# Patient Record
Sex: Female | Born: 1950 | Race: White | Hispanic: No | Marital: Single | Smoking: Former smoker
Health system: Southern US, Community
[De-identification: ages and names within clinical notes are randomized; demographics above are authoritative.]

## PROBLEM LIST (undated history)

## (undated) DIAGNOSIS — K219 Gastro-esophageal reflux disease without esophagitis: Secondary | ICD-10-CM

## (undated) DIAGNOSIS — G8929 Other chronic pain: Secondary | ICD-10-CM

## (undated) DIAGNOSIS — G43909 Migraine, unspecified, not intractable, without status migrainosus: Secondary | ICD-10-CM

## (undated) DIAGNOSIS — M1990 Unspecified osteoarthritis, unspecified site: Secondary | ICD-10-CM

## (undated) DIAGNOSIS — E7800 Pure hypercholesterolemia, unspecified: Secondary | ICD-10-CM

## (undated) DIAGNOSIS — M549 Dorsalgia, unspecified: Secondary | ICD-10-CM

## (undated) DIAGNOSIS — I219 Acute myocardial infarction, unspecified: Secondary | ICD-10-CM

## (undated) DIAGNOSIS — I639 Cerebral infarction, unspecified: Secondary | ICD-10-CM

## (undated) DIAGNOSIS — I509 Heart failure, unspecified: Secondary | ICD-10-CM

## (undated) DIAGNOSIS — F419 Anxiety disorder, unspecified: Secondary | ICD-10-CM

## (undated) DIAGNOSIS — J449 Chronic obstructive pulmonary disease, unspecified: Secondary | ICD-10-CM

## (undated) DIAGNOSIS — I10 Essential (primary) hypertension: Secondary | ICD-10-CM

## (undated) DIAGNOSIS — E119 Type 2 diabetes mellitus without complications: Secondary | ICD-10-CM

## (undated) DIAGNOSIS — N189 Chronic kidney disease, unspecified: Secondary | ICD-10-CM

## (undated) DIAGNOSIS — I2510 Atherosclerotic heart disease of native coronary artery without angina pectoris: Secondary | ICD-10-CM

## (undated) DIAGNOSIS — F329 Major depressive disorder, single episode, unspecified: Secondary | ICD-10-CM

## (undated) DIAGNOSIS — F32A Depression, unspecified: Secondary | ICD-10-CM

## (undated) HISTORY — PX: FOOT SURGERY: SHX648

## (undated) HISTORY — DX: Essential (primary) hypertension: I10

## (undated) HISTORY — PX: ABDOMINAL HYSTERECTOMY: SHX81

## (undated) HISTORY — DX: Major depressive disorder, single episode, unspecified: F32.9

## (undated) HISTORY — PX: CHOLECYSTECTOMY: SHX55

## (undated) HISTORY — PX: BREAST SURGERY: SHX581

## (undated) HISTORY — DX: Other chronic pain: G89.29

## (undated) HISTORY — DX: Chronic obstructive pulmonary disease, unspecified: J44.9

## (undated) HISTORY — PX: OTHER SURGICAL HISTORY: SHX169

## (undated) HISTORY — DX: Depression, unspecified: F32.A

## (undated) HISTORY — DX: Anxiety disorder, unspecified: F41.9

## (undated) HISTORY — DX: Unspecified osteoarthritis, unspecified site: M19.90

## (undated) HISTORY — DX: Dorsalgia, unspecified: M54.9

---

## 1997-08-18 ENCOUNTER — Inpatient Hospital Stay (HOSPITAL_COMMUNITY): Admission: AD | Admit: 1997-08-18 | Discharge: 1997-08-19 | Payer: Self-pay | Admitting: Cardiology

## 2000-08-04 ENCOUNTER — Emergency Department (HOSPITAL_COMMUNITY): Admission: EM | Admit: 2000-08-04 | Discharge: 2000-08-04 | Payer: Self-pay | Admitting: *Deleted

## 2000-08-04 ENCOUNTER — Encounter: Payer: Self-pay | Admitting: *Deleted

## 2000-09-20 ENCOUNTER — Encounter: Payer: Self-pay | Admitting: General Surgery

## 2000-09-20 ENCOUNTER — Ambulatory Visit (HOSPITAL_COMMUNITY): Admission: RE | Admit: 2000-09-20 | Discharge: 2000-09-20 | Payer: Self-pay | Admitting: General Surgery

## 2001-08-02 ENCOUNTER — Ambulatory Visit (HOSPITAL_COMMUNITY): Admission: RE | Admit: 2001-08-02 | Discharge: 2001-08-02 | Payer: Self-pay | Admitting: Family Medicine

## 2001-08-02 ENCOUNTER — Encounter: Payer: Self-pay | Admitting: Family Medicine

## 2001-08-16 ENCOUNTER — Ambulatory Visit (HOSPITAL_COMMUNITY): Admission: RE | Admit: 2001-08-16 | Discharge: 2001-08-16 | Payer: Self-pay | Admitting: Family Medicine

## 2001-08-31 ENCOUNTER — Emergency Department (HOSPITAL_COMMUNITY): Admission: EM | Admit: 2001-08-31 | Discharge: 2001-08-31 | Payer: Self-pay | Admitting: Emergency Medicine

## 2001-08-31 ENCOUNTER — Encounter: Payer: Self-pay | Admitting: Emergency Medicine

## 2014-07-01 ENCOUNTER — Encounter (HOSPITAL_COMMUNITY): Payer: Self-pay | Admitting: Psychiatry

## 2014-07-01 ENCOUNTER — Ambulatory Visit (INDEPENDENT_AMBULATORY_CARE_PROVIDER_SITE_OTHER): Payer: MEDICAID | Admitting: Psychiatry

## 2014-07-01 VITALS — BP 126/94 | HR 68 | Ht 61.5 in | Wt 177.6 lb

## 2014-07-01 DIAGNOSIS — F4310 Post-traumatic stress disorder, unspecified: Secondary | ICD-10-CM

## 2014-07-01 DIAGNOSIS — F321 Major depressive disorder, single episode, moderate: Secondary | ICD-10-CM

## 2014-07-01 DIAGNOSIS — F332 Major depressive disorder, recurrent severe without psychotic features: Secondary | ICD-10-CM

## 2014-07-01 DIAGNOSIS — F329 Major depressive disorder, single episode, unspecified: Secondary | ICD-10-CM | POA: Insufficient documentation

## 2014-07-01 MED ORDER — DULOXETINE HCL 60 MG PO CPEP: 60.0000 mg | ORAL_CAPSULE | Freq: Every day | ORAL | Status: DC

## 2014-07-01 MED ORDER — ALPRAZOLAM 1 MG PO TABS: 1.0000 mg | ORAL_TABLET | Freq: Two times a day (BID) | ORAL | Status: DC

## 2014-07-01 MED ORDER — TRAZODONE HCL 100 MG PO TABS: 100.0000 mg | ORAL_TABLET | Freq: Every day | ORAL | Status: DC

## 2014-07-01 MED ORDER — ARIPIPRAZOLE 10 MG PO TABS: 10.0000 mg | ORAL_TABLET | Freq: Every day | ORAL | Status: DC
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
## 2014-07-28 ENCOUNTER — Ambulatory Visit (HOSPITAL_COMMUNITY): Payer: Self-pay | Admitting: Psychiatry

## 2014-07-29 ENCOUNTER — Ambulatory Visit (INDEPENDENT_AMBULATORY_CARE_PROVIDER_SITE_OTHER): Payer: Medicare Other | Admitting: Psychiatry

## 2014-07-29 ENCOUNTER — Encounter (HOSPITAL_COMMUNITY): Payer: Self-pay | Admitting: Psychiatry

## 2014-07-29 VITALS — BP 114/77 | HR 75 | Ht 61.5 in | Wt 181.0 lb

## 2014-07-29 DIAGNOSIS — F4310 Post-traumatic stress disorder, unspecified: Secondary | ICD-10-CM

## 2014-07-29 DIAGNOSIS — F332 Major depressive disorder, recurrent severe without psychotic features: Secondary | ICD-10-CM

## 2014-07-29 DIAGNOSIS — F321 Major depressive disorder, single episode, moderate: Secondary | ICD-10-CM

## 2014-07-29 MED ORDER — PRAZOSIN HCL 5 MG PO CAPS: 5.0000 mg | ORAL_CAPSULE | Freq: Every day | ORAL | Status: DC
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
## 2014-08-12 ENCOUNTER — Ambulatory Visit (HOSPITAL_COMMUNITY): Payer: Self-pay | Admitting: Psychiatry

## 2014-08-13 ENCOUNTER — Encounter (HOSPITAL_COMMUNITY): Payer: Self-pay | Admitting: *Deleted

## 2014-08-26 ENCOUNTER — Encounter (HOSPITAL_COMMUNITY): Payer: Self-pay | Admitting: Psychiatry

## 2014-08-26 ENCOUNTER — Ambulatory Visit (INDEPENDENT_AMBULATORY_CARE_PROVIDER_SITE_OTHER): Payer: MEDICAID | Admitting: Psychiatry

## 2014-08-26 VITALS — BP 106/74 | HR 49 | Ht 61.5 in | Wt 177.8 lb

## 2014-08-26 DIAGNOSIS — F321 Major depressive disorder, single episode, moderate: Secondary | ICD-10-CM

## 2014-08-26 DIAGNOSIS — F332 Major depressive disorder, recurrent severe without psychotic features: Secondary | ICD-10-CM

## 2014-08-26 DIAGNOSIS — F4310 Post-traumatic stress disorder, unspecified: Secondary | ICD-10-CM

## 2014-08-26 MED ORDER — FLUOXETINE HCL 20 MG PO CAPS: 20.0000 mg | ORAL_CAPSULE | Freq: Every day | ORAL | Status: DC

## 2014-08-26 MED ORDER — ARIPIPRAZOLE 10 MG PO TABS: 10.0000 mg | ORAL_TABLET | Freq: Every day | ORAL | Status: DC

## 2014-08-26 MED ORDER — ALPRAZOLAM 1 MG PO TABS: 1.0000 mg | ORAL_TABLET | Freq: Two times a day (BID) | ORAL | Status: DC

## 2014-08-26 MED ORDER — PRAZOSIN HCL 2 MG PO CAPS: 2.0000 mg | ORAL_CAPSULE | Freq: Every day | ORAL | Status: DC

## 2014-08-26 MED ORDER — TRAZODONE HCL 100 MG PO TABS: 100.0000 mg | ORAL_TABLET | Freq: Every day | ORAL | Status: DC
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
## 2014-09-03 ENCOUNTER — Ambulatory Visit (HOSPITAL_COMMUNITY): Payer: Self-pay | Admitting: Psychiatry

## 2014-09-14 ENCOUNTER — Ambulatory Visit (HOSPITAL_COMMUNITY): Payer: Self-pay | Admitting: Psychiatry

## 2014-09-23 ENCOUNTER — Ambulatory Visit (HOSPITAL_COMMUNITY): Payer: Self-pay | Admitting: Psychiatry

## 2014-10-01 ENCOUNTER — Ambulatory Visit (INDEPENDENT_AMBULATORY_CARE_PROVIDER_SITE_OTHER): Payer: Medicare Other | Admitting: Psychiatry

## 2014-10-01 ENCOUNTER — Encounter (HOSPITAL_COMMUNITY): Payer: Self-pay | Admitting: Psychiatry

## 2014-10-01 VITALS — BP 92/64 | Ht 61.0 in | Wt 177.0 lb

## 2014-10-01 DIAGNOSIS — F4310 Post-traumatic stress disorder, unspecified: Secondary | ICD-10-CM | POA: Diagnosis not present

## 2014-10-01 DIAGNOSIS — F321 Major depressive disorder, single episode, moderate: Secondary | ICD-10-CM

## 2014-10-01 DIAGNOSIS — F332 Major depressive disorder, recurrent severe without psychotic features: Secondary | ICD-10-CM

## 2014-10-01 MED ORDER — FLUOXETINE HCL 20 MG PO CAPS: 20.0000 mg | ORAL_CAPSULE | Freq: Every day | ORAL | Status: DC

## 2014-10-01 MED ORDER — ALPRAZOLAM 1 MG PO TABS: 1.0000 mg | ORAL_TABLET | Freq: Two times a day (BID) | ORAL | Status: DC

## 2014-10-01 MED ORDER — PRAZOSIN HCL 2 MG PO CAPS: 2.0000 mg | ORAL_CAPSULE | Freq: Every day | ORAL | Status: DC

## 2014-10-01 MED ORDER — ARIPIPRAZOLE 10 MG PO TABS: 10.0000 mg | ORAL_TABLET | Freq: Every day | ORAL | Status: DC

## 2014-10-01 MED ORDER — TRAZODONE HCL 100 MG PO TABS: 100.0000 mg | ORAL_TABLET | Freq: Every day | ORAL | Status: DC
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
## 2014-12-01 ENCOUNTER — Ambulatory Visit (INDEPENDENT_AMBULATORY_CARE_PROVIDER_SITE_OTHER): Payer: Medicare Other | Admitting: Psychiatry

## 2014-12-01 ENCOUNTER — Encounter (HOSPITAL_COMMUNITY): Payer: Self-pay | Admitting: Psychiatry

## 2014-12-01 VITALS — BP 113/86 | HR 68 | Ht 61.0 in | Wt 174.4 lb

## 2014-12-01 DIAGNOSIS — F4310 Post-traumatic stress disorder, unspecified: Secondary | ICD-10-CM | POA: Diagnosis not present

## 2014-12-01 DIAGNOSIS — F332 Major depressive disorder, recurrent severe without psychotic features: Secondary | ICD-10-CM

## 2014-12-01 DIAGNOSIS — F321 Major depressive disorder, single episode, moderate: Secondary | ICD-10-CM

## 2014-12-01 MED ORDER — TRAZODONE HCL 100 MG PO TABS: 100.0000 mg | ORAL_TABLET | Freq: Every day | ORAL | Status: DC

## 2014-12-01 MED ORDER — ALPRAZOLAM 1 MG PO TABS: 1.0000 mg | ORAL_TABLET | Freq: Two times a day (BID) | ORAL | Status: DC

## 2014-12-01 MED ORDER — PRAZOSIN HCL 2 MG PO CAPS: 2.0000 mg | ORAL_CAPSULE | Freq: Every day | ORAL | Status: DC

## 2014-12-01 MED ORDER — FLUOXETINE HCL 20 MG PO CAPS: 20.0000 mg | ORAL_CAPSULE | Freq: Every day | ORAL | Status: DC

## 2014-12-01 MED ORDER — ARIPIPRAZOLE 10 MG PO TABS: 10.0000 mg | ORAL_TABLET | Freq: Every day | ORAL | Status: DC
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
## 2015-02-16 ENCOUNTER — Encounter (HOSPITAL_COMMUNITY): Payer: Self-pay | Admitting: Psychiatry

## 2015-02-16 ENCOUNTER — Ambulatory Visit (INDEPENDENT_AMBULATORY_CARE_PROVIDER_SITE_OTHER): Payer: Medicare Other | Admitting: Psychiatry

## 2015-02-16 VITALS — BP 157/87 | HR 65 | Ht 61.0 in | Wt 171.4 lb

## 2015-02-16 DIAGNOSIS — F321 Major depressive disorder, single episode, moderate: Secondary | ICD-10-CM

## 2015-02-16 DIAGNOSIS — F4310 Post-traumatic stress disorder, unspecified: Secondary | ICD-10-CM

## 2015-02-16 MED ORDER — PRAZOSIN HCL 2 MG PO CAPS: 2.0000 mg | ORAL_CAPSULE | Freq: Every day | ORAL | Status: DC

## 2015-02-16 MED ORDER — ARIPIPRAZOLE 10 MG PO TABS: 10.0000 mg | ORAL_TABLET | Freq: Every day | ORAL | Status: DC

## 2015-02-16 MED ORDER — TRAZODONE HCL 100 MG PO TABS: 100.0000 mg | ORAL_TABLET | Freq: Every day | ORAL | Status: DC

## 2015-02-16 MED ORDER — FLUOXETINE HCL 20 MG PO CAPS: 20.0000 mg | ORAL_CAPSULE | Freq: Every day | ORAL | Status: DC

## 2015-02-16 MED ORDER — ALPRAZOLAM 1 MG PO TABS: 1.0000 mg | ORAL_TABLET | Freq: Two times a day (BID) | ORAL | Status: DC
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
## 2015-02-24 ENCOUNTER — Ambulatory Visit (HOSPITAL_COMMUNITY): Payer: Self-pay | Admitting: Psychiatry

## 2015-02-24 ENCOUNTER — Other Ambulatory Visit: Payer: Self-pay | Admitting: Urology

## 2015-02-24 ENCOUNTER — Ambulatory Visit (INDEPENDENT_AMBULATORY_CARE_PROVIDER_SITE_OTHER): Payer: Medicare Other | Admitting: Urology

## 2015-02-24 DIAGNOSIS — R32 Unspecified urinary incontinence: Secondary | ICD-10-CM

## 2015-02-24 DIAGNOSIS — N3281 Overactive bladder: Secondary | ICD-10-CM

## 2015-03-01 ENCOUNTER — Emergency Department (HOSPITAL_COMMUNITY): Payer: Medicare Other

## 2015-03-01 ENCOUNTER — Encounter (HOSPITAL_COMMUNITY): Payer: Self-pay | Admitting: Emergency Medicine

## 2015-03-01 ENCOUNTER — Emergency Department (HOSPITAL_COMMUNITY)
Admission: EM | Admit: 2015-03-01 | Discharge: 2015-03-01 | Disposition: A | Discharge status: Home / Self Care | Payer: Medicare Other | Attending: Emergency Medicine | Admitting: Emergency Medicine

## 2015-03-01 DIAGNOSIS — M1990 Unspecified osteoarthritis, unspecified site: Secondary | ICD-10-CM | POA: Insufficient documentation

## 2015-03-01 DIAGNOSIS — M545 Low back pain: Secondary | ICD-10-CM | POA: Insufficient documentation

## 2015-03-01 DIAGNOSIS — R350 Frequency of micturition: Secondary | ICD-10-CM | POA: Insufficient documentation

## 2015-03-01 DIAGNOSIS — J449 Chronic obstructive pulmonary disease, unspecified: Secondary | ICD-10-CM | POA: Diagnosis not present

## 2015-03-01 DIAGNOSIS — F419 Anxiety disorder, unspecified: Secondary | ICD-10-CM | POA: Diagnosis not present

## 2015-03-01 DIAGNOSIS — F17210 Nicotine dependence, cigarettes, uncomplicated: Secondary | ICD-10-CM | POA: Diagnosis not present

## 2015-03-01 DIAGNOSIS — R1032 Left lower quadrant pain: Secondary | ICD-10-CM | POA: Insufficient documentation

## 2015-03-01 DIAGNOSIS — R1030 Lower abdominal pain, unspecified: Secondary | ICD-10-CM

## 2015-03-01 DIAGNOSIS — Z79899 Other long term (current) drug therapy: Secondary | ICD-10-CM | POA: Insufficient documentation

## 2015-03-01 DIAGNOSIS — I10 Essential (primary) hypertension: Secondary | ICD-10-CM | POA: Diagnosis not present

## 2015-03-01 DIAGNOSIS — G8929 Other chronic pain: Secondary | ICD-10-CM | POA: Diagnosis not present

## 2015-03-01 DIAGNOSIS — Z880 Allergy status to penicillin: Secondary | ICD-10-CM | POA: Diagnosis not present

## 2015-03-01 DIAGNOSIS — R109 Unspecified abdominal pain: Secondary | ICD-10-CM

## 2015-03-01 DIAGNOSIS — F329 Major depressive disorder, single episode, unspecified: Secondary | ICD-10-CM | POA: Insufficient documentation

## 2015-03-01 DIAGNOSIS — R39198 Other difficulties with micturition: Secondary | ICD-10-CM | POA: Insufficient documentation

## 2015-03-01 LAB — URINALYSIS, ROUTINE W REFLEX MICROSCOPIC
BILIRUBIN URINE: NEGATIVE
GLUCOSE, UA: NEGATIVE mg/dL
Ketones, ur: NEGATIVE mg/dL
Leukocytes, UA: NEGATIVE
Nitrite: NEGATIVE
PROTEIN: NEGATIVE mg/dL
Specific Gravity, Urine: 1.02 (ref 1.005–1.030)
pH: 6 (ref 5.0–8.0)

## 2015-03-01 LAB — URINE MICROSCOPIC-ADD ON

## 2015-03-01 LAB — I-STAT CHEM 8, ED
BUN: 13 mg/dL (ref 6–20)
CALCIUM ION: 1.08 mmol/L — AB (ref 1.13–1.30)
Chloride: 100 mmol/L — ABNORMAL LOW (ref 101–111)
Creatinine, Ser: 0.9 mg/dL (ref 0.44–1.00)
GLUCOSE: 127 mg/dL — AB (ref 65–99)
HCT: 46 % (ref 36.0–46.0)
HEMOGLOBIN: 15.6 g/dL — AB (ref 12.0–15.0)
Potassium: 4 mmol/L (ref 3.5–5.1)
Sodium: 140 mmol/L (ref 135–145)
TCO2: 29 mmol/L (ref 0–100)

## 2015-03-01 LAB — I-STAT CG4 LACTIC ACID, ED: LACTIC ACID, VENOUS: 1.54 mmol/L (ref 0.5–2.0)

## 2015-03-01 MED ORDER — OXYCODONE-ACETAMINOPHEN 5-325 MG PO TABS
1.0000 | ORAL_TABLET | Freq: Once | ORAL | Status: AC
  Administered 2015-03-01: 1 via ORAL
  Filled 2015-03-01: qty 1

## 2015-03-01 MED ORDER — ONDANSETRON 8 MG PO TBDP
8.0000 mg | ORAL_TABLET | Freq: Once | ORAL | Status: AC
  Administered 2015-03-01: 8 mg via ORAL
  Filled 2015-03-01: qty 1

## 2015-03-01 NOTE — ED Provider Notes (Signed)
CSN: [id number]     Arrival date & time 03/01/15  1616 History   First MD Initiated Contact with Patient 03/01/15 1628     Chief Complaint  Patient presents with  . Urinary Frequency    Patient is a 64 y.o. female presenting with frequency. The history is provided by the patient and a relative.  Urinary Frequency This is a new problem. The current episode started more than 2 days ago. The problem occurs daily. The problem has been gradually worsening. Associated symptoms include abdominal pain. Pertinent negatives include no chest pain. Nothing aggravates the symptoms. Nothing relieves the symptoms.  Pt reports recent difficulty urinating over past several weeks  She initially had issues with large quantities of urine, and her PCP referred her to urology She saw urology on 11/23 She had in/out cath per patient which she describes as "difficult" but was able to produce urine.  She was referred for cystoscopy on 11/30 Starting on thanksgiving (11/24) she started having low abd pain and back pain She describes difficulty urinating and only producing small amounts of urine No fever/vomiting but reports nausea   Past Medical History  Diagnosis Date  . COPD (chronic obstructive pulmonary disease) (HCC)   . Arthritis   . Hypertension   . Depression   . Anxiety   . Chronic back pain    Past Surgical History  Procedure Laterality Date  . Abdominal hysterectomy    . Cholecystectomy    . Breast lump removed    . Breast surgery    . Foot surgery     Family History  Problem Relation Age of Onset  . Bipolar disorder Daughter    Social History  Substance Use Topics  . Smoking status: Current Every Day Smoker -- 1.00 packs/day    Types: Cigarettes  . Smokeless tobacco: None  . Alcohol Use: No   OB History    No data available     Review of Systems  Constitutional: Positive for fatigue. Negative for fever.  Cardiovascular: Negative for chest pain.  Gastrointestinal: Positive  for abdominal pain. Negative for vomiting.  Genitourinary: Positive for frequency, flank pain, decreased urine volume and difficulty urinating.  All other systems reviewed and are negative.     Allergies  Penicillins  Home Medications   Prior to Admission medications   Medication Sig Start Date End Date Taking? Authorizing Provider  albuterol (PROVENTIL HFA;VENTOLIN HFA) 108 (90 BASE) MCG/ACT inhaler Inhale 2 puffs into the lungs 2 (two) times daily.    Historical Provider, MD  ALPRAZolam Prudy Feeler) 1 MG tablet Take 1 tablet (1 mg total) by mouth 2 (two) times daily. 02/16/15   Myrlene Broker, MD  ARIPiprazole (ABILIFY) 10 MG tablet Take 1 tablet (10 mg total) by mouth daily. 02/16/15   Myrlene Broker, MD  FLUoxetine (PROZAC) 20 MG capsule Take 1 capsule (20 mg total) by mouth daily. 02/16/15 02/16/16  Myrlene Broker, MD  FLUTICASONE FUROATE-VILANTEROL IN Inhale into the lungs.    Historical Provider, MD  metoprolol succinate (TOPROL-XL) 50 MG 24 hr tablet Take 50 mg by mouth daily. Take with or immediately following a meal.    Historical Provider, MD  omeprazole (PRILOSEC) 40 MG capsule Take 40 mg by mouth daily.    Historical Provider, MD  oxyCODONE-acetaminophen (PERCOCET) 10-325 MG per tablet Take 1 tablet by mouth every 8 (eight) hours as needed for pain.    Historical Provider, MD  potassium chloride SA (K-DUR,KLOR-CON) 20 MEQ tablet Take 20  mEq by mouth 2 (two) times daily.    Historical Provider, MD  pravastatin (PRAVACHOL) 80 MG tablet Take 80 mg by mouth daily.    Historical Provider, MD  prazosin (MINIPRESS) 2 MG capsule Take 1 capsule (2 mg total) by mouth at bedtime. 02/16/15   Myrlene Broker, MD  Tiotropium Bromide Monohydrate 2.5 MCG/ACT AERS Inhale 2 puffs into the lungs daily.    Historical Provider, MD  traZODone (DESYREL) 100 MG tablet Take 1 tablet (100 mg total) by mouth at bedtime. 02/16/15   Myrlene Broker, MD   BP 196/83 mmHg  Pulse 70  Temp(Src) 98 F (36.7 C)  (Oral)  Resp 16  Ht  (1.549 m)  Wt 77.111 kg  BMI 32.14 kg/m2  SpO2 100% Physical Exam CONSTITUTIONAL: Well developed/well nourished HEAD: Normocephalic/atraumatic EYES: EOMI ENMT: Mucous membranes moist NECK: supple no meningeal signs SPINE/BACK:entire spine nontender CV: S1/S2 noted, no murmurs/rubs/gallops noted LUNGS: Lungs are clear to auscultation bilaterally, no apparent distress ABDOMEN: soft, moderate suprapubic tenderness, LLQ tenderness, no rebound or guarding ZO:[id number] cva tenderness NEURO: Pt is awake/alert/appropriate, moves all extremitiesx4.  No facial droop.   EXTREMITIES: pulses normal/equal, full ROM SKIN: warm, color normal PSYCH: no abnormalities of mood noted, alert and oriented to situation  ED Course  Procedures  5:35 PM Family/patient concerned for possible sepsis and infection Labs/urinalysis/bladder scan ordered 7:00 PM Pt with around of urine in bladder She reports difficulty passing urine Foley placed by nursing with passage of urine, but still having diffuse low abd pain and flank pain Will search for other causes of pain 8:09 PM Pt appears improved Urine not c/w UTI CT imaging negative She would like to go home She feels symptoms improved after foley placement After discussion with patient/family, she will keep foley in place She scheduled to see urology in 48 hours I feel this is reasonable to keep catheter in place  Labs Review Labs Reviewed  URINALYSIS, ROUTINE W REFLEX MICROSCOPIC (NOT AT [facility]) - Abnormal; Notable for the following:    Hgb urine dipstick SMALL (*)    All other components within normal limits  URINE MICROSCOPIC-ADD ON - Abnormal; Notable for the following:    Squamous Epithelial / LPF 0-5 (*)    Bacteria, UA RARE (*)    All other components within normal limits  I-STAT CHEM 8, ED - Abnormal; Notable for the following:    Chloride 100 (*)    Glucose, Bld 127 (*)    Calcium, Ion 1.08 (*)     Hemoglobin 15.6 (*)    All other components within normal limits  URINE CULTURE  I-STAT CG4 LACTIC ACID, ED    Imaging Review Ct Renal Stone Study  03/01/2015  CLINICAL DATA:  Urinary frequency and decreased urine output. EXAM: CT ABDOMEN AND PELVIS WITHOUT CONTRAST TECHNIQUE: Multidetector CT imaging of the abdomen and pelvis was performed following the standard protocol without IV contrast. COMPARISON:  09/10/2010. FINDINGS: Lower chest:  No acute findings. Hepatobiliary: Cholecystectomy clips. Normal non contrasted appearance of the liver. Pancreas: No mass or inflammatory process identified on this un-enhanced exam. Spleen: Within normal limits in size. Adrenals/Urinary Tract: Foley catheter in urinary bladder with no urine in the bladder. Normal non contrasted appearance of the kidneys and ureters. No urinary tract calculi or hydronephrosis. Stomach/Bowel: Mild sigmoid and right colon diverticulosis without evidence of diverticulitis. No gastric or small bowel abnormalities. Normal appearing appendix. Vascular/Lymphatic: Atheromatous arterial calcifications. No enlarged lymph nodes. Reproductive: Surgically absent uterus.  No adnexal masses. Other: None. Musculoskeletal: Lower thoracic spine degenerative changes and minimal lumbar spine degenerative changes. IMPRESSION: No acute abnormality. Electronically Signed   By: Beckie Salts M.D.   On: 03/01/2015 19:42   I have personally reviewed and evaluated these  lab results as part of my medical decision-making.   Medications  oxyCODONE-acetaminophen (PERCOCET/ROXICET) 5-325 MG per tablet 1 tablet (1 tablet Oral Given 03/01/15 1837)  ondansetron (ZOFRAN-ODT) disintegrating tablet 8 mg (8 mg Oral Given 03/01/15 1837)    MDM   Final diagnoses:  Urinary frequency  Lower abdominal pain    Nursing notes including past medical history and social history reviewed and considered in documentation Labs/vital reviewed myself and considered during  evaluation     Zadie Rhine, MD 03/01/15 2010

## 2015-03-01 NOTE — ED Notes (Signed)
Changed patient's foley bag to leg bag. Demonstrated instructions on use of leg bag to patient. Patient verbalized understanding.

## 2015-03-01 NOTE — Discharge Instructions (Signed)

## 2015-03-01 NOTE — ED Notes (Signed)
Pt states that she has been having urinary frequency with only a few drops coming out.  Started on 11/24.  Had in and out cath done at doctors's office but was not started on abx.

## 2015-03-01 NOTE — ED Notes (Signed)
Bladder scan showed 161 ml urine in bladder.

## 2015-03-03 ENCOUNTER — Ambulatory Visit (HOSPITAL_COMMUNITY)
Admission: RE | Admit: 2015-03-03 | Discharge: 2015-03-03 | Disposition: A | Discharge status: Home / Self Care | Payer: Medicare Other | Source: Ambulatory Visit | Attending: Urology | Admitting: Urology

## 2015-03-03 DIAGNOSIS — N39498 Other specified urinary incontinence: Secondary | ICD-10-CM | POA: Insufficient documentation

## 2015-03-03 DIAGNOSIS — R1030 Lower abdominal pain, unspecified: Secondary | ICD-10-CM | POA: Diagnosis not present

## 2015-03-03 DIAGNOSIS — M549 Dorsalgia, unspecified: Secondary | ICD-10-CM | POA: Diagnosis not present

## 2015-03-03 DIAGNOSIS — G8929 Other chronic pain: Secondary | ICD-10-CM | POA: Insufficient documentation

## 2015-03-03 DIAGNOSIS — R32 Unspecified urinary incontinence: Secondary | ICD-10-CM

## 2015-03-03 LAB — URINE CULTURE

## 2015-03-17 ENCOUNTER — Ambulatory Visit (INDEPENDENT_AMBULATORY_CARE_PROVIDER_SITE_OTHER): Payer: Medicare Other | Admitting: Urology

## 2015-03-17 DIAGNOSIS — N3281 Overactive bladder: Secondary | ICD-10-CM | POA: Diagnosis not present

## 2015-04-21 ENCOUNTER — Ambulatory Visit: Payer: Self-pay | Admitting: Urology

## 2015-05-19 ENCOUNTER — Ambulatory Visit (INDEPENDENT_AMBULATORY_CARE_PROVIDER_SITE_OTHER): Payer: 59 | Admitting: Psychiatry

## 2015-05-19 ENCOUNTER — Encounter (HOSPITAL_COMMUNITY): Payer: Self-pay | Admitting: Psychiatry

## 2015-05-19 VITALS — BP 173/93 | HR 63 | Ht 61.0 in | Wt 169.2 lb

## 2015-05-19 DIAGNOSIS — F4310 Post-traumatic stress disorder, unspecified: Secondary | ICD-10-CM

## 2015-05-19 DIAGNOSIS — F321 Major depressive disorder, single episode, moderate: Secondary | ICD-10-CM

## 2015-05-19 MED ORDER — ARIPIPRAZOLE 10 MG PO TABS: 10.0000 mg | ORAL_TABLET | Freq: Every day | ORAL | Status: DC

## 2015-05-19 MED ORDER — ALPRAZOLAM 1 MG PO TABS: 1.0000 mg | ORAL_TABLET | Freq: Two times a day (BID) | ORAL | Status: DC

## 2015-05-19 MED ORDER — PRAZOSIN HCL 2 MG PO CAPS: 2.0000 mg | ORAL_CAPSULE | Freq: Every day | ORAL | Status: DC

## 2015-05-19 MED ORDER — FLUOXETINE HCL 20 MG PO CAPS: 20.0000 mg | ORAL_CAPSULE | Freq: Every day | ORAL | Status: DC

## 2015-05-19 MED ORDER — TRAZODONE HCL 50 MG PO TABS: 50.0000 mg | ORAL_TABLET | Freq: Every day | ORAL | Status: DC
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
## 2015-05-26 ENCOUNTER — Ambulatory Visit: Payer: Self-pay | Admitting: Urology

## 2015-06-09 ENCOUNTER — Ambulatory Visit (INDEPENDENT_AMBULATORY_CARE_PROVIDER_SITE_OTHER): Payer: Medicare Other | Admitting: Urology

## 2015-06-09 DIAGNOSIS — R32 Unspecified urinary incontinence: Secondary | ICD-10-CM

## 2015-06-09 DIAGNOSIS — N3281 Overactive bladder: Secondary | ICD-10-CM

## 2015-07-14 ENCOUNTER — Ambulatory Visit (INDEPENDENT_AMBULATORY_CARE_PROVIDER_SITE_OTHER): Payer: Medicare Other | Admitting: Urology

## 2015-07-14 DIAGNOSIS — R32 Unspecified urinary incontinence: Secondary | ICD-10-CM

## 2015-07-14 DIAGNOSIS — N3281 Overactive bladder: Secondary | ICD-10-CM | POA: Diagnosis not present

## 2015-08-11 ENCOUNTER — Telehealth (HOSPITAL_COMMUNITY): Payer: Self-pay | Admitting: *Deleted

## 2015-08-11 NOTE — Telephone Encounter (Signed)
Pt pharmacy [facility] faxed refill request for pt Xanax. Pt medication last printed on 05-19-15 with 2 refills. Pt f/u appt is scheduled for 08-16-15. Pharmacy number is [phone]

## 2015-08-11 NOTE — Telephone Encounter (Signed)
You may call in one month supply 

## 2015-08-12 ENCOUNTER — Telehealth (HOSPITAL_COMMUNITY): Payer: Self-pay | Admitting: *Deleted

## 2015-08-12 MED ORDER — ALPRAZOLAM 1 MG PO TABS: 1.0000 mg | ORAL_TABLET | Freq: Two times a day (BID) | ORAL | Status: DC

## 2015-08-12 NOTE — Telephone Encounter (Signed)
Called pt pharmacy and spoke with Wynona Canes the pharmacist. Informed her provider would like to approve one month of pt Xanax and she showed understanding.

## 2015-08-12 NOTE — Telephone Encounter (Addendum)
Called pt pharmacy due to fax that was received for refill request for pt Xanax. Per provider to call in one months worth into pt pharmacy. Spoke with Wynona Canes at pt pharmacy and she showed understanding

## 2015-08-16 ENCOUNTER — Encounter (HOSPITAL_COMMUNITY): Payer: Self-pay | Admitting: Psychiatry

## 2015-08-16 ENCOUNTER — Ambulatory Visit (INDEPENDENT_AMBULATORY_CARE_PROVIDER_SITE_OTHER): Payer: 59 | Admitting: Psychiatry

## 2015-08-16 VITALS — BP 160/96 | Ht 61.0 in | Wt 171.0 lb

## 2015-08-16 DIAGNOSIS — F4310 Post-traumatic stress disorder, unspecified: Secondary | ICD-10-CM

## 2015-08-16 DIAGNOSIS — F321 Major depressive disorder, single episode, moderate: Secondary | ICD-10-CM

## 2015-08-16 MED ORDER — FLUOXETINE HCL 20 MG PO CAPS: 20.0000 mg | ORAL_CAPSULE | Freq: Every day | ORAL | Status: DC

## 2015-08-16 MED ORDER — ARIPIPRAZOLE 10 MG PO TABS: 10.0000 mg | ORAL_TABLET | Freq: Every day | ORAL | Status: DC

## 2015-08-16 MED ORDER — ALPRAZOLAM 1 MG PO TABS: 1.0000 mg | ORAL_TABLET | Freq: Two times a day (BID) | ORAL | Status: DC

## 2015-08-16 MED ORDER — PRAZOSIN HCL 5 MG PO CAPS: 5.0000 mg | ORAL_CAPSULE | Freq: Every day | ORAL | Status: DC

## 2015-08-16 MED ORDER — TRAZODONE HCL 50 MG PO TABS: 50.0000 mg | ORAL_TABLET | Freq: Every day | ORAL | Status: DC
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
## 2015-09-01 ENCOUNTER — Other Ambulatory Visit (HOSPITAL_COMMUNITY): Payer: Self-pay | Admitting: Psychiatry

## 2015-09-01 ENCOUNTER — Telehealth (HOSPITAL_COMMUNITY): Payer: Self-pay | Admitting: *Deleted

## 2015-09-01 MED ORDER — PRAZOSIN HCL 2 MG PO CAPS: 2.0000 mg | ORAL_CAPSULE | Freq: Every day | ORAL | Status: DC

## 2015-09-01 NOTE — Telephone Encounter (Signed)
2 mg dose sent in

## 2015-09-01 NOTE — Telephone Encounter (Signed)
patient said her Prazosin HCL is a little too strong.  she can't wake up in the middle of the night to go to bathroom, she wets the bed.

## 2015-10-07 ENCOUNTER — Telehealth (HOSPITAL_COMMUNITY): Payer: Self-pay | Admitting: *Deleted

## 2015-10-07 NOTE — Telephone Encounter (Signed)
Office received faxed refill request from [facility] for pt alprazolam 1 mg BID. Per pt chart, medication was last printed on 08-16-15 with 2 refills. [facility] pharmacy fax number is [phone] and phone number is [phone].

## 2015-10-08 NOTE — Telephone Encounter (Signed)
Please call them and see if pt provided the script to them. If not, you may call it in

## 2015-10-08 NOTE — Telephone Encounter (Signed)
Called pt to see if she sent her Xanax script to her pharmacy and she stated yes she did. Asked pt if she can remember if when she sent it to her pharmacy and she stated she can not remember.

## 2015-10-15 ENCOUNTER — Ambulatory Visit (HOSPITAL_COMMUNITY): Payer: Self-pay | Admitting: Psychiatry

## 2015-10-18 ENCOUNTER — Encounter (HOSPITAL_COMMUNITY): Payer: Self-pay | Admitting: Psychiatry

## 2015-10-18 ENCOUNTER — Ambulatory Visit (INDEPENDENT_AMBULATORY_CARE_PROVIDER_SITE_OTHER): Payer: 59 | Admitting: Psychiatry

## 2015-10-18 VITALS — BP 168/96 | HR 67 | Ht 61.0 in | Wt 165.8 lb

## 2015-10-18 DIAGNOSIS — F321 Major depressive disorder, single episode, moderate: Secondary | ICD-10-CM

## 2015-10-18 MED ORDER — ALPRAZOLAM 1 MG PO TABS: 1.0000 mg | ORAL_TABLET | Freq: Two times a day (BID) | ORAL | Status: DC

## 2015-10-18 MED ORDER — ARIPIPRAZOLE 10 MG PO TABS: 10.0000 mg | ORAL_TABLET | Freq: Every day | ORAL | Status: DC

## 2015-10-18 MED ORDER — PRAZOSIN HCL 2 MG PO CAPS: 2.0000 mg | ORAL_CAPSULE | Freq: Every day | ORAL | Status: DC

## 2015-10-18 MED ORDER — TRAZODONE HCL 50 MG PO TABS: 50.0000 mg | ORAL_TABLET | Freq: Every day | ORAL | Status: DC

## 2015-10-18 MED ORDER — FLUOXETINE HCL 20 MG PO CAPS: 20.0000 mg | ORAL_CAPSULE | Freq: Every day | ORAL | Status: DC
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
## 2016-01-18 ENCOUNTER — Encounter (HOSPITAL_COMMUNITY): Payer: Self-pay | Admitting: Psychiatry

## 2016-01-18 ENCOUNTER — Ambulatory Visit (INDEPENDENT_AMBULATORY_CARE_PROVIDER_SITE_OTHER): Payer: 59 | Admitting: Psychiatry

## 2016-01-18 VITALS — BP 142/68 | HR 46 | Ht 61.0 in | Wt 165.8 lb

## 2016-01-18 DIAGNOSIS — Z818 Family history of other mental and behavioral disorders: Secondary | ICD-10-CM

## 2016-01-18 DIAGNOSIS — F321 Major depressive disorder, single episode, moderate: Secondary | ICD-10-CM

## 2016-01-18 DIAGNOSIS — F4310 Post-traumatic stress disorder, unspecified: Secondary | ICD-10-CM

## 2016-01-18 MED ORDER — PRAZOSIN HCL 2 MG PO CAPS: 2.0000 mg | ORAL_CAPSULE | Freq: Every day | ORAL | 2 refills | Status: DC

## 2016-01-18 MED ORDER — TRAZODONE HCL 50 MG PO TABS: 50.0000 mg | ORAL_TABLET | Freq: Every day | ORAL | 2 refills | Status: DC

## 2016-01-18 MED ORDER — ALPRAZOLAM 1 MG PO TABS: 1.0000 mg | ORAL_TABLET | Freq: Two times a day (BID) | ORAL | 2 refills | Status: DC

## 2016-01-18 MED ORDER — ARIPIPRAZOLE 10 MG PO TABS: 10.0000 mg | ORAL_TABLET | Freq: Every day | ORAL | 2 refills | Status: DC

## 2016-01-18 MED ORDER — FLUOXETINE HCL 20 MG PO CAPS: 20.0000 mg | ORAL_CAPSULE | Freq: Every day | ORAL | 2 refills | Status: DC
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
## 2016-01-19 ENCOUNTER — Telehealth (HOSPITAL_COMMUNITY): Payer: Self-pay | Admitting: *Deleted

## 2016-01-19 NOTE — Telephone Encounter (Signed)
Called Dr Sherryll Burger office at [facility] per Dr. Tenny Craw to inform them of pt HR. Spoke with Marcelino Duster which stated she will relay the message to Dr.Shah and his nurse. Informed Marcelino Duster that Dr. Tenny Craw wanted Dr. Sherryll Burger to be aware that pt HR was 45 when she came to office yesterday for f/u. Marcelino Duster verbalized understanding.

## 2016-04-06 ENCOUNTER — Ambulatory Visit (HOSPITAL_COMMUNITY)
Admission: RE | Admit: 2016-04-06 | Discharge: 2016-04-06 | Disposition: A | Discharge status: Home / Self Care | Payer: Medicare Other | Source: Ambulatory Visit | Attending: Neurology | Admitting: Neurology

## 2016-04-06 ENCOUNTER — Other Ambulatory Visit (HOSPITAL_COMMUNITY): Payer: Self-pay | Admitting: Neurology

## 2016-04-06 DIAGNOSIS — M542 Cervicalgia: Secondary | ICD-10-CM | POA: Diagnosis present

## 2016-04-06 DIAGNOSIS — M545 Low back pain: Secondary | ICD-10-CM

## 2016-04-19 ENCOUNTER — Ambulatory Visit (HOSPITAL_COMMUNITY): Payer: Self-pay | Admitting: Psychiatry

## 2016-05-01 ENCOUNTER — Encounter (HOSPITAL_COMMUNITY): Payer: Self-pay | Admitting: Psychiatry

## 2016-05-01 ENCOUNTER — Telehealth (HOSPITAL_COMMUNITY): Payer: Self-pay | Admitting: *Deleted

## 2016-05-01 ENCOUNTER — Ambulatory Visit (INDEPENDENT_AMBULATORY_CARE_PROVIDER_SITE_OTHER): Payer: 59 | Admitting: Psychiatry

## 2016-05-01 VITALS — BP 142/93 | HR 50 | Ht 61.0 in | Wt 169.6 lb

## 2016-05-01 DIAGNOSIS — Z79899 Other long term (current) drug therapy: Secondary | ICD-10-CM | POA: Diagnosis not present

## 2016-05-01 DIAGNOSIS — F4310 Post-traumatic stress disorder, unspecified: Secondary | ICD-10-CM | POA: Diagnosis not present

## 2016-05-01 DIAGNOSIS — Z880 Allergy status to penicillin: Secondary | ICD-10-CM

## 2016-05-01 DIAGNOSIS — F321 Major depressive disorder, single episode, moderate: Secondary | ICD-10-CM

## 2016-05-01 DIAGNOSIS — Z818 Family history of other mental and behavioral disorders: Secondary | ICD-10-CM

## 2016-05-01 MED ORDER — ALPRAZOLAM 1 MG PO TABS: 1.0000 mg | ORAL_TABLET | Freq: Two times a day (BID) | ORAL | 2 refills | Status: DC

## 2016-05-01 MED ORDER — FLUOXETINE HCL 20 MG PO CAPS: 20.0000 mg | ORAL_CAPSULE | Freq: Every day | ORAL | 2 refills | Status: DC

## 2016-05-01 MED ORDER — ARIPIPRAZOLE 10 MG PO TABS: 10.0000 mg | ORAL_TABLET | Freq: Every day | ORAL | 2 refills | Status: DC

## 2016-05-01 MED ORDER — TRAZODONE HCL 50 MG PO TABS: 50.0000 mg | ORAL_TABLET | Freq: Every day | ORAL | 2 refills | Status: DC

## 2016-05-01 MED ORDER — PRAZOSIN HCL 2 MG PO CAPS: 2.0000 mg | ORAL_CAPSULE | Freq: Every day | ORAL | 2 refills | Status: DC
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
## 2016-05-01 NOTE — Telephone Encounter (Signed)
Per verbal from Dr. Tenny Craw to call Dr. Sherryll Burger office in ref. To pt visit today. Per Dr. Tenny Craw pt is unable to raise her head and appeared very sleepy. Per Dr. Tenny Craw, she thinks pt Metoprolol XL is too much for her due to her sleeping so much and pt stated that she was not taking her Plavix and informed provider that she do not know why she was not taking. Called Dr. Sherryll Burger office and spoke with Dr. Sherryll Burger. Dr. Sherryll Burger verified that pt is taking Xanax and Trazodone and wanted to see if office did a drug test on pt. Informed Dr.Shah with information and transferred call to Dr. Tenny Craw to speak with him. Dr. Tenny Craw came back to staff informing them to call pt and tell her to STOP her Trazodone and Metoprolol due to what she and Dr. Sherryll Burger discussed. Per Dr. Tenny Craw, to also informed pt to call Dr. Sherryll Burger office as soon as possible to sch an appt with them. Called pt to inform her with information. Called picked up and she stated her name is Carollee Herter pt daughter. Office provided [employer] with number and name for pt to call back. Carollee Herter verbalized understanding.

## 2016-06-26 ENCOUNTER — Ambulatory Visit (HOSPITAL_COMMUNITY): Payer: Self-pay | Admitting: Psychiatry

## 2016-06-27 ENCOUNTER — Telehealth (HOSPITAL_COMMUNITY): Payer: Self-pay | Admitting: *Deleted

## 2016-06-27 ENCOUNTER — Encounter (HOSPITAL_COMMUNITY): Payer: Self-pay | Admitting: Psychiatry

## 2016-06-27 ENCOUNTER — Ambulatory Visit (INDEPENDENT_AMBULATORY_CARE_PROVIDER_SITE_OTHER): Payer: 59 | Admitting: Psychiatry

## 2016-06-27 VITALS — BP 118/85 | HR 56 | Ht 61.0 in | Wt 168.8 lb

## 2016-06-27 DIAGNOSIS — F4310 Post-traumatic stress disorder, unspecified: Secondary | ICD-10-CM | POA: Diagnosis not present

## 2016-06-27 DIAGNOSIS — Z79891 Long term (current) use of opiate analgesic: Secondary | ICD-10-CM | POA: Diagnosis not present

## 2016-06-27 DIAGNOSIS — Z79899 Other long term (current) drug therapy: Secondary | ICD-10-CM | POA: Diagnosis not present

## 2016-06-27 DIAGNOSIS — Z880 Allergy status to penicillin: Secondary | ICD-10-CM | POA: Diagnosis not present

## 2016-06-27 DIAGNOSIS — F321 Major depressive disorder, single episode, moderate: Secondary | ICD-10-CM | POA: Diagnosis not present

## 2016-06-27 MED ORDER — FLUOXETINE HCL 20 MG PO CAPS: 20.0000 mg | ORAL_CAPSULE | Freq: Every day | ORAL | 2 refills | Status: DC

## 2016-06-27 MED ORDER — ARIPIPRAZOLE 10 MG PO TABS: 10.0000 mg | ORAL_TABLET | Freq: Every day | ORAL | 2 refills | Status: DC

## 2016-06-27 MED ORDER — PRAZOSIN HCL 2 MG PO CAPS: 2.0000 mg | ORAL_CAPSULE | Freq: Every day | ORAL | 2 refills | Status: DC

## 2016-06-27 MED ORDER — ALPRAZOLAM 1 MG PO TABS: 1.0000 mg | ORAL_TABLET | Freq: Two times a day (BID) | ORAL | 2 refills | Status: DC

## 2016-06-27 NOTE — Telephone Encounter (Signed)
Per previous note on file, RMA called pt PCP to get correct dosage for pt Toprol XL. Per pt PCP office, pt is taking half of 50 mg which comes up to 25 mg QD. Informed Dr. Tenny Craw and she stated for RMA to go ahead and change it in pt chart. Dosage for pt medication was changed in there chart.

## 2016-06-27 NOTE — Telephone Encounter (Signed)
Ok, please change dosage in her record

## 2016-06-27 NOTE — Telephone Encounter (Signed)
Per verbal from Dr. Tenny Craw to call doctor Sherryll Burger office. Per provider, pt is still unclear about the mg and her heart rate is still a bit low. Per Dr. Tenny Craw she would like to know the mg of pt Metoprolol. Called Dr. Sherryll Burger office and spoke with Inetta Fermo. Per Inetta Fermo, pt is taking 25 mg. Per Inetta Fermo, when pt came in for f/u, her HR was 60 bpm. Per Inetta Fermo if Dr. Tenny Craw thinks pt is having any problems, they will sch pt to f/u with Dr. Sherryll Burger.

## 2016-06-27 NOTE — Telephone Encounter (Signed)
Medication was changed in pt chart
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
## 2016-09-06 ENCOUNTER — Encounter: Payer: Self-pay | Admitting: *Deleted

## 2016-09-07 ENCOUNTER — Encounter: Payer: Self-pay | Admitting: Cardiology

## 2016-09-07 ENCOUNTER — Ambulatory Visit (INDEPENDENT_AMBULATORY_CARE_PROVIDER_SITE_OTHER): Payer: Medicare Other | Admitting: Cardiology

## 2016-09-07 VITALS — BP 106/64 | HR 57 | Ht 62.0 in | Wt 177.0 lb

## 2016-09-07 DIAGNOSIS — I5033 Acute on chronic diastolic (congestive) heart failure: Secondary | ICD-10-CM

## 2016-09-07 DIAGNOSIS — R0602 Shortness of breath: Secondary | ICD-10-CM

## 2016-09-07 MED ORDER — FUROSEMIDE 40 MG PO TABS: 60.0000 mg | ORAL_TABLET | Freq: Every day | ORAL | 1 refills | Status: DC

## 2016-09-07 NOTE — Progress Notes (Signed)
Clinical Summary Mariah White is a 66 y.o.female seen as new patient, referred by Dr Sherryll Burger for SOB.   1. SOB - started 1 year ago. Can occur at rest or with activity. Can have some coughing or wheezing. Can have some chest tightness at times. Midchest, 4/10 in severity. Tightness can occur at rest or with activity. Can be positional. Lasts a few minutes - has had some recent LE edema. Started about 4 months. Started on lasix a few months ago by - 08/2016 echo: LVEF >75%, abnormal diastolic function per report. Normal LA, normal RV. Regarding diastolic function E/A 0.9, lateral e' 9cm, E/e' 8, decel time not reported, no normal LA.  - not improved with lasix.   2. COPD - followed by pcp  Past Medical History:  Diagnosis Date  . Anxiety   . Arthritis   . Chronic back pain   . COPD (chronic obstructive pulmonary disease) (HCC)   . Depression   . Hypertension      Allergies  Allergen Reactions  . Penicillins Itching     Current Outpatient Prescriptions  Medication Sig Dispense Refill  . albuterol (PROVENTIL HFA;VENTOLIN HFA) 108 (90 BASE) MCG/ACT inhaler Inhale 2 puffs into the lungs 2 (two) times daily.    Marland Kitchen ALPRAZolam (XANAX) 1 MG tablet Take 1 tablet (1 mg total) by mouth 2 (two) times daily. 60 tablet 2  . ARIPiprazole (ABILIFY) 10 MG tablet Take 1 tablet (10 mg total) by mouth daily. 30 tablet 2  . FLUoxetine (PROZAC) 20 MG capsule Take 1 capsule (20 mg total) by mouth daily. 30 capsule 2  . FLUTICASONE FUROATE-VILANTEROL IN Inhale into the lungs.    . gabapentin (NEURONTIN) 300 MG capsule Take 300 mg by mouth 2 (two) times daily.    . metoprolol succinate (TOPROL-XL) 25 MG 24 hr tablet Take 25 mg by mouth daily.    . Omega-3 Fatty Acids (FISH OIL) 1000 MG CAPS Take by mouth 2 (two) times daily.    Marland Kitchen omeprazole (PRILOSEC) 40 MG capsule Take 40 mg by mouth daily.    Marland Kitchen oxyCODONE-acetaminophen (PERCOCET/ROXICET) 5-325 MG tablet Take 1-2 tablets by mouth every 4 (four) hours  as needed for severe pain.    . potassium chloride SA (K-DUR,KLOR-CON) 20 MEQ tablet Take 20 mEq by mouth 2 (two) times daily. Reported on 10/18/2015    . pravastatin (PRAVACHOL) 80 MG tablet Take 80 mg by mouth daily.    . prazosin (MINIPRESS) 2 MG capsule Take 1 capsule (2 mg total) by mouth at bedtime. 30 capsule 2  . Tiotropium Bromide Monohydrate 2.5 MCG/ACT AERS Inhale 2 puffs into the lungs daily.     No current facility-administered medications for this visit.      Past Surgical History:  Procedure Laterality Date  . ABDOMINAL HYSTERECTOMY    . breast lump removed    . BREAST SURGERY    . CHOLECYSTECTOMY    . FOOT SURGERY       Allergies  Allergen Reactions  . Penicillins Itching      Family History  Problem Relation Age of Onset  . Bipolar disorder Daughter      Social History Mariah White reports that she has been smoking Cigarettes.  She has been smoking about 1.00 pack per day. She has never used smokeless tobacco. Mariah White reports that she does not drink alcohol.   Review of Systems CONSTITUTIONAL: No weight loss, fever, chills, weakness or fatigue.  HEENT: Eyes: No visual loss,  blurred vision, double vision or yellow sclerae.No hearing loss, sneezing, congestion, runny nose or sore throat.  SKIN: No rash or itching.  CARDIOVASCULAR: per hpi RESPIRATORY: per hpi GASTROINTESTINAL: No anorexia, nausea, vomiting or diarrhea. No abdominal pain or blood.  GENITOURINARY: No burning on urination, no polyuria NEUROLOGICAL: No headache, dizziness, syncope, paralysis, ataxia, numbness or tingling in the extremities. No change in bowel or bladder control.  MUSCULOSKELETAL: No muscle, back pain, joint pain or stiffness.  LYMPHATICS: No enlarged nodes. No history of splenectomy.  PSYCHIATRIC: No history of depression or anxiety.  ENDOCRINOLOGIC: No reports of sweating, cold or heat intolerance. No polyuria or polydipsia.  Marland Kitchen   Physical Examination Vitals:    09/07/16 1340  BP: 106/64  Pulse: (!) 57   Vitals:   09/07/16 1340  Weight: 177 lb (80.3 kg)  Height: 5\' 2"  (1.575 m)    Gen: resting comfortably, no acute distress HEENT: no scleral icterus, pupils equal round and reactive, no palptable cervical adenopathy,  CV" RRR, no m/r/g. JVD to angle of jaw Resp: bilateral crackles GI: abdomen is soft, non-tender, non-distended, normal bowel sounds, no hepatosplenomegaly MSK: extremities are warm, no edema.  Skin: warm, no rash Neuro:  no focal deficits Psych: appropriate affect      Assessment and Plan  1. Acute on chronic diastolic heart failure - evidence of fluid overload by exam - will increase lasix to 60mg  daily, check BMET/Mg in 2 weeks       Antoine Poche, M.D.

## 2016-09-07 NOTE — Patient Instructions (Addendum)
Your physician recommends that you schedule a follow-up appointment in: 3 WEEKS WITH DR [facility]  Your physician has recommended you make the following change in your medication:   INCREASE LASIX 60 MG (1-1/2 TABLETS) DAILY   Your physician recommends that you return for lab work NOW AND IN 2 WEEKS BMP/MG  Thank you for choosing [facility]!!

## 2016-09-20 ENCOUNTER — Ambulatory Visit (INDEPENDENT_AMBULATORY_CARE_PROVIDER_SITE_OTHER): Payer: 59 | Admitting: Psychiatry

## 2016-09-20 ENCOUNTER — Encounter (HOSPITAL_COMMUNITY): Payer: Self-pay | Admitting: Psychiatry

## 2016-09-20 VITALS — BP 110/60 | HR 49 | Ht 62.0 in | Wt 177.0 lb

## 2016-09-20 DIAGNOSIS — F321 Major depressive disorder, single episode, moderate: Secondary | ICD-10-CM | POA: Diagnosis not present

## 2016-09-20 DIAGNOSIS — F4310 Post-traumatic stress disorder, unspecified: Secondary | ICD-10-CM | POA: Diagnosis not present

## 2016-09-20 MED ORDER — ALPRAZOLAM 1 MG PO TABS: 1.0000 mg | ORAL_TABLET | Freq: Two times a day (BID) | ORAL | 2 refills | Status: DC

## 2016-09-20 MED ORDER — ARIPIPRAZOLE 5 MG PO TABS: 5.0000 mg | ORAL_TABLET | Freq: Every day | ORAL | 2 refills | Status: DC

## 2016-09-20 MED ORDER — FLUOXETINE HCL 20 MG PO CAPS: 20.0000 mg | ORAL_CAPSULE | Freq: Every day | ORAL | 2 refills | Status: DC

## 2016-09-20 MED ORDER — PRAZOSIN HCL 2 MG PO CAPS: 2.0000 mg | ORAL_CAPSULE | Freq: Every day | ORAL | 2 refills | Status: DC
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
## 2016-09-22 ENCOUNTER — Telehealth: Payer: Self-pay

## 2016-09-22 MED ORDER — MAGNESIUM OXIDE -MG SUPPLEMENT 400 (240 MG) MG PO TABS: 400.0000 mg | ORAL_TABLET | Freq: Every day | ORAL | 3 refills | Status: DC

## 2016-09-22 NOTE — Telephone Encounter (Signed)
-----   Message from Antoine Poche, MD sent at 09/12/2016  1:21 PM EDT ----- Labs look good other than magnesium being mildly low, start magnesium oxide 400mg  bid x 3 days, then 400mg  daily  Dominga Ferry MD

## 2016-09-22 NOTE — Telephone Encounter (Signed)
Patients phone has been disconnected. Attempted to call daughter in law and son as well. No answer. Letter has been mailed to patient

## 2016-09-25 ENCOUNTER — Ambulatory Visit (HOSPITAL_COMMUNITY): Payer: Self-pay | Admitting: Psychiatry

## 2016-10-06 ENCOUNTER — Ambulatory Visit (INDEPENDENT_AMBULATORY_CARE_PROVIDER_SITE_OTHER): Payer: Medicare Other | Admitting: Cardiology

## 2016-10-06 VITALS — BP 122/68 | HR 60 | Ht 62.0 in | Wt 180.0 lb

## 2016-10-06 DIAGNOSIS — I5033 Acute on chronic diastolic (congestive) heart failure: Secondary | ICD-10-CM

## 2016-10-06 DIAGNOSIS — R0602 Shortness of breath: Secondary | ICD-10-CM | POA: Diagnosis not present

## 2016-10-06 NOTE — Patient Instructions (Signed)
Your physician recommends that you schedule a follow-up appointment in: 1 MONTH WITH DR [facility]  Your physician recommends that you continue on your current medications as directed. Please refer to the Current Medication list given to you today.  Your physician recommends that you return for lab work BMP/MG  UPDATE [place] IN 1 WEEK ON YOUR WEIGHTS  Thank you for choosing [facility]!!

## 2016-10-06 NOTE — Progress Notes (Signed)
Clinical Summary Mariah White is a 66 y.o.female seen today for follow up of the following medical problems.   1. SOB - started 1 year ago. Can occur at rest or with activity. Can have some coughing or wheezing. Can have some chest tightness at times. Midchest, 4/10 in severity. Tightness can occur at rest or with activity. Can be positional. Lasts a few minutes - has had some recent LE edema. Started about 4 months. Started on lasix a few months ago by - 08/2016 echo: LVEF >75%, abnormal diastolic function per report. Normal LA, normal RV. Regarding diastolic function E/A 0.9, lateral e' 9cm, E/e' 8, decel time not reported, no normal LA.  - not improved with lasix.   - last visti increased lasix to 60mg  daily. Repeat labs showed low Mg, started on replacment.  - increaesd SOB over the last several days  2. COPD - followed by pcp -takes advair daily, albuterol prn Past Medical History:  Diagnosis Date  . Anxiety   . Arthritis   . Chronic back pain   . COPD (chronic obstructive pulmonary disease) (HCC)   . Depression   . Hypertension      Allergies  Allergen Reactions  . Penicillins Itching     Current Outpatient Prescriptions  Medication Sig Dispense Refill  . albuterol (PROVENTIL HFA;VENTOLIN HFA) 108 (90 BASE) MCG/ACT inhaler Inhale 2 puffs into the lungs 2 (two) times daily.    Marland Kitchen ALPRAZolam (XANAX) 1 MG tablet Take 1 tablet (1 mg total) by mouth 2 (two) times daily. 60 tablet 2  . amLODipine (NORVASC) 5 MG tablet Take 5 mg by mouth daily.    . ARIPiprazole (ABILIFY) 5 MG tablet Take 1 tablet (5 mg total) by mouth at bedtime. 30 tablet 2  . aspirin EC 81 MG tablet Take 81 mg by mouth daily.    . Calcium Carb-Cholecalciferol (CALCIUM 600-D PO) Take 1 tablet by mouth daily.    . clopidogrel (PLAVIX) 75 MG tablet Take 75 mg by mouth daily.    Marland Kitchen FLUoxetine (PROZAC) 20 MG capsule Take 1 capsule (20 mg total) by mouth daily. 30 capsule 2  . furosemide (LASIX) 40 MG tablet  Take 1.5 tablets (60 mg total) by mouth daily. 135 tablet 1  . gabapentin (NEURONTIN) 300 MG capsule Take 300 mg by mouth 2 (two) times daily.    Marland Kitchen lisinopril (PRINIVIL,ZESTRIL) 40 MG tablet Take 40 mg by mouth daily.    . Magnesium Oxide 400 (240 Mg) MG TABS Take 1 tablet (400 mg total) by mouth daily. Please take 1 tablet (400 mg) twice daily for 3 days then take 1 tablet 400 mg once daily 30 tablet 3  . metoprolol succinate (TOPROL-XL) 50 MG 24 hr tablet Take 25 mg by mouth daily. Take with or immediately following a meal.    . Omega-3 Fatty Acids (FISH OIL) 1000 MG CAPS Take by mouth 2 (two) times daily.    Marland Kitchen omeprazole (PRILOSEC) 40 MG capsule Take 40 mg by mouth daily.    Marland Kitchen oxyCODONE-acetaminophen (PERCOCET/ROXICET) 5-325 MG tablet Take 1-2 tablets by mouth every 4 (four) hours as needed for severe pain.    . pravastatin (PRAVACHOL) 80 MG tablet Take 80 mg by mouth daily.    . prazosin (MINIPRESS) 2 MG capsule Take 1 capsule (2 mg total) by mouth at bedtime. 30 capsule 2   No current facility-administered medications for this visit.      Past Surgical History:  Procedure Laterality Date  .  ABDOMINAL HYSTERECTOMY    . breast lump removed    . BREAST SURGERY    . CHOLECYSTECTOMY    . FOOT SURGERY       Allergies  Allergen Reactions  . Penicillins Itching      Family History  Problem Relation Age of Onset  . Bipolar disorder Daughter      Social History Mariah White reports that she has been smoking Cigarettes.  She has been smoking about 1.00 pack per day. She has never used smokeless tobacco. Mariah White reports that she does not drink alcohol.   Review of Systems CONSTITUTIONAL: No weight loss, fever, chills, weakness or fatigue.  HEENT: Eyes: No visual loss, blurred vision, double vision or yellow sclerae.No hearing loss, sneezing, congestion, runny nose or sore throat.  SKIN: No rash or itching.  CARDIOVASCULAR: per hpi RESPIRATORY: per hpi  GASTROINTESTINAL: No  anorexia, nausea, vomiting or diarrhea. No abdominal pain or blood.  GENITOURINARY: No burning on urination, no polyuria NEUROLOGICAL: No headache, dizziness, syncope, paralysis, ataxia, numbness or tingling in the extremities. No change in bowel or bladder control.  MUSCULOSKELETAL: No muscle, back pain, joint pain or stiffness.  LYMPHATICS: No enlarged nodes. No history of splenectomy.  PSYCHIATRIC: No history of depression or anxiety.  ENDOCRINOLOGIC: No reports of sweating, cold or heat intolerance. No polyuria or polydipsia.  Marland Kitchen   Physical Examination Vitals:   10/06/16 1328  BP: 122/68  Pulse: 60   Vitals:   10/06/16 1328  Weight: 180 lb (81.6 kg)  Height: 5\' 2"  (1.575 m)    Gen: resting comfortably, no acute distress HEENT: no scleral icterus, pupils equal round and reactive, no palptable cervical adenopathy,  CV: RRR, no m/r/g, no jvd Resp: Clear to auscultation bilaterally GI: abdomen is soft, non-tender, non-distended, normal bowel sounds, no hepatosplenomegaly MSK: extremities are warm, 2+ bilateral LE edema Skin: warm, no rash Neuro:  no focal deficits Psych: appropriate affect     Assessment and Plan  1. Acute on chronic diastolic heart failure - remains volume overloaded.  - appears she did not increase her lasix to 60mg  daily last visit, she will make change and update [place] in 1 week on her weights. Check BMET/Mg in 1 week     Antoine Poche, M.D.

## 2016-10-17 ENCOUNTER — Telehealth: Payer: Self-pay | Admitting: *Deleted

## 2016-10-17 NOTE — Telephone Encounter (Signed)
Pt aware - says she has weighed daily for the last 5 days and has remained at 180lbs

## 2016-10-17 NOTE — Telephone Encounter (Signed)
-----   Message from Antoine Poche, MD sent at 10/17/2016 12:59 PM EDT ----- Labs look good, how are her weights doing  Dominga Ferry MD

## 2016-10-19 NOTE — Telephone Encounter (Signed)
Pt voiced understanding - will update [place] on Monday - routed to pcp

## 2016-10-19 NOTE — Telephone Encounter (Signed)
Increase lasix to 60mg  in AM and 40mg  in PM, update [place] on Monday on her weights   Dominga Ferry MD

## 2016-11-13 ENCOUNTER — Encounter (HOSPITAL_COMMUNITY): Payer: Self-pay | Admitting: Psychiatry

## 2016-11-13 ENCOUNTER — Ambulatory Visit (INDEPENDENT_AMBULATORY_CARE_PROVIDER_SITE_OTHER): Payer: Medicare Other | Admitting: Psychiatry

## 2016-11-13 VITALS — BP 120/80 | Ht 62.0 in | Wt 177.0 lb

## 2016-11-13 DIAGNOSIS — F321 Major depressive disorder, single episode, moderate: Secondary | ICD-10-CM | POA: Diagnosis not present

## 2016-11-13 DIAGNOSIS — F4310 Post-traumatic stress disorder, unspecified: Secondary | ICD-10-CM | POA: Diagnosis not present

## 2016-11-13 DIAGNOSIS — G479 Sleep disorder, unspecified: Secondary | ICD-10-CM | POA: Diagnosis not present

## 2016-11-13 DIAGNOSIS — Z818 Family history of other mental and behavioral disorders: Secondary | ICD-10-CM

## 2016-11-13 MED ORDER — FLUOXETINE HCL 20 MG PO CAPS: 20.0000 mg | ORAL_CAPSULE | Freq: Every day | ORAL | 2 refills | Status: DC

## 2016-11-13 MED ORDER — ARIPIPRAZOLE 5 MG PO TABS: 5.0000 mg | ORAL_TABLET | Freq: Every day | ORAL | 2 refills | Status: DC

## 2016-11-13 MED ORDER — PRAZOSIN HCL 2 MG PO CAPS: 2.0000 mg | ORAL_CAPSULE | Freq: Every day | ORAL | 2 refills | Status: DC

## 2016-11-13 MED ORDER — ALPRAZOLAM 1 MG PO TABS: 1.0000 mg | ORAL_TABLET | Freq: Two times a day (BID) | ORAL | 2 refills | Status: DC
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
## 2016-11-23 ENCOUNTER — Ambulatory Visit (INDEPENDENT_AMBULATORY_CARE_PROVIDER_SITE_OTHER): Payer: Medicare Other | Admitting: Cardiology

## 2016-11-23 ENCOUNTER — Encounter: Payer: Self-pay | Admitting: Cardiology

## 2016-11-23 VITALS — BP 112/70 | HR 50 | Ht 62.0 in | Wt 178.0 lb

## 2016-11-23 DIAGNOSIS — R0602 Shortness of breath: Secondary | ICD-10-CM | POA: Diagnosis not present

## 2016-11-23 DIAGNOSIS — I5033 Acute on chronic diastolic (congestive) heart failure: Secondary | ICD-10-CM

## 2016-11-23 NOTE — Patient Instructions (Signed)
Your physician wants you to follow-up in: 6 MONTHS WITH DR [facility] You will receive a reminder letter in the mail two months in advance. If you don't receive a letter, please call our office to schedule the follow-up appointment.   Your physician has recommended you make the following change in your medication:   STOP ASPIRIN   Your physician recommends that you return for lab work BMP/MG  Thank you for choosing [facility]!!

## 2016-11-23 NOTE — Progress Notes (Signed)
Clinical Summary Mariah White is a 66 y.o.female seen today for follow up of the following medical problems.   1. SOB/Acute on chronic sysotlic HF - started 1 year ago. Can occur at rest or with activity. Can have some coughing or wheezing. Can have some chest tightness at times. Midchest, 4/10 in severity. Tightness can occur at rest or with activity. Can be positional. Lasts a few minutes - has had some recent LE edema. Started about 4 months. Started on lasix a few months ago by - 08/2016 echo: LVEF >75%, abnormal diastolic function per report. Normal LA, normal RV. Regarding diastolic function E/A 0.9, lateral e' 9cm, E/e' 8, decel time not reported, no normal LA.  - not improved with lasix.   - we recently increased lasix to 60mg  in AM and 40mg  in PM.  - home weights around 176-177 lbs. Edema is improving, still some SOB   2. COPD - followed by pcp -takes advair daily, albuterol prn  3. History of CVA - she has been on ASA and plavix Past Medical History:  Diagnosis Date  . Anxiety   . Arthritis   . Chronic back pain   . COPD (chronic obstructive pulmonary disease) (HCC)   . Depression   . Hypertension      Allergies  Allergen Reactions  . Penicillins Itching     Current Outpatient Prescriptions  Medication Sig Dispense Refill  . albuterol (PROVENTIL HFA;VENTOLIN HFA) 108 (90 BASE) MCG/ACT inhaler Inhale 2 puffs into the lungs 2 (two) times daily.    Marland Kitchen ALPRAZolam (XANAX) 1 MG tablet Take 1 tablet (1 mg total) by mouth 2 (two) times daily. 60 tablet 2  . amLODipine (NORVASC) 5 MG tablet Take 5 mg by mouth daily.    . ARIPiprazole (ABILIFY) 5 MG tablet Take 1 tablet (5 mg total) by mouth at bedtime. 30 tablet 2  . aspirin EC 81 MG tablet Take 81 mg by mouth daily.    . Calcium Carb-Cholecalciferol (CALCIUM 600-D PO) Take 1 tablet by mouth daily.    . clopidogrel (PLAVIX) 75 MG tablet Take 75 mg by mouth daily.    Marland Kitchen FLUoxetine (PROZAC) 20 MG capsule Take 1  capsule (20 mg total) by mouth daily. 30 capsule 2  . fluticasone-salmeterol (ADVAIR HFA) 115-21 MCG/ACT inhaler Inhale 2 puffs into the lungs 2 (two) times daily.    . furosemide (LASIX) 40 MG tablet Take 1.5 tablets (60 mg total) by mouth daily. 135 tablet 1  . gabapentin (NEURONTIN) 300 MG capsule Take 300 mg by mouth 2 (two) times daily.    Marland Kitchen lisinopril (PRINIVIL,ZESTRIL) 40 MG tablet Take 40 mg by mouth daily.    . Magnesium Oxide 400 (240 Mg) MG TABS Take 1 tablet (400 mg total) by mouth daily. Please take 1 tablet (400 mg) twice daily for 3 days then take 1 tablet 400 mg once daily 30 tablet 3  . metoprolol succinate (TOPROL-XL) 50 MG 24 hr tablet Take 25 mg by mouth daily. Take with or immediately following a meal.    . Omega-3 Fatty Acids (FISH OIL) 1000 MG CAPS Take by mouth 2 (two) times daily.    Marland Kitchen omeprazole (PRILOSEC) 40 MG capsule Take 40 mg by mouth daily.    Marland Kitchen oxyCODONE-acetaminophen (PERCOCET/ROXICET) 5-325 MG tablet Take 1-2 tablets by mouth every 4 (four) hours as needed for severe pain.    . pravastatin (PRAVACHOL) 80 MG tablet Take 80 mg by mouth daily.    Marland Kitchen  prazosin (MINIPRESS) 2 MG capsule Take 1 capsule (2 mg total) by mouth at bedtime. 30 capsule 2   No current facility-administered medications for this visit.      Past Surgical History:  Procedure Laterality Date  . ABDOMINAL HYSTERECTOMY    . breast lump removed    . BREAST SURGERY    . CHOLECYSTECTOMY    . FOOT SURGERY       Allergies  Allergen Reactions  . Penicillins Itching      Family History  Problem Relation Age of Onset  . Bipolar disorder Daughter      Social History Mariah White reports that she has been smoking Cigarettes.  She has been smoking about 1.00 pack per day. She has never used smokeless tobacco. Mariah White reports that she does not drink alcohol.   Review of Systems CONSTITUTIONAL: No weight loss, fever, chills, weakness or fatigue.  HEENT: Eyes: No visual loss, blurred  vision, double vision or yellow sclerae.No hearing loss, sneezing, congestion, runny nose or sore throat.  SKIN: No rash or itching.  CARDIOVASCULAR: per hpi RESPIRATORY:per hpi  GASTROINTESTINAL: No anorexia, nausea, vomiting or diarrhea. No abdominal pain or blood.  GENITOURINARY: No burning on urination, no polyuria NEUROLOGICAL: No headache, dizziness, syncope, paralysis, ataxia, numbness or tingling in the extremities. No change in bowel or bladder control.  MUSCULOSKELETAL: No muscle, back pain, joint pain or stiffness.  LYMPHATICS: No enlarged nodes. No history of splenectomy.  PSYCHIATRIC: No history of depression or anxiety.  ENDOCRINOLOGIC: No reports of sweating, cold or heat intolerance. No polyuria or polydipsia.  Marland Kitchen   Physical Examination Vitals:   11/23/16 1016  BP: 112/70  Pulse: (!) 50  SpO2: 94%   Vitals:   11/23/16 1016  Weight: 178 lb (80.7 kg)  Height: 5\' 2"  (1.575 m)    Gen: resting comfortably, no acute distress HEENT: no scleral icterus, pupils equal round and reactive, no palptable cervical adenopathy,  CV: RRR, no m/r/g, no jvd Resp: Clear to auscultation bilaterally GI: abdomen is soft, non-tender, non-distended, normal bowel sounds, no hepatosplenomegaly MSK: extremities are warm, no edema.  Skin: warm, no rash Neuro:  no focal deficits Psych: appropriate affect    Assessment and Plan   1. Acute on chronic diastolic heart failure - edema has improved, we will continue diuretics. Check BMET/Mg   F/u 6 months     Antoine Poche, M.D., F.A.C.C.

## 2017-02-06 ENCOUNTER — Encounter (HOSPITAL_COMMUNITY): Payer: Self-pay | Admitting: Psychiatry

## 2017-02-06 ENCOUNTER — Ambulatory Visit (INDEPENDENT_AMBULATORY_CARE_PROVIDER_SITE_OTHER): Payer: Medicare Other | Admitting: Psychiatry

## 2017-02-06 VITALS — BP 130/80 | HR 88 | Wt 181.0 lb

## 2017-02-06 DIAGNOSIS — Z736 Limitation of activities due to disability: Secondary | ICD-10-CM

## 2017-02-06 DIAGNOSIS — F321 Major depressive disorder, single episode, moderate: Secondary | ICD-10-CM | POA: Diagnosis not present

## 2017-02-06 DIAGNOSIS — F419 Anxiety disorder, unspecified: Secondary | ICD-10-CM | POA: Diagnosis not present

## 2017-02-06 DIAGNOSIS — Z91410 Personal history of adult physical and sexual abuse: Secondary | ICD-10-CM | POA: Diagnosis not present

## 2017-02-06 DIAGNOSIS — M549 Dorsalgia, unspecified: Secondary | ICD-10-CM | POA: Diagnosis not present

## 2017-02-06 DIAGNOSIS — R450 Nervousness: Secondary | ICD-10-CM

## 2017-02-06 DIAGNOSIS — F17210 Nicotine dependence, cigarettes, uncomplicated: Secondary | ICD-10-CM | POA: Diagnosis not present

## 2017-02-06 DIAGNOSIS — Z91411 Personal history of adult psychological abuse: Secondary | ICD-10-CM

## 2017-02-06 DIAGNOSIS — R51 Headache: Secondary | ICD-10-CM | POA: Diagnosis not present

## 2017-02-06 DIAGNOSIS — Z818 Family history of other mental and behavioral disorders: Secondary | ICD-10-CM

## 2017-02-06 MED ORDER — ARIPIPRAZOLE 5 MG PO TABS: 5.0000 mg | ORAL_TABLET | Freq: Every day | ORAL | 2 refills | Status: DC

## 2017-02-06 MED ORDER — ALPRAZOLAM 1 MG PO TABS
1.0000 mg | ORAL_TABLET | Freq: Two times a day (BID) | ORAL | 2 refills | Status: DC
Start: 2017-02-06 — End: 2017-05-17

## 2017-02-06 MED ORDER — PRAZOSIN HCL 2 MG PO CAPS: 2.0000 mg | ORAL_CAPSULE | Freq: Every day | ORAL | 2 refills | Status: DC

## 2017-02-06 MED ORDER — FLUOXETINE HCL 20 MG PO CAPS: 20.0000 mg | ORAL_CAPSULE | Freq: Every day | ORAL | 2 refills | Status: DC
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
## 2017-02-27 ENCOUNTER — Other Ambulatory Visit: Payer: Self-pay | Admitting: *Deleted

## 2017-02-27 MED ORDER — MAGNESIUM OXIDE -MG SUPPLEMENT 400 (240 MG) MG PO TABS: 400.0000 mg | ORAL_TABLET | Freq: Every day | ORAL | 6 refills | Status: DC

## 2017-03-19 ENCOUNTER — Other Ambulatory Visit: Payer: Self-pay | Admitting: Cardiology

## 2017-04-19 ENCOUNTER — Other Ambulatory Visit (HOSPITAL_COMMUNITY): Payer: Self-pay | Admitting: Psychiatry

## 2017-05-03 ENCOUNTER — Emergency Department (HOSPITAL_COMMUNITY): Payer: Medicare Other

## 2017-05-03 ENCOUNTER — Inpatient Hospital Stay (HOSPITAL_COMMUNITY)
Admission: EM | Admit: 2017-05-03 | Discharge: 2017-05-17 | DRG: 871 | Disposition: A | Discharge status: Skilled Nursing Facility | Payer: Medicare Other | Attending: Family Medicine | Admitting: Family Medicine

## 2017-05-03 ENCOUNTER — Other Ambulatory Visit: Payer: Self-pay

## 2017-05-03 ENCOUNTER — Encounter (HOSPITAL_COMMUNITY): Payer: Self-pay | Admitting: Emergency Medicine

## 2017-05-03 DIAGNOSIS — N179 Acute kidney failure, unspecified: Secondary | ICD-10-CM | POA: Diagnosis present

## 2017-05-03 DIAGNOSIS — Z79899 Other long term (current) drug therapy: Secondary | ICD-10-CM

## 2017-05-03 DIAGNOSIS — A419 Sepsis, unspecified organism: Secondary | ICD-10-CM | POA: Diagnosis present

## 2017-05-03 DIAGNOSIS — J9622 Acute and chronic respiratory failure with hypercapnia: Secondary | ICD-10-CM | POA: Diagnosis present

## 2017-05-03 DIAGNOSIS — I509 Heart failure, unspecified: Secondary | ICD-10-CM

## 2017-05-03 DIAGNOSIS — J9601 Acute respiratory failure with hypoxia: Secondary | ICD-10-CM | POA: Diagnosis not present

## 2017-05-03 DIAGNOSIS — J441 Chronic obstructive pulmonary disease with (acute) exacerbation: Secondary | ICD-10-CM | POA: Diagnosis present

## 2017-05-03 DIAGNOSIS — E440 Moderate protein-calorie malnutrition: Secondary | ICD-10-CM | POA: Diagnosis present

## 2017-05-03 DIAGNOSIS — R29810 Facial weakness: Secondary | ICD-10-CM | POA: Diagnosis present

## 2017-05-03 DIAGNOSIS — J189 Pneumonia, unspecified organism: Secondary | ICD-10-CM | POA: Diagnosis not present

## 2017-05-03 DIAGNOSIS — Z880 Allergy status to penicillin: Secondary | ICD-10-CM

## 2017-05-03 DIAGNOSIS — K219 Gastro-esophageal reflux disease without esophagitis: Secondary | ICD-10-CM | POA: Diagnosis present

## 2017-05-03 DIAGNOSIS — I10 Essential (primary) hypertension: Secondary | ICD-10-CM

## 2017-05-03 DIAGNOSIS — I959 Hypotension, unspecified: Secondary | ICD-10-CM | POA: Diagnosis present

## 2017-05-03 DIAGNOSIS — Z7902 Long term (current) use of antithrombotics/antiplatelets: Secondary | ICD-10-CM | POA: Diagnosis not present

## 2017-05-03 DIAGNOSIS — R6520 Severe sepsis without septic shock: Secondary | ICD-10-CM | POA: Diagnosis present

## 2017-05-03 DIAGNOSIS — E860 Dehydration: Secondary | ICD-10-CM

## 2017-05-03 DIAGNOSIS — I5032 Chronic diastolic (congestive) heart failure: Secondary | ICD-10-CM | POA: Diagnosis present

## 2017-05-03 DIAGNOSIS — R1310 Dysphagia, unspecified: Secondary | ICD-10-CM | POA: Diagnosis present

## 2017-05-03 DIAGNOSIS — J449 Chronic obstructive pulmonary disease, unspecified: Secondary | ICD-10-CM

## 2017-05-03 DIAGNOSIS — I2510 Atherosclerotic heart disease of native coronary artery without angina pectoris: Secondary | ICD-10-CM | POA: Diagnosis present

## 2017-05-03 DIAGNOSIS — G92 Toxic encephalopathy: Secondary | ICD-10-CM | POA: Diagnosis present

## 2017-05-03 DIAGNOSIS — I110 Hypertensive heart disease with heart failure: Secondary | ICD-10-CM | POA: Diagnosis present

## 2017-05-03 DIAGNOSIS — R112 Nausea with vomiting, unspecified: Secondary | ICD-10-CM | POA: Diagnosis not present

## 2017-05-03 DIAGNOSIS — G8929 Other chronic pain: Secondary | ICD-10-CM | POA: Diagnosis present

## 2017-05-03 DIAGNOSIS — M549 Dorsalgia, unspecified: Secondary | ICD-10-CM | POA: Diagnosis present

## 2017-05-03 DIAGNOSIS — R410 Disorientation, unspecified: Secondary | ICD-10-CM | POA: Diagnosis not present

## 2017-05-03 DIAGNOSIS — R0902 Hypoxemia: Secondary | ICD-10-CM | POA: Diagnosis not present

## 2017-05-03 DIAGNOSIS — Z8673 Personal history of transient ischemic attack (TIA), and cerebral infarction without residual deficits: Secondary | ICD-10-CM | POA: Diagnosis not present

## 2017-05-03 DIAGNOSIS — R4182 Altered mental status, unspecified: Secondary | ICD-10-CM | POA: Diagnosis present

## 2017-05-03 DIAGNOSIS — J690 Pneumonitis due to inhalation of food and vomit: Secondary | ICD-10-CM | POA: Diagnosis present

## 2017-05-03 DIAGNOSIS — G9341 Metabolic encephalopathy: Secondary | ICD-10-CM | POA: Diagnosis not present

## 2017-05-03 DIAGNOSIS — R0602 Shortness of breath: Secondary | ICD-10-CM

## 2017-05-03 DIAGNOSIS — Z7951 Long term (current) use of inhaled steroids: Secondary | ICD-10-CM | POA: Diagnosis not present

## 2017-05-03 DIAGNOSIS — J9621 Acute and chronic respiratory failure with hypoxia: Secondary | ICD-10-CM | POA: Diagnosis present

## 2017-05-03 DIAGNOSIS — I639 Cerebral infarction, unspecified: Secondary | ICD-10-CM

## 2017-05-03 HISTORY — DX: Heart failure, unspecified: I50.9

## 2017-05-03 HISTORY — DX: Migraine, unspecified, not intractable, without status migrainosus: G43.909

## 2017-05-03 HISTORY — DX: Atherosclerotic heart disease of native coronary artery without angina pectoris: I25.10

## 2017-05-03 HISTORY — DX: Cerebral infarction, unspecified: I63.9

## 2017-05-03 HISTORY — DX: Other chronic pain: G89.29

## 2017-05-03 HISTORY — DX: Gastro-esophageal reflux disease without esophagitis: K21.9

## 2017-05-03 LAB — COMPREHENSIVE METABOLIC PANEL
ALBUMIN: 3.2 g/dL — AB (ref 3.5–5.0)
ALT: 28 U/L (ref 14–54)
AST: 47 U/L — AB (ref 15–41)
Alkaline Phosphatase: 115 U/L (ref 38–126)
Anion gap: 10 (ref 5–15)
BUN: 39 mg/dL — AB (ref 6–20)
CHLORIDE: 107 mmol/L (ref 101–111)
CO2: 22 mmol/L (ref 22–32)
CREATININE: 1.96 mg/dL — AB (ref 0.44–1.00)
Calcium: 8.8 mg/dL — ABNORMAL LOW (ref 8.9–10.3)
GFR calc Af Amer: 29 mL/min — ABNORMAL LOW (ref >60)
GFR, EST NON AFRICAN AMERICAN: 25 mL/min — AB (ref >60)
Glucose, Bld: 93 mg/dL (ref 65–99)
Potassium: 5.8 mmol/L — ABNORMAL HIGH (ref 3.5–5.1)
SODIUM: 139 mmol/L (ref 135–145)
Total Bilirubin: 0.7 mg/dL (ref 0.3–1.2)
Total Protein: 6.9 g/dL (ref 6.5–8.1)

## 2017-05-03 LAB — CBC WITH DIFFERENTIAL/PLATELET
Basophils Absolute: 0 10*3/uL (ref 0.0–0.1)
Basophils Relative: 0 %
EOS PCT: 0 %
Eosinophils Absolute: 0 10*3/uL (ref 0.0–0.7)
HEMATOCRIT: 42 % (ref 36.0–46.0)
Hemoglobin: 12.8 g/dL (ref 12.0–15.0)
LYMPHS ABS: 1.3 10*3/uL (ref 0.7–4.0)
LYMPHS PCT: 10 %
MCH: 29 pg (ref 26.0–34.0)
MCHC: 30.5 g/dL (ref 30.0–36.0)
MCV: 95 fL (ref 78.0–100.0)
MONOS PCT: 5 %
Monocytes Absolute: 0.7 10*3/uL (ref 0.1–1.0)
Neutro Abs: 11.3 10*3/uL (ref 1.7–7.7)
Neutrophils Relative %: 85 %
Platelets: 201 10*3/uL (ref 150–400)
RBC: 4.42 MIL/uL (ref 3.87–5.11)
RDW: 14.7 % (ref 11.5–15.5)
WBC: 13.4 10*3/uL — AB (ref 4.0–10.5)

## 2017-05-03 LAB — TROPONIN I: Troponin I: <0.03 ng/mL (ref <0.03)

## 2017-05-03 LAB — URINALYSIS, ROUTINE W REFLEX MICROSCOPIC
BILIRUBIN URINE: NEGATIVE
GLUCOSE, UA: NEGATIVE mg/dL
Hgb urine dipstick: NEGATIVE
KETONES UR: NEGATIVE mg/dL
Leukocytes, UA: NEGATIVE
Nitrite: NEGATIVE
PH: 5 (ref 5.0–8.0)
Protein, ur: NEGATIVE mg/dL
Specific Gravity, Urine: 1.014 (ref 1.005–1.030)

## 2017-05-03 LAB — BRAIN NATRIURETIC PEPTIDE: B Natriuretic Peptide: 202 pg/mL — ABNORMAL HIGH (ref 0.0–100.0)

## 2017-05-03 LAB — I-STAT CG4 LACTIC ACID, ED: LACTIC ACID, VENOUS: 1.29 mmol/L (ref 0.5–1.9)

## 2017-05-03 MED ORDER — IPRATROPIUM-ALBUTEROL 0.5-2.5 (3) MG/3ML IN SOLN
3.0000 mL | Freq: Four times a day (QID) | RESPIRATORY_TRACT | Status: DC
  Administered 2017-05-03 – 2017-05-05 (×7): 3 mL via RESPIRATORY_TRACT
  Filled 2017-05-03 (×5): qty 3

## 2017-05-03 MED ORDER — DEXTROSE 5 % IV SOLN
INTRAVENOUS | Status: AC
  Filled 2017-05-03: qty 1

## 2017-05-03 MED ORDER — PRAVASTATIN SODIUM 40 MG PO TABS
80.0000 mg | ORAL_TABLET | Freq: Every day | ORAL | Status: DC
  Administered 2017-05-04 – 2017-05-09 (×3): 80 mg via ORAL
  Filled 2017-05-03: qty 2
  Filled 2017-05-03: qty 1
  Filled 2017-05-03 (×2): qty 2

## 2017-05-03 MED ORDER — ARIPIPRAZOLE 5 MG PO TABS
5.0000 mg | ORAL_TABLET | Freq: Every day | ORAL | Status: DC
  Administered 2017-05-03: 5 mg via ORAL
  Filled 2017-05-03 (×2): qty 1

## 2017-05-03 MED ORDER — VANCOMYCIN HCL IN DEXTROSE 1-5 GM/200ML-% IV SOLN: 1000.0000 mg | Freq: Once | INTRAVENOUS | Status: DC

## 2017-05-03 MED ORDER — LEVOFLOXACIN IN D5W 750 MG/150ML IV SOLN
750.0000 mg | Freq: Once | INTRAVENOUS | Status: AC
  Administered 2017-05-03: 750 mg via INTRAVENOUS
  Filled 2017-05-03: qty 150

## 2017-05-03 MED ORDER — ONDANSETRON HCL 4 MG PO TABS
4.0000 mg | ORAL_TABLET | Freq: Four times a day (QID) | ORAL | Status: DC | PRN
  Administered 2017-05-03 – 2017-05-07 (×3): 4 mg via ORAL
  Filled 2017-05-03 (×3): qty 1

## 2017-05-03 MED ORDER — SODIUM CHLORIDE 0.9 % IV BOLUS (SEPSIS)
2221.0000 mL | Freq: Once | INTRAVENOUS | Status: AC
  Administered 2017-05-03: 2221 mL via INTRAVENOUS

## 2017-05-03 MED ORDER — SODIUM CHLORIDE 0.9 % IV BOLUS (SEPSIS)
500.0000 mL | Freq: Once | INTRAVENOUS | Status: AC
  Administered 2017-05-03: 500 mL via INTRAVENOUS

## 2017-05-03 MED ORDER — DEXTROSE 5 % IV SOLN
1.0000 g | Freq: Three times a day (TID) | INTRAVENOUS | Status: DC
  Administered 2017-05-04 – 2017-05-06 (×7): 1 g via INTRAVENOUS
  Filled 2017-05-03 (×11): qty 1

## 2017-05-03 MED ORDER — ACETAMINOPHEN 650 MG RE SUPP
650.0000 mg | Freq: Once | RECTAL | Status: AC
  Administered 2017-05-03: 650 mg via RECTAL
  Filled 2017-05-03: qty 1

## 2017-05-03 MED ORDER — SODIUM CHLORIDE 0.9 % IV BOLUS (SEPSIS): 500.0000 mL | Freq: Once | INTRAVENOUS | Status: DC

## 2017-05-03 MED ORDER — ENOXAPARIN SODIUM 30 MG/0.3ML ~~LOC~~ SOLN
30.0000 mg | SUBCUTANEOUS | Status: DC
  Administered 2017-05-03 – 2017-05-04 (×2): 30 mg via SUBCUTANEOUS
  Filled 2017-05-03 (×2): qty 0.3

## 2017-05-03 MED ORDER — ONDANSETRON HCL 4 MG/2ML IJ SOLN
4.0000 mg | Freq: Four times a day (QID) | INTRAMUSCULAR | Status: DC | PRN
  Administered 2017-05-06 – 2017-05-08 (×3): 4 mg via INTRAVENOUS
  Filled 2017-05-03 (×3): qty 2

## 2017-05-03 MED ORDER — LEVOFLOXACIN IN D5W 750 MG/150ML IV SOLN
750.0000 mg | INTRAVENOUS | Status: DC
  Administered 2017-05-05: 750 mg via INTRAVENOUS
  Filled 2017-05-03: qty 150

## 2017-05-03 MED ORDER — VANCOMYCIN HCL IN DEXTROSE 1-5 GM/200ML-% IV SOLN
1000.0000 mg | INTRAVENOUS | Status: DC
  Administered 2017-05-04: 1000 mg via INTRAVENOUS
  Filled 2017-05-03: qty 200

## 2017-05-03 MED ORDER — METHYLPREDNISOLONE SODIUM SUCC 125 MG IJ SOLR
60.0000 mg | Freq: Three times a day (TID) | INTRAMUSCULAR | Status: DC
  Administered 2017-05-04 – 2017-05-10 (×19): 60 mg via INTRAVENOUS
  Filled 2017-05-03 (×17): qty 2

## 2017-05-03 MED ORDER — DEXTROSE 5 % IV SOLN
2.0000 g | Freq: Once | INTRAVENOUS | Status: AC
  Administered 2017-05-03: 2 g via INTRAVENOUS
  Filled 2017-05-03: qty 2

## 2017-05-03 MED ORDER — METHYLPREDNISOLONE SODIUM SUCC 125 MG IJ SOLR
125.0000 mg | Freq: Once | INTRAMUSCULAR | Status: AC
  Administered 2017-05-03: 125 mg via INTRAVENOUS
  Filled 2017-05-03: qty 2

## 2017-05-03 MED ORDER — SODIUM CHLORIDE 0.9 % IV SOLN
1000.0000 mL | INTRAVENOUS | Status: DC
  Administered 2017-05-03: 1000 mL via INTRAVENOUS

## 2017-05-03 MED ORDER — OXYCODONE-ACETAMINOPHEN 5-325 MG PO TABS
1.0000 | ORAL_TABLET | ORAL | Status: DC | PRN
  Administered 2017-05-03: 2 via ORAL
  Filled 2017-05-03: qty 2

## 2017-05-03 MED ORDER — VANCOMYCIN HCL 10 G IV SOLR
2000.0000 mg | Freq: Once | INTRAVENOUS | Status: AC
  Administered 2017-05-03: 2000 mg via INTRAVENOUS
  Filled 2017-05-03: qty 2000

## 2017-05-03 MED ORDER — FLUOXETINE HCL 20 MG PO CAPS
20.0000 mg | ORAL_CAPSULE | Freq: Every day | ORAL | Status: DC
  Administered 2017-05-03 – 2017-05-04 (×2): 20 mg via ORAL
  Filled 2017-05-03 (×2): qty 1

## 2017-05-03 MED ORDER — PANTOPRAZOLE SODIUM 40 MG PO TBEC
40.0000 mg | DELAYED_RELEASE_TABLET | Freq: Every day | ORAL | Status: DC
  Administered 2017-05-03 – 2017-05-09 (×6): 40 mg via ORAL
  Filled 2017-05-03 (×6): qty 1

## 2017-05-03 MED ORDER — MAGNESIUM OXIDE 400 (241.3 MG) MG PO TABS
400.0000 mg | ORAL_TABLET | Freq: Every day | ORAL | Status: DC
  Administered 2017-05-03 – 2017-05-17 (×12): 400 mg via ORAL
  Filled 2017-05-03 (×12): qty 1

## 2017-05-03 MED ORDER — MOMETASONE FURO-FORMOTEROL FUM 200-5 MCG/ACT IN AERO
2.0000 | INHALATION_SPRAY | Freq: Two times a day (BID) | RESPIRATORY_TRACT | Status: DC
Start: 2017-05-03 — End: 2017-05-09
  Administered 2017-05-04 – 2017-05-08 (×7): 2 via RESPIRATORY_TRACT
  Filled 2017-05-03 (×2): qty 8.8

## 2017-05-03 MED ORDER — ALBUTEROL (5 MG/ML) CONTINUOUS INHALATION SOLN
10.0000 mg/h | INHALATION_SOLUTION | Freq: Once | RESPIRATORY_TRACT | Status: AC
  Administered 2017-05-03: 10 mg/h via RESPIRATORY_TRACT
  Filled 2017-05-03: qty 20

## 2017-05-03 MED ORDER — IPRATROPIUM BROMIDE 0.02 % IN SOLN
1.0000 mg | Freq: Once | RESPIRATORY_TRACT | Status: AC
  Administered 2017-05-03: 1 mg via RESPIRATORY_TRACT
  Filled 2017-05-03: qty 5

## 2017-05-03 MED ORDER — CLOPIDOGREL BISULFATE 75 MG PO TABS
75.0000 mg | ORAL_TABLET | Freq: Every day | ORAL | Status: DC
  Administered 2017-05-03 – 2017-05-17 (×12): 75 mg via ORAL
  Filled 2017-05-03 (×12): qty 1

## 2017-05-03 MED ORDER — CALCIUM CARBONATE-VITAMIN D 500-200 MG-UNIT PO TABS
1.0000 | ORAL_TABLET | Freq: Every day | ORAL | Status: DC
  Administered 2017-05-04 – 2017-05-17 (×11): 1 via ORAL
  Filled 2017-05-03 (×16): qty 1

## 2017-05-03 MED ORDER — SODIUM CHLORIDE 0.9 % IV SOLN
INTRAVENOUS | Status: AC
  Administered 2017-05-03: 23:00:00 via INTRAVENOUS

## 2017-05-03 NOTE — ED Notes (Signed)
Pt comes in EMS, responding to pain. Right sided weakness, generalized edema

## 2017-05-03 NOTE — ED Provider Notes (Signed)
[facility] EMERGENCY DEPARTMENT Provider Note   CSN: [id number] Arrival date & time: 05/03/17  1541     History   Chief Complaint Chief Complaint  Patient presents with  . Altered Mental Status    HPI CAMILE ESTERS is a 67 y.o. female.  The history is provided by the EMS personnel and a relative. The history is limited by the condition of the patient (AMS).  Altered Mental Status    Pt was seen at 1545. Per EMS:  Son called EMS after finding pt "unresponsive" PTA. LKW last night approximately 2200. EMS states pt "felt warm" and had "low O2 Sats" en route to the ED. Pt laying eyes open, will turn head and look at staff, right facial droop, left arm weakness.   Past Medical History:  Diagnosis Date  . Anxiety   . Arthritis   . CHF (congestive heart failure) (HCC)   . Chronic back pain   . Chronic pain   . COPD (chronic obstructive pulmonary disease) (HCC)   . Coronary artery disease   . Depression   . GERD (gastroesophageal reflux disease)   . Hypertension   . Migraine   . Stroke [facility])     Patient Active Problem List   Diagnosis Date Noted  . Major depression 07/01/2014    Past Surgical History:  Procedure Laterality Date  . ABDOMINAL HYSTERECTOMY    . breast lump removed    . BREAST SURGERY    . CHOLECYSTECTOMY    . FOOT SURGERY      OB History    No data available       Home Medications    Prior to Admission medications   Medication Sig Start Date End Date Taking? Authorizing Provider  albuterol (PROVENTIL HFA;VENTOLIN HFA) 108 (90 BASE) MCG/ACT inhaler Inhale 2 puffs into the lungs 2 (two) times daily.    [provider]  ALPRAZolam Prudy Feeler) 1 MG tablet Take 1 tablet (1 mg total) 2 (two) times daily by mouth. 02/06/17   Myrlene Broker, MD  amLODipine (NORVASC) 5 MG tablet Take 5 mg by mouth daily.    [provider]  ARIPiprazole (ABILIFY) 5 MG tablet Take 1 tablet (5 mg total) at bedtime by mouth. 02/06/17 02/06/18  Myrlene Broker,  MD  Calcium Carb-Cholecalciferol (CALCIUM 600-D PO) Take 1 tablet by mouth daily.    [provider]  clopidogrel (PLAVIX) 75 MG tablet Take 75 mg by mouth daily.    [provider]  FLUoxetine (PROZAC) 20 MG capsule Take 1 capsule (20 mg total) daily by mouth. 02/06/17 02/06/18  Myrlene Broker, MD  fluticasone-salmeterol (ADVAIR HFA) [phone] MCG/ACT inhaler Inhale 2 puffs into the lungs 2 (two) times daily.    [provider]  furosemide (LASIX) 40 MG tablet TAKE 1 AND 1/2 TABLETS BY MOUTH DAILY 03/19/17   Antoine Poche, MD  gabapentin (NEURONTIN) 300 MG capsule Take 300 mg by mouth 2 (two) times daily.    [provider]  gabapentin (NEURONTIN) 600 MG tablet Take 600 mg 3 (three) times daily by mouth. 01/29/17   [provider]  lisinopril (PRINIVIL,ZESTRIL) 40 MG tablet Take 40 mg by mouth daily.    [provider]  Magnesium Oxide 400 (240 Mg) MG TABS Take 1 tablet (400 mg total) by mouth daily. 02/27/17   Antoine Poche, MD  metoprolol succinate (TOPROL-XL) 50 MG 24 hr tablet Take 25 mg by mouth daily. Take with or immediately following a  meal.    [provider]  Omega-3 Fatty Acids (FISH OIL) 1000 MG CAPS Take by mouth 2 (two) times daily. 12/26/15   [provider]  omeprazole (PRILOSEC) 40 MG capsule Take 40 mg by mouth daily.    [provider]  oxyCODONE-acetaminophen (PERCOCET/ROXICET) 5-325 MG tablet Take 1-2 tablets by mouth every 4 (four) hours as needed for severe pain.    [provider]  pravastatin (PRAVACHOL) 80 MG tablet Take 80 mg by mouth daily.    [provider]  prazosin (MINIPRESS) 2 MG capsule Take 1 capsule (2 mg total) at bedtime by mouth. 02/06/17   Myrlene Broker, MD    Family History Family History  Problem Relation Age of Onset  . Bipolar disorder Daughter     Social History Social History   Tobacco Use  . Smoking status: Current Every Day Smoker     Packs/day: 1.00    Types: Cigarettes  . Smokeless tobacco: Never Used  Substance Use Topics  . Alcohol use: No  . Drug use: No     Allergies   Penicillins   Review of Systems Review of Systems  Unable to perform ROS: Mental status change     Physical Exam Updated Vital Signs BP (!) 99/53   Pulse 86   Temp (!) 101.5 F (38.6 C) (Rectal)   Resp 14   Wt 90.7 kg (200 lb)   SpO2 93%   BMI 36.58 kg/m     Patient Vitals for the past 24 hrs:  BP Temp Temp src Pulse Resp SpO2 Weight  05/03/17 2000 106/63 99.1 F (37.3 C) - 84 12 (!) 89 % -  05/03/17 1930 102/61 99.1 F (37.3 C) - 84 15 91 % -  05/03/17 1927 - - - - - 91 % -  05/03/17 1900 (!) 91/56 99.3 F (37.4 C) - 86 12 91 % -  05/03/17 1828 (!) 83/51 100 F (37.8 C) - 91 13 90 % -  05/03/17 1800 (!) 81/57 - - 95 13 94 % -  05/03/17 1730 (!) 81/55 - - 91 15 94 % -  05/03/17 1700 (!) 99/53 - - 86 14 93 % -  05/03/17 1637 - - - - - 93 % -  05/03/17 1632 (!) 95/53 - - 88 16 92 % -  05/03/17 1630 (!) 86/55 - - - (!) 21 - -  05/03/17 1620 91/61 - - 88 15 93 % -  05/03/17 1600 (!) 90/59 (!) 101.5 F (38.6 C) Rectal 91 15 92 % -  05/03/17 1545 - - - - - - 90.7 kg (200 lb)     Physical Exam 1550: Physical examination:  Nursing notes reviewed; Vital signs and O2 SAT reviewed; +febrile.;;  Constitutional: Well developed, Well nourished, In no acute distress; Head:  Normocephalic, atraumatic; Eyes: EOMI, PERRL, No scleral icterus; ENMT: Mouth and pharynx normal, Mucous membranes dry and cracked; Neck: Supple, Full range of motion, No lymphadenopathy; Cardiovascular: Tachycardic rate and rhythm, No gallop; Respiratory: Breath sounds diminished & equal bilaterally, insp/exp wheezes. , Normal respiratory effort/excursion; Chest: Nontender, Movement normal; Abdomen: Soft, Nontender, Nondistended, Normal bowel sounds; Genitourinary: No CVA tenderness; Extremities: Pulses normal, No deformity, +mld erythema to bilat lower anterior  tibial areas. +tr pedal edema bilat. No calf edema or asymmetry.; Neuro: Awake, alert. Laying eyes open. Turns head to ED staff speaking to her. +right facial droop. No speech. Pt moves RUE and bilat LE's on stretcher spontaneously and to command,  strength 3/5. +LUE 2/5 weakness.; Skin: Color normal, Warm, Dry.   ED Treatments / Results  Labs (all labs ordered are listed, but only abnormal results are displayed)   EKG  EKG Interpretation  Date/Time:  Thursday May 03 2017 15:50:47 EST Ventricular Rate:  91 PR Interval:    QRS Duration: 87 QT Interval:  333 QTC Calculation: 410 R Axis:   50 Text Interpretation:  Sinus rhythm Borderline low voltage, extremity leads Abnormal T, consider ischemia, diffuse leads Baseline wander When compared with ECG of 09/07/2016 T wave abnormality is more pronounced Confirmed by Samuel Jester [phone]) on 05/03/2017 4:02:33 PM       Radiology   Procedures Procedures (including critical care time)  Medications Ordered in ED Medications  0.9 %  sodium chloride infusion (not administered)  levofloxacin (LEVAQUIN) IVPB 750 mg (not administered)  aztreonam (AZACTAM) 2 g in dextrose 5 % 50 mL IVPB (not administered)  vancomycin (VANCOCIN) IVPB 1000 mg/200 mL premix (not administered)  acetaminophen (TYLENOL) suppository 650 mg (not administered)     Initial Impression / Assessment and Plan / ED Course  I have reviewed the triage vital signs and the nursing notes.  Pertinent labs & imaging results that were available during my care of the patient were reviewed by me and considered in my medical decision making (see chart for details).  MDM Reviewed: previous chart, nursing note and vitals Reviewed previous: labs and ECG Interpretation: labs, ECG, x-ray and CT scan Total time providing critical care: 30-74 minutes. This excludes time spent performing separately reportable procedures and services. Consults: admitting MD   CRITICAL  CARE Performed by: Laray Anger Total critical care time: 70 minutes Critical care time was exclusive of separately billable procedures and treating other patients. Critical care was necessary to treat or prevent imminent or life-threatening deterioration. Critical care was time spent personally by me on the following activities: development of treatment plan with patient and/or surrogate as well as nursing, discussions with consultants, evaluation of patient's response to treatment, examination of patient, obtaining history from patient or surrogate, ordering and performing treatments and interventions, ordering and review of laboratory studies, ordering and review of radiographic studies, pulse oximetry and re-evaluation of patient's condition.  Results for orders placed or performed during the hospital encounter of 05/03/17  Blood Culture (routine x 2)  Result Value Ref Range   Specimen Description RIGHT ANTECUBITAL    Special Requests      BOTTLES DRAWN AEROBIC AND ANAEROBIC Blood Culture adequate volume   Culture PENDING    Report Status PENDING   Blood Culture (routine x 2)  Result Value Ref Range   Specimen Description BLOOD LEFT HAND    Special Requests      BOTTLES DRAWN AEROBIC AND ANAEROBIC Blood Culture adequate volume   Culture PENDING    Report Status PENDING   Comprehensive metabolic panel  Result Value Ref Range   Sodium 139 135 - 145 mmol/L   Potassium 5.8 (H) 3.5 - 5.1 mmol/L   Chloride 107 101 - 111 mmol/L   CO2 22 22 - 32 mmol/L   Glucose, Bld 93 65 - 99 mg/dL   BUN 39 (H) 6 - 20 mg/dL   Creatinine, Ser 9.02 (H) 0.44 - 1.00 mg/dL   Calcium 8.8 (L) 8.9 - 10.3 mg/dL   Total Protein 6.9 6.5 - 8.1 g/dL   Albumin 3.2 (L) 3.5 - 5.0 g/dL   AST 47 (H) 15 - 41 U/L   ALT 28 14 -  54 U/L   Alkaline Phosphatase 115 38 - 126 U/L   Total Bilirubin 0.7 0.3 - 1.2 mg/dL   GFR calc non Af Amer 25 (L) >60 mL/min   GFR calc Af Amer 29 (L) >60 mL/min   Anion gap 10 5 - 15   CBC WITH DIFFERENTIAL  Result Value Ref Range   WBC 13.4 (H) 4.0 - 10.5 K/uL   RBC 4.42 3.87 - 5.11 MIL/uL   Hemoglobin 12.8 12.0 - 15.0 g/dL   HCT 16.1 09.6 - 04.5 %   MCV 95.0 78.0 - 100.0 fL   MCH 29.0 26.0 - 34.0 pg   MCHC 30.5 30.0 - 36.0 g/dL   RDW 40.9 81.1 - 91.4 %   Platelets 201 150 - 400 K/uL   Neutrophils Relative % 85 %   Neutro Abs 11.3 1.7 - 7.7 K/uL   Lymphocytes Relative 10 %   Lymphs Abs 1.3 0.7 - 4.0 K/uL   Monocytes Relative 5 %   Monocytes Absolute 0.7 0.1 - 1.0 K/uL   Eosinophils Relative 0 %   Eosinophils Absolute 0.0 0.0 - 0.7 K/uL   Basophils Relative 0 %   Basophils Absolute 0.0 0.0 - 0.1 K/uL   WBC Morphology WHITE COUNT CONFIRMED ON SMEAR   Urinalysis, Routine w reflex microscopic  Result Value Ref Range   Color, Urine YELLOW YELLOW   APPearance CLEAR CLEAR   Specific Gravity, Urine 1.014 1.005 - 1.030   pH 5.0 5.0 - 8.0   Glucose, UA NEGATIVE NEGATIVE mg/dL   Hgb urine dipstick NEGATIVE NEGATIVE   Bilirubin Urine NEGATIVE NEGATIVE   Ketones, ur NEGATIVE NEGATIVE mg/dL   Protein, ur NEGATIVE NEGATIVE mg/dL   Nitrite NEGATIVE NEGATIVE   Leukocytes, UA NEGATIVE NEGATIVE  Troponin I  Result Value Ref Range   Troponin I <0.03 <0.03 ng/mL  Brain natriuretic peptide  Result Value Ref Range   B Natriuretic Peptide 202.0 (H) 0.0 - 100.0 pg/mL  I-Stat CG4 Lactic Acid, ED  (not at  [facility])  Result Value Ref Range   Lactic Acid, Venous 1.29 0.5 - 1.9 mmol/L   Dg Chest Port 1 View Result Date: 05/03/2017 CLINICAL DATA:  Patient found unresponsive. EXAM: PORTABLE CHEST 1 VIEW COMPARISON:  Aug 07, 2016 FINDINGS: There is focal infiltrate in the right lower lung, likely pneumonia or aspiration. The cardiomediastinal silhouette is normal given technique. No pneumothorax. No other acute abnormalities. IMPRESSION: New infiltrate in the right lower lung, likely pneumonia or aspiration. Recommend follow-up to resolution. Electronically Signed   By: Gerome Sam  III M.D   On: 05/03/2017 16:12   Ct Head Wo Contrast Result Date: 05/03/2017 CLINICAL DATA:  Found unresponsive EXAM: CT HEAD WITHOUT CONTRAST TECHNIQUE: Contiguous axial images were obtained from the base of the skull through the vertex without intravenous contrast. COMPARISON:  02/01/2017 FINDINGS: Brain: No acute intracranial abnormality. Specifically, no hemorrhage, hydrocephalus, mass lesion, acute infarction, or significant intracranial injury. Vascular: No hyperdense vessel or unexpected calcification. Skull: No acute calvarial abnormality. Sinuses/Orbits: Visualized paranasal sinuses and mastoids clear. Orbital soft tissues unremarkable. Other: None IMPRESSION: No acute intracranial abnormality. Electronically Signed   By: Charlett Nose M.D.   On: 05/03/2017 18:52      1550:  Febrile on arrival, wheezing. Code sepsis called. APAP ordered for fever. SBP high 90's, HR 80's. Hour long neb ordered for wheezing.  1620:  Pt now speaking with ED staff:  Knows her lips are dry. Unsure if she has eaten/drank since yesterday.  No facial droop now. Remains with LUE weakness > global weakness.    1700:  SBP labile: ranging high 90's then dipping into 80's. IVF 30mg /kg bolus ordered. BC and UC already obtained and IV abx infusing.   1730:  Son at bedside now; pt talking to family and ED staff easily: States she hasn't felt well since last night and been coughing.   1900:  SBP improving slowly with IVF boluses and IV solumedrol. Pt remains awake/alert, talking with ED staff easily.   2030:  T/C to [facility]. Sherryll Burger, case discussed, including:  HPI, pertinent PM/SHx, VS/PE, dx testing, ED course and treatment:  Agreeable to admit.      Final Clinical Impressions(s) / ED Diagnoses   Final diagnoses:  None    ED Discharge Orders    None        Samuel Jester, DO 05/05/17 1124

## 2017-05-03 NOTE — ED Notes (Signed)
Pt gone to CT 

## 2017-05-03 NOTE — ED Notes (Signed)
RT at the bedside to start hour long breathing treatment

## 2017-05-03 NOTE — ED Notes (Signed)
EDP at the bedside, pt will respond, eyes open.

## 2017-05-03 NOTE — ED Triage Notes (Signed)
Per EMS pt LKW 2200 05/02/17, was found by son today and pt unresponsive. Hx of stroke with no remaining deficits. Pt turns head to verbal stimuli, is not communicating at this time. Weak grip on L hand, no grip to R.

## 2017-05-03 NOTE — H&P (Signed)
History and Physical    TIFFINE PETROFF POE:[id number] DOB: September 01, 1950 DOA: 05/03/2017  PCP: Kirstie Peri, MD   Patient coming from: Home  Chief Complaint: AMS "Found down at home"  HPI: Mariah White is a 67 y.o. female with medical history, per documentation, significant for diastolic CHF, COPD, CAD, hypertension, and prior CVA who was found down at home at approximately 1545 by her son who lives with her.  He called EMS and stated at that time that he last saw her at her usual baseline the previous night at 2200.  EMS apparently noted that the patient would turn her head to look at the staff and was noted to have some right facial droop and left arm weakness.  She was also noted to have some low O2 saturations, but otherwise felt warm.   ED Course: Code sepsis was initiated and patient has undergone a 30 cc/kg fluid bolus with reasonable improvement to her hypotension.  Vital signs are currently stable.  Chest x-ray demonstrates right lower lobe infiltrate and CT head demonstrates no acute findings.  She has been started on vancomycin, Levaquin, and aztreonam for treatment due to penicillin allergy.  She was noted to have some chest tightness and wheezing for which she was also given a breathing treatment as well as IV Solu-Medrol and appears to be in no significant respiratory distress at this time.  She does remain on 3-4 L nasal cannula.  She is currently more awake and responsive, but does not seem to answer questions very well.  She states that her low back is currently bothering her and denies any other complaints.  Review of Systems: Cannot be adequately obtained due to patient's condition.  Past Medical History:  Diagnosis Date  . Anxiety   . Arthritis   . CHF (congestive heart failure) (HCC)   . Chronic back pain   . Chronic pain   . COPD (chronic obstructive pulmonary disease) (HCC)   . Coronary artery disease   . Depression   . GERD (gastroesophageal reflux disease)   .  Hypertension   . Migraine   . Stroke [facility])     Past Surgical History:  Procedure Laterality Date  . ABDOMINAL HYSTERECTOMY    . breast lump removed    . BREAST SURGERY    . CHOLECYSTECTOMY    . FOOT SURGERY       reports that she has been smoking cigarettes.  She has been smoking about 1.00 pack per day. she has never used smokeless tobacco. She reports that she does not drink alcohol or use drugs.  Allergies  Allergen Reactions  . Penicillins Itching    Family History  Problem Relation Age of Onset  . Bipolar disorder Daughter     Prior to Admission medications   Medication Sig Start Date End Date Taking? Authorizing Provider  albuterol (PROVENTIL HFA;VENTOLIN HFA) 108 (90 BASE) MCG/ACT inhaler Inhale 2 puffs into the lungs 2 (two) times daily.   Yes [provider]  ALPRAZolam Prudy Feeler) 1 MG tablet Take 1 tablet (1 mg total) 2 (two) times daily by mouth. 02/06/17  Yes Myrlene Broker, MD  amLODipine (NORVASC) 10 MG tablet Take 10 mg by mouth daily.    Yes [provider]  ARIPiprazole (ABILIFY) 5 MG tablet Take 1 tablet (5 mg total) at bedtime by mouth. 02/06/17 02/06/18 Yes Myrlene Broker, MD  clopidogrel (PLAVIX) 75 MG tablet Take 75 mg by mouth daily.   Yes [provider]  FLUoxetine (PROZAC) 20 MG capsule Take 1 capsule (20 mg total) daily by mouth. 02/06/17 02/06/18 Yes Myrlene Broker, MD  Fluticasone-Salmeterol (ADVAIR) 250-50 MCG/DOSE AEPB Inhale 1 puff into the lungs 2 (two) times daily.   Yes [provider]  furosemide (LASIX) 40 MG tablet TAKE 1 AND 1/2 TABLETS BY MOUTH DAILY 03/19/17  Yes Branch, Dorothe Pea, MD  gabapentin (NEURONTIN) 600 MG tablet Take 600 mg 3 (three) times daily by mouth. 01/29/17  Yes [provider]  lisinopril (PRINIVIL,ZESTRIL) 40 MG tablet Take 40 mg by mouth daily.   Yes [provider]  metoprolol succinate (TOPROL-XL) 50 MG 24 hr tablet Take 25 mg by mouth daily. Take with or immediately  following a meal.   Yes [provider]  oxyCODONE-acetaminophen (PERCOCET/ROXICET) 5-325 MG tablet Take 1-2 tablets by mouth every 4 (four) hours as needed for severe pain.   Yes [provider]  pantoprazole (PROTONIX) 40 MG tablet Take 40 mg by mouth daily.   Yes [provider]  pravastatin (PRAVACHOL) 80 MG tablet Take 80 mg by mouth daily.   Yes [provider]  prazosin (MINIPRESS) 2 MG capsule Take 1 capsule (2 mg total) at bedtime by mouth. 02/06/17  Yes Myrlene Broker, MD  Calcium Carb-Cholecalciferol (CALCIUM 600-D PO) Take 1 tablet by mouth daily.    [provider]  Magnesium Oxide 400 (240 Mg) MG TABS Take 1 tablet (400 mg total) by mouth daily. 02/27/17   Antoine Poche, MD  Omega-3 Fatty Acids (FISH OIL) 1000 MG CAPS Take by mouth 2 (two) times daily. 12/26/15   [provider]    Physical Exam: Vitals:   05/03/17 1900 05/03/17 1927 05/03/17 1930 05/03/17 2000  BP: (!) 91/56  102/61 106/63  Pulse: 86  84 84  Resp: 12  15 12   Temp: 99.3 F (37.4 C)  99.1 F (37.3 C) 99.1 F (37.3 C)  TempSrc:      SpO2: 91% 91% 91% (!) 89%  Weight:        Constitutional: NAD, calm, comfortable; alert, but not oriented Vitals:   05/03/17 1900 05/03/17 1927 05/03/17 1930 05/03/17 2000  BP: (!) 91/56  102/61 106/63  Pulse: 86  84 84  Resp: 12  15 12   Temp: 99.3 F (37.4 C)  99.1 F (37.3 C) 99.1 F (37.3 C)  TempSrc:      SpO2: 91% 91% 91% (!) 89%  Weight:       Eyes: lids and conjunctivae normal ENMT: Mucous membranes are moist.  Neck: normal, supple Respiratory: clear to auscultation bilaterally. Normal respiratory effort. No accessory muscle use. On 4L [place]. Cardiovascular: Regular rate and rhythm, no murmurs. No extremity edema. Abdomen: no tenderness, no distention. Bowel sounds positive.  Musculoskeletal:  No joint deformity upper and lower extremities.   Skin: no rashes, lesions, ulcers.   Labs on Admission: I  have personally reviewed following labs and imaging studies  CBC: Recent Labs  Lab 05/03/17 1609  WBC 13.4*  NEUTROABS 11.3  HGB 12.8  HCT 42.0  MCV 95.0  PLT 201   Basic Metabolic Panel: Recent Labs  Lab 05/03/17 1609  NA 139  K 5.8*  CL 107  CO2 22  GLUCOSE 93  BUN 39*  CREATININE 1.96*  CALCIUM 8.8*   GFR: Estimated Creatinine Clearance: 29.2 mL/min (A) (by C-G formula based on SCr of 1.96 mg/dL (H)). Liver Function Tests: Recent Labs  Lab 05/03/17 1609  AST 47*  ALT  28  ALKPHOS 115  BILITOT 0.7  PROT 6.9  ALBUMIN 3.2*   No results for input(s): LIPASE, AMYLASE in the last 168 hours. No results for input(s): AMMONIA in the last 168 hours. Coagulation Profile: No results for input(s): INR, PROTIME in the last 168 hours. Cardiac Enzymes: Recent Labs  Lab 05/03/17 1609  TROPONINI <0.03   BNP (last 3 results) No results for input(s): PROBNP in the last 8760 hours. HbA1C: No results for input(s): HGBA1C in the last 72 hours. CBG: No results for input(s): GLUCAP in the last 168 hours. Lipid Profile: No results for input(s): CHOL, HDL, LDLCALC, TRIG, CHOLHDL, LDLDIRECT in the last 72 hours. Thyroid Function Tests: No results for input(s): TSH, T4TOTAL, FREET4, T3FREE, THYROIDAB in the last 72 hours. Anemia Panel: No results for input(s): VITAMINB12, FOLATE, FERRITIN, TIBC, IRON, RETICCTPCT in the last 72 hours. Urine analysis:    Component Value Date/Time   COLORURINE YELLOW 05/03/2017 1554   APPEARANCEUR CLEAR 05/03/2017 1554   LABSPEC 1.014 05/03/2017 1554   PHURINE 5.0 05/03/2017 1554   GLUCOSEU NEGATIVE 05/03/2017 1554   HGBUR NEGATIVE 05/03/2017 1554   BILIRUBINUR NEGATIVE 05/03/2017 1554   KETONESUR NEGATIVE 05/03/2017 1554   PROTEINUR NEGATIVE 05/03/2017 1554   NITRITE NEGATIVE 05/03/2017 1554   LEUKOCYTESUR NEGATIVE 05/03/2017 1554    Radiological Exams on Admission: Ct Head Wo Contrast  Result Date: 05/03/2017 CLINICAL DATA:  Found  unresponsive EXAM: CT HEAD WITHOUT CONTRAST TECHNIQUE: Contiguous axial images were obtained from the base of the skull through the vertex without intravenous contrast. COMPARISON:  02/01/2017 FINDINGS: Brain: No acute intracranial abnormality. Specifically, no hemorrhage, hydrocephalus, mass lesion, acute infarction, or significant intracranial injury. Vascular: No hyperdense vessel or unexpected calcification. Skull: No acute calvarial abnormality. Sinuses/Orbits: Visualized paranasal sinuses and mastoids clear. Orbital soft tissues unremarkable. Other: None IMPRESSION: No acute intracranial abnormality. Electronically Signed   By: Charlett Nose M.D.   On: 05/03/2017 18:52   Dg Chest Port 1 View  Result Date: 05/03/2017 CLINICAL DATA:  Patient found unresponsive. EXAM: PORTABLE CHEST 1 VIEW COMPARISON:  Aug 07, 2016 FINDINGS: There is focal infiltrate in the right lower lung, likely pneumonia or aspiration. The cardiomediastinal silhouette is normal given technique. No pneumothorax. No other acute abnormalities. IMPRESSION: New infiltrate in the right lower lung, likely pneumonia or aspiration. Recommend follow-up to resolution. Electronically Signed   By: Gerome Sam III M.D   On: 05/03/2017 16:12    EKG: Independently reviewed. Sinus rhythm.  Assessment/Plan Principal Problem:   Sepsis (HCC) Active Problems:   CHF (congestive heart failure) (HCC)   COPD (chronic obstructive pulmonary disease) (HCC)   CAD (coronary artery disease)   Essential hypertension   Stroke (HCC)   Aspiration pneumonia (HCC)   AKI (acute kidney injury) (HCC)    1. Encephalopathy metabolic versus CVA.  Continue treatment for sepsis with IV antibiotics as ordered along with ongoing IV fluid.  This appears to be improving with treatment, therefore suggesting more of a metabolic component.  CT head appears normal at this time, but given initial neurological deficits that appear focal, I will still order brain MRI in  a.m. for further evaluation.  Continue Plavix and statin from home medications for now and avoid sedating agents such as Xanax and gabapentin. 2. Sepsis secondary to presumed aspiration pneumonia.  Continue coverage with antibiotics as ordered.  Follow-up cultures. 3. COPD with mild to moderate exacerbation related to above.  Continue IV steroids, and breathing treatments along with inhaled steroid. 4.  Acute hypoxemic respiratory failure related to above.  Wean oxygen as tolerated and continue steroids as well as breathing treatments. 5. AK I.  Continue IV fluid and monitor I's and O's as well as daily weights.  Repeat labs in a.m.  Avoid nephrotoxic agents. 6. Hypertension.  Hold all home antihypertensives due to hypotension at this time. 7. History of diastolic CHF. Last echo 08/2016 with LVEF>75% and abnormal diastolic function noted at that time. Could consider repeat echo if hypotension persistent and unimproved.   DVT prophylaxis: Lovenox Code Status: Full Family Communication: None at bedside Disposition Plan:TBD once stable Consults called:None Admission status: Inpatient; ICU   Jamelyn Bovard Hoover Brunette DO [facility] Pager [phone]  If 7PM-7AM, please contact night-coverage [email or web address] Password [facility]  05/03/2017, 8:45 PM

## 2017-05-03 NOTE — ED Notes (Signed)
Family visiting, pt will answer their questions

## 2017-05-03 NOTE — Progress Notes (Addendum)
Pharmacy Note:  Initial antibiotic(s) regimen of Vancomycin, levaquin, and aztreonam ordered by EDP to treat sepsis.  CrCl cannot be calculated (Patient's most recent lab result is older than the maximum 21 days allowed.).   Allergies  Allergen Reactions  . Penicillins Itching    Vitals:   05/03/17 1600  BP: (!) 90/59  Pulse: 91  Resp: 15  SpO2: 92%    Anti-infectives (From admission, onward)   Start     Dose/Rate Route Frequency Ordered Stop   05/03/17 1630  vancomycin (VANCOCIN) 2,000 mg in sodium chloride 0.9 % 500 mL IVPB     2,000 mg 250 mL/hr over 120 Minutes Intravenous  Once 05/03/17 1603     05/03/17 1600  levofloxacin (LEVAQUIN) IVPB 750 mg     750 mg 100 mL/hr over 90 Minutes Intravenous  Once 05/03/17 1554     05/03/17 1600  aztreonam (AZACTAM) 2 g in dextrose 5 % 50 mL IVPB     2 g 100 mL/hr over 30 Minutes Intravenous  Once 05/03/17 1554     05/03/17 1600  vancomycin (VANCOCIN) IVPB 1000 mg/200 mL premix  Status:  Discontinued     1,000 mg 200 mL/hr over 60 Minutes Intravenous  Once 05/03/17 1554 05/03/17 1603      Plan: Initial dose(s) of Vancomycin 2gm , Aztreonam 2gm, and Levaquin 750mg  IV X 1 ordered. F/U admission orders for further dosing if therapy continued.  Elder Cyphers, BS Loura Back, [place] Clinical Pharmacist Pager [phone] 05/03/2017 4:25 PM   Addendum: Continued on admission. Vancomycin 1gm IV every 24 hours. Goal trough 15-20 Levaquin 750mg  IV every 48 hours. Aztreonam 1gm IV every 8 hours. Monitor labs, micro and vitals.   Mady Gemma, [facility] 05/03/2017 9:20 PM

## 2017-05-03 NOTE — ED Notes (Signed)
Son visiting at the beside, states, she was coughing last night and stated she did not feel well. Pt is a smoker and coughs/

## 2017-05-04 ENCOUNTER — Inpatient Hospital Stay (HOSPITAL_COMMUNITY): Payer: Medicare Other

## 2017-05-04 LAB — CBC
HCT: 36.9 % (ref 36.0–46.0)
Hemoglobin: 11.3 g/dL — ABNORMAL LOW (ref 12.0–15.0)
MCH: 29.5 pg (ref 26.0–34.0)
MCHC: 30.6 g/dL (ref 30.0–36.0)
MCV: 96.3 fL (ref 78.0–100.0)
PLATELETS: 185 10*3/uL (ref 150–400)
RBC: 3.83 MIL/uL — ABNORMAL LOW (ref 3.87–5.11)
RDW: 15.1 % (ref 11.5–15.5)
WBC: 20.5 10*3/uL — ABNORMAL HIGH (ref 4.0–10.5)

## 2017-05-04 LAB — LACTIC ACID, PLASMA
Lactic Acid, Venous: 2.6 mmol/L (ref 0.5–1.9)
Lactic Acid, Venous: 2.1 mmol/L (ref 0.5–1.9)

## 2017-05-04 LAB — BASIC METABOLIC PANEL
Anion gap: 10 (ref 5–15)
BUN: 36 mg/dL — ABNORMAL HIGH (ref 6–20)
CHLORIDE: 110 mmol/L (ref 101–111)
CO2: 18 mmol/L — AB (ref 22–32)
CREATININE: 1.6 mg/dL — AB (ref 0.44–1.00)
Calcium: 8 mg/dL — ABNORMAL LOW (ref 8.9–10.3)
GFR calc non Af Amer: 32 mL/min — ABNORMAL LOW (ref >60)
GFR, EST AFRICAN AMERICAN: 37 mL/min — AB (ref >60)
Glucose, Bld: 146 mg/dL — ABNORMAL HIGH (ref 65–99)
Potassium: 5.1 mmol/L (ref 3.5–5.1)
Sodium: 138 mmol/L (ref 135–145)

## 2017-05-04 LAB — MRSA PCR SCREENING: MRSA by PCR: NEGATIVE

## 2017-05-04 LAB — INFLUENZA PANEL BY PCR (TYPE A & B)
INFLBPCR: NEGATIVE
Influenza A By PCR: NEGATIVE

## 2017-05-04 LAB — STREP PNEUMONIAE URINARY ANTIGEN: Strep Pneumo Urinary Antigen: NEGATIVE

## 2017-05-04 MED ORDER — HYDROCODONE-ACETAMINOPHEN 5-325 MG PO TABS: 1.0000 | ORAL_TABLET | Freq: Four times a day (QID) | ORAL | Status: DC | PRN

## 2017-05-04 MED ORDER — SODIUM CHLORIDE 0.9 % IV BOLUS (SEPSIS)
2000.0000 mL | Freq: Once | INTRAVENOUS | Status: AC
  Administered 2017-05-04: 2000 mL via INTRAVENOUS

## 2017-05-04 MED ORDER — HYDROCODONE-ACETAMINOPHEN 5-325 MG PO TABS
1.0000 | ORAL_TABLET | Freq: Four times a day (QID) | ORAL | Status: DC | PRN
  Administered 2017-05-04: 1 via ORAL
  Filled 2017-05-04: qty 1

## 2017-05-04 MED ORDER — HYDROCODONE-ACETAMINOPHEN 5-325 MG PO TABS
2.0000 | ORAL_TABLET | Freq: Four times a day (QID) | ORAL | Status: DC | PRN
  Administered 2017-05-05 – 2017-05-09 (×3): 2 via ORAL
  Filled 2017-05-04 (×3): qty 2

## 2017-05-04 NOTE — Evaluation (Signed)
Clinical/Bedside Swallow Evaluation Patient Details  Name: TELISSA PALMISANO MRN: [id number] Date of Birth: 11/14/1950  Today's Date: 05/04/2017 Time: SLP Start Time (ACUTE ONLY): 1423 SLP Stop Time (ACUTE ONLY): 1451 SLP Time Calculation (min) (ACUTE ONLY): 28 min  Past Medical History:  Past Medical History:  Diagnosis Date  . Anxiety   . Arthritis   . CHF (congestive heart failure) (HCC)   . Chronic back pain   . Chronic pain   . COPD (chronic obstructive pulmonary disease) (HCC)   . Coronary artery disease   . Depression   . GERD (gastroesophageal reflux disease)   . Hypertension   . Migraine   . Stroke [facility])    Past Surgical History:  Past Surgical History:  Procedure Laterality Date  . ABDOMINAL HYSTERECTOMY    . breast lump removed    . BREAST SURGERY    . CHOLECYSTECTOMY    . FOOT SURGERY     HPI:  TINA GRUNER is a 67 y.o. female with medical history, per documentation, significant for diastolic CHF, COPD, CAD, hypertension, and prior CVA who was found down at home at approximately 1545 by her son who lives with her.  He called EMS and stated at that time that he last saw her at her usual baseline the previous night at 2200.  EMS apparently noted that the patient would turn her head to look at the staff and was noted to have some right facial droop and left arm weakness.  She was also noted to have some low O2 saturations, but otherwise felt warm. Vital signs are currently stable.  Chest x-ray demonstrates right lower lobe infiltrate    Assessment / Plan / Recommendation Clinical Impression  Pt was provided a clinical swallowing evaluation while seated upright in the bed. Pt reported "I don't remember anything" SLP provided education of recent events and oriented her to time, place and person. Pt was pleasant but note generalized lethargy and inconsistent bilateral facial tremors during oral motor examination. Pt reports frequent difficulty swallowing and "choking" while  eating. Pt consumed thin liquids with consistent wet vocal quailty after trials and inconsistent dealyed coughing. She has dentures but they are not here for this acute stay; extended mastication was noted with regular trials and puree textures were consumed with no overt s/sx of aspriation. Recommend Downgrade to NTL and D2/fine chop diet secondary to s/sx noted during evaluation and altered mentation and lethargy. ST will continue to follow while in acute setting for trials for diet upgrade and education of compensatory strategies. If pt's mentation does not clear consider ordering Speech Language Evaluation.  SLP Visit Diagnosis: Dysphagia, unspecified (R13.10)    Aspiration Risk  Mild aspiration risk;Moderate aspiration risk    Diet Recommendation Dysphagia 2 (Fine chop);Nectar-thick liquid   Liquid Administration via: Cup;No straw Medication Administration: Crushed with puree Supervision: Patient able to self feed;Staff to assist with self feeding;Full supervision/cueing for compensatory strategies Compensations: Minimize environmental distractions;Slow rate;Small sips/bites;Follow solids with liquid Postural Changes: Seated upright at 90 degrees;Remain upright for at least 30 minutes after po intake    Other  Recommendations Oral Care Recommendations: Oral care BID Other Recommendations: Order thickener from pharmacy   Follow up Recommendations        Frequency and Duration min 2x/week  2 weeks       Prognosis Prognosis for Safe Diet Advancement: Good      Swallow Study   General Date of Onset: 05/03/17 HPI: MELADY CHOW is a 67  y.o. female with medical history, per documentation, significant for diastolic CHF, COPD, CAD, hypertension, and prior CVA who was found down at home at approximately 1545 by her son who lives with her.  He called EMS and stated at that time that he last saw her at her usual baseline the previous night at 2200.  EMS apparently noted that the patient  would turn her head to look at the staff and was noted to have some right facial droop and left arm weakness.  She was also noted to have some low O2 saturations, but otherwise felt warm. Vital signs are currently stable.  Chest x-ray demonstrates right lower lobe infiltrate  Type of Study: Bedside Swallow Evaluation Previous Swallow Assessment: none Diet Prior to this Study: NPO Temperature Spikes Noted: No Respiratory Status: Nasal cannula(2 L/mn) History of Recent Intubation: No Behavior/Cognition: Alert;Cooperative;Confused;Lethargic/Drowsy;Requires cueing Oral Cavity Assessment: Within Functional Limits;Dry Oral Care Completed by SLP: Recent completion by staff Oral Cavity - Dentition: Dentures, not available;Edentulous Vision: Functional for self-feeding Self-Feeding Abilities: Able to feed self;Needs assist Patient Positioning: Upright in bed Baseline Vocal Quality: Normal;Breathy Volitional Cough: Strong Volitional Swallow: Able to elicit    Oral/Motor/Sensory Function Overall Oral Motor/Sensory Function: Mild impairment   Ice Chips Ice chips: Within functional limits   Thin Liquid Thin Liquid: Impaired Presentation: Cup;Straw Oral Phase Impairments: Reduced labial seal;Poor awareness of bolus Pharyngeal  Phase Impairments: Suspected delayed Swallow;Wet Vocal Quality;Cough - Delayed    Nectar Thick     Honey Thick     Puree Puree: Within functional limits   Solid        Amelia H. Romie Levee, CCC-SLP Speech Language Pathologist    Solid: Impaired Presentation: Self Fed Oral Phase Impairments: Impaired mastication Oral Phase Functional Implications: Prolonged oral transit        Georgetta Haber 05/04/2017,2:59 PM

## 2017-05-04 NOTE — Progress Notes (Signed)
Lactic acid of 2.6 texted to MD

## 2017-05-04 NOTE — Progress Notes (Addendum)
PROGRESS NOTE                                                                                                                                                                                                             Patient Demographics:    Mariah White, is a 67 y.o. female, DOB - 12-Aug-1950, VOH:[id number]  Admit date - 05/03/2017   Admitting Physician Pratik Hoover Brunette, DO  Outpatient Primary MD for the patient is Kirstie Peri, MD  LOS - 1  Outpatient Specialists: None  Chief Complaint  Patient presents with  . Altered Mental Status       Brief Narrative 67 year old female with history of diastolic CHF, COPD, CAD, prior CVA and hypertension found at home on the floor by her son.  Patient was in her usual state the previous night.  EMS found her to have some facial droop. In the ED patient was septic with  In the ED patient was septic with fever of 101.5 F, hypotensive, hypoxic, tachycardic and elevated leukocytes with elevated lactic acid meeting criteria for sepsis and code sepsis was initiated.  Patient given IV fluid bolus and empiric antibiotics for possible pneumonia and admitted to stepdown unit.   Subjective:   Since seen and examined this morning.  Appears oriented but does not remember how and why she came to the hospital.  Assessment  & Plan :    Principal Problem:   Sepsis (HCC) Suspect due to pneumonia.?  Aspiration.  Empiric antibiotic coverage with vancomycin and, aztreonam and Levaquin.  Narrow once culture results obtained.  Swallow evaluation.   flu PCR negative.  Lactate improving.  Discontinue IV fluids and Foley catheter.  Active Problems: Chronic diastolic CHF No signs of volume overload.  Holding home antihypertensives given low blood pressure.  Last echo from 08/2016 with EF of 75%.     COPD (chronic obstructive pulmonary disease) (HCC) Acute on chronic respiratory failure with hypoxia  (HCC) Mild exacerbation.  Placed on IV steroids and scheduled nebs.  Currently on 2 L via nasal cannula (home regimen)  Hypotension Secondary to stroke.  Home blood pressure medications held.  Acute kidney injury Secondary to sepsis.  Renal function improving this morning.  Will discontinue fluids.   Acute metabolic encephalopathy Possibly due to sepsis versus CVA.  Mental status clearing.  Head CT unremarkable.  She does have some right-sided facial droop.  Follow MRI brain.  Leukocytosis Suspect due to both sepsis and steroid use.  Monitor for now.  Code Status : Full code  Family Communication  : None at bedside  Disposition Plan  : Pending hospital course  Barriers For Discharge : Active symptoms  Consults  : None  Procedures  : Head CT MRI brain  DVT Prophylaxis  :  Lovenox -  Lab Results  Component Value Date   PLT 185 05/04/2017    Antibiotics  :    Anti-infectives (From admission, onward)   Start     Dose/Rate Route Frequency Ordered Stop   05/05/17 1600  levofloxacin (LEVAQUIN) IVPB 750 mg     750 mg 100 mL/hr over 90 Minutes Intravenous Every 48 hours 05/03/17 2123     05/04/17 1700  vancomycin (VANCOCIN) IVPB 1000 mg/200 mL premix     1,000 mg 200 mL/hr over 60 Minutes Intravenous Every 24 hours 05/03/17 2123     05/04/17 0200  aztreonam (AZACTAM) 1 g in dextrose 5 % 50 mL IVPB     1 g 100 mL/hr over 30 Minutes Intravenous Every 8 hours 05/03/17 2123     05/03/17 1630  vancomycin (VANCOCIN) 2,000 mg in sodium chloride 0.9 % 500 mL IVPB     2,000 mg 250 mL/hr over 120 Minutes Intravenous  Once 05/03/17 1603 05/03/17 1825   05/03/17 1600  levofloxacin (LEVAQUIN) IVPB 750 mg     750 mg 100 mL/hr over 90 Minutes Intravenous  Once 05/03/17 1554 05/03/17 1846   05/03/17 1600  aztreonam (AZACTAM) 2 g in dextrose 5 % 50 mL IVPB     2 g 100 mL/hr over 30 Minutes Intravenous  Once 05/03/17 1554 05/03/17 1639   05/03/17 1600  vancomycin (VANCOCIN) IVPB 1000  mg/200 mL premix  Status:  Discontinued     1,000 mg 200 mL/hr over 60 Minutes Intravenous  Once 05/03/17 1554 05/03/17 1603        Objective:   Vitals:   05/04/17 0840 05/04/17 0900 05/04/17 1000 05/04/17 1104  BP:  120/68 110/75   Pulse:  81 86 85  Resp:  12 11 13   Temp:  97.9 F (36.6 C) 98.1 F (36.7 C) 98.2 F (36.8 C)  TempSrc:      SpO2: 96% 94% 93% 92%  Weight:      Height:        Wt Readings from Last 3 Encounters:  05/04/17 86.1 kg (189 lb 13.1 oz)  11/23/16 80.7 kg (178 lb)  10/06/16 81.6 kg (180 lb)     Intake/Output Summary (Last 24 hours) at 05/04/2017 1205 Last data filed at 05/04/2017 0500 Gross per 24 hour  Intake 3627.67 ml  Output 250 ml  Net 3377.67 ml     Physical Exam  Gen: Elderly female appears fatigued, not in distress HEENT: no pallor, moist mucosa, supple neck Chest: Scattered rhonchi bilaterally CVS: N S1&S2, no murmurs, rubs or gallop GI: soft, NT, ND, BS+, Foley + Musculoskeletal: warm, no edema CNS: AAOX-3, right facial droop, no focal deficit.    Data Review:    CBC Recent Labs  Lab 05/03/17 1609 05/04/17 0448  WBC 13.4* 20.5*  HGB 12.8 11.3*  HCT 42.0 36.9  PLT 201 185  MCV 95.0 96.3  MCH 29.0 29.5  MCHC 30.5 30.6  RDW 14.7 15.1  LYMPHSABS 1.3  --   MONOABS 0.7  --   EOSABS 0.0  --  BASOSABS 0.0  --     Chemistries  Recent Labs  Lab 05/03/17 1609 05/04/17 0448  NA 139 138  K 5.8* 5.1  CL 107 110  CO2 22 18*  GLUCOSE 93 146*  BUN 39* 36*  CREATININE 1.96* 1.60*  CALCIUM 8.8* 8.0*  AST 47*  --   ALT 28  --   ALKPHOS 115  --   BILITOT 0.7  --    ------------------------------------------------------------------------------------------------------------------ No results for input(s): CHOL, HDL, LDLCALC, TRIG, CHOLHDL, LDLDIRECT in the last 72 hours.  No results found for: HGBA1C ------------------------------------------------------------------------------------------------------------------ No  results for input(s): TSH, T4TOTAL, T3FREE, THYROIDAB in the last 72 hours.  Invalid input(s): FREET3 ------------------------------------------------------------------------------------------------------------------ No results for input(s): VITAMINB12, FOLATE, FERRITIN, TIBC, IRON, RETICCTPCT in the last 72 hours.  Coagulation profile No results for input(s): INR, PROTIME in the last 168 hours.  No results for input(s): DDIMER in the last 72 hours.  Cardiac Enzymes Recent Labs  Lab 05/03/17 1609  TROPONINI <0.03   ------------------------------------------------------------------------------------------------------------------    Component Value Date/Time   BNP 202.0 (H) 05/03/2017 1610    Inpatient Medications  Scheduled Meds: . ARIPiprazole  5 mg Oral QHS  . calcium-vitamin D  1 tablet Oral Daily  . clopidogrel  75 mg Oral Daily  . enoxaparin (LOVENOX) injection  30 mg Subcutaneous Q24H  . FLUoxetine  20 mg Oral Daily  . ipratropium-albuterol  3 mL Nebulization Q6H  . magnesium oxide  400 mg Oral Daily  . methylPREDNISolone (SOLU-MEDROL) injection  60 mg Intravenous Q8H  . mometasone-formoterol  2 puff Inhalation BID  . pantoprazole  40 mg Oral Daily  . pravastatin  80 mg Oral q1800   Continuous Infusions: . aztreonam Stopped (05/04/17 1122)  . [START ON 05/05/2017] levofloxacin (LEVAQUIN) IV    . vancomycin     PRN Meds:.ondansetron **OR** ondansetron (ZOFRAN) IV, oxyCODONE-acetaminophen  Micro Results Recent Results (from the past 240 hour(s))  Blood Culture (routine x 2)     Status: None (Preliminary result)   Collection Time: 05/03/17  4:09 PM  Result Value Ref Range Status   Specimen Description RIGHT ANTECUBITAL  Final   Special Requests   Final    BOTTLES DRAWN AEROBIC AND ANAEROBIC Blood Culture adequate volume   Culture   Final    NO GROWTH < 24 HOURS Performed at [facility], [address] [place], [place] [address]    Report Status PENDING   Incomplete  Blood Culture (routine x 2)     Status: None (Preliminary result)   Collection Time: 05/03/17  4:10 PM  Result Value Ref Range Status   Specimen Description BLOOD LEFT HAND  Final   Special Requests   Final    BOTTLES DRAWN AEROBIC AND ANAEROBIC Blood Culture adequate volume   Culture   Final    NO GROWTH < 24 HOURS Performed at [facility], [address] [place], [place] [address]    Report Status PENDING  Incomplete  MRSA PCR Screening     Status: None   Collection Time: 05/03/17 10:15 PM  Result Value Ref Range Status   MRSA by PCR NEGATIVE NEGATIVE Final    Comment:        The GeneXpert MRSA Assay (FDA approved for NASAL specimens only), is one component of a comprehensive MRSA colonization surveillance program. It is not intended to diagnose MRSA infection nor to guide or monitor treatment for MRSA infections.     Radiology Reports Ct Head Wo Contrast  Result Date:  05/03/2017 CLINICAL DATA:  Found unresponsive EXAM: CT HEAD WITHOUT CONTRAST TECHNIQUE: Contiguous axial images were obtained from the base of the skull through the vertex without intravenous contrast. COMPARISON:  02/01/2017 FINDINGS: Brain: No acute intracranial abnormality. Specifically, no hemorrhage, hydrocephalus, mass lesion, acute infarction, or significant intracranial injury. Vascular: No hyperdense vessel or unexpected calcification. Skull: No acute calvarial abnormality. Sinuses/Orbits: Visualized paranasal sinuses and mastoids clear. Orbital soft tissues unremarkable. Other: None IMPRESSION: No acute intracranial abnormality. Electronically Signed   By: Charlett Nose M.D.   On: 05/03/2017 18:52   Dg Chest Port 1 View  Result Date: 05/03/2017 CLINICAL DATA:  Patient found unresponsive. EXAM: PORTABLE CHEST 1 VIEW COMPARISON:  Aug 07, 2016 FINDINGS: There is focal infiltrate in the right lower lung, likely pneumonia or aspiration. The cardiomediastinal silhouette is normal given technique. No  pneumothorax. No other acute abnormalities. IMPRESSION: New infiltrate in the right lower lung, likely pneumonia or aspiration. Recommend follow-up to resolution. Electronically Signed   By: Gerome Sam III M.D   On: 05/03/2017 16:12    Time Spent in minutes  35   Auden Tatar M.D on 05/04/2017 at 12:05 PM  Between 7am to 7pm - Pager - [phone]  After 7pm go to [email or web address] - password [facility]  [facility] -  Office  [phone]

## 2017-05-05 ENCOUNTER — Inpatient Hospital Stay (HOSPITAL_COMMUNITY): Payer: Medicare Other

## 2017-05-05 DIAGNOSIS — J9601 Acute respiratory failure with hypoxia: Secondary | ICD-10-CM

## 2017-05-05 DIAGNOSIS — G9341 Metabolic encephalopathy: Secondary | ICD-10-CM

## 2017-05-05 LAB — BLOOD GAS, ARTERIAL
ACID-BASE DEFICIT: 1.4 mmol/L (ref 0.0–2.0)
BICARBONATE: 22.9 mmol/L (ref 20.0–28.0)
DRAWN BY: 234301
O2 CONTENT: 15 L/min
O2 SAT: 82 %
PCO2 ART: 39.8 mmHg (ref 32.0–48.0)
Patient temperature: 37
pH, Arterial: 7.379 (ref 7.350–7.450)
pO2, Arterial: 47.2 mmHg — ABNORMAL LOW (ref 83.0–108.0)

## 2017-05-05 LAB — BASIC METABOLIC PANEL
ANION GAP: 12 (ref 5–15)
BUN: 32 mg/dL — AB (ref 6–20)
CALCIUM: 9.2 mg/dL (ref 8.9–10.3)
CO2: 18 mmol/L — ABNORMAL LOW (ref 22–32)
Chloride: 110 mmol/L (ref 101–111)
Creatinine, Ser: 1.12 mg/dL — ABNORMAL HIGH (ref 0.44–1.00)
GFR calc Af Amer: 58 mL/min — ABNORMAL LOW (ref >60)
GFR calc non Af Amer: 50 mL/min — ABNORMAL LOW (ref >60)
Glucose, Bld: 136 mg/dL — ABNORMAL HIGH (ref 65–99)
POTASSIUM: 4.5 mmol/L (ref 3.5–5.1)
SODIUM: 140 mmol/L (ref 135–145)

## 2017-05-05 LAB — LEGIONELLA PNEUMOPHILA SEROGP 1 UR AG: L. pneumophila Serogp 1 Ur Ag: NEGATIVE

## 2017-05-05 LAB — URINE CULTURE: CULTURE: NO GROWTH

## 2017-05-05 LAB — CBC
HCT: 36.7 % (ref 36.0–46.0)
Hemoglobin: 11.6 g/dL — ABNORMAL LOW (ref 12.0–15.0)
MCH: 29.4 pg (ref 26.0–34.0)
MCHC: 31.6 g/dL (ref 30.0–36.0)
MCV: 93.1 fL (ref 78.0–100.0)
PLATELETS: 202 10*3/uL (ref 150–400)
RBC: 3.94 MIL/uL (ref 3.87–5.11)
RDW: 15.1 % (ref 11.5–15.5)
WBC: 19.1 10*3/uL — AB (ref 4.0–10.5)

## 2017-05-05 LAB — HIV ANTIBODY (ROUTINE TESTING W REFLEX): HIV SCREEN 4TH GENERATION: NONREACTIVE

## 2017-05-05 MED ORDER — ENOXAPARIN SODIUM 40 MG/0.4ML ~~LOC~~ SOLN
40.0000 mg | SUBCUTANEOUS | Status: DC
  Administered 2017-05-05 – 2017-05-16 (×12): 40 mg via SUBCUTANEOUS
  Filled 2017-05-05 (×11): qty 0.4

## 2017-05-05 MED ORDER — IPRATROPIUM-ALBUTEROL 0.5-2.5 (3) MG/3ML IN SOLN: 3.0000 mL | Freq: Four times a day (QID) | RESPIRATORY_TRACT | Status: DC

## 2017-05-05 MED ORDER — FUROSEMIDE 10 MG/ML IJ SOLN
40.0000 mg | Freq: Once | INTRAMUSCULAR | Status: AC
  Administered 2017-05-05: 40 mg via INTRAVENOUS
  Filled 2017-05-05: qty 4

## 2017-05-05 MED ORDER — IPRATROPIUM-ALBUTEROL 0.5-2.5 (3) MG/3ML IN SOLN
3.0000 mL | RESPIRATORY_TRACT | Status: DC | PRN
  Administered 2017-05-05: 3 mL via RESPIRATORY_TRACT

## 2017-05-05 MED ORDER — IPRATROPIUM-ALBUTEROL 0.5-2.5 (3) MG/3ML IN SOLN
3.0000 mL | RESPIRATORY_TRACT | Status: DC
  Administered 2017-05-05 – 2017-05-08 (×20): 3 mL via RESPIRATORY_TRACT
  Filled 2017-05-05 (×20): qty 3

## 2017-05-05 NOTE — Progress Notes (Signed)
PROGRESS NOTE                                                                                                                                                                                                             Patient Demographics:    Mariah White, is a 67 y.o. female, DOB - 1950-04-10, QTM:[id number]  Admit date - 05/03/2017   Admitting Physician Pratik Hoover Brunette, DO  Outpatient Primary MD for the patient is Kirstie Peri, MD  LOS - 2  Outpatient Specialists: None  Chief Complaint  Patient presents with  . Altered Mental Status       Brief Narrative 67 year old female with history of diastolic CHF, COPD, CAD, prior CVA and hypertension found at home on the floor by her son.  Patient was in her usual state the previous night.  EMS found her to have some facial droop. In the ED patient was septic with  In the ED patient was septic with fever of 101.5 F, hypotensive, hypoxic, tachycardic and elevated leukocytes with elevated lactic acid meeting criteria for sepsis and code sepsis was initiated.  Patient given IV fluid bolus and empiric antibiotics for possible pneumonia and admitted to stepdown unit.   Subjective:   Still confused.  Requiring almost 8 L via nasal cannula.  No other overnight events.  Assessment  & Plan :    Principal Problem:   Sepsis (HCC) Suspicious for aspiration pneumonia.  Blood cultures on admission negative.  Discontinue vancomycin.  Seen by SLP and recommends mild to moderate aspiration risk and placed on dysphagia level 2 diet with nectar thick liquid. Sepsis resolved.  Flu PCR negative.    Active Problems: Chronic diastolic CHF No signs of volume overload.  Holding home antihypertensives given low blood pressure.  Last echo from 08/2016 with EF of 75%.     COPD (chronic obstructive pulmonary disease) (HCC) Acute on chronic respiratory failure with hypoxia (HCC) Likely due to  pneumonia and mild COPD exacerbation.  To new O2 via nasal cannula, IV steroid and scheduled nebs.    Hypotension Secondary to stroke.  Home blood pressure medications held.  Blood pressure currently stable.  Acute kidney injury Secondary to sepsis.  Renal function embolized in a.m. labs.   Acute toxic metabolic encephalopathy Possibly due to sepsis .  Still confused.  CT and MRI negative  for acute findings of stroke.   Leukocytosis Management of sepsis and steroid use.  Monitor for now   Code Status : Full code  Family Communication  : None at bedside  Disposition Plan  : Pending hospital course  Barriers For Discharge : Active symptoms  Consults  : None  Procedures  : Head CT MRI brain  DVT Prophylaxis  :  Lovenox -  Lab Results  Component Value Date   PLT 202 05/05/2017    Antibiotics  :    Anti-infectives (From admission, onward)   Start     Dose/Rate Route Frequency Ordered Stop   05/05/17 1600  levofloxacin (LEVAQUIN) IVPB 750 mg     750 mg 100 mL/hr over 90 Minutes Intravenous Every 48 hours 05/03/17 2123     05/04/17 1700  vancomycin (VANCOCIN) IVPB 1000 mg/200 mL premix     1,000 mg 200 mL/hr over 60 Minutes Intravenous Every 24 hours 05/03/17 2123     05/04/17 0200  aztreonam (AZACTAM) 1 g in dextrose 5 % 50 mL IVPB     1 g 100 mL/hr over 30 Minutes Intravenous Every 8 hours 05/03/17 2123     05/03/17 1630  vancomycin (VANCOCIN) 2,000 mg in sodium chloride 0.9 % 500 mL IVPB     2,000 mg 250 mL/hr over 120 Minutes Intravenous  Once 05/03/17 1603 05/03/17 1825   05/03/17 1600  levofloxacin (LEVAQUIN) IVPB 750 mg     750 mg 100 mL/hr over 90 Minutes Intravenous  Once 05/03/17 1554 05/03/17 1846   05/03/17 1600  aztreonam (AZACTAM) 2 g in dextrose 5 % 50 mL IVPB     2 g 100 mL/hr over 30 Minutes Intravenous  Once 05/03/17 1554 05/03/17 1639   05/03/17 1600  vancomycin (VANCOCIN) IVPB 1000 mg/200 mL premix  Status:  Discontinued     1,000 mg 200  mL/hr over 60 Minutes Intravenous  Once 05/03/17 1554 05/03/17 1603        Objective:   Vitals:   05/05/17 1000 05/05/17 1100 05/05/17 1116 05/05/17 1223  BP: (!) 136/92 (!) 144/80    Pulse: 76 97 94   Resp: 17 (!) 21 17   Temp:   98.4 F (36.9 C)   TempSrc:   Oral   SpO2: 90%  90% 90%  Weight:      Height:        Wt Readings from Last 3 Encounters:  05/05/17 86.9 kg (191 lb 9.3 oz)  11/23/16 80.7 kg (178 lb)  10/06/16 81.6 kg (180 lb)     Intake/Output Summary (Last 24 hours) at 05/05/2017 1332 Last data filed at 05/05/2017 1247 Gross per 24 hour  Intake 360 ml  Output 1301 ml  Net -941 ml     Physical Exam : Elderly female not in distress HEENT: Moist mucosa, supple neck Chest: Diffuse wheezing bilaterally CVS: Normal S1 and S2, no murmurs GI: Soft, nondistended, nontender Muscular skeletal: Warm, no edema CNS: AAO x1, confused, nonfocal    Data Review:    CBC Recent Labs  Lab 05/03/17 1609 05/04/17 0448 05/05/17 0544  WBC 13.4* 20.5* 19.1*  HGB 12.8 11.3* 11.6*  HCT 42.0 36.9 36.7  PLT 201 185 202  MCV 95.0 96.3 93.1  MCH 29.0 29.5 29.4  MCHC 30.5 30.6 31.6  RDW 14.7 15.1 15.1  LYMPHSABS 1.3  --   --   MONOABS 0.7  --   --   EOSABS 0.0  --   --  BASOSABS 0.0  --   --     Chemistries  Recent Labs  Lab 05/03/17 1609 05/04/17 0448 05/05/17 0544  NA 139 138 140  K 5.8* 5.1 4.5  CL 107 110 110  CO2 22 18* 18*  GLUCOSE 93 146* 136*  BUN 39* 36* 32*  CREATININE 1.96* 1.60* 1.12*  CALCIUM 8.8* 8.0* 9.2  AST 47*  --   --   ALT 28  --   --   ALKPHOS 115  --   --   BILITOT 0.7  --   --    ------------------------------------------------------------------------------------------------------------------ No results for input(s): CHOL, HDL, LDLCALC, TRIG, CHOLHDL, LDLDIRECT in the last 72 hours.  No results found for: HGBA1C ------------------------------------------------------------------------------------------------------------------ No  results for input(s): TSH, T4TOTAL, T3FREE, THYROIDAB in the last 72 hours.  Invalid input(s): FREET3 ------------------------------------------------------------------------------------------------------------------ No results for input(s): VITAMINB12, FOLATE, FERRITIN, TIBC, IRON, RETICCTPCT in the last 72 hours.  Coagulation profile No results for input(s): INR, PROTIME in the last 168 hours.  No results for input(s): DDIMER in the last 72 hours.  Cardiac Enzymes Recent Labs  Lab 05/03/17 1609  TROPONINI <0.03   ------------------------------------------------------------------------------------------------------------------    Component Value Date/Time   BNP 202.0 (H) 05/03/2017 1610    Inpatient Medications  Scheduled Meds: . calcium-vitamin D  1 tablet Oral Daily  . clopidogrel  75 mg Oral Daily  . enoxaparin (LOVENOX) injection  40 mg Subcutaneous Q24H  . ipratropium-albuterol  3 mL Nebulization Q4H  . magnesium oxide  400 mg Oral Daily  . methylPREDNISolone (SOLU-MEDROL) injection  60 mg Intravenous Q8H  . mometasone-formoterol  2 puff Inhalation BID  . pantoprazole  40 mg Oral Daily  . pravastatin  80 mg Oral q1800   Continuous Infusions: . aztreonam 1 g (05/05/17 1111)  . levofloxacin (LEVAQUIN) IV    . vancomycin Stopped (05/04/17 1704)   PRN Meds:.HYDROcodone-acetaminophen, HYDROcodone-acetaminophen, ipratropium-albuterol, ondansetron **OR** ondansetron (ZOFRAN) IV  Micro Results Recent Results (from the past 240 hour(s))  Urine culture     Status: None   Collection Time: 05/03/17  3:54 PM  Result Value Ref Range Status   Specimen Description   Final    URINE, CLEAN CATCH Performed at [facility], [address], [place], [place] [address]    Special Requests   Final    NONE Performed at [facility], [address] [place], [place] [address]    Culture   Final    NO GROWTH Performed at [facility], [address], [place], [place]  [address]    Report Status 05/05/2017 FINAL  Final  Blood Culture (routine x 2)     Status: None (Preliminary result)   Collection Time: 05/03/17  4:09 PM  Result Value Ref Range Status   Specimen Description RIGHT ANTECUBITAL  Final   Special Requests   Final    BOTTLES DRAWN AEROBIC AND ANAEROBIC Blood Culture adequate volume   Culture   Final    NO GROWTH 2 DAYS Performed at [facility], [address] [place], [place] [address]    Report Status PENDING  Incomplete  Blood Culture (routine x 2)     Status: None (Preliminary result)   Collection Time: 05/03/17  4:10 PM  Result Value Ref Range Status   Specimen Description BLOOD LEFT HAND  Final   Special Requests   Final    BOTTLES DRAWN AEROBIC AND ANAEROBIC Blood Culture adequate volume   Culture   Final    NO GROWTH 2  DAYS Performed at [facility], [address] [place], [place] [address]    Report Status PENDING  Incomplete  MRSA PCR Screening     Status: None   Collection Time: 05/03/17 10:15 PM  Result Value Ref Range Status   MRSA by PCR NEGATIVE NEGATIVE Final    Comment:        The GeneXpert MRSA Assay (FDA approved for NASAL specimens only), is one component of a comprehensive MRSA colonization surveillance program. It is not intended to diagnose MRSA infection nor to guide or monitor treatment for MRSA infections.     Radiology Reports Ct Head Wo Contrast  Result Date: 05/03/2017 CLINICAL DATA:  Found unresponsive EXAM: CT HEAD WITHOUT CONTRAST TECHNIQUE: Contiguous axial images were obtained from the base of the skull through the vertex without intravenous contrast. COMPARISON:  02/01/2017 FINDINGS: Brain: No acute intracranial abnormality. Specifically, no hemorrhage, hydrocephalus, mass lesion, acute infarction, or significant intracranial injury. Vascular: No hyperdense vessel or unexpected calcification. Skull: No acute calvarial abnormality. Sinuses/Orbits: Visualized paranasal sinuses and mastoids clear.  Orbital soft tissues unremarkable. Other: None IMPRESSION: No acute intracranial abnormality. Electronically Signed   By: Charlett Nose M.D.   On: 05/03/2017 18:52   Mr Brain Wo Contrast  Result Date: 05/04/2017 CLINICAL DATA:  Altered level of consciousness, unexplained. Patient was found unconscious. EXAM: MRI HEAD WITHOUT CONTRAST TECHNIQUE: Multiplanar, multiecho pulse sequences of the brain and surrounding structures were obtained without intravenous contrast. COMPARISON:  Head CT from yesterday FINDINGS: Brain: No acute infarction, hemorrhage, hydrocephalus, extra-axial collection or mass lesion. 4 or 5 small areas of cortically based FLAIR signal abnormality with subtle volume loss along the left cerebral hemisphere, compatible with remote small MCA/watershed distribution infarcts. Patient has history of stroke. Mild chronic small vessel ischemic type changes in the cerebral white matter. No atrophy or chronic hemorrhagic injury. Vascular: Major flow voids are preserved. Skull and upper cervical spine: No evidence of marrow lesion. Sinuses/Orbits: Bilateral cataract resection. IMPRESSION: 1. No acute finding. 2. Small scattered remote cortical infarcts in the left MCA/watershed distribution. Electronically Signed   By: Marnee Spring M.D.   On: 05/04/2017 13:16   Dg Chest Port 1 View  Result Date: 05/05/2017 CLINICAL DATA:  Hypoxemia.  Sepsis. EXAM: PORTABLE CHEST 1 VIEW COMPARISON:  05/03/2017 FINDINGS: The cardiac silhouette is upper limits of normal in size. Lung volumes are improved compared to the prior study. There is worsening of previously described right lung airspace opacity, now diffusely involving the right lung. New diffuse left lung airspace opacity is also present. No sizable pleural effusion or pneumothorax is identified. IMPRESSION: Marked worsening of airspace disease, now with diffuse bilateral involvement. Electronically Signed   By: Sebastian Ache M.D.   On: 05/05/2017 08:49   Dg  Chest Port 1 View  Result Date: 05/03/2017 CLINICAL DATA:  Patient found unresponsive. EXAM: PORTABLE CHEST 1 VIEW COMPARISON:  Aug 07, 2016 FINDINGS: There is focal infiltrate in the right lower lung, likely pneumonia or aspiration. The cardiomediastinal silhouette is normal given technique. No pneumothorax. No other acute abnormalities. IMPRESSION: New infiltrate in the right lower lung, likely pneumonia or aspiration. Recommend follow-up to resolution. Electronically Signed   By: Gerome Sam III M.D   On: 05/03/2017 16:12    Time Spent in minutes  35   Dewitt Judice M.D on 05/05/2017 at 1:32 PM  Between 7am to 7pm - Pager - [phone]  After 7pm go to [email or web address] - password [email or web address]  [facility] -  Office  [phone]     

## 2017-05-05 NOTE — Progress Notes (Signed)
Pt sats 88-90% on 15L, resp 22-28  MD notified of patient condition, orders received, pt started on bipap by resp.  O2 sats up to 94%.

## 2017-05-06 LAB — BLOOD GAS, ARTERIAL
ACID-BASE DEFICIT: 0.2 mmol/L (ref 0.0–2.0)
Bicarbonate: 23.9 mmol/L (ref 20.0–28.0)
DELIVERY SYSTEMS: POSITIVE
DRAWN BY: 23534
Expiratory PAP: 8
FIO2: 0.7
Inspiratory PAP: 16
O2 Saturation: 98.3 %
Patient temperature: 37
pCO2 arterial: 46.9 mmHg (ref 32.0–48.0)
pH, Arterial: 7.343 — ABNORMAL LOW (ref 7.350–7.450)
pO2, Arterial: 140 mmHg — ABNORMAL HIGH (ref 83.0–108.0)

## 2017-05-06 LAB — CBC
HEMATOCRIT: 37.5 % (ref 36.0–46.0)
HEMOGLOBIN: 11.9 g/dL — AB (ref 12.0–15.0)
MCH: 29.3 pg (ref 26.0–34.0)
MCHC: 31.7 g/dL (ref 30.0–36.0)
MCV: 92.4 fL (ref 78.0–100.0)
Platelets: 215 10*3/uL (ref 150–400)
RBC: 4.06 MIL/uL (ref 3.87–5.11)
RDW: 15.1 % (ref 11.5–15.5)
WBC: 16.9 10*3/uL — ABNORMAL HIGH (ref 4.0–10.5)

## 2017-05-06 MED ORDER — DEXTROSE 5 % IV SOLN
1.0000 g | Freq: Three times a day (TID) | INTRAVENOUS | Status: DC
  Administered 2017-05-06 – 2017-05-09 (×9): 1 g via INTRAVENOUS
  Filled 2017-05-06 (×11): qty 1

## 2017-05-06 MED ORDER — ORAL CARE MOUTH RINSE
15.0000 mL | Freq: Two times a day (BID) | OROMUCOSAL | Status: DC
  Administered 2017-05-06 – 2017-05-17 (×18): 15 mL via OROMUCOSAL

## 2017-05-06 MED ORDER — METOPROLOL TARTRATE 5 MG/5ML IV SOLN: 5.0000 mg | INTRAVENOUS | Status: DC | PRN

## 2017-05-06 MED ORDER — LEVOFLOXACIN IN D5W 500 MG/100ML IV SOLN
500.0000 mg | INTRAVENOUS | Status: DC
  Administered 2017-05-06 – 2017-05-12 (×7): 500 mg via INTRAVENOUS
  Filled 2017-05-06 (×7): qty 100

## 2017-05-06 MED ORDER — FUROSEMIDE 10 MG/ML IJ SOLN
80.0000 mg | Freq: Once | INTRAMUSCULAR | Status: AC
  Administered 2017-05-06: 80 mg via INTRAVENOUS
  Filled 2017-05-06: qty 8

## 2017-05-06 MED ORDER — CHLORHEXIDINE GLUCONATE 0.12 % MT SOLN
15.0000 mL | Freq: Two times a day (BID) | OROMUCOSAL | Status: DC
  Administered 2017-05-06 – 2017-05-17 (×20): 15 mL via OROMUCOSAL
  Filled 2017-05-06 (×16): qty 15

## 2017-05-06 NOTE — Progress Notes (Signed)
Pharmacy Antibiotic Note  Mariah White is a 67 y.o. female admitted on 05/03/2017 with sepsis.  Pharmacy has been consulted for levaquin and aztreonam dosing.  Plan: Aztreonam 1gm IV q8 hours Change levaquin to 500 mg IV q24 hours for improved renal function F/u renal function, cultures and clinical course  Height: 5\' 2"  (157.5 cm) Weight: 180 lb 16 oz (82.1 kg) IBW/kg (Calculated) : 50.1  Temp (24hrs), Avg:99.2 F (37.3 C), Min:98.4 F (36.9 C), Max:99.8 F (37.7 C)  Recent Labs  Lab 05/03/17 1602 05/03/17 1609 05/04/17 0448 05/04/17 1015 05/05/17 0544 05/06/17 0419  WBC  --  13.4* 20.5*  --  19.1* 16.9*  CREATININE  --  1.96* 1.60*  --  1.12*  --   LATICACIDVEN 1.29  --  2.6* 2.1*  --   --     Estimated Creatinine Clearance: 48.4 mL/min (A) (by C-G formula based on SCr of 1.12 mg/dL (H)).    Allergies  Allergen Reactions  . Penicillins Itching    Antimicrobials this admission: aztreonam 1/31 >>  LVQ 1/31 >>  Vanc  1/31>>2/1  Microbiology results: 1/31 BCx: ngtd 1/31 UCx: ng final  1/31 MRSA PCR: (-)  Thank you for allowing pharmacy to be a part of this patient's care.  Talbert Cage Poteet 05/06/2017 10:57 AM

## 2017-05-06 NOTE — Consult Note (Addendum)
Consult requested by: [facility] hospitalist, Dr.Dhungel Consult requested for: Respiratory failure  HPI: This is a 67 year old who was found "down at home.  She lives with her son and he called EMS.  He did notice that she would turn her head to look at him but she had right facial droop and left arm weakness.  She had low oxygen level.  She was felt to be septic.  Chest x-ray showed right lower lobe infiltrate.  At baseline she has COPD has coronary disease and congestive heart failure.  She has maintained a low oxygen level and is now on BiPAP.  History is from the medical record because there is no family available and she cannot give me any history  Past Medical History:  Diagnosis Date  . Anxiety   . Arthritis   . CHF (congestive heart failure) (HCC)   . Chronic back pain   . Chronic pain   . COPD (chronic obstructive pulmonary disease) (HCC)   . Coronary artery disease   . Depression   . GERD (gastroesophageal reflux disease)   . Hypertension   . Migraine   . Stroke [facility])      Family History  Problem Relation Age of Onset  . Bipolar disorder Daughter      Social History   Socioeconomic History  . Marital status: Single    Spouse name: None  . Number of children: None  . Years of education: None  . Highest education level: None  Social Needs  . Financial resource strain: None  . Food insecurity - worry: None  . Food insecurity - inability: None  . Transportation needs - medical: None  . Transportation needs - non-medical: None  Occupational History  . None  Tobacco Use  . Smoking status: Current Every Day Smoker    Packs/day: 1.00    Types: Cigarettes  . Smokeless tobacco: Never Used  Substance and Sexual Activity  . Alcohol use: No  . Drug use: No  . Sexual activity: Not Currently  Other Topics Concern  . None  Social History Narrative  . None     ROS: Unobtainable    Objective: Vital signs in last 24 hours: Temp:  [98.4 F (36.9 C)-99.8 F (37.7  C)] 99.4 F (37.4 C) (02/03 0756) Pulse Rate:  [76-116] 112 (02/03 0837) Resp:  [16-22] 20 (02/03 0837) BP: (119-156)/(62-100) 156/91 (02/03 0700) SpO2:  [88 %-99 %] 97 % (02/03 0837) FiO2 (%):  [70 %] 70 % (02/03 0832) Weight:  [82.1 kg (180 lb 16 oz)] 82.1 kg (180 lb 16 oz) (02/03 0500) Weight change: -4.8 kg (-9.3 oz) Last BM Date: 05/05/17  Intake/Output from previous day: 02/02 0701 - 02/03 0700 In: 60 [P.O.:60] Out: 3351 [Urine:3351]  PHYSICAL EXAM Constitutional: She is awake and will look at me on BiPAP.  Eyes: Pupils react.  Ears nose mouth and throat: She appears to hear me.  Throat sitting through the BiPAP mask looks clear.  Cardiovascular: Her heart is regular with normal heart sounds respiratory: She has significant rales and rhonchi on the right.  Gastrointestinal: Her abdomen is soft with no masses.  Skin: Skin turgor still poor.  Neurological: Cannot assess psychiatric: Cannot assess   Lab Results: Basic Metabolic Panel: Recent Labs    05/04/17 0448 05/05/17 0544  NA 138 140  K 5.1 4.5  CL 110 110  CO2 18* 18*  GLUCOSE 146* 136*  BUN 36* 32*  CREATININE 1.60* 1.12*  CALCIUM 8.0* 9.2   Liver  Function Tests: Recent Labs    05/03/17 1609  AST 47*  ALT 28  ALKPHOS 115  BILITOT 0.7  PROT 6.9  ALBUMIN 3.2*   No results for input(s): LIPASE, AMYLASE in the last 72 hours. No results for input(s): AMMONIA in the last 72 hours. CBC: Recent Labs    05/03/17 1609  05/05/17 0544 05/06/17 0419  WBC 13.4*   < > 19.1* 16.9*  NEUTROABS 11.3  --   --   --   HGB 12.8   < > 11.6* 11.9*  HCT 42.0   < > 36.7 37.5  MCV 95.0   < > 93.1 92.4  PLT 201   < > 202 215   < > = values in this interval not displayed.   Cardiac Enzymes: Recent Labs    05/03/17 1609  TROPONINI <0.03   BNP: No results for input(s): PROBNP in the last 72 hours. D-Dimer: No results for input(s): DDIMER in the last 72 hours. CBG: No results for input(s): GLUCAP in the last 72  hours. Hemoglobin A1C: No results for input(s): HGBA1C in the last 72 hours. Fasting Lipid Panel: No results for input(s): CHOL, HDL, LDLCALC, TRIG, CHOLHDL, LDLDIRECT in the last 72 hours. Thyroid Function Tests: No results for input(s): TSH, T4TOTAL, FREET4, T3FREE, THYROIDAB in the last 72 hours. Anemia Panel: No results for input(s): VITAMINB12, FOLATE, FERRITIN, TIBC, IRON, RETICCTPCT in the last 72 hours. Coagulation: No results for input(s): LABPROT, INR in the last 72 hours. Urine Drug Screen: Drugs of Abuse  No results found for: LABOPIA, COCAINSCRNUR, LABBENZ, AMPHETMU, THCU, LABBARB  Alcohol Level: No results for input(s): ETH in the last 72 hours. Urinalysis: Recent Labs    05/03/17 1554  COLORURINE YELLOW  LABSPEC 1.014  PHURINE 5.0  GLUCOSEU NEGATIVE  HGBUR NEGATIVE  BILIRUBINUR NEGATIVE  KETONESUR NEGATIVE  PROTEINUR NEGATIVE  NITRITE NEGATIVE  LEUKOCYTESUR NEGATIVE   Misc. Labs:   ABGS: Recent Labs    05/05/17 1800  PHART 7.379  PO2ART 47.2*  HCO3 22.9     MICROBIOLOGY: Recent Results (from the past 240 hour(s))  Urine culture     Status: None   Collection Time: 05/03/17  3:54 PM  Result Value Ref Range Status   Specimen Description   Final    URINE, CLEAN CATCH Performed at [facility], [address], [place], [place] [address]    Special Requests   Final    NONE Performed at [facility], [address] [place], [place] [address]    Culture   Final    NO GROWTH Performed at [facility], [address] [place], [place] [address]    Report Status 05/05/2017 FINAL  Final  Blood Culture (routine x 2)     Status: None (Preliminary result)   Collection Time: 05/03/17  4:09 PM  Result Value Ref Range Status   Specimen Description RIGHT ANTECUBITAL  Final   Special Requests   Final    BOTTLES DRAWN AEROBIC AND ANAEROBIC Blood Culture adequate volume   Culture   Final    NO GROWTH 3 DAYS Performed at [facility], [address], [place], [place] [address]    Report Status PENDING  Incomplete  Blood Culture (routine x 2)     Status: None (Preliminary result)   Collection Time: 05/03/17  4:10 PM  Result Value Ref Range Status   Specimen Description BLOOD LEFT HAND  Final   Special Requests   Final    BOTTLES  DRAWN AEROBIC AND ANAEROBIC Blood Culture adequate volume   Culture   Final    NO GROWTH 3 DAYS Performed at [facility], [address] [place], [place] [address]    Report Status PENDING  Incomplete  MRSA PCR Screening     Status: None   Collection Time: 05/03/17 10:15 PM  Result Value Ref Range Status   MRSA by PCR NEGATIVE NEGATIVE Final    Comment:        The GeneXpert MRSA Assay (FDA approved for NASAL specimens only), is one component of a comprehensive MRSA colonization surveillance program. It is not intended to diagnose MRSA infection nor to guide or monitor treatment for MRSA infections.     Studies/Results: Mr Brain Wo Contrast  Result Date: 05/04/2017 CLINICAL DATA:  Altered level of consciousness, unexplained. Patient was found unconscious. EXAM: MRI HEAD WITHOUT CONTRAST TECHNIQUE: Multiplanar, multiecho pulse sequences of the brain and surrounding structures were obtained without intravenous contrast. COMPARISON:  Head CT from yesterday FINDINGS: Brain: No acute infarction, hemorrhage, hydrocephalus, extra-axial collection or mass lesion. 4 or 5 small areas of cortically based FLAIR signal abnormality with subtle volume loss along the left cerebral hemisphere, compatible with remote small MCA/watershed distribution infarcts. Patient has history of stroke. Mild chronic small vessel ischemic type changes in the cerebral white matter. No atrophy or chronic hemorrhagic injury. Vascular: Major flow voids are preserved. Skull and upper cervical spine: No evidence of marrow lesion. Sinuses/Orbits: Bilateral cataract resection. IMPRESSION: 1. No acute finding. 2. Small scattered remote cortical  infarcts in the left MCA/watershed distribution. Electronically Signed   By: Marnee Spring M.D.   On: 05/04/2017 13:16   Dg Chest Port 1 View  Result Date: 05/05/2017 CLINICAL DATA:  Hypoxemia.  Sepsis. EXAM: PORTABLE CHEST 1 VIEW COMPARISON:  05/03/2017 FINDINGS: The cardiac silhouette is upper limits of normal in size. Lung volumes are improved compared to the prior study. There is worsening of previously described right lung airspace opacity, now diffusely involving the right lung. New diffuse left lung airspace opacity is also present. No sizable pleural effusion or pneumothorax is identified. IMPRESSION: Marked worsening of airspace disease, now with diffuse bilateral involvement. Electronically Signed   By: Sebastian Ache M.D.   On: 05/05/2017 08:49    Medications:  Prior to Admission:  Medications Prior to Admission  Medication Sig Dispense Refill Last Dose  . albuterol (PROVENTIL HFA;VENTOLIN HFA) 108 (90 BASE) MCG/ACT inhaler Inhale 2 puffs into the lungs 2 (two) times daily.   unknown  . ALPRAZolam (XANAX) 1 MG tablet Take 1 tablet (1 mg total) 2 (two) times daily by mouth. 60 tablet 2 05/02/2017 at Unknown time  . amLODipine (NORVASC) 10 MG tablet Take 10 mg by mouth daily.    05/02/2017 at Unknown time  . ARIPiprazole (ABILIFY) 5 MG tablet Take 1 tablet (5 mg total) at bedtime by mouth. 30 tablet 2 05/02/2017 at Unknown time  . clopidogrel (PLAVIX) 75 MG tablet Take 75 mg by mouth daily.   05/02/2017 at Unknown time  . FLUoxetine (PROZAC) 20 MG capsule Take 1 capsule (20 mg total) daily by mouth. 30 capsule 2 05/02/2017 at Unknown time  . Fluticasone-Salmeterol (ADVAIR) 250-50 MCG/DOSE AEPB Inhale 1 puff into the lungs 2 (two) times daily.   05/02/2017 at Unknown time  . furosemide (LASIX) 40 MG tablet TAKE 1 AND 1/2 TABLETS BY MOUTH DAILY 135 tablet 2 05/02/2017 at Unknown time  . gabapentin (NEURONTIN) 600 MG tablet Take 600 mg 3 (three)  times daily by mouth.  3 05/02/2017 at Unknown time  .  lisinopril (PRINIVIL,ZESTRIL) 40 MG tablet Take 40 mg by mouth daily.   05/02/2017 at Unknown time  . metoprolol succinate (TOPROL-XL) 50 MG 24 hr tablet Take 25 mg by mouth daily. Take with or immediately following a meal.   05/02/2017 at Unknown time  . oxyCODONE-acetaminophen (PERCOCET/ROXICET) 5-325 MG tablet Take 1-2 tablets by mouth every 4 (four) hours as needed for severe pain.   05/02/2017 at Unknown time  . pantoprazole (PROTONIX) 40 MG tablet Take 40 mg by mouth daily.   05/02/2017 at Unknown time  . pravastatin (PRAVACHOL) 80 MG tablet Take 80 mg by mouth daily.   05/02/2017 at Unknown time  . prazosin (MINIPRESS) 2 MG capsule Take 1 capsule (2 mg total) at bedtime by mouth. 30 capsule 2 05/02/2017 at Unknown time  . Calcium Carb-Cholecalciferol (CALCIUM 600-D PO) Take 1 tablet by mouth daily.   unknown  . Magnesium Oxide 400 (240 Mg) MG TABS Take 1 tablet (400 mg total) by mouth daily. 30 tablet 6 unknown  . Omega-3 Fatty Acids (FISH OIL) 1000 MG CAPS Take by mouth 2 (two) times daily.   unknown   Scheduled: . calcium-vitamin D  1 tablet Oral Daily  . clopidogrel  75 mg Oral Daily  . enoxaparin (LOVENOX) injection  40 mg Subcutaneous Q24H  . ipratropium-albuterol  3 mL Nebulization Q4H  . magnesium oxide  400 mg Oral Daily  . methylPREDNISolone (SOLU-MEDROL) injection  60 mg Intravenous Q8H  . mometasone-formoterol  2 puff Inhalation BID  . pantoprazole  40 mg Oral Daily  . pravastatin  80 mg Oral q1800   Continuous: . levofloxacin (LEVAQUIN) IV Stopped (05/05/17 1844)   JPV:[id number], HYDROcodone-acetaminophen, ipratropium-albuterol, ondansetron **OR** ondansetron (ZOFRAN) IV  Assesment: She has sepsis from aspiration pneumonia.  She is on appropriate treatment.  She has continued hypoxia despite having been on very high flow nasal cannula and she is now on BiPAP and things look a little bit better.  At baseline she has COPD.  She is on treatment for that including  steroids inhaled bronchodilators etc.  She has acute kidney injury.  She has heart failure which seems stable  She has coronary disease and is not clear about chest pain considering her situation Principal Problem:   Sepsis (HCC) Active Problems:   CHF (congestive heart failure) (HCC)   COPD (chronic obstructive pulmonary disease) (HCC)   CAD (coronary artery disease)   Essential hypertension   Stroke (HCC)   Aspiration pneumonia (HCC)   AKI (acute kidney injury) (HCC)    Plan: Continue treatments.  Check blood gas and make sure she is better.  Continue antibiotics and steroids.  She is high risk of needing to be intubated and placed on mechanical ventilation and is critically ill    LOS: 3 days   Kami Kube L 05/06/2017, 8:45 AM

## 2017-05-06 NOTE — Progress Notes (Signed)
Patient remains on High Flow Nasal cannula at this time due to patient still feeling nausea. BiPAP is on standby but HFNC provides support for patient at this time. Patient has saturation in the upper 90s and will work to titrate patient from FIO2 currently at 25lpm with 100%.

## 2017-05-06 NOTE — Progress Notes (Signed)
PROGRESS NOTE                                                                                                                                                                                                             Patient Demographics:    Mariah White, is a 67 y.o. female, DOB - 10/07/1950, OZH:[id number]  Admit date - 05/03/2017   Admitting Physician Pratik Hoover Brunette, DO  Outpatient Primary MD for the patient is Kirstie Peri, MD  LOS - 3  Outpatient Specialists: None  Chief Complaint  Patient presents with  . Altered Mental Status       Brief Narrative 67 year old female with history of diastolic CHF, COPD, CAD, prior CVA and hypertension found at home on the floor by her son.  Patient was in her usual state the previous night.  EMS found her to have some facial droop. In the ED patient was septic with  In the ED patient was septic with fever of 101.5 F, hypotensive, hypoxic, tachycardic and elevated leukocytes with elevated lactic acid meeting criteria for sepsis and code sepsis was initiated.  Patient given IV fluid bolus and empiric antibiotics for possible pneumonia and admitted to stepdown unit.   Subjective:   Was on BiPAP all day, improved hypoxemia on ABG this morning.  Could not be weaned off BiPAP and during the afternoon was found to be vomiting on BiPAP and transitioned to high flow nasal cannula.   Assessment  & Plan :    Principal Problem:   Sepsis (HCC) Acute respiratory failure with hypoxemia (HCC) [place] due to aspiration pneumonia.  Blood cultures on admission negative.  On empiric aztreonam and Levaquin.  Subsequent chest x-ray from yesterday shows worsening infiltrate. Seen by SLP and recommends mild to moderate aspiration risk and placed on dysphagia level 2 diet with nectar thick liquid.  Currently n.p.o. due to poor respiratory status. Placed on BiPAP since yesterday and transitioned to high  flow nasal cannula this afternoon due to vomiting.  Placed back on BiPAP overnight. High risk of being intubated.   Flu PCR negative.    Active Problems: Chronic diastolic CHF No signs of volume overload.   Last echo from 08/2016 with EF of 75%.  IV Lasix as needed for fluid overload.      COPD (chronic obstructive pulmonary disease) (HCC) Acute on  chronic respiratory failure with hypoxia (HCC) Likely due to pneumonia and mild COPD exacerbation.  Continue high flow nasal cannula alternating with BiPAP.  Continue IV Solu-Medrol and scheduled nebs. Pulmonary consult appreciated.    Hypotension Secondary to stroke.  Home blood pressure medications held.  Placed on as needed IV hydralazine.  Acute kidney injury Secondary to sepsis.  Renal function improved.   Acute toxic metabolic encephalopathy Possibly due to sepsis .  Has been very confused.  Avoid narcotics or benzos.  CT and MRI brain negative for acute infarct.   Leukocytosis Secondary to sepsis and steroid use.  Improving.   Code Status : Full code  Family Communication  : Discussed with in detail with son at bedside.  Disposition Plan  : Pending hospital course  Barriers For Discharge : Active symptoms  Consults  : Pulmonary  Procedures  : Head CT MRI brain   DVT Prophylaxis  :  Lovenox -  Lab Results  Component Value Date   PLT 215 05/06/2017    Antibiotics  :    Anti-infectives (From admission, onward)   Start     Dose/Rate Route Frequency Ordered Stop   05/06/17 1700  levofloxacin (LEVAQUIN) IVPB 500 mg     500 mg 100 mL/hr over 60 Minutes Intravenous Every 24 hours 05/06/17 1056     05/06/17 1000  aztreonam (AZACTAM) 1 g in dextrose 5 % 50 mL IVPB     1 g 100 mL/hr over 30 Minutes Intravenous Every 8 hours 05/06/17 0946     05/05/17 1600  levofloxacin (LEVAQUIN) IVPB 750 mg  Status:  Discontinued     750 mg 100 mL/hr over 90 Minutes Intravenous Every 48 hours 05/03/17 2123 05/06/17 1056    05/04/17 1700  vancomycin (VANCOCIN) IVPB 1000 mg/200 mL premix  Status:  Discontinued     1,000 mg 200 mL/hr over 60 Minutes Intravenous Every 24 hours 05/03/17 2123 05/05/17 1336   05/04/17 0200  aztreonam (AZACTAM) 1 g in dextrose 5 % 50 mL IVPB  Status:  Discontinued     1 g 100 mL/hr over 30 Minutes Intravenous Every 8 hours 05/03/17 2123 05/06/17 0755   05/03/17 1630  vancomycin (VANCOCIN) 2,000 mg in sodium chloride 0.9 % 500 mL IVPB     2,000 mg 250 mL/hr over 120 Minutes Intravenous  Once 05/03/17 1603 05/03/17 1825   05/03/17 1600  levofloxacin (LEVAQUIN) IVPB 750 mg     750 mg 100 mL/hr over 90 Minutes Intravenous  Once 05/03/17 1554 05/03/17 1846   05/03/17 1600  aztreonam (AZACTAM) 2 g in dextrose 5 % 50 mL IVPB     2 g 100 mL/hr over 30 Minutes Intravenous  Once 05/03/17 1554 05/03/17 1639   05/03/17 1600  vancomycin (VANCOCIN) IVPB 1000 mg/200 mL premix  Status:  Discontinued     1,000 mg 200 mL/hr over 60 Minutes Intravenous  Once 05/03/17 1554 05/03/17 1603        Objective:   Vitals:   05/06/17 1400 05/06/17 1514 05/06/17 1600 05/06/17 1602  BP: (!) 149/90  (!) 133/99   Pulse: (!) 112 (!) 111 (!) 114   Resp: (!) 26 (!) 23 (!) 22   Temp:      TempSrc:      SpO2: (!) 88% 91% 93% 93%  Weight:      Height:        Wt Readings from Last 3 Encounters:  05/06/17 82.1 kg (180 lb 16 oz)  11/23/16 80.7  kg (178 lb)  10/06/16 81.6 kg (180 lb)     Intake/Output Summary (Last 24 hours) at 05/06/2017 1630 Last data filed at 05/06/2017 1621 Gross per 24 hour  Intake 150 ml  Output 3950 ml  Net -3800 ml     Physical Exam General: Elderly female on BiPAP, HEENT: Moist mucosa, supple neck Chest: Diminished breath sounds bilaterally, no further wheezing CVS: S1 and S2 tachycardic, no murmurs rubs or gallop GI: Soft, nondistended, nontender, Foley placed with clear urine Musculoskeletal: Warm, no edema CNS: Awake but confused     Data Review:    CBC Recent  Labs  Lab 05/03/17 1609 05/04/17 0448 05/05/17 0544 05/06/17 0419  WBC 13.4* 20.5* 19.1* 16.9*  HGB 12.8 11.3* 11.6* 11.9*  HCT 42.0 36.9 36.7 37.5  PLT 201 185 202 215  MCV 95.0 96.3 93.1 92.4  MCH 29.0 29.5 29.4 29.3  MCHC 30.5 30.6 31.6 31.7  RDW 14.7 15.1 15.1 15.1  LYMPHSABS 1.3  --   --   --   MONOABS 0.7  --   --   --   EOSABS 0.0  --   --   --   BASOSABS 0.0  --   --   --     Chemistries  Recent Labs  Lab 05/03/17 1609 05/04/17 0448 05/05/17 0544  NA 139 138 140  K 5.8* 5.1 4.5  CL 107 110 110  CO2 22 18* 18*  GLUCOSE 93 146* 136*  BUN 39* 36* 32*  CREATININE 1.96* 1.60* 1.12*  CALCIUM 8.8* 8.0* 9.2  AST 47*  --   --   ALT 28  --   --   ALKPHOS 115  --   --   BILITOT 0.7  --   --    ------------------------------------------------------------------------------------------------------------------ No results for input(s): CHOL, HDL, LDLCALC, TRIG, CHOLHDL, LDLDIRECT in the last 72 hours.  No results found for: HGBA1C ------------------------------------------------------------------------------------------------------------------ No results for input(s): TSH, T4TOTAL, T3FREE, THYROIDAB in the last 72 hours.  Invalid input(s): FREET3 ------------------------------------------------------------------------------------------------------------------ No results for input(s): VITAMINB12, FOLATE, FERRITIN, TIBC, IRON, RETICCTPCT in the last 72 hours.  Coagulation profile No results for input(s): INR, PROTIME in the last 168 hours.  No results for input(s): DDIMER in the last 72 hours.  Cardiac Enzymes Recent Labs  Lab 05/03/17 1609  TROPONINI <0.03   ------------------------------------------------------------------------------------------------------------------    Component Value Date/Time   BNP 202.0 (H) 05/03/2017 1610    Inpatient Medications  Scheduled Meds: . calcium-vitamin D  1 tablet Oral Daily  . chlorhexidine  15 mL Mouth Rinse BID  .  clopidogrel  75 mg Oral Daily  . enoxaparin (LOVENOX) injection  40 mg Subcutaneous Q24H  . ipratropium-albuterol  3 mL Nebulization Q4H  . magnesium oxide  400 mg Oral Daily  . mouth rinse  15 mL Mouth Rinse q12n4p  . methylPREDNISolone (SOLU-MEDROL) injection  60 mg Intravenous Q8H  . mometasone-formoterol  2 puff Inhalation BID  . pantoprazole  40 mg Oral Daily  . pravastatin  80 mg Oral q1800   Continuous Infusions: . aztreonam Stopped (05/06/17 1153)  . levofloxacin (LEVAQUIN) IV Stopped (05/06/17 1721)   PRN Meds:.HYDROcodone-acetaminophen, HYDROcodone-acetaminophen, ipratropium-albuterol, ondansetron **OR** ondansetron (ZOFRAN) IV  Micro Results Recent Results (from the past 240 hour(s))  Urine culture     Status: None   Collection Time: 05/03/17  3:54 PM  Result Value Ref Range Status   Specimen Description   Final    URINE, CLEAN CATCH Performed at [facility], [address], [place], [place] [address]    Special Requests   Final    NONE Performed at [facility], [address], [place], [place] [address]    Culture   Final    NO GROWTH Performed at [facility], [address], [place], [place] [address]    Report Status 05/05/2017 FINAL  Final  Blood Culture (routine x 2)     Status: None (Preliminary result)   Collection Time: 05/03/17  4:09 PM  Result Value Ref Range Status   Specimen Description RIGHT ANTECUBITAL  Final   Special Requests   Final    BOTTLES DRAWN AEROBIC AND ANAEROBIC Blood Culture adequate volume   Culture   Final    NO GROWTH 3 DAYS Performed at [facility], [address] [place], [place] [address]    Report Status PENDING  Incomplete  Blood Culture (routine x 2)     Status: None (Preliminary result)   Collection Time: 05/03/17  4:10 PM  Result Value Ref Range Status   Specimen Description BLOOD LEFT HAND  Final   Special Requests   Final    BOTTLES DRAWN AEROBIC AND ANAEROBIC Blood Culture adequate volume   Culture   Final      NO GROWTH 3 DAYS Performed at [facility], [address] [place], [place] [address]    Report Status PENDING  Incomplete  MRSA PCR Screening     Status: None   Collection Time: 05/03/17 10:15 PM  Result Value Ref Range Status   MRSA by PCR NEGATIVE NEGATIVE Final    Comment:        The GeneXpert MRSA Assay (FDA approved for NASAL specimens only), is one component of a comprehensive MRSA colonization surveillance program. It is not intended to diagnose MRSA infection nor to guide or monitor treatment for MRSA infections.     Radiology Reports Ct Head Wo Contrast  Result Date: 05/03/2017 CLINICAL DATA:  Found unresponsive EXAM: CT HEAD WITHOUT CONTRAST TECHNIQUE: Contiguous axial images were obtained from the base of the skull through the vertex without intravenous contrast. COMPARISON:  02/01/2017 FINDINGS: Brain: No acute intracranial abnormality. Specifically, no hemorrhage, hydrocephalus, mass lesion, acute infarction, or significant intracranial injury. Vascular: No hyperdense vessel or unexpected calcification. Skull: No acute calvarial abnormality. Sinuses/Orbits: Visualized paranasal sinuses and mastoids clear. Orbital soft tissues unremarkable. Other: None IMPRESSION: No acute intracranial abnormality. Electronically Signed   By: Charlett Nose M.D.   On: 05/03/2017 18:52   Mr Brain Wo Contrast  Result Date: 05/04/2017 CLINICAL DATA:  Altered level of consciousness, unexplained. Patient was found unconscious. EXAM: MRI HEAD WITHOUT CONTRAST TECHNIQUE: Multiplanar, multiecho pulse sequences of the brain and surrounding structures were obtained without intravenous contrast. COMPARISON:  Head CT from yesterday FINDINGS: Brain: No acute infarction, hemorrhage, hydrocephalus, extra-axial collection or mass lesion. 4 or 5 small areas of cortically based FLAIR signal abnormality with subtle volume loss along the left cerebral hemisphere, compatible with remote small MCA/watershed  distribution infarcts. Patient has history of stroke. Mild chronic small vessel ischemic type changes in the cerebral white matter. No atrophy or chronic hemorrhagic injury. Vascular: Major flow voids are preserved. Skull and upper cervical spine: No evidence of marrow lesion. Sinuses/Orbits: Bilateral cataract resection. IMPRESSION: 1. No acute finding. 2. Small scattered remote cortical infarcts in the left MCA/watershed distribution. Electronically Signed   By: Marnee Spring M.D.   On: 05/04/2017 13:16   Dg Chest Port 1 View  Result Date: 05/05/2017 CLINICAL DATA:  Hypoxemia.  Sepsis. EXAM: PORTABLE CHEST 1 VIEW COMPARISON:  05/03/2017 FINDINGS: The cardiac silhouette is upper limits of normal in size. Lung volumes are improved compared to the prior study. There is worsening of previously described right lung airspace opacity, now diffusely involving the right lung. New diffuse left lung airspace opacity is also present. No sizable pleural effusion or pneumothorax is identified. IMPRESSION: Marked worsening of airspace disease, now with diffuse bilateral involvement. Electronically Signed   By: Sebastian Ache M.D.   On: 05/05/2017 08:49   Dg Chest Port 1 View  Result Date: 05/03/2017 CLINICAL DATA:  Patient found unresponsive. EXAM: PORTABLE CHEST 1 VIEW COMPARISON:  Aug 07, 2016 FINDINGS: There is focal infiltrate in the right lower lung, likely pneumonia or aspiration. The cardiomediastinal silhouette is normal given technique. No pneumothorax. No other acute abnormalities. IMPRESSION: New infiltrate in the right lower lung, likely pneumonia or aspiration. Recommend follow-up to resolution. Electronically Signed   By: Gerome Sam III M.D   On: 05/03/2017 16:12    Time Spent in minutes  35   Mariah White M.D on 05/06/2017 at 4:30 PM  Between 7am to 7pm - Pager - [phone]  After 7pm go to [email or web address] - password [facility]  [facility] -  Office  [phone]

## 2017-05-06 NOTE — Progress Notes (Signed)
SLP Cancellation Note  Patient Details Name: Mariah White MRN: [id number] DOB: 1950-04-16   Cancelled treatment:       Reason Eval/Treat Not Completed: Patient not medically ready; D/W respiratory and RN; reviewed current chest x-ray results. Pt was requiring Bi-PAP, however coughing and throwing up so changed to HFNC to decrease risk of aspiration on Bi-PAP. Pt currently inappropriate for po and will need re-evaluation tomorrow for po readiness.   Thank you,  Havery Moros, CCC-SLP [phone]    Arnold Depinto 05/06/2017, 3:04 PM

## 2017-05-07 LAB — CBC
HCT: 40.4 % (ref 36.0–46.0)
HEMOGLOBIN: 12.7 g/dL (ref 12.0–15.0)
MCH: 29.1 pg (ref 26.0–34.0)
MCHC: 31.4 g/dL (ref 30.0–36.0)
MCV: 92.4 fL (ref 78.0–100.0)
PLATELETS: 213 10*3/uL (ref 150–400)
RBC: 4.37 MIL/uL (ref 3.87–5.11)
RDW: 15.1 % (ref 11.5–15.5)
WBC: 16.9 10*3/uL — ABNORMAL HIGH (ref 4.0–10.5)

## 2017-05-07 LAB — BASIC METABOLIC PANEL
Anion gap: 15 (ref 5–15)
BUN: 37 mg/dL — ABNORMAL HIGH (ref 6–20)
CALCIUM: 10 mg/dL (ref 8.9–10.3)
CO2: 29 mmol/L (ref 22–32)
CREATININE: 1.11 mg/dL — AB (ref 0.44–1.00)
Chloride: 101 mmol/L (ref 101–111)
GFR, EST AFRICAN AMERICAN: 58 mL/min — AB (ref >60)
GFR, EST NON AFRICAN AMERICAN: 50 mL/min — AB (ref >60)
GLUCOSE: 147 mg/dL — AB (ref 65–99)
Potassium: 3.9 mmol/L (ref 3.5–5.1)
Sodium: 145 mmol/L (ref 135–145)

## 2017-05-07 LAB — BLOOD GAS, ARTERIAL
Acid-Base Excess: 6.1 mmol/L — ABNORMAL HIGH (ref 0.0–2.0)
Acid-base deficit: 6.1 mmol/L — ABNORMAL HIGH (ref 0.0–2.0)
BICARBONATE: 29.6 mmol/L — AB (ref 20.0–28.0)
Drawn by: 270161
FIO2: 100
O2 Content: 25 L/min
O2 SAT: 99.1 %
PCO2 ART: 45.1 mmHg (ref 32.0–48.0)
PO2 ART: 200 mmHg — AB (ref 83.0–108.0)
Patient temperature: 37
pH, Arterial: 7.441 (ref 7.350–7.450)

## 2017-05-07 MED ORDER — HYDRALAZINE HCL 20 MG/ML IJ SOLN: 10.0000 mg | Freq: Four times a day (QID) | INTRAMUSCULAR | Status: DC | PRN

## 2017-05-07 NOTE — Progress Notes (Signed)
I will not be available tomorrow due to a death in the family 

## 2017-05-07 NOTE — Progress Notes (Addendum)
PROGRESS NOTE                                                                                                                                                                                                             Patient Demographics:    Mariah White, is a 67 y.o. female, DOB - [birth year], ZOX:[id number]  Admit date - 05/03/2017   Admitting Physician Pratik Hoover Brunette, DO  Outpatient Primary MD for the patient is Kirstie Peri, MD  LOS - 4  Outpatient Specialists: None  Chief Complaint  Patient presents with  . Altered Mental Status       Brief Narrative 67 year old female with history of diastolic CHF, COPD, CAD, prior CVA and hypertension found at home on the floor by her son.  Patient was in her usual state the previous night.  EMS found her to have some facial droop. In the ED patient was septic with  In the ED patient was septic with fever of 101.5 F, hypotensive, hypoxic, tachycardic and elevated leukocytes with elevated lactic acid meeting criteria for sepsis and code sepsis was initiated.  Patient given IV fluid bolus and empiric antibiotics for possible pneumonia and admitted to stepdown unit.   Subjective:   Has been on high flow nasal cannula since yesterday afternoon after she vomited while on BiPAP.  She is more oriented today.  Assessment  & Plan :    Principal Problem:   Sepsis (HCC) Acute respiratory failure with hypoxemia (HCC) Likely secondary to aspiration pneumonia.  Blood cultures and flu PCR negative.  Continue empiric aztreonam and Levaquin.  Worsened subsequent x-ray from 2/2.  Seen by SLP and placed on dysphagia level 2 diet but patient was made n.p.o. due to her worsened respiratory function and vomiting.  Will ask SLP to reevaluate. -On high flow nasal cannula, transition to BiPAP if no further nausea or vomiting. High risk of being intubated.  Check ABG. - scheduled nebs and IV  Solu-Medrol.  Pulmonary consult and follow-up appreciated.    Active Problems: Chronic diastolic CHF No signs of volume overload.   Last echo from 08/2016 with EF of 75%.  Intermittent IV Lasix.     COPD (chronic obstructive pulmonary disease) (HCC) Acute on chronic respiratory failure with hypoxia (HCC) Likely due to pneumonia and mild COPD exacerbation.  Continue high flow nasal cannula  alternating with BiPAP.  Continue IV Solu-Medrol and scheduled nebs.     Hypotension Secondary to stroke.  Home blood pressure medications on admission.  Now has elevated blood pressure.  Resume home medications once able to take her pills.  As needed IV hydralazine for now.  Acute kidney injury Secondary to sepsis.  Renal function improved to normal..   Acute toxic metabolic encephalopathy Possibly due to sepsis .    Avoid narcotics or benzos.  CT and MRI brain negative for acute infarct.  Mentation much better this morning.   Leukocytosis Secondary to sepsis and steroid use.   Code Status : Full code  Family Communication  : Discussed with son on 2/3  Disposition Plan  : Pending hospital course.  Transition to stepdown monitoring.  Barriers For Discharge : Active symptoms  Consults  : Pulmonary  Procedures  : Head CT MRI brain   DVT Prophylaxis  :  Lovenox -  Lab Results  Component Value Date   PLT 213 05/07/2017    Antibiotics  :    Anti-infectives (From admission, onward)   Start     Dose/Rate Route Frequency Ordered Stop   05/06/17 1700  levofloxacin (LEVAQUIN) IVPB 500 mg     500 mg 100 mL/hr over 60 Minutes Intravenous Every 24 hours 05/06/17 1056     05/06/17 1000  aztreonam (AZACTAM) 1 g in dextrose 5 % 50 mL IVPB     1 g 100 mL/hr over 30 Minutes Intravenous Every 8 hours 05/06/17 0946     05/05/17 1600  levofloxacin (LEVAQUIN) IVPB 750 mg  Status:  Discontinued     750 mg 100 mL/hr over 90 Minutes Intravenous Every 48 hours 05/03/17 2123 05/06/17 1056    05/04/17 1700  vancomycin (VANCOCIN) IVPB 1000 mg/200 mL premix  Status:  Discontinued     1,000 mg 200 mL/hr over 60 Minutes Intravenous Every 24 hours 05/03/17 2123 05/05/17 1336   05/04/17 0200  aztreonam (AZACTAM) 1 g in dextrose 5 % 50 mL IVPB  Status:  Discontinued     1 g 100 mL/hr over 30 Minutes Intravenous Every 8 hours 05/03/17 2123 05/06/17 0755   05/03/17 1630  vancomycin (VANCOCIN) 2,000 mg in sodium chloride 0.9 % 500 mL IVPB     2,000 mg 250 mL/hr over 120 Minutes Intravenous  Once 05/03/17 1603 05/03/17 1825   05/03/17 1600  levofloxacin (LEVAQUIN) IVPB 750 mg     750 mg 100 mL/hr over 90 Minutes Intravenous  Once 05/03/17 1554 05/03/17 1846   05/03/17 1600  aztreonam (AZACTAM) 2 g in dextrose 5 % 50 mL IVPB     2 g 100 mL/hr over 30 Minutes Intravenous  Once 05/03/17 1554 05/03/17 1639   05/03/17 1600  vancomycin (VANCOCIN) IVPB 1000 mg/200 mL premix  Status:  Discontinued     1,000 mg 200 mL/hr over 60 Minutes Intravenous  Once 05/03/17 1554 05/03/17 1603        Objective:   Vitals:   05/07/17 0400 05/07/17 0410 05/07/17 0500 05/07/17 0834  BP: (!) 162/95  (!) 155/101   Pulse: (!) 109  (!) 111   Resp: (!) 23  (!) 23   Temp: 98.5 F (36.9 C)   97.9 F (36.6 C)  TempSrc: Oral   Oral  SpO2: 95% 96% 94% 96%  Weight:   79.7 kg (175 lb 11.3 oz)   Height:        Wt Readings from Last 3 Encounters:  05/07/17 79.7  kg (175 lb 11.3 oz)  11/23/16 80.7 kg (178 lb)  10/06/16 81.6 kg (180 lb)     Intake/Output Summary (Last 24 hours) at 05/07/2017 1048 Last data filed at 05/07/2017 0500 Gross per 24 hour  Intake 200 ml  Output 4175 ml  Net -3975 ml     Physical Exam General: Elderly female on high flow nasal cannula, not in distress, HEENT: Moist mucosa, supple neck Chest: Improved breath sounds bilaterally, no crackles or wheezing CVS: Normal S1 and S2, no murmurs rubs or gallop GI: Soft, nondistended, nontender, Foley + Musculoskeletal: Warm, no  edema CNS: Alert and oriented x2      Data Review:    CBC Recent Labs  Lab 05/03/17 1609 05/04/17 0448 05/05/17 0544 05/06/17 0419 05/07/17 0429  WBC 13.4* 20.5* 19.1* 16.9* 16.9*  HGB 12.8 11.3* 11.6* 11.9* 12.7  HCT 42.0 36.9 36.7 37.5 40.4  PLT 201 185 202 215 213  MCV 95.0 96.3 93.1 92.4 92.4  MCH 29.0 29.5 29.4 29.3 29.1  MCHC 30.5 30.6 31.6 31.7 31.4  RDW 14.7 15.1 15.1 15.1 15.1  LYMPHSABS 1.3  --   --   --   --   MONOABS 0.7  --   --   --   --   EOSABS 0.0  --   --   --   --   BASOSABS 0.0  --   --   --   --     Chemistries  Recent Labs  Lab 05/03/17 1609 05/04/17 0448 05/05/17 0544 05/07/17 0429  NA 139 138 140 145  K 5.8* 5.1 4.5 3.9  CL 107 110 110 101  CO2 22 18* 18* 29  GLUCOSE 93 146* 136* 147*  BUN 39* 36* 32* 37*  CREATININE 1.96* 1.60* 1.12* 1.11*  CALCIUM 8.8* 8.0* 9.2 10.0  AST 47*  --   --   --   ALT 28  --   --   --   ALKPHOS 115  --   --   --   BILITOT 0.7  --   --   --    ------------------------------------------------------------------------------------------------------------------ No results for input(s): CHOL, HDL, LDLCALC, TRIG, CHOLHDL, LDLDIRECT in the last 72 hours.  No results found for: HGBA1C ------------------------------------------------------------------------------------------------------------------ No results for input(s): TSH, T4TOTAL, T3FREE, THYROIDAB in the last 72 hours.  Invalid input(s): FREET3 ------------------------------------------------------------------------------------------------------------------ No results for input(s): VITAMINB12, FOLATE, FERRITIN, TIBC, IRON, RETICCTPCT in the last 72 hours.  Coagulation profile No results for input(s): INR, PROTIME in the last 168 hours.  No results for input(s): DDIMER in the last 72 hours.  Cardiac Enzymes Recent Labs  Lab 05/03/17 1609  TROPONINI <0.03    ------------------------------------------------------------------------------------------------------------------    Component Value Date/Time   BNP 202.0 (H) 05/03/2017 1610    Inpatient Medications  Scheduled Meds: . calcium-vitamin D  1 tablet Oral Daily  . chlorhexidine  15 mL Mouth Rinse BID  . clopidogrel  75 mg Oral Daily  . enoxaparin (LOVENOX) injection  40 mg Subcutaneous Q24H  . ipratropium-albuterol  3 mL Nebulization Q4H  . magnesium oxide  400 mg Oral Daily  . mouth rinse  15 mL Mouth Rinse q12n4p  . methylPREDNISolone (SOLU-MEDROL) injection  60 mg Intravenous Q8H  . mometasone-formoterol  2 puff Inhalation BID  . pantoprazole  40 mg Oral Daily  . pravastatin  80 mg Oral q1800   Continuous Infusions: . aztreonam Stopped (05/07/17 0429)  . levofloxacin (LEVAQUIN) IV Stopped (05/06/17 1721)   PRN Meds:.HYDROcodone-acetaminophen, HYDROcodone-acetaminophen,  ipratropium-albuterol, metoprolol tartrate, ondansetron **OR** ondansetron (ZOFRAN) IV  Micro Results Recent Results (from the past 240 hour(s))  Urine culture     Status: None   Collection Time: 05/03/17  3:54 PM  Result Value Ref Range Status   Specimen Description   Final    URINE, CLEAN CATCH Performed at [facility], [address], [place], [place] [address]    Special Requests   Final    NONE Performed at [facility], [address] [place], [place] [address]    Culture   Final    NO GROWTH Performed at [facility], [address], [place], [place] [address]    Report Status 05/05/2017 FINAL  Final  Blood Culture (routine x 2)     Status: None (Preliminary result)   Collection Time: 05/03/17  4:09 PM  Result Value Ref Range Status   Specimen Description RIGHT ANTECUBITAL  Final   Special Requests   Final    BOTTLES DRAWN AEROBIC AND ANAEROBIC Blood Culture adequate volume   Culture   Final    NO GROWTH 4 DAYS Performed at [facility], [address], [place], [place] [address]     Report Status PENDING  Incomplete  Blood Culture (routine x 2)     Status: None (Preliminary result)   Collection Time: 05/03/17  4:10 PM  Result Value Ref Range Status   Specimen Description BLOOD LEFT HAND  Final   Special Requests   Final    BOTTLES DRAWN AEROBIC AND ANAEROBIC Blood Culture adequate volume   Culture   Final    NO GROWTH 4 DAYS Performed at [facility], [address] [place], [place] [address]    Report Status PENDING  Incomplete  MRSA PCR Screening     Status: None   Collection Time: 05/03/17 10:15 PM  Result Value Ref Range Status   MRSA by PCR NEGATIVE NEGATIVE Final    Comment:        The GeneXpert MRSA Assay (FDA approved for NASAL specimens only), is one component of a comprehensive MRSA colonization surveillance program. It is not intended to diagnose MRSA infection nor to guide or monitor treatment for MRSA infections.     Radiology Reports Ct Head Wo Contrast  Result Date: 05/03/2017 CLINICAL DATA:  Found unresponsive EXAM: CT HEAD WITHOUT CONTRAST TECHNIQUE: Contiguous axial images were obtained from the base of the skull through the vertex without intravenous contrast. COMPARISON:  02/01/2017 FINDINGS: Brain: No acute intracranial abnormality. Specifically, no hemorrhage, hydrocephalus, mass lesion, acute infarction, or significant intracranial injury. Vascular: No hyperdense vessel or unexpected calcification. Skull: No acute calvarial abnormality. Sinuses/Orbits: Visualized paranasal sinuses and mastoids clear. Orbital soft tissues unremarkable. Other: None IMPRESSION: No acute intracranial abnormality. Electronically Signed   By: Charlett Nose M.D.   On: 05/03/2017 18:52   Mr Brain Wo Contrast  Result Date: 05/04/2017 CLINICAL DATA:  Altered level of consciousness, unexplained. Patient was found unconscious. EXAM: MRI HEAD WITHOUT CONTRAST TECHNIQUE: Multiplanar, multiecho pulse sequences of the brain and surrounding structures were obtained  without intravenous contrast. COMPARISON:  Head CT from yesterday FINDINGS: Brain: No acute infarction, hemorrhage, hydrocephalus, extra-axial collection or mass lesion. 4 or 5 small areas of cortically based FLAIR signal abnormality with subtle volume loss along the left cerebral hemisphere, compatible with remote small MCA/watershed distribution infarcts. Patient has history of stroke. Mild chronic small vessel ischemic type changes in the cerebral white matter. No atrophy or chronic hemorrhagic injury. Vascular: Major flow voids  are preserved. Skull and upper cervical spine: No evidence of marrow lesion. Sinuses/Orbits: Bilateral cataract resection. IMPRESSION: 1. No acute finding. 2. Small scattered remote cortical infarcts in the left MCA/watershed distribution. Electronically Signed   By: Marnee Spring M.D.   On: 05/04/2017 13:16   Dg Chest Port 1 View  Result Date: 05/05/2017 CLINICAL DATA:  Hypoxemia.  Sepsis. EXAM: PORTABLE CHEST 1 VIEW COMPARISON:  05/03/2017 FINDINGS: The cardiac silhouette is upper limits of normal in size. Lung volumes are improved compared to the prior study. There is worsening of previously described right lung airspace opacity, now diffusely involving the right lung. New diffuse left lung airspace opacity is also present. No sizable pleural effusion or pneumothorax is identified. IMPRESSION: Marked worsening of airspace disease, now with diffuse bilateral involvement. Electronically Signed   By: Sebastian Ache M.D.   On: 05/05/2017 08:49   Dg Chest Port 1 View  Result Date: 05/03/2017 CLINICAL DATA:  Patient found unresponsive. EXAM: PORTABLE CHEST 1 VIEW COMPARISON:  Aug 07, 2016 FINDINGS: There is focal infiltrate in the right lower lung, likely pneumonia or aspiration. The cardiomediastinal silhouette is normal given technique. No pneumothorax. No other acute abnormalities. IMPRESSION: New infiltrate in the right lower lung, likely pneumonia or aspiration. Recommend  follow-up to resolution. Electronically Signed   By: Gerome Sam III M.D   On: 05/03/2017 16:12    Time Spent in minutes  35   Gladine Plude M.D on 05/07/2017 at 10:48 AM  Between 7am to 7pm - Pager - [phone]  After 7pm go to [email or web address] - password [email or web address]  [facility] -  Office  [phone]

## 2017-05-07 NOTE — Progress Notes (Signed)
Subjective: She says she feels a little better.  She is thirsty but he is of course high risk for aspiration.  She has been held n.p.o. because of her respiratory status and because she is been vomiting yesterday.  She is on high flow nasal cannula rather than BiPAP also because of the vomiting.  Objective: Vital signs in last 24 hours: Temp:  [98.5 F (36.9 C)-99.4 F (37.4 C)] 98.5 F (36.9 C) (02/04 0400) Pulse Rate:  [107-121] 111 (02/04 0500) Resp:  [16-26] 23 (02/04 0500) BP: (133-165)/(90-101) 155/101 (02/04 0500) SpO2:  [88 %-99 %] 94 % (02/04 0500) FiO2 (%):  [70 %-100 %] 100 % (02/04 0410) Weight:  [79.7 kg (175 lb 11.3 oz)] 79.7 kg (175 lb 11.3 oz) (02/04 0500) Weight change: -2.4 kg (-4.7 oz) Last BM Date: 05/05/17  Intake/Output from previous day: 02/03 0701 - 02/04 0700 In: 200 [IV Piggyback:200] Out: 4175 [Urine:4175]  PHYSICAL EXAM General appearance: alert and Confused Resp: rhonchi bilaterally Cardio: regular rate and rhythm, S1, S2 normal, no murmur, click, rub or gallop GI: soft, non-tender; bowel sounds normal; no masses,  no organomegaly Extremities: extremities normal, atraumatic, no cyanosis or edema She is slow to respond to questions.  No focal neurological abnormalities  Lab Results:  Results for orders placed or performed during the hospital encounter of 05/03/17 (from the past 48 hour(s))  Blood gas, arterial     Status: Abnormal   Collection Time: 05/05/17  6:00 PM  Result Value Ref Range   O2 Content 15.0 L/min   Delivery systems HI FLOW NASAL CANNULA    pH, Arterial 7.379 7.350 - 7.450   pCO2 arterial 39.8 32.0 - 48.0 mmHg   pO2, Arterial 47.2 (L) 83.0 - 108.0 mmHg   Bicarbonate 22.9 20.0 - 28.0 mmol/L   Acid-base deficit 1.4 0.0 - 2.0 mmol/L   O2 Saturation 82.0 %   Patient temperature 37.0    Collection site RIGHT RADIAL    Drawn by [id number]    Sample type ARTERIAL DRAW    Allens test (pass/fail) PASS PASS    Comment: Performed at  [facility], [address] [place], [place] [address]  CBC     Status: Abnormal   Collection Time: 05/06/17  4:19 AM  Result Value Ref Range   WBC 16.9 (H) 4.0 - 10.5 K/uL   RBC 4.06 3.87 - 5.11 MIL/uL   Hemoglobin 11.9 (L) 12.0 - 15.0 g/dL   HCT 37.5 36.0 - 46.0 %   MCV 92.4 78.0 - 100.0 fL   MCH 29.3 26.0 - 34.0 pg   MCHC 31.7 30.0 - 36.0 g/dL   RDW 15.1 11.5 - 15.5 %   Platelets 215 150 - 400 K/uL    Comment: Performed at [facility], [address] [place], [place] [address]  Blood gas, arterial     Status: Abnormal   Collection Time: 05/06/17  8:30 AM  Result Value Ref Range   FIO2 0.70    Delivery systems BILEVEL POSITIVE AIRWAY PRESSURE    Inspiratory PAP 16.0    Expiratory PAP 8.0    pH, Arterial 7.343 (L) 7.350 - 7.450   pCO2 arterial 46.9 32.0 - 48.0 mmHg   pO2, Arterial 140 (H) 83.0 - 108.0 mmHg   Bicarbonate 23.9 20.0 - 28.0 mmol/L   Acid-base deficit 0.2 0.0 - 2.0 mmol/L   O2 Saturation 98.3 %   Patient temperature 37.0    Collection site RIGHT RADIAL    Drawn by [phone]  Sample type ARTERIAL    Allens test (pass/fail) PASS PASS    Comment: Performed at [facility], [address] [place], [place] [address]  Basic metabolic panel     Status: Abnormal   Collection Time: 05/07/17  4:29 AM  Result Value Ref Range   Sodium 145 135 - 145 mmol/L   Potassium 3.9 3.5 - 5.1 mmol/L   Chloride 101 101 - 111 mmol/L   CO2 29 22 - 32 mmol/L   Glucose, Bld 147 (H) 65 - 99 mg/dL   BUN 37 (H) 6 - 20 mg/dL   Creatinine, Ser 1.11 (H) 0.44 - 1.00 mg/dL   Calcium 10.0 8.9 - 10.3 mg/dL   GFR calc non Af Amer 50 (L) >60 mL/min   GFR calc Af Amer 58 (L) >60 mL/min    Comment: (NOTE) The eGFR has been calculated using the CKD EPI equation. This calculation has not been validated in all clinical situations. eGFR's persistently <60 mL/min signify possible Chronic Kidney Disease.    Anion gap 15 5 - 15    Comment: Performed at [facility], [address], [place],  [place] [address]  CBC     Status: Abnormal   Collection Time: 05/07/17  4:29 AM  Result Value Ref Range   WBC 16.9 (H) 4.0 - 10.5 K/uL   RBC 4.37 3.87 - 5.11 MIL/uL   Hemoglobin 12.7 12.0 - 15.0 g/dL   HCT 40.4 36.0 - 46.0 %   MCV 92.4 78.0 - 100.0 fL   MCH 29.1 26.0 - 34.0 pg   MCHC 31.4 30.0 - 36.0 g/dL   RDW 15.1 11.5 - 15.5 %   Platelets 213 150 - 400 K/uL    Comment: Performed at [facility], [address] [place], [place] [address]    ABGS Recent Labs    05/06/17 0830  PHART 7.343*  PO2ART 140*  HCO3 23.9   CULTURES Recent Results (from the past 240 hour(s))  Urine culture     Status: None   Collection Time: 05/03/17  3:54 PM  Result Value Ref Range Status   Specimen Description   Final    URINE, CLEAN CATCH Performed at [facility], [address], [place], [place] [address]    Special Requests   Final    NONE Performed at [facility], [address], [place], [place] [address]    Culture   Final    NO GROWTH Performed at [facility], [address], [place], [place] [address]    Report Status 05/05/2017 FINAL  Final  Blood Culture (routine x 2)     Status: None (Preliminary result)   Collection Time: 05/03/17  4:09 PM  Result Value Ref Range Status   Specimen Description RIGHT ANTECUBITAL  Final   Special Requests   Final    BOTTLES DRAWN AEROBIC AND ANAEROBIC Blood Culture adequate volume   Culture   Final    NO GROWTH 4 DAYS Performed at [facility], [address] [place], [place] [address]    Report Status PENDING  Incomplete  Blood Culture (routine x 2)     Status: None (Preliminary result)   Collection Time: 05/03/17  4:10 PM  Result Value Ref Range Status   Specimen Description BLOOD LEFT HAND  Final   Special Requests   Final    BOTTLES DRAWN AEROBIC AND ANAEROBIC Blood Culture adequate volume   Culture   Final    NO GROWTH 4 DAYS Performed at [facility], [address] [place], [place] [address]    Report Status PENDING  Incomplete   MRSA PCR Screening     Status: None   Collection Time: 05/03/17 10:15 PM  Result Value Ref Range Status   MRSA by PCR NEGATIVE NEGATIVE Final    Comment:        The GeneXpert MRSA Assay (FDA approved for NASAL specimens only), is one component of a comprehensive MRSA colonization surveillance program. It is not intended to diagnose MRSA infection nor to guide or monitor treatment for MRSA infections.    Studies/Results: No results found.  Medications:  Prior to Admission:  Medications Prior to Admission  Medication Sig Dispense Refill Last Dose  . albuterol (PROVENTIL HFA;VENTOLIN HFA) 108 (90 BASE) MCG/ACT inhaler Inhale 2 puffs into the lungs 2 (two) times daily.   unknown  . ALPRAZolam (XANAX) 1 MG tablet Take 1 tablet (1 mg total) 2 (two) times daily by mouth. 60 tablet 2 05/02/2017 at Unknown time  . amLODipine (NORVASC) 10 MG tablet Take 10 mg by mouth daily.    05/02/2017 at Unknown time  . ARIPiprazole (ABILIFY) 5 MG tablet Take 1 tablet (5 mg total) at bedtime by mouth. 30 tablet 2 05/02/2017 at Unknown time  . clopidogrel (PLAVIX) 75 MG tablet Take 75 mg by mouth daily.   05/02/2017 at Unknown time  . FLUoxetine (PROZAC) 20 MG capsule Take 1 capsule (20 mg total) daily by mouth. 30 capsule 2 05/02/2017 at Unknown time  . Fluticasone-Salmeterol (ADVAIR) 250-50 MCG/DOSE AEPB Inhale 1 puff into the lungs 2 (two) times daily.   05/02/2017 at Unknown time  . furosemide (LASIX) 40 MG tablet TAKE 1 AND 1/2 TABLETS BY MOUTH DAILY 135 tablet 2 05/02/2017 at Unknown time  . gabapentin (NEURONTIN) 600 MG tablet Take 600 mg 3 (three) times daily by mouth.  3 05/02/2017 at Unknown time  . lisinopril (PRINIVIL,ZESTRIL) 40 MG tablet Take 40 mg by mouth daily.   05/02/2017 at Unknown time  . metoprolol succinate (TOPROL-XL) 50 MG 24 hr tablet Take 25 mg by mouth daily. Take with or immediately following a meal.   05/02/2017 at Unknown time  . oxyCODONE-acetaminophen (PERCOCET/ROXICET) 5-325 MG  tablet Take 1-2 tablets by mouth every 4 (four) hours as needed for severe pain.   05/02/2017 at Unknown time  . pantoprazole (PROTONIX) 40 MG tablet Take 40 mg by mouth daily.   05/02/2017 at Unknown time  . pravastatin (PRAVACHOL) 80 MG tablet Take 80 mg by mouth daily.   05/02/2017 at Unknown time  . prazosin (MINIPRESS) 2 MG capsule Take 1 capsule (2 mg total) at bedtime by mouth. 30 capsule 2 05/02/2017 at Unknown time  . Calcium Carb-Cholecalciferol (CALCIUM 600-D PO) Take 1 tablet by mouth daily.   unknown  . Magnesium Oxide 400 (240 Mg) MG TABS Take 1 tablet (400 mg total) by mouth daily. 30 tablet 6 unknown  . Omega-3 Fatty Acids (FISH OIL) 1000 MG CAPS Take by mouth 2 (two) times daily.   unknown   Scheduled: . calcium-vitamin D  1 tablet Oral Daily  . chlorhexidine  15 mL Mouth Rinse BID  . clopidogrel  75 mg Oral Daily  . enoxaparin (LOVENOX) injection  40 mg Subcutaneous Q24H  . ipratropium-albuterol  3 mL Nebulization Q4H  . magnesium oxide  400 mg Oral Daily  . mouth rinse  15 mL Mouth Rinse q12n4p  . methylPREDNISolone (SOLU-MEDROL) injection  60 mg Intravenous Q8H  . mometasone-formoterol  2  puff Inhalation BID  . pantoprazole  40 mg Oral Daily  . pravastatin  80 mg Oral q1800   Continuous: . aztreonam Stopped (05/07/17 0429)  . levofloxacin (LEVAQUIN) IV Stopped (05/06/17 1721)   HAF:BXUXYBFXOVA-NVBTYOMAYOKHT, HYDROcodone-acetaminophen, ipratropium-albuterol, metoprolol tartrate, ondansetron **OR** ondansetron (ZOFRAN) IV  Assesment: She was admitted with sepsis.  She has been treated with IV fluids and IV antibiotics.  She does not appear to be septic now.  Her sepsis is presumably from aspiration pneumonia and her pneumonia is bilateral.  She is on appropriate antibiotics.  At baseline she has COPD which is pretty severe and she is having acute exacerbation related to her pneumonia  She has acute hypoxic and mildly hypercapnic respiratory failure and has been  requiring BiPAP.  However she is having nausea and has vomited.  She is now on high flow nasal cannula  She has altered mental status from metabolic encephalopathy.  There was concern that she had a stroke but we have not been able to demonstrate that on imaging studies.  Particularly with the vomiting and risk of further aspiration she remains very high risk for needing intubation and mechanical ventilation Principal Problem:   Sepsis ([place]) Active Problems:   CHF (congestive heart failure) (HCC)   COPD (chronic obstructive pulmonary disease) (HCC)   CAD (coronary artery disease)   Essential hypertension   Stroke ([place])   Aspiration pneumonia (HCC)   AKI (acute kidney injury) ([place])    Plan: Continue to watch in ICU.  BiPAP as needed.  For now continue high flow nasal cannula steroids and antibiotics    LOS: 4 days   Bennye Nix L 05/07/2017, 7:51 AM

## 2017-05-07 NOTE — Progress Notes (Signed)
  Speech Language Pathology Treatment: Dysphagia  Patient Details Name: Mariah White MRN: [id number] DOB: 1950-04-06 Today's Date: 05/07/2017 Time: 2336-1224 SLP Time Calculation (min) (ACUTE ONLY): 13 min  Assessment / Plan / Recommendation Clinical Impression  Pt with overt s/s of aspiration with nectar-thickened liquids/thin liquid, puree and solids including delayed cough (could represent silent aspiration given her hx of COPD with recent research supporting this) and multiple swallows with suspected delay in the initiation of the swallow as well. Pt has a history of COPD, prior CVA and c/o dysphagia including globus sensation intermittently during meals; pt is also progressively weak and has been requiring Bipap (when tolerated) and/or HFNC since admission.  All of these prognostic factors place her at a high risk for aspiration, so a MBS is indicated to r/o silent aspiration and determine safest diet for pt at current time.  ST will complete MBS and f/u while in acute setting for dysphagia tx/management.  HPI HPI: 67 y.o. female with medical history, per documentation, significant for diastolic CHF, COPD, CAD, hypertension, and prior CVA who was found down at home at approximately 1545 by her son who lives with her.  He called EMS and stated at that time that he last saw her at her usual baseline the previous night at 2200.  EMS apparently noted that the patient would turn her head to look at the staff and was noted to have some right facial droop and left arm weakness.  She was also noted to have some low O2 saturations, but otherwise felt warm      SLP Plan  Continue with current plan of care;MBS       Recommendations  Diet recommendations: NPO Medication Administration: Via alternative means                Oral Care Recommendations: Oral care QID Follow up Recommendations: Other (comment)(TBD) SLP Visit Diagnosis: Dysphagia, pharyngeal phase (R13.13) Plan: Continue with  current plan of care;MBS                       Tressie Stalker, M.S., CCC-SLP 05/07/2017, 4:03 PM

## 2017-05-08 LAB — CULTURE, BLOOD (ROUTINE X 2)
CULTURE: NO GROWTH
CULTURE: NO GROWTH
SPECIAL REQUESTS: ADEQUATE
Special Requests: ADEQUATE

## 2017-05-08 MED ORDER — IPRATROPIUM-ALBUTEROL 0.5-2.5 (3) MG/3ML IN SOLN
3.0000 mL | Freq: Four times a day (QID) | RESPIRATORY_TRACT | Status: DC
  Administered 2017-05-09 – 2017-05-10 (×6): 3 mL via RESPIRATORY_TRACT
  Filled 2017-05-08 (×8): qty 3

## 2017-05-08 NOTE — Progress Notes (Signed)
Patient continues to do well on HFNC. Patient was having some naseua again this evening and was not placed on BIPAP. She has remained stable on the HFNC and been resting comfortably. Respiratory will continue to monitor the patient. Her setting on the HFNC are 80% with 25lpm with saturations in the upper 90s.

## 2017-05-08 NOTE — Progress Notes (Signed)
PROGRESS NOTE                                                                                                                                                                                                             Patient Demographics:    Mariah White, is a 67 y.o. female, DOB - 01/11/1951, ZOX:[id number]  Admit date - 05/03/2017   Admitting Physician Pratik Hoover Brunette, DO  Outpatient Primary MD for the patient is Kirstie Peri, MD  LOS - 5  Outpatient Specialists: None  Chief Complaint  Patient presents with  . Altered Mental Status       Brief Narrative 67 year old female with history of diastolic CHF, COPD, CAD, prior CVA and hypertension found at home on the floor by her son.  Patient was in her usual state the previous night.  EMS found her to have some facial droop. In the ED patient was septic with  In the ED patient was septic with fever of 101.5 F, hypotensive, hypoxic, tachycardic and elevated leukocytes with elevated lactic acid meeting criteria for sepsis and code sepsis was initiated.  Patient given IV fluid bolus and empiric antibiotics for possible pneumonia and admitted to stepdown unit.   Subjective:   Requiring high flow nasal cannula (decreased to 80%).  Patient refusing BiPAP due to nausea.  Assessment  & Plan :    Principal Problem:   Sepsis (HCC) Acute respiratory failure with hypoxemia (HCC) Likely secondary to aspiration pneumonia.  Blood cultures and flu PCR negative.  Continue empiric aztreonam and Levaquin.  Worsened subsequent x-ray from 2/2.  Seen by SLP and placed on dysphagia level 2 diet but patient was made n.p.o. due to her worsened respiratory function and vomiting.  SLP plan on doing modified barium swallow but with her respiratory function (requiring high flow nasal cannula) this has been on hold. -Transition to BiPAP if nausea better.  ABG shows improvement.  - scheduled nebs and  IV Solu-Medrol.  Pulmonary consult and follow-up appreciated.    Active Problems: Chronic diastolic CHF No signs of volume overload.   Last echo from 08/2016 with EF of 75%.  Intermittent IV Lasix.     COPD (chronic obstructive pulmonary disease) (HCC) Acute on chronic respiratory failure with hypoxia (HCC) Likely due to pneumonia and mild COPD exacerbation.  Continue high flow nasal cannula alternating  with BiPAP.  Continue IV Solu-Medrol and scheduled nebs.     Hypotension Secondary to stroke.  Home blood pressure medications on admission.  Now has elevated blood pressure.  Resume home medications once able to take her pills.  As needed IV hydralazine for now.  Acute kidney injury Secondary to sepsis.  Renal function improved to normal..   Acute toxic metabolic encephalopathy Possibly due to sepsis .    Avoid narcotics or benzos.  CT and MRI brain negative for acute infarct.  Mentation much improved.   Leukocytosis Secondary to sepsis and steroid use.   Code Status : Full code  Family Communication  : Discussed with son on 2/3  Disposition Plan  : Pending hospital course.  Continue stepdown monitoring.  Barriers For Discharge : Active symptoms  Consults  : Pulmonary  Procedures  : Head CT MRI brain   DVT Prophylaxis  :  Lovenox -  Lab Results  Component Value Date   PLT 213 05/07/2017    Antibiotics  :    Anti-infectives (From admission, onward)   Start     Dose/Rate Route Frequency Ordered Stop   05/06/17 1700  levofloxacin (LEVAQUIN) IVPB 500 mg     500 mg 100 mL/hr over 60 Minutes Intravenous Every 24 hours 05/06/17 1056     05/06/17 1000  aztreonam (AZACTAM) 1 g in dextrose 5 % 50 mL IVPB     1 g 100 mL/hr over 30 Minutes Intravenous Every 8 hours 05/06/17 0946     05/05/17 1600  levofloxacin (LEVAQUIN) IVPB 750 mg  Status:  Discontinued     750 mg 100 mL/hr over 90 Minutes Intravenous Every 48 hours 05/03/17 2123 05/06/17 1056   05/04/17 1700   vancomycin (VANCOCIN) IVPB 1000 mg/200 mL premix  Status:  Discontinued     1,000 mg 200 mL/hr over 60 Minutes Intravenous Every 24 hours 05/03/17 2123 05/05/17 1336   05/04/17 0200  aztreonam (AZACTAM) 1 g in dextrose 5 % 50 mL IVPB  Status:  Discontinued     1 g 100 mL/hr over 30 Minutes Intravenous Every 8 hours 05/03/17 2123 05/06/17 0755   05/03/17 1630  vancomycin (VANCOCIN) 2,000 mg in sodium chloride 0.9 % 500 mL IVPB     2,000 mg 250 mL/hr over 120 Minutes Intravenous  Once 05/03/17 1603 05/03/17 1825   05/03/17 1600  levofloxacin (LEVAQUIN) IVPB 750 mg     750 mg 100 mL/hr over 90 Minutes Intravenous  Once 05/03/17 1554 05/03/17 1846   05/03/17 1600  aztreonam (AZACTAM) 2 g in dextrose 5 % 50 mL IVPB     2 g 100 mL/hr over 30 Minutes Intravenous  Once 05/03/17 1554 05/03/17 1639   05/03/17 1600  vancomycin (VANCOCIN) IVPB 1000 mg/200 mL premix  Status:  Discontinued     1,000 mg 200 mL/hr over 60 Minutes Intravenous  Once 05/03/17 1554 05/03/17 1603        Objective:   Vitals:   05/08/17 1100 05/08/17 1200 05/08/17 1227 05/08/17 1300  BP: (!) 146/94 (!) 147/88  (!) 147/95  Pulse: (!) 113 (!) 107  (!) 114  Resp: (!) 21 (!) 25  (!) 22  Temp:  98.4 F (36.9 C)    TempSrc:      SpO2: 94% 92% 95% 94%  Weight:      Height:        Wt Readings from Last 3 Encounters:  05/08/17 78.9 kg (173 lb 15.1 oz)  11/23/16 80.7 kg (  178 lb)  10/06/16 81.6 kg (180 lb)     Intake/Output Summary (Last 24 hours) at 05/08/2017 1401 Last data filed at 05/08/2017 0936 Gross per 24 hour  Intake 350 ml  Output 900 ml  Net -550 ml     Physical Exam General: Elderly female not in distress, on high flow nasal cannula HEENT: Moist mucosa, supple neck Chest: Clear breath sounds bilaterally, no added sounds CVS: Normal S1 and S2, no murmurs GI: Soft, nondistended, nontender, Foley + Musculoskeletal: Warm, no edema CNS: Alert and oriented x2-3      Data Review:    CBC Recent  Labs  Lab 05/03/17 1609 05/04/17 0448 05/05/17 0544 05/06/17 0419 05/07/17 0429  WBC 13.4* 20.5* 19.1* 16.9* 16.9*  HGB 12.8 11.3* 11.6* 11.9* 12.7  HCT 42.0 36.9 36.7 37.5 40.4  PLT 201 185 202 215 213  MCV 95.0 96.3 93.1 92.4 92.4  MCH 29.0 29.5 29.4 29.3 29.1  MCHC 30.5 30.6 31.6 31.7 31.4  RDW 14.7 15.1 15.1 15.1 15.1  LYMPHSABS 1.3  --   --   --   --   MONOABS 0.7  --   --   --   --   EOSABS 0.0  --   --   --   --   BASOSABS 0.0  --   --   --   --     Chemistries  Recent Labs  Lab 05/03/17 1609 05/04/17 0448 05/05/17 0544 05/07/17 0429  NA 139 138 140 145  K 5.8* 5.1 4.5 3.9  CL 107 110 110 101  CO2 22 18* 18* 29  GLUCOSE 93 146* 136* 147*  BUN 39* 36* 32* 37*  CREATININE 1.96* 1.60* 1.12* 1.11*  CALCIUM 8.8* 8.0* 9.2 10.0  AST 47*  --   --   --   ALT 28  --   --   --   ALKPHOS 115  --   --   --   BILITOT 0.7  --   --   --    ------------------------------------------------------------------------------------------------------------------ No results for input(s): CHOL, HDL, LDLCALC, TRIG, CHOLHDL, LDLDIRECT in the last 72 hours.  No results found for: HGBA1C ------------------------------------------------------------------------------------------------------------------ No results for input(s): TSH, T4TOTAL, T3FREE, THYROIDAB in the last 72 hours.  Invalid input(s): FREET3 ------------------------------------------------------------------------------------------------------------------ No results for input(s): VITAMINB12, FOLATE, FERRITIN, TIBC, IRON, RETICCTPCT in the last 72 hours.  Coagulation profile No results for input(s): INR, PROTIME in the last 168 hours.  No results for input(s): DDIMER in the last 72 hours.  Cardiac Enzymes Recent Labs  Lab 05/03/17 1609  TROPONINI <0.03   ------------------------------------------------------------------------------------------------------------------    Component Value Date/Time   BNP 202.0 (H)  05/03/2017 1610    Inpatient Medications  Scheduled Meds: . calcium-vitamin D  1 tablet Oral Daily  . chlorhexidine  15 mL Mouth Rinse BID  . clopidogrel  75 mg Oral Daily  . enoxaparin (LOVENOX) injection  40 mg Subcutaneous Q24H  . ipratropium-albuterol  3 mL Nebulization Q4H  . magnesium oxide  400 mg Oral Daily  . mouth rinse  15 mL Mouth Rinse q12n4p  . methylPREDNISolone (SOLU-MEDROL) injection  60 mg Intravenous Q8H  . mometasone-formoterol  2 puff Inhalation BID  . pantoprazole  40 mg Oral Daily  . pravastatin  80 mg Oral q1800   Continuous Infusions: . aztreonam Stopped (05/08/17 0936)  . levofloxacin (LEVAQUIN) IV Stopped (05/07/17 1854)   PRN Meds:.hydrALAZINE, HYDROcodone-acetaminophen, HYDROcodone-acetaminophen, ipratropium-albuterol, metoprolol tartrate, ondansetron **OR** ondansetron (ZOFRAN) IV  Micro Results Recent  Results (from the past 240 hour(s))  Urine culture     Status: None   Collection Time: 05/03/17  3:54 PM  Result Value Ref Range Status   Specimen Description   Final    URINE, CLEAN CATCH Performed at [facility], [address] [place], [place] [address]    Special Requests   Final    NONE Performed at [facility], [address] [place], [place] [address]    Culture   Final    NO GROWTH Performed at [facility], [address] [place], [place] [address]    Report Status 05/05/2017 FINAL  Final  Blood Culture (routine x 2)     Status: None   Collection Time: 05/03/17  4:09 PM  Result Value Ref Range Status   Specimen Description RIGHT ANTECUBITAL  Final   Special Requests   Final    BOTTLES DRAWN AEROBIC AND ANAEROBIC Blood Culture adequate volume   Culture   Final    NO GROWTH 5 DAYS Performed at [facility], [address], [place], [place] [address]    Report Status 05/08/2017 FINAL  Final  Blood Culture (routine x 2)     Status: None   Collection Time: 05/03/17  4:10 PM  Result Value Ref Range Status   Specimen  Description BLOOD LEFT HAND  Final   Special Requests   Final    BOTTLES DRAWN AEROBIC AND ANAEROBIC Blood Culture adequate volume   Culture   Final    NO GROWTH 5 DAYS Performed at [facility], [address], [place], [place] [address]    Report Status 05/08/2017 FINAL  Final  MRSA PCR Screening     Status: None   Collection Time: 05/03/17 10:15 PM  Result Value Ref Range Status   MRSA by PCR NEGATIVE NEGATIVE Final    Comment:        The GeneXpert MRSA Assay (FDA approved for NASAL specimens only), is one component of a comprehensive MRSA colonization surveillance program. It is not intended to diagnose MRSA infection nor to guide or monitor treatment for MRSA infections.     Radiology Reports Ct Head Wo Contrast  Result Date: 05/03/2017 CLINICAL DATA:  Found unresponsive EXAM: CT HEAD WITHOUT CONTRAST TECHNIQUE: Contiguous axial images were obtained from the base of the skull through the vertex without intravenous contrast. COMPARISON:  02/01/2017 FINDINGS: Brain: No acute intracranial abnormality. Specifically, no hemorrhage, hydrocephalus, mass lesion, acute infarction, or significant intracranial injury. Vascular: No hyperdense vessel or unexpected calcification. Skull: No acute calvarial abnormality. Sinuses/Orbits: Visualized paranasal sinuses and mastoids clear. Orbital soft tissues unremarkable. Other: None IMPRESSION: No acute intracranial abnormality. Electronically Signed   By: Charlett Nose M.D.   On: 05/03/2017 18:52   Mr Brain Wo Contrast  Result Date: 05/04/2017 CLINICAL DATA:  Altered level of consciousness, unexplained. Patient was found unconscious. EXAM: MRI HEAD WITHOUT CONTRAST TECHNIQUE: Multiplanar, multiecho pulse sequences of the brain and surrounding structures were obtained without intravenous contrast. COMPARISON:  Head CT from yesterday FINDINGS: Brain: No acute infarction, hemorrhage, hydrocephalus, extra-axial collection or mass lesion. 4 or 5 small  areas of cortically based FLAIR signal abnormality with subtle volume loss along the left cerebral hemisphere, compatible with remote small MCA/watershed distribution infarcts. Patient has history of stroke. Mild chronic small vessel ischemic type changes in the cerebral white matter. No atrophy or chronic hemorrhagic injury. Vascular: Major flow voids are preserved. Skull and upper cervical spine: No evidence of marrow lesion. Sinuses/Orbits: Bilateral  cataract resection. IMPRESSION: 1. No acute finding. 2. Small scattered remote cortical infarcts in the left MCA/watershed distribution. Electronically Signed   By: Marnee Spring M.D.   On: 05/04/2017 13:16   Dg Chest Port 1 View  Result Date: 05/05/2017 CLINICAL DATA:  Hypoxemia.  Sepsis. EXAM: PORTABLE CHEST 1 VIEW COMPARISON:  05/03/2017 FINDINGS: The cardiac silhouette is upper limits of normal in size. Lung volumes are improved compared to the prior study. There is worsening of previously described right lung airspace opacity, now diffusely involving the right lung. New diffuse left lung airspace opacity is also present. No sizable pleural effusion or pneumothorax is identified. IMPRESSION: Marked worsening of airspace disease, now with diffuse bilateral involvement. Electronically Signed   By: Sebastian Ache M.D.   On: 05/05/2017 08:49   Dg Chest Port 1 View  Result Date: 05/03/2017 CLINICAL DATA:  Patient found unresponsive. EXAM: PORTABLE CHEST 1 VIEW COMPARISON:  Aug 07, 2016 FINDINGS: There is focal infiltrate in the right lower lung, likely pneumonia or aspiration. The cardiomediastinal silhouette is normal given technique. No pneumothorax. No other acute abnormalities. IMPRESSION: New infiltrate in the right lower lung, likely pneumonia or aspiration. Recommend follow-up to resolution. Electronically Signed   By: Gerome Sam III M.D   On: 05/03/2017 16:12    Time Spent in minutes  35   Mette Southgate M.D on 05/08/2017 at 2:01 PM  Between  7am to 7pm - Pager - [phone]  After 7pm go to [email or web address] - password [facility]  [facility] -  Office  [phone]

## 2017-05-08 NOTE — Progress Notes (Signed)
SLP Cancellation Note  Patient Details Name: Mariah White MRN: [id number] DOB: [birth year]   Cancelled treatment:       Reason Eval/Treat Not Completed: Patient not medically ready; SLP had planned to take Pt to radiology to complete MBSS, however Pt continues to require HFNC with extra support that cannot be taken to radiology per RT. Pt is showing signs and symptoms of aspiration and significant respiratory compromise. Recommend NPO with temporary alternative means of nutrition (small bore feeding tube if appropriate) until respiratory status has improved and stabilized.   Thank you,  Havery Moros, CCC-SLP [phone]  Anikin Prosser 05/08/2017, 12:08 PM

## 2017-05-08 NOTE — Care Management Note (Signed)
Case Management Note  Patient Details  Name: Mariah White MRN: [id number] Date of Birth: [birth year]   If discussed at Long Length of Stay Meetings, dates discussed:  05/08/2017  Additional Comments:  Jamicia Haaland, Chrystine Oiler, RN 05/08/2017, 12:31 PM

## 2017-05-09 ENCOUNTER — Ambulatory Visit (HOSPITAL_COMMUNITY): Payer: Self-pay | Admitting: Psychiatry

## 2017-05-09 DIAGNOSIS — J189 Pneumonia, unspecified organism: Secondary | ICD-10-CM

## 2017-05-09 LAB — CBC
HEMATOCRIT: 39.7 % (ref 36.0–46.0)
HEMOGLOBIN: 12.6 g/dL (ref 12.0–15.0)
MCH: 29.6 pg (ref 26.0–34.0)
MCHC: 31.7 g/dL (ref 30.0–36.0)
MCV: 93.4 fL (ref 78.0–100.0)
Platelets: 185 10*3/uL (ref 150–400)
RBC: 4.25 MIL/uL (ref 3.87–5.11)
RDW: 14.8 % (ref 11.5–15.5)
WBC: 17.8 10*3/uL — ABNORMAL HIGH (ref 4.0–10.5)

## 2017-05-09 LAB — BASIC METABOLIC PANEL
Anion gap: 14 (ref 5–15)
BUN: 44 mg/dL — ABNORMAL HIGH (ref 6–20)
CALCIUM: 9.1 mg/dL (ref 8.9–10.3)
CO2: 27 mmol/L (ref 22–32)
CREATININE: 0.99 mg/dL (ref 0.44–1.00)
Chloride: 102 mmol/L (ref 101–111)
GFR calc Af Amer: >60 mL/min (ref >60)
GFR calc non Af Amer: 58 mL/min — ABNORMAL LOW (ref >60)
Glucose, Bld: 128 mg/dL — ABNORMAL HIGH (ref 65–99)
Potassium: 4 mmol/L (ref 3.5–5.1)
SODIUM: 143 mmol/L (ref 135–145)

## 2017-05-09 MED ORDER — BUDESONIDE 0.5 MG/2ML IN SUSP
0.5000 mg | Freq: Two times a day (BID) | RESPIRATORY_TRACT | Status: DC
  Administered 2017-05-09 – 2017-05-17 (×16): 0.5 mg via RESPIRATORY_TRACT
  Filled 2017-05-09 (×17): qty 2

## 2017-05-09 NOTE — Progress Notes (Signed)
Subjective: She was admitted with altered mental status.  She is known to have diastolic heart failure, COPD, coronary disease previous stroke and hypertension.  She was septic on admission and had a temperature of 101.5 she was hypotensive and had elevated lactic acid level.  She has improved as far as sepsis is concerned.  She is confused this morning.  She has aspiration pneumonia.  Objective: Vital signs in last 24 hours: Temp:  [97.7 F (36.5 C)-99 F (37.2 C)] 98.7 F (37.1 C) (02/06 0746) Pulse Rate:  [107-126] 126 (02/05 1700) Resp:  [14-28] 25 (02/06 0746) BP: (126-159)/(87-115) 144/96 (02/06 0500) SpO2:  [89 %-95 %] 94 % (02/06 0748) FiO2 (%):  [80 %] 80 % (02/06 0748) Weight:  [78.2 kg (172 lb 6.4 oz)] 78.2 kg (172 lb 6.4 oz) (02/06 0500) Weight change: -0.7 kg (-8.7 oz) Last BM Date: 05/07/17  Intake/Output from previous day: 02/05 0701 - 02/06 0700 In: 250 [IV Piggyback:250] Out: 350 [Urine:350]  PHYSICAL EXAM General appearance: alert and Confused Resp: rhonchi bilaterally Cardio: regular rate and rhythm, S1, S2 normal, no murmur, click, rub or gallop GI: soft, non-tender; bowel sounds normal; no masses,  no organomegaly Extremities: extremities normal, atraumatic, no cyanosis or edema Skin warm and dry  Lab Results:  Results for orders placed or performed during the hospital encounter of 05/03/17 (from the past 48 hour(s))  Blood gas, arterial     Status: Abnormal   Collection Time: 05/07/17 12:07 PM  Result Value Ref Range   FIO2 100.00    O2 Content 25.0 L/min   Delivery systems HI FLOW NASAL CANNULA    pH, Arterial 7.441 7.350 - 7.450   pCO2 arterial 45.1 32.0 - 48.0 mmHg   pO2, Arterial 200 (H) 83.0 - 108.0 mmHg   Bicarbonate 29.6 (H) 20.0 - 28.0 mmol/L   Acid-Base Excess 6.1 (H) 0.0 - 2.0 mmol/L   Acid-base deficit 6.1 (H) 0.0 - 2.0 mmol/L   O2 Saturation 99.1 %   Patient temperature 37.0    Collection site LEFT RADIAL    Drawn by [id number]    Sample  type ARTERIAL DRAW    Allens test (pass/fail) PASS PASS    Comment: Performed at [facility], [address] [place], [place] [address]  CBC     Status: Abnormal   Collection Time: 05/09/17  5:39 AM  Result Value Ref Range   WBC 17.8 (H) 4.0 - 10.5 K/uL   RBC 4.25 3.87 - 5.11 MIL/uL   Hemoglobin 12.6 12.0 - 15.0 g/dL   HCT 39.7 36.0 - 46.0 %   MCV 93.4 78.0 - 100.0 fL   MCH 29.6 26.0 - 34.0 pg   MCHC 31.7 30.0 - 36.0 g/dL   RDW 14.8 11.5 - 15.5 %   Platelets 185 150 - 400 K/uL    Comment: Performed at [facility], [address] [place], [place] [address]  Basic metabolic panel     Status: Abnormal   Collection Time: 05/09/17  5:39 AM  Result Value Ref Range   Sodium 143 135 - 145 mmol/L   Potassium 4.0 3.5 - 5.1 mmol/L   Chloride 102 101 - 111 mmol/L   CO2 27 22 - 32 mmol/L   Glucose, Bld 128 (H) 65 - 99 mg/dL   BUN 44 (H) 6 - 20 mg/dL   Creatinine, Ser 0.99 0.44 - 1.00 mg/dL   Calcium 9.1 8.9 - 10.3 mg/dL   GFR calc non Af Amer 58 (L) >60  mL/min   GFR calc Af Amer >60 >60 mL/min    Comment: (NOTE) The eGFR has been calculated using the CKD EPI equation. This calculation has not been validated in all clinical situations. eGFR's persistently <60 mL/min signify possible Chronic Kidney Disease.    Anion gap 14 5 - 15    Comment: Performed at [facility], [address] [place], [place] [address]    ABGS Recent Labs    05/07/17 1207  PHART 7.441  PO2ART 200*  HCO3 29.6*   CULTURES Recent Results (from the past 240 hour(s))  Urine culture     Status: None   Collection Time: 05/03/17  3:54 PM  Result Value Ref Range Status   Specimen Description   Final    URINE, CLEAN CATCH Performed at [facility], [address], [place], [place] [address]    Special Requests   Final    NONE Performed at [facility], [address] [place], [place] [address]    Culture   Final    NO GROWTH Performed at [facility], [place] [address], [place], [place] [address]     Report Status 05/05/2017 FINAL  Final  Blood Culture (routine x 2)     Status: None   Collection Time: 05/03/17  4:09 PM  Result Value Ref Range Status   Specimen Description RIGHT ANTECUBITAL  Final   Special Requests   Final    BOTTLES DRAWN AEROBIC AND ANAEROBIC Blood Culture adequate volume   Culture   Final    NO GROWTH 5 DAYS Performed at [facility], [address], [place], [place] [address]    Report Status 05/08/2017 FINAL  Final  Blood Culture (routine x 2)     Status: None   Collection Time: 05/03/17  4:10 PM  Result Value Ref Range Status   Specimen Description BLOOD LEFT HAND  Final   Special Requests   Final    BOTTLES DRAWN AEROBIC AND ANAEROBIC Blood Culture adequate volume   Culture   Final    NO GROWTH 5 DAYS Performed at [facility], [address], [place], [place] [address]    Report Status 05/08/2017 FINAL  Final  MRSA PCR Screening     Status: None   Collection Time: 05/03/17 10:15 PM  Result Value Ref Range Status   MRSA by PCR NEGATIVE NEGATIVE Final    Comment:        The GeneXpert MRSA Assay (FDA approved for NASAL specimens only), is one component of a comprehensive MRSA colonization surveillance program. It is not intended to diagnose MRSA infection nor to guide or monitor treatment for MRSA infections.    Studies/Results: No results found.  Medications:  Prior to Admission:  Medications Prior to Admission  Medication Sig Dispense Refill Last Dose  . albuterol (PROVENTIL HFA;VENTOLIN HFA) 108 (90 BASE) MCG/ACT inhaler Inhale 2 puffs into the lungs 2 (two) times daily.   unknown  . ALPRAZolam (XANAX) 1 MG tablet Take 1 tablet (1 mg total) 2 (two) times daily by mouth. 60 tablet 2 05/02/2017 at Unknown time  . amLODipine (NORVASC) 10 MG tablet Take 10 mg by mouth daily.    05/02/2017 at Unknown time  . ARIPiprazole (ABILIFY) 5 MG tablet Take 1 tablet (5 mg total) at bedtime by mouth. 30 tablet 2 05/02/2017 at Unknown time  . clopidogrel  (PLAVIX) 75 MG tablet Take 75 mg by mouth daily.   05/02/2017 at Unknown time  . FLUoxetine (PROZAC) 20 MG  capsule Take 1 capsule (20 mg total) daily by mouth. 30 capsule 2 05/02/2017 at Unknown time  . Fluticasone-Salmeterol (ADVAIR) 250-50 MCG/DOSE AEPB Inhale 1 puff into the lungs 2 (two) times daily.   05/02/2017 at Unknown time  . furosemide (LASIX) 40 MG tablet TAKE 1 AND 1/2 TABLETS BY MOUTH DAILY 135 tablet 2 05/02/2017 at Unknown time  . gabapentin (NEURONTIN) 600 MG tablet Take 600 mg 3 (three) times daily by mouth.  3 05/02/2017 at Unknown time  . lisinopril (PRINIVIL,ZESTRIL) 40 MG tablet Take 40 mg by mouth daily.   05/02/2017 at Unknown time  . metoprolol succinate (TOPROL-XL) 50 MG 24 hr tablet Take 25 mg by mouth daily. Take with or immediately following a meal.   05/02/2017 at Unknown time  . oxyCODONE-acetaminophen (PERCOCET/ROXICET) 5-325 MG tablet Take 1-2 tablets by mouth every 4 (four) hours as needed for severe pain.   05/02/2017 at Unknown time  . pantoprazole (PROTONIX) 40 MG tablet Take 40 mg by mouth daily.   05/02/2017 at Unknown time  . pravastatin (PRAVACHOL) 80 MG tablet Take 80 mg by mouth daily.   05/02/2017 at Unknown time  . prazosin (MINIPRESS) 2 MG capsule Take 1 capsule (2 mg total) at bedtime by mouth. 30 capsule 2 05/02/2017 at Unknown time  . Calcium Carb-Cholecalciferol (CALCIUM 600-D PO) Take 1 tablet by mouth daily.   unknown  . Magnesium Oxide 400 (240 Mg) MG TABS Take 1 tablet (400 mg total) by mouth daily. 30 tablet 6 unknown  . Omega-3 Fatty Acids (FISH OIL) 1000 MG CAPS Take by mouth 2 (two) times daily.   unknown   Scheduled: . calcium-vitamin D  1 tablet Oral Daily  . chlorhexidine  15 mL Mouth Rinse BID  . clopidogrel  75 mg Oral Daily  . enoxaparin (LOVENOX) injection  40 mg Subcutaneous Q24H  . ipratropium-albuterol  3 mL Nebulization Q6H  . magnesium oxide  400 mg Oral Daily  . mouth rinse  15 mL Mouth Rinse q12n4p  . methylPREDNISolone (SOLU-MEDROL)  injection  60 mg Intravenous Q8H  . mometasone-formoterol  2 puff Inhalation BID  . pantoprazole  40 mg Oral Daily  . pravastatin  80 mg Oral q1800   Continuous: . aztreonam Stopped (05/09/17 0249)  . levofloxacin (LEVAQUIN) IV Stopped (05/08/17 1912)   HAF:BXUXYBFXOVA, HYDROcodone-acetaminophen, HYDROcodone-acetaminophen, ipratropium-albuterol, metoprolol tartrate, ondansetron **OR** ondansetron (ZOFRAN) IV  Assesment: She was admitted with acute hypoxic respiratory failure likely secondary to aspiration pneumonia.  She is on appropriate IV antibiotics.  She had been on BiPAP but now is on high flow nasal cannula.  She has COPD exacerbation and she is on IV Solu-Medrol and scheduled nebulizer treatments.  She has chronic diastolic heart failure but no evidence of volume overload  She has toxic metabolic encephalopathy likely related to her sepsis.  She had CT and MRI that were negative for acute infarction.  She is confused this morning.  She had acute kidney injury which is better Principal Problem:   Sepsis ([place]) Active Problems:   CHF (congestive heart failure) (HCC)   COPD (chronic obstructive pulmonary disease) (HCC)   CAD (coronary artery disease)   Essential hypertension   Stroke (HCC)   Aspiration pneumonia (HCC)   AKI (acute kidney injury) ([place])    Plan: Continue treatments.  She has clinically improved    LOS: 6 days   Athaliah Baumbach L 05/09/2017, 8:02 AM

## 2017-05-09 NOTE — Progress Notes (Signed)
[facility] PROGRESS NOTE  Mariah White:[id number] DOB: 11-22-1950 DOA: 05/03/2017 PCP: Kirstie Peri, MD  Interim summary and HPI 67 year old female with history of diastolic CHF, COPD, CAD, prior CVA and hypertension found at home on the floor by her son.  Patient was in her usual state the previous night.  EMS found her to have some facial droop.  In the ED patient was septic with fever of 101.5 F, hypotensive, hypoxic, tachycardic and elevated leukocytes with elevated lactic acid meeting criteria for sepsis.  Source of infection presumed to be pneumonia.  Assessment/Plan: 1-sepsis secondary to pneumonia with acute respiratory failure with hypoxemia: -With presumption to aspiration pneumonia.  But patient empirically improving with Levaquin antibiotic therapy. -Following  BCID and patient improvement will discontinue aspirin. -Continue supportive care and continue weaning oxygen supplementation as needed -Patient can no longer require the use of BiPAP; but still requiring high flow oxygen supplementation. -Appreciate assistance from pulmonary service.  2-COPD exacerbation -Continue scheduled nebulizer treatment and IV Solu-Medrol -Continue current antibiotics -We will initiate the use of Pulmicort and flutter valve. -continue weaning oxygen supplementation as tolerated   3-chronic diastolic heart failure -No signs of fluid overload -Continue intermittent use of Lasix just for volume control -Last echo in May 2018 with preserved ejection fraction at 75%.  4-acute kidney injury -In the setting of poor perfusion with sepsis presentation. -Continue judicious fluid repletion -follow renal function  -Cr is improved/essentially back to baseline  5-HTN -BP stable to slightly elevations at time  -planning to resume home meds when tolerating PO's -meanwhile continue PRN hydralazine  6-acute toxic metabolic encephalopathy -due to infection  -improving; but still  intermittently confused and with overall poor insight -will continue treatment for infection and continue constant re-orientation   7-leukocytosis -In the setting of sepsis and the use of steroids -Follow WBCs trend.  Code Status: Full Family Communication: No family at bedside. Disposition Plan: Remains in stepdown.  Continue weaning oxygen supplementation as tolerated.  Consultants:  Pulmonary service   Procedures:  See below for X-ray reports   Antibiotics:  Aztreonam 1/31>>2/6  Levaquin 1/31>>  HPI/Subjective: Afebrile, is slowly improving.  Still confused intermittently and with poor insight.  Patient's breathing slightly better and no longer requiring BiPAP, but is still with mild respiratory distress, unable to speak in full sentences and requiring high flow oxygen supplementation.  Objective: Vitals:   05/09/17 0746 05/09/17 0748  BP:    Pulse:    Resp: (!) 25   Temp: 98.7 F (37.1 C)   SpO2: 90% 94%    Intake/Output Summary (Last 24 hours) at 05/09/2017 0935 Last data filed at 05/09/2017 8119 Gross per 24 hour  Intake 250 ml  Output 350 ml  Net -100 ml   Filed Weights   05/07/17 0500 05/08/17 0500 05/09/17 0500  Weight: 79.7 kg (175 lb 11.3 oz) 78.9 kg (173 lb 15.1 oz) 78.2 kg (172 lb 6.4 oz)    Exam:   General: Afebrile, denies chest pain, no nausea, no vomiting.  Unable to speak in full sentences and with mild respiratory distress with minimal exertion.  Patient requiring high flow oxygen supplementation.  Cardiovascular: S1 and S2, no rubs, no gallops  Respiratory: Positive tachypnea, diffuse rhonchi and mild expiratory wheezing, no crackles.  Abdomen: Soft, nontender, nondistended, positive bowel sounds  Musculoskeletal: no edema, no cyanosis   Data Reviewed: Basic Metabolic Panel: Recent Labs  Lab 05/03/17 1609 05/04/17 0448 05/05/17 0544 05/07/17 0429 05/09/17 0539  NA 139 138  140 145 143  K 5.8* 5.1 4.5 3.9 4.0  CL 107 110 110 101  102  CO2 22 18* 18* 29 27  GLUCOSE 93 146* 136* 147* 128*  BUN 39* 36* 32* 37* 44*  CREATININE 1.96* 1.60* 1.12* 1.11* 0.99  CALCIUM 8.8* 8.0* 9.2 10.0 9.1   Liver Function Tests: Recent Labs  Lab 05/03/17 1609  AST 47*  ALT 28  ALKPHOS 115  BILITOT 0.7  PROT 6.9  ALBUMIN 3.2*   CBC: Recent Labs  Lab 05/03/17 1609 05/04/17 0448 05/05/17 0544 05/06/17 0419 05/07/17 0429 05/09/17 0539  WBC 13.4* 20.5* 19.1* 16.9* 16.9* 17.8*  NEUTROABS 11.3  --   --   --   --   --   HGB 12.8 11.3* 11.6* 11.9* 12.7 12.6  HCT 42.0 36.9 36.7 37.5 40.4 39.7  MCV 95.0 96.3 93.1 92.4 92.4 93.4  PLT 201 185 202 215 213 185   Cardiac Enzymes: Recent Labs  Lab 05/03/17 1609  TROPONINI <0.03   BNP (last 3 results) Recent Labs    05/03/17 1610  BNP 202.0*   CBG: No results for input(s): GLUCAP in the last 168 hours.  Recent Results (from the past 240 hour(s))  Urine culture     Status: None   Collection Time: 05/03/17  3:54 PM  Result Value Ref Range Status   Specimen Description   Final    URINE, CLEAN CATCH Performed at [facility], [address], [place], [place] [address]    Special Requests   Final    NONE Performed at [facility], [address], [place], [place] [address]    Culture   Final    NO GROWTH Performed at [facility], [address] [place], [place] [address]    Report Status 05/05/2017 FINAL  Final  Blood Culture (routine x 2)     Status: None   Collection Time: 05/03/17  4:09 PM  Result Value Ref Range Status   Specimen Description RIGHT ANTECUBITAL  Final   Special Requests   Final    BOTTLES DRAWN AEROBIC AND ANAEROBIC Blood Culture adequate volume   Culture   Final    NO GROWTH 5 DAYS Performed at [facility], [address], [place], [place] [address]    Report Status 05/08/2017 FINAL  Final  Blood Culture (routine x 2)     Status: None   Collection Time: 05/03/17  4:10 PM  Result Value Ref Range Status   Specimen Description BLOOD  LEFT HAND  Final   Special Requests   Final    BOTTLES DRAWN AEROBIC AND ANAEROBIC Blood Culture adequate volume   Culture   Final    NO GROWTH 5 DAYS Performed at [facility], [address], [place], [place] [address]    Report Status 05/08/2017 FINAL  Final  MRSA PCR Screening     Status: None   Collection Time: 05/03/17 10:15 PM  Result Value Ref Range Status   MRSA by PCR NEGATIVE NEGATIVE Final    Comment:        The GeneXpert MRSA Assay (FDA approved for NASAL specimens only), is one component of a comprehensive MRSA colonization surveillance program. It is not intended to diagnose MRSA infection nor to guide or monitor treatment for MRSA infections.      Studies: No results found.  Scheduled Meds: . budesonide (PULMICORT) nebulizer solution  0.5 mg Nebulization BID  . calcium-vitamin D  1 tablet Oral Daily  . chlorhexidine  15 mL Mouth Rinse BID  . clopidogrel  75 mg Oral Daily  . enoxaparin (LOVENOX) injection  40 mg Subcutaneous Q24H  . ipratropium-albuterol  3 mL Nebulization Q6H  . magnesium oxide  400 mg Oral Daily  . mouth rinse  15 mL Mouth Rinse q12n4p  . methylPREDNISolone (SOLU-MEDROL) injection  60 mg Intravenous Q8H  . pantoprazole  40 mg Oral Daily  . pravastatin  80 mg Oral q1800   Continuous Infusions: . levofloxacin (LEVAQUIN) IV Stopped (05/08/17 1912)    Principal Problem:   Sepsis (HCC) Active Problems:   CHF (congestive heart failure) (HCC)   COPD (chronic obstructive pulmonary disease) (HCC)   CAD (coronary artery disease)   Essential hypertension   Stroke (HCC)   Aspiration pneumonia (HCC)   AKI (acute kidney injury) (HCC)    Time spent: 35 minutes    Vassie Loll  [facility] Pager [phone]. If 7PM-7AM, please contact night-coverage at [email or web address], password [facility] 05/09/2017, 9:35 AM  LOS: 6 days

## 2017-05-09 NOTE — Progress Notes (Signed)
  Speech Language Pathology Treatment: Dysphagia  Patient Details Name: Mariah White MRN: [id number] DOB: Sep 08, 1950 Today's Date: 05/09/2017 Time: 0930-1000 SLP Time Calculation (min) (ACUTE ONLY): 30 min  Assessment / Plan / Recommendation Clinical Impression  Pt seen for ongoing diagnostic dysphagia intervention at bedside. Pt still on HFNC with support (25L); RT working with Pt to wean supplemental needs and hope to be appropriate to take down to radiology tomorrow for MBSS. Pt assessed with ice chips and teaspoon presentations NTL and Pt tolerated without overt signs or symptoms of aspiration. Pt remained at HIGH risk for aspiration due to high O2 demands and reduced cognition (impulsivity, confusion, and reduced awareness). OK for Pt to have teaspoon presentations of nectar-thick liquids when presented by RN and when Pt is alert and upright; continue oral care; single ice chips would be ok as well. SLP will not place order for NTL, as there are many NTL juices/waters in the refrigerator in ICU. This was discussed with RN. SLP to follow and hope to complete MBSS tomorrow.    HPI HPI: 67 y.o. female with medical history, per documentation, significant for diastolic CHF, COPD, CAD, hypertension, and prior CVA who was found down at home at approximately 1545 by her son who lives with her.  He called EMS and stated at that time that he last saw her at her usual baseline the previous night at 2200.  EMS apparently noted that the patient would turn her head to look at the staff and was noted to have some right facial droop and left arm weakness.  She was also noted to have some low O2 saturations, but otherwise felt warm      SLP Plan  Continue with current plan of care;MBS       Recommendations  Diet recommendations: Nectar-thick liquid(OK for NTL by teaspoon until MBSS can be completed ) Liquids provided via: Teaspoon Medication Administration: Via alternative means Supervision: Full  supervision/cueing for compensatory strategies;Trained caregiver to feed patient Compensations: Minimize environmental distractions;Small sips/bites;Slow rate Postural Changes and/or Swallow Maneuvers: Seated upright 90 degrees;Upright 30-60 min after meal                Oral Care Recommendations: Oral care QID Follow up Recommendations: Skilled Nursing facility Plan: Continue with current plan of care;MBS       Thank you,  Havery Moros, CCC-SLP [phone]                 Mariah White 05/09/2017, 10:25 AM

## 2017-05-10 LAB — GLUCOSE, CAPILLARY
Glucose-Capillary: 118 mg/dL — ABNORMAL HIGH (ref 65–99)
Glucose-Capillary: 132 mg/dL — ABNORMAL HIGH (ref 65–99)

## 2017-05-10 MED ORDER — METHYLPREDNISOLONE SODIUM SUCC 125 MG IJ SOLR
60.0000 mg | Freq: Two times a day (BID) | INTRAMUSCULAR | Status: DC
  Administered 2017-05-10 – 2017-05-12 (×4): 60 mg via INTRAVENOUS
  Filled 2017-05-10 (×4): qty 2

## 2017-05-10 MED ORDER — INSULIN ASPART 100 UNIT/ML ~~LOC~~ SOLN: 0.0000 [IU] | Freq: Four times a day (QID) | SUBCUTANEOUS | Status: DC

## 2017-05-10 MED ORDER — LORAZEPAM 2 MG/ML IJ SOLN
0.5000 mg | Freq: Three times a day (TID) | INTRAMUSCULAR | Status: DC | PRN
  Administered 2017-05-10 – 2017-05-11 (×2): 0.5 mg via INTRAVENOUS
  Filled 2017-05-10 (×2): qty 1

## 2017-05-10 MED ORDER — FAT EMULSION 20 % IV EMUL
250.0000 mL | INTRAVENOUS | Status: AC
  Administered 2017-05-10: 250 mL via INTRAVENOUS
  Filled 2017-05-10: qty 250

## 2017-05-10 MED ORDER — INSULIN ASPART 100 UNIT/ML ~~LOC~~ SOLN: 0.0000 [IU] | SUBCUTANEOUS | Status: AC

## 2017-05-10 MED ORDER — LEVALBUTEROL HCL 0.63 MG/3ML IN NEBU
0.6300 mg | INHALATION_SOLUTION | Freq: Four times a day (QID) | RESPIRATORY_TRACT | Status: DC
  Administered 2017-05-11 – 2017-05-13 (×7): 0.63 mg via RESPIRATORY_TRACT
  Filled 2017-05-10 (×7): qty 3

## 2017-05-10 MED ORDER — IPRATROPIUM BROMIDE 0.02 % IN SOLN
0.5000 mg | RESPIRATORY_TRACT | Status: DC
  Administered 2017-05-11 – 2017-05-12 (×5): 0.5 mg via RESPIRATORY_TRACT
  Filled 2017-05-10 (×5): qty 2.5

## 2017-05-10 MED ORDER — HALOPERIDOL LACTATE 5 MG/ML IJ SOLN
2.5000 mg | Freq: Once | INTRAMUSCULAR | Status: AC
  Administered 2017-05-10: 2.5 mg via INTRAVENOUS
  Filled 2017-05-10: qty 1

## 2017-05-10 MED ORDER — LORAZEPAM 2 MG/ML IJ SOLN: 2.0000 mg | Freq: Once | INTRAMUSCULAR | Status: AC

## 2017-05-10 MED ORDER — LORAZEPAM 2 MG/ML IJ SOLN
INTRAMUSCULAR | Status: AC
  Administered 2017-05-10: 2 mg
  Filled 2017-05-10: qty 1

## 2017-05-10 MED ORDER — TRACE MINERALS CR-CU-MN-SE-ZN 10-1000-500-60 MCG/ML IV SOLN
INTRAVENOUS | Status: AC
  Administered 2017-05-10: 18:00:00 via INTRAVENOUS
  Filled 2017-05-10: qty 1000

## 2017-05-10 NOTE — Progress Notes (Signed)
Subjective: She seems better.  She is more alert than previously.  She is not is confused this morning.  Objective: Vital signs in last 24 hours: Temp:  [97.6 F (36.4 C)-98.6 F (37 C)] 98.2 F (36.8 C) (02/07 0750) Resp:  [14-39] 19 (02/07 0750) BP: (89-158)/(58-99) 145/83 (02/07 0600) SpO2:  [68 %-100 %] 91 % (02/07 0600) FiO2 (%):  [50 %-70 %] 55 % (02/07 0416) Weight:  [77.6 kg (171 lb 1.2 oz)] 77.6 kg (171 lb 1.2 oz) (02/07 0500) Weight change: -0.6 kg (-5.2 oz) Last BM Date: 05/07/17  Intake/Output from previous day: 02/06 0701 - 02/07 0700 In: 100 [IV Piggyback:100] Out: -   PHYSICAL EXAM General appearance: alert and Less confused Resp: rhonchi bilaterally Cardio: regular rate and rhythm, S1, S2 normal, no murmur, click, rub or gallop GI: soft, non-tender; bowel sounds normal; no masses,  no organomegaly Extremities: extremities normal, atraumatic, no cyanosis or edema Skin warm and dry  Lab Results:  Results for orders placed or performed during the hospital encounter of 05/03/17 (from the past 48 hour(s))  CBC     Status: Abnormal   Collection Time: 05/09/17  5:39 AM  Result Value Ref Range   WBC 17.8 (H) 4.0 - 10.5 K/uL   RBC 4.25 3.87 - 5.11 MIL/uL   Hemoglobin 12.6 12.0 - 15.0 g/dL   HCT 39.7 36.0 - 46.0 %   MCV 93.4 78.0 - 100.0 fL   MCH 29.6 26.0 - 34.0 pg   MCHC 31.7 30.0 - 36.0 g/dL   RDW 14.8 11.5 - 15.5 %   Platelets 185 150 - 400 K/uL    Comment: Performed at [facility], [address] [place], [place] [address]  Basic metabolic panel     Status: Abnormal   Collection Time: 05/09/17  5:39 AM  Result Value Ref Range   Sodium 143 135 - 145 mmol/L   Potassium 4.0 3.5 - 5.1 mmol/L   Chloride 102 101 - 111 mmol/L   CO2 27 22 - 32 mmol/L   Glucose, Bld 128 (H) 65 - 99 mg/dL   BUN 44 (H) 6 - 20 mg/dL   Creatinine, Ser 0.99 0.44 - 1.00 mg/dL   Calcium 9.1 8.9 - 10.3 mg/dL   GFR calc non Af Amer 58 (L) >60 mL/min   GFR calc Af Amer >60 >60  mL/min    Comment: (NOTE) The eGFR has been calculated using the CKD EPI equation. This calculation has not been validated in all clinical situations. eGFR's persistently <60 mL/min signify possible Chronic Kidney Disease.    Anion gap 14 5 - 15    Comment: Performed at [facility], [address], [place], [place] [address]    ABGS Recent Labs    05/07/17 1207  PHART 7.441  PO2ART 200*  HCO3 29.6*   CULTURES Recent Results (from the past 240 hour(s))  Urine culture     Status: None   Collection Time: 05/03/17  3:54 PM  Result Value Ref Range Status   Specimen Description   Final    URINE, CLEAN CATCH Performed at [facility], [address], [place], [place] [address]    Special Requests   Final    NONE Performed at [facility], [address], [place], [place] [address]    Culture   Final    NO GROWTH Performed at [facility], [place] [address] [place], [place] [address]    Report Status 05/05/2017 FINAL  Final  Blood  Culture (routine x 2)     Status: None   Collection Time: 05/03/17  4:09 PM  Result Value Ref Range Status   Specimen Description RIGHT ANTECUBITAL  Final   Special Requests   Final    BOTTLES DRAWN AEROBIC AND ANAEROBIC Blood Culture adequate volume   Culture   Final    NO GROWTH 5 DAYS Performed at [facility], [address], [place], [place] [address]    Report Status 05/08/2017 FINAL  Final  Blood Culture (routine x 2)     Status: None   Collection Time: 05/03/17  4:10 PM  Result Value Ref Range Status   Specimen Description BLOOD LEFT HAND  Final   Special Requests   Final    BOTTLES DRAWN AEROBIC AND ANAEROBIC Blood Culture adequate volume   Culture   Final    NO GROWTH 5 DAYS Performed at [facility], [address], [place], [place] [address]    Report Status 05/08/2017 FINAL  Final  MRSA PCR Screening     Status: None   Collection Time: 05/03/17 10:15 PM  Result Value Ref Range Status   MRSA by PCR NEGATIVE NEGATIVE Final     Comment:        The GeneXpert MRSA Assay (FDA approved for NASAL specimens only), is one component of a comprehensive MRSA colonization surveillance program. It is not intended to diagnose MRSA infection nor to guide or monitor treatment for MRSA infections.    Studies/Results: No results found.  Medications:  Prior to Admission:  Medications Prior to Admission  Medication Sig Dispense Refill Last Dose  . albuterol (PROVENTIL HFA;VENTOLIN HFA) 108 (90 BASE) MCG/ACT inhaler Inhale 2 puffs into the lungs 2 (two) times daily.   unknown  . ALPRAZolam (XANAX) 1 MG tablet Take 1 tablet (1 mg total) 2 (two) times daily by mouth. 60 tablet 2 05/02/2017 at Unknown time  . amLODipine (NORVASC) 10 MG tablet Take 10 mg by mouth daily.    05/02/2017 at Unknown time  . ARIPiprazole (ABILIFY) 5 MG tablet Take 1 tablet (5 mg total) at bedtime by mouth. 30 tablet 2 05/02/2017 at Unknown time  . clopidogrel (PLAVIX) 75 MG tablet Take 75 mg by mouth daily.   05/02/2017 at Unknown time  . FLUoxetine (PROZAC) 20 MG capsule Take 1 capsule (20 mg total) daily by mouth. 30 capsule 2 05/02/2017 at Unknown time  . Fluticasone-Salmeterol (ADVAIR) 250-50 MCG/DOSE AEPB Inhale 1 puff into the lungs 2 (two) times daily.   05/02/2017 at Unknown time  . furosemide (LASIX) 40 MG tablet TAKE 1 AND 1/2 TABLETS BY MOUTH DAILY 135 tablet 2 05/02/2017 at Unknown time  . gabapentin (NEURONTIN) 600 MG tablet Take 600 mg 3 (three) times daily by mouth.  3 05/02/2017 at Unknown time  . lisinopril (PRINIVIL,ZESTRIL) 40 MG tablet Take 40 mg by mouth daily.   05/02/2017 at Unknown time  . metoprolol succinate (TOPROL-XL) 50 MG 24 hr tablet Take 25 mg by mouth daily. Take with or immediately following a meal.   05/02/2017 at Unknown time  . oxyCODONE-acetaminophen (PERCOCET/ROXICET) 5-325 MG tablet Take 1-2 tablets by mouth every 4 (four) hours as needed for severe pain.   05/02/2017 at Unknown time  . pantoprazole (PROTONIX) 40 MG tablet  Take 40 mg by mouth daily.   05/02/2017 at Unknown time  . pravastatin (PRAVACHOL) 80 MG tablet Take 80 mg by mouth daily.   05/02/2017 at Unknown time  . prazosin (MINIPRESS) 2 MG capsule  Take 1 capsule (2 mg total) at bedtime by mouth. 30 capsule 2 05/02/2017 at Unknown time  . Calcium Carb-Cholecalciferol (CALCIUM 600-D PO) Take 1 tablet by mouth daily.   unknown  . Magnesium Oxide 400 (240 Mg) MG TABS Take 1 tablet (400 mg total) by mouth daily. 30 tablet 6 unknown  . Omega-3 Fatty Acids (FISH OIL) 1000 MG CAPS Take by mouth 2 (two) times daily.   unknown   Scheduled: . budesonide (PULMICORT) nebulizer solution  0.5 mg Nebulization BID  . calcium-vitamin D  1 tablet Oral Daily  . chlorhexidine  15 mL Mouth Rinse BID  . clopidogrel  75 mg Oral Daily  . enoxaparin (LOVENOX) injection  40 mg Subcutaneous Q24H  . ipratropium-albuterol  3 mL Nebulization Q6H  . magnesium oxide  400 mg Oral Daily  . mouth rinse  15 mL Mouth Rinse q12n4p  . methylPREDNISolone (SOLU-MEDROL) injection  60 mg Intravenous Q8H  . pantoprazole  40 mg Oral Daily  . pravastatin  80 mg Oral q1800   Continuous: . levofloxacin (LEVAQUIN) IV Stopped (05/09/17 1910)   UQJ:[id number], HYDROcodone-acetaminophen, HYDROcodone-acetaminophen, ipratropium-albuterol, metoprolol tartrate, ondansetron **OR** ondansetron (ZOFRAN) IV  Assesment: She was admitted with sepsis with presumed aspiration pneumonia.  She was found on the floor at home by her son and there was concern initially that she might have had a stroke but we cannot demonstrate that on imaging studies.  Sepsis seems to be resolving.  She is doing better with her breathing.  She has COPD exacerbation and she is on nebulizer treatments and steroids.  She has acute hypoxic respiratory failure and she is on high flow nasal cannula.  She did require BiPAP earlier  She has had nausea and vomiting but that is better  She had acute toxic metabolic encephalopathy which is  improving but she is still not back to baseline  She had acute kidney injury which is much improved Principal Problem:   Sepsis ([place]) Active Problems:   CHF (congestive heart failure) (HCC)   COPD (chronic obstructive pulmonary disease) (HCC)   CAD (coronary artery disease)   Essential hypertension   Stroke (HCC)   Aspiration pneumonia (HCC)   AKI (acute kidney injury) ([place])    Plan: Continue treatments.  She is high risk for aspiration and she is going to be set up for modified barium swallow.    LOS: 7 days   Aniya Jolicoeur L 05/10/2017, 7:56 AM

## 2017-05-10 NOTE — Progress Notes (Signed)
[facility] PROGRESS NOTE  Mariah White:[id number] DOB: 1950/08/21 DOA: 05/03/2017 PCP: Kirstie Peri, MD  Interim summary and HPI 67 year old female with history of diastolic CHF, COPD, CAD, prior CVA and hypertension found at home on the floor by her son.  Patient was in her usual state the previous night.  EMS found her to have some facial droop.  In the ED patient was septic with fever of 101.5 F, hypotensive, hypoxic, tachycardic and elevated leukocytes with elevated lactic acid meeting criteria for sepsis.  Source of infection presumed to be pneumonia.  Assessment/Plan: 1-sepsis secondary to pneumonia with acute respiratory failure with hypoxemia: -With presumption for aspiration pneumonia.  But patient empirically improving with Levaquin antibiotic therapy. -Following  BCID and patient improvement, Aztreonam was discontinued on 2/6. -Continue supportive care and continue weaning oxygen supplementation as needed -Patient has not longer required BIPAP; but still using high flow oxygen supplementation. -will follow rec's from pulmonary service  2-COPD exacerbation -still with some wheezing -off BIPAP -continue nebulizer and steroids therapy -wean oxygen as tolerated -continue current antibiotics. -continue flutter valve  3-chronic diastolic heart failure -No signs of fluid overload and currently stable/compensated -Continue intermittent use of Lasix just for volume control -Last echo in May 2018 with preserved ejection fraction at 75%.  4-acute kidney injury -In the setting of poor perfusion with sepsis presentation. -continue judicious IVF's -no signs of fluid overload or crackles on exam -follow renal function trend  -Cr is improved/essentially back to baseline  5-HTN -Continue as needed hydralazine -Patient has remained nothing by mouth due to high risk aspiration. -once taking PO's will resume home antihypertensive regimen   6-acute toxic metabolic  encephalopathy -due to infection and hospital-acquired delirium. -Continue consult reorientation and treatment for infection -Will also add low-dose benzodiazepines for agitation and anxiety  7-leukocytosis -No fever -Most likely associated with use of the steroids at this moment -We will follow WBC trend. -Continue current antibiotics.  Code Status: Full Family Communication: No family at bedside. Disposition Plan: Remains in stepdown.  Continue weaning oxygen supplementation as tolerated.  Consultants:  Pulmonary service   Procedures:  See below for X-ray reports   Antibiotics:  Aztreonam 1/31>>2/6  Levaquin 1/31>>  HPI/Subjective: No fever, oriented x2 (intermittently).  Breathing stable in comparison to 05/09/17 exam.  Still requiring high flow oxygen supplementation.  Denies chest pain.  Objective: Vitals:   05/10/17 1200 05/10/17 1300  BP: 126/90   Pulse:    Resp: (!) 37   Temp:    SpO2: 93% 96%    Intake/Output Summary (Last 24 hours) at 05/10/2017 1523 Last data filed at 05/10/2017 0100 Gross per 24 hour  Intake 100 ml  Output -  Net 100 ml   Filed Weights   05/08/17 0500 05/09/17 0500 05/10/17 0500  Weight: 78.9 kg (173 lb 15.1 oz) 78.2 kg (172 lb 6.4 oz) 77.6 kg (171 lb 1.2 oz)    Exam:   General: No fever, patient denies chest pain, no nausea, no vomiting.  Feeling that her breathing is better and is oriented x2 currently.  Per nursing staff with intermittent episode of crying spells, anxiety and agitation.  Still requiring the equivalent of 25 L of oxygen (using high flow oxygen supplementation).   Cardiovascular: S1 and S2, no rubs, no gallops, no JVD.   Respiratory: Tachypneic, diffuse rhonchi, mild expiratory wheezing, no crackles, no using accessory muscles.  Abdomen: Soft, nontender, nondistended, positive bowel sounds.  Musculoskeletal: No edema, no cyanosis.  Data Reviewed: Basic  Metabolic Panel: Recent Labs  Lab 05/03/17 1609  05/04/17 0448 05/05/17 0544 05/07/17 0429 05/09/17 0539  NA 139 138 140 145 143  K 5.8* 5.1 4.5 3.9 4.0  CL 107 110 110 101 102  CO2 22 18* 18* 29 27  GLUCOSE 93 146* 136* 147* 128*  BUN 39* 36* 32* 37* 44*  CREATININE 1.96* 1.60* 1.12* 1.11* 0.99  CALCIUM 8.8* 8.0* 9.2 10.0 9.1   Liver Function Tests: Recent Labs  Lab 05/03/17 1609  AST 47*  ALT 28  ALKPHOS 115  BILITOT 0.7  PROT 6.9  ALBUMIN 3.2*   CBC: Recent Labs  Lab 05/03/17 1609 05/04/17 0448 05/05/17 0544 05/06/17 0419 05/07/17 0429 05/09/17 0539  WBC 13.4* 20.5* 19.1* 16.9* 16.9* 17.8*  NEUTROABS 11.3  --   --   --   --   --   HGB 12.8 11.3* 11.6* 11.9* 12.7 12.6  HCT 42.0 36.9 36.7 37.5 40.4 39.7  MCV 95.0 96.3 93.1 92.4 92.4 93.4  PLT 201 185 202 215 213 185   Cardiac Enzymes: Recent Labs  Lab 05/03/17 1609  TROPONINI <0.03   BNP (last 3 results) Recent Labs    05/03/17 1610  BNP 202.0*    Recent Results (from the past 240 hour(s))  Urine culture     Status: None   Collection Time: 05/03/17  3:54 PM  Result Value Ref Range Status   Specimen Description   Final    URINE, CLEAN CATCH Performed at [facility], [address] [place], [place] [address]    Special Requests   Final    NONE Performed at [facility], [address] [place], [place] [address]    Culture   Final    NO GROWTH Performed at [facility], [address] [place], [place] [address]    Report Status 05/05/2017 FINAL  Final  Blood Culture (routine x 2)     Status: None   Collection Time: 05/03/17  4:09 PM  Result Value Ref Range Status   Specimen Description RIGHT ANTECUBITAL  Final   Special Requests   Final    BOTTLES DRAWN AEROBIC AND ANAEROBIC Blood Culture adequate volume   Culture   Final    NO GROWTH 5 DAYS Performed at [facility], [address], [place], [place] [address]    Report Status 05/08/2017 FINAL  Final  Blood Culture (routine x 2)     Status: None   Collection Time: 05/03/17   4:10 PM  Result Value Ref Range Status   Specimen Description BLOOD LEFT HAND  Final   Special Requests   Final    BOTTLES DRAWN AEROBIC AND ANAEROBIC Blood Culture adequate volume   Culture   Final    NO GROWTH 5 DAYS Performed at [facility], [address], [place], [place] [address]    Report Status 05/08/2017 FINAL  Final  MRSA PCR Screening     Status: None   Collection Time: 05/03/17 10:15 PM  Result Value Ref Range Status   MRSA by PCR NEGATIVE NEGATIVE Final    Comment:        The GeneXpert MRSA Assay (FDA approved for NASAL specimens only), is one component of a comprehensive MRSA colonization surveillance program. It is not intended to diagnose MRSA infection nor to guide or monitor treatment for MRSA infections.      Studies: No results found.  Scheduled Meds: . budesonide (PULMICORT) nebulizer solution  0.5 mg Nebulization BID  . calcium-vitamin  D  1 tablet Oral Daily  . chlorhexidine  15 mL Mouth Rinse BID  . clopidogrel  75 mg Oral Daily  . enoxaparin (LOVENOX) injection  40 mg Subcutaneous Q24H  . ipratropium-albuterol  3 mL Nebulization Q6H  . magnesium oxide  400 mg Oral Daily  . mouth rinse  15 mL Mouth Rinse q12n4p  . methylPREDNISolone (SOLU-MEDROL) injection  60 mg Intravenous Q8H  . pantoprazole  40 mg Oral Daily  . pravastatin  80 mg Oral q1800   Continuous Infusions: . levofloxacin (LEVAQUIN) IV Stopped (05/09/17 1910)    Principal Problem:   Sepsis (HCC) Active Problems:   CHF (congestive heart failure) (HCC)   COPD (chronic obstructive pulmonary disease) (HCC)   CAD (coronary artery disease)   Essential hypertension   Stroke (HCC)   Aspiration pneumonia (HCC)   AKI (acute kidney injury) (HCC)    Time spent: 35 minutes    Vassie Loll  [facility] Pager [phone]. If 7PM-7AM, please contact night-coverage at [email or web address], password [facility] 05/10/2017, 3:23 PM  LOS: 7 days

## 2017-05-10 NOTE — Progress Notes (Signed)
SLP Cancellation Note  Patient Details Name: Mariah White MRN: [id number] DOB: [birth year]   Cancelled treatment:       Reason Eval/Treat Not Completed: Patient not medically ready; Unable to take Pt down to radiology today for MBSS due to respiratory status. SLP will check back tomorrow for appropriateness.  Thank you,  Havery Moros, CCC-SLP [phone]   [name] 05/10/2017, 7:55 PM

## 2017-05-10 NOTE — Progress Notes (Signed)
Pt confused,agitated and crying causing spo2 to decrease to low 80's nurse called to increase o2 to help bring o2 spo2 up.... Increased to 70% and 35lpm spo2 90% hr 147 call into MD

## 2017-05-10 NOTE — Progress Notes (Signed)
PHARMACY - ADULT TOTAL PARENTERAL NUTRITION CONSULT NOTE   Pharmacy Consult for TPN Indication: no substantial nutrition in last week or so  Patient Measurements: Height: 5\' 2"  (157.5 cm) Weight: 171 lb 1.2 oz (77.6 kg) IBW/kg (Calculated) : 50.1   Body mass index is 31.29 kg/m.  Assessment: 67 year old female with history of diastolic CHF, COPD, CAD, prior CVA and hypertension found at home on the floor by her son. Patient was in her usual state the previous night. EMS found her to have some facial droop.  In the ED patient was septic with fever of 101.5 F, hypotensive, hypoxic, tachycardic and elevated leukocytes with elevated lactic acid meeting criteria for sepsis.  Source of infection presumed to be pneumonia.  Insulin requirements in the past 24 hours: none, pt not on any meds for diabetes or glucose control PTA Lytes: OK Renal: OK TPN Access:  PICC line inserted 2/7 TPN start date: 05/10/17 Nutritional Goals (per RD recommendations) - f/u RD note kCal: Protein:  Fluid:  Goal TPN rate: TBD  Current Nutrition: no substantial nutrition in last 5 days or so  Plan:  Clinimix E 5/15 at 82mL/hr. Monitor labs, electrolytes, sugars, fluid status and glucose tolerance Add MVI, trace elements to TPN Check CBG's q6h with SSI and adjust as needed Monitor TPN labs F/U dietician recommendations  Valrie Hart A 05/10/2017,4:10 PM

## 2017-05-11 DIAGNOSIS — E440 Moderate protein-calorie malnutrition: Secondary | ICD-10-CM

## 2017-05-11 DIAGNOSIS — R410 Disorientation, unspecified: Secondary | ICD-10-CM

## 2017-05-11 DIAGNOSIS — R0902 Hypoxemia: Secondary | ICD-10-CM

## 2017-05-11 LAB — GLUCOSE, CAPILLARY
GLUCOSE-CAPILLARY: 223 mg/dL — AB (ref 65–99)
GLUCOSE-CAPILLARY: 160 mg/dL — AB (ref 65–99)
Glucose-Capillary: 129 mg/dL — ABNORMAL HIGH (ref 65–99)
Glucose-Capillary: 129 mg/dL — ABNORMAL HIGH (ref 65–99)
Glucose-Capillary: 162 mg/dL — ABNORMAL HIGH (ref 65–99)
Glucose-Capillary: 185 mg/dL — ABNORMAL HIGH (ref 65–99)
Glucose-Capillary: 197 mg/dL — ABNORMAL HIGH (ref 65–99)

## 2017-05-11 LAB — COMPREHENSIVE METABOLIC PANEL
ALBUMIN: 2.5 g/dL — AB (ref 3.5–5.0)
ALK PHOS: 55 U/L (ref 38–126)
ALT: 53 U/L (ref 14–54)
AST: 39 U/L (ref 15–41)
Anion gap: 10 (ref 5–15)
BILIRUBIN TOTAL: 0.8 mg/dL (ref 0.3–1.2)
BUN: 55 mg/dL — AB (ref 6–20)
CALCIUM: 8.8 mg/dL — AB (ref 8.9–10.3)
CO2: 28 mmol/L (ref 22–32)
CREATININE: 1.07 mg/dL — AB (ref 0.44–1.00)
Chloride: 106 mmol/L (ref 101–111)
GFR calc Af Amer: >60 mL/min (ref >60)
GFR calc non Af Amer: 52 mL/min — ABNORMAL LOW (ref >60)
GLUCOSE: 231 mg/dL — AB (ref 65–99)
Potassium: 4.2 mmol/L (ref 3.5–5.1)
Sodium: 144 mmol/L (ref 135–145)
TOTAL PROTEIN: 5.8 g/dL — AB (ref 6.5–8.1)

## 2017-05-11 LAB — CBC
HEMATOCRIT: 35.3 % — AB (ref 36.0–46.0)
HEMOGLOBIN: 11 g/dL — AB (ref 12.0–15.0)
MCH: 29.3 pg (ref 26.0–34.0)
MCHC: 31.2 g/dL (ref 30.0–36.0)
MCV: 94.1 fL (ref 78.0–100.0)
Platelets: 197 10*3/uL (ref 150–400)
RBC: 3.75 MIL/uL — AB (ref 3.87–5.11)
RDW: 14.7 % (ref 11.5–15.5)
WBC: 11.7 10*3/uL — ABNORMAL HIGH (ref 4.0–10.5)

## 2017-05-11 LAB — DIFFERENTIAL
Basophils Absolute: 0 10*3/uL (ref 0.0–0.1)
Basophils Relative: 0 %
EOS ABS: 0 10*3/uL (ref 0.0–0.7)
EOS PCT: 0 %
LYMPHS ABS: 0.8 10*3/uL (ref 0.7–4.0)
LYMPHS PCT: 7 %
MONOS PCT: 1 %
Monocytes Absolute: 0.1 10*3/uL (ref 0.1–1.0)
NEUTROS PCT: 92 %
Neutro Abs: 10.7 10*3/uL — ABNORMAL HIGH (ref 1.7–7.7)

## 2017-05-11 LAB — PREALBUMIN: Prealbumin: 25 mg/dL (ref 18–38)

## 2017-05-11 LAB — TRIGLYCERIDES: Triglycerides: 389 mg/dL — ABNORMAL HIGH (ref <150)

## 2017-05-11 LAB — MAGNESIUM: Magnesium: 2.8 mg/dL — ABNORMAL HIGH (ref 1.7–2.4)

## 2017-05-11 LAB — PHOSPHORUS: Phosphorus: 5.4 mg/dL — ABNORMAL HIGH (ref 2.5–4.6)

## 2017-05-11 MED ORDER — ACETAMINOPHEN 650 MG RE SUPP
650.0000 mg | Freq: Four times a day (QID) | RECTAL | Status: DC | PRN
  Administered 2017-05-11: 650 mg via RECTAL
  Filled 2017-05-11: qty 1

## 2017-05-11 MED ORDER — INSULIN ASPART 100 UNIT/ML ~~LOC~~ SOLN
0.0000 [IU] | SUBCUTANEOUS | Status: AC
  Administered 2017-05-11: 2 [IU] via SUBCUTANEOUS

## 2017-05-11 MED ORDER — PANTOPRAZOLE SODIUM 40 MG IV SOLR
40.0000 mg | INTRAVENOUS | Status: DC
  Administered 2017-05-11 – 2017-05-12 (×2): 40 mg via INTRAVENOUS
  Filled 2017-05-11 (×2): qty 40

## 2017-05-11 MED ORDER — FAT EMULSION 20 % IV EMUL
250.0000 mL | INTRAVENOUS | Status: AC
  Administered 2017-05-11: 250 mL via INTRAVENOUS
  Filled 2017-05-11: qty 250

## 2017-05-11 MED ORDER — TRACE MINERALS CR-CU-MN-SE-ZN 10-1000-500-60 MCG/ML IV SOLN
INTRAVENOUS | Status: AC
  Administered 2017-05-11: 18:00:00 via INTRAVENOUS
  Filled 2017-05-11: qty 1440

## 2017-05-11 NOTE — Progress Notes (Signed)
  Speech Language Pathology Treatment: Dysphagia  Patient Details Name: Mariah White MRN: [id number] DOB: 1951-02-03 Today's Date: 05/11/2017 Time: 5462-7035 SLP Time Calculation (min) (ACUTE ONLY): 20 min  Assessment / Plan / Recommendation Clinical Impression  Dysphagia treatment provided for PO trials. RN informed this SLP that pt was more alert and asking for something to drink. Pt was alert, responsive, following commands without difficulty. Tolerated trials of puree, ice chips, thin and nectar thick liquids without overt s/s of aspiration. Pt was able to feed self without difficulty. Given current respiratory status (continues to be on high flow nasal cannula 40%), pt continues to be at an increased risk of aspiration. Recommend initiating conservative diet of dysphagia 1/ nectar thick liquids with meds crushed in puree and full supervision to cue small bites/ sips and monitor alertness. SLP to f/u for diet tolerance/ consider need for objective evaluation.  HPI HPI: Mariah White is a 67 y.o. female with medical history, per documentation, significant for diastolic CHF, COPD, CAD, hypertension, and prior CVA who was found down at home at approximately 1545 by her son who lives with her.  He called EMS and stated at that time that he last saw her at her usual baseline the previous night at 2200.  EMS apparently noted that the patient would turn her head to look at the staff and was noted to have some right facial droop and left arm weakness.  She was also noted to have some low O2 saturations, but otherwise felt warm. Vital signs are currently stable.  Chest x-ray demonstrates right lower lobe infiltrate       SLP Plan  Continue with current plan of care       Recommendations  Diet recommendations: Dysphagia 1 (puree);Nectar-thick liquid Liquids provided via: Cup;Straw Medication Administration: Crushed with puree Supervision: Patient able to self feed;Full supervision/cueing for  compensatory strategies Compensations: Slow rate;Small sips/bites Postural Changes and/or Swallow Maneuvers: Seated upright 90 degrees                Oral Care Recommendations: Oral care BID Follow up Recommendations: Other (comment)(TBD) SLP Visit Diagnosis: Dysphagia, unspecified (R13.10) Plan: Continue with current plan of care       GO                Metro Kung, MA, CCC-SLP 05/11/2017, 6:58 PM

## 2017-05-11 NOTE — Progress Notes (Signed)
Pt is calm and relaxed and resting... o2 weaned back down to 25lpm and 50%

## 2017-05-11 NOTE — Progress Notes (Signed)
[facility] PROGRESS NOTE  Mariah White NFA:[id number] DOB: 1951/02/02 DOA: 05/03/2017 PCP: Kirstie Peri, MD  Interim summary and HPI 67 year old female with history of diastolic CHF, COPD, CAD, prior CVA and hypertension found at home on the floor by her son.  Patient was in her usual state the previous night.  EMS found her to have some facial droop.  In the ED patient was septic with fever of 101.5 F, hypotensive, hypoxic, tachycardic and elevated leukocytes with elevated lactic acid meeting criteria for sepsis.  Source of infection presumed to be pneumonia.  Assessment/Plan: 1-sepsis secondary to pneumonia with acute respiratory failure with hypoxemia: -With presumption for aspiration pneumonia.  But patient empirically improving with Levaquin antibiotic therapy. planning for a total of 10 days of abx's. -Following  BCID and patient improvement, Aztreonam was discontinued on 2/6. -Continue supportive care and continue weaning oxygen supplementation as needed -Patient has not longer required BIPAP; but still using high flow oxygen supplementation. -will follow rec's from pulmonary service -repeat cXR in am  2-COPD exacerbation -still with some wheezing and rhonchi on exam. -off BIPAP, but requiring high flow oxygen -continue nebulizer and steroids therapy -continue to wean oxygen as tolerated -continue current antibiotics, has almost complete regimen; planning for a total of 10 days of antibiotics. -continue flutter valve -repeat CXR in am -if remains somnolent/confuse will check ABG.  3-chronic diastolic heart failure -No signs of fluid overload and condition has remained stable/compensated currently.  -Continue intermittent use of Lasix just for volume control as needed  -Last echo in May 2018 with preserved ejection fraction at 75%.  4-acute kidney injury -In the setting of poor perfusion with sepsis presentation. -continue judicious IVF's -no signs of fluid  overload or crackles on exam -follow renal function trend  -Cr is improved/essentially back to baseline -will monitor BMET intermittently   5-HTN -continue PRN hydralazine -Patient has remained nothing by mouth due to high risk aspiration. -resume home antihypertensive regimen when tolerating PO's  6-acute toxic metabolic encephalopathy -due to infection and hospital-acquired delirium. -Continue constant reorientation and treatment for infection -Will also add low-dose benzodiazepines for agitation and anxiety -once able to tolerate PO meds will add low dose seroquel.  7-leukocytosis -No further episodes of fever -most likely due to steroids. -follow WBC trend  -continue current antibiotics.  8-moderate protein calorie malnutrition -will use TNA -speech to assist with evaluation to safely advance diet   Code Status: Full Family Communication: No family at bedside. Disposition Plan: Remains in stepdown.  Continue weaning oxygen supplementation as tolerated.  Consultants:  Pulmonary service   Procedures:  See below for X-ray reports   Antibiotics:  Aztreonam 1/31>>2/6  Levaquin 1/31>>  HPI/Subjective: No fever, no CP. Positive tachypnea and demanding use of high flow oxygen supplementation.   Objective: Vitals:   05/11/17 2000 05/11/17 2105  BP:    Pulse:    Resp:    Temp: 99.5 F (37.5 C)   SpO2:  95%    Intake/Output Summary (Last 24 hours) at 05/11/2017 2310 Last data filed at 05/11/2017 1803 Gross per 24 hour  Intake 882 ml  Output 1850 ml  Net -968 ml   Filed Weights   05/09/17 0500 05/10/17 0500 05/11/17 0500  Weight: 78.2 kg (172 lb 6.4 oz) 77.6 kg (171 lb 1.2 oz) 77.4 kg (170 lb 10.2 oz)    Exam:   General: no fever, somnolent and confused, no CP. Breathing still labor overall and requiring high flow oxygen supplementation. Patient with intermittent  episodes of agitation as per nursing staff.   Cardiovascular: S1 and S2, no rubs, no  gallops  Respiratory: diffuse rhonchi, mild exp wheezing, positive tachypnea. Requiring high flow oxygen supplementation.  Abdomen: soft, NT, ND, positive BS.  Musculoskeletal: no edema, no cyanosis   Data Reviewed: Basic Metabolic Panel: Recent Labs  Lab 05/05/17 0544 05/07/17 0429 05/09/17 0539 05/11/17 0545  NA 140 145 143 144  K 4.5 3.9 4.0 4.2  CL 110 101 102 106  CO2 18* 29 27 28   GLUCOSE 136* 147* 128* 231*  BUN 32* 37* 44* 55*  CREATININE 1.12* 1.11* 0.99 1.07*  CALCIUM 9.2 10.0 9.1 8.8*  MG  --   --   --  2.8*  PHOS  --   --   --  5.4*   Liver Function Tests: Recent Labs  Lab 05/11/17 0545  AST 39  ALT 53  ALKPHOS 55  BILITOT 0.8  PROT 5.8*  ALBUMIN 2.5*   CBC: Recent Labs  Lab 05/05/17 0544 05/06/17 0419 05/07/17 0429 05/09/17 0539 05/11/17 0545  WBC 19.1* 16.9* 16.9* 17.8* 11.7*  NEUTROABS  --   --   --   --  10.7*  HGB 11.6* 11.9* 12.7 12.6 11.0*  HCT 36.7 37.5 40.4 39.7 35.3*  MCV 93.1 92.4 92.4 93.4 94.1  PLT 202 215 213 185 197   BNP (last 3 results) Recent Labs    05/03/17 1610  BNP 202.0*    Recent Results (from the past 240 hour(s))  Urine culture     Status: None   Collection Time: 05/03/17  3:54 PM  Result Value Ref Range Status   Specimen Description   Final    URINE, CLEAN CATCH Performed at [facility], [address], [place], [place] [address]    Special Requests   Final    NONE Performed at [facility], [address] [place], [place] [address]    Culture   Final    NO GROWTH Performed at [facility], [address], [place], [place] [address]    Report Status 05/05/2017 FINAL  Final  Blood Culture (routine x 2)     Status: None   Collection Time: 05/03/17  4:09 PM  Result Value Ref Range Status   Specimen Description RIGHT ANTECUBITAL  Final   Special Requests   Final    BOTTLES DRAWN AEROBIC AND ANAEROBIC Blood Culture adequate volume   Culture   Final    NO GROWTH 5 DAYS Performed at [facility], [address] [place], [place] [address]    Report Status 05/08/2017 FINAL  Final  Blood Culture (routine x 2)     Status: None   Collection Time: 05/03/17  4:10 PM  Result Value Ref Range Status   Specimen Description BLOOD LEFT HAND  Final   Special Requests   Final    BOTTLES DRAWN AEROBIC AND ANAEROBIC Blood Culture adequate volume   Culture   Final    NO GROWTH 5 DAYS Performed at [facility], [address], [place], [place] [address]    Report Status 05/08/2017 FINAL  Final  MRSA PCR Screening     Status: None   Collection Time: 05/03/17 10:15 PM  Result Value Ref Range Status   MRSA by PCR NEGATIVE NEGATIVE Final    Comment:        The GeneXpert MRSA Assay (FDA approved for NASAL specimens only), is one component of a comprehensive MRSA colonization surveillance program. It is  not intended to diagnose MRSA infection nor to guide or monitor treatment for MRSA infections.      Studies: No results found.  Scheduled Meds: . budesonide (PULMICORT) nebulizer solution  0.5 mg Nebulization BID  . calcium-vitamin D  1 tablet Oral Daily  . chlorhexidine  15 mL Mouth Rinse BID  . clopidogrel  75 mg Oral Daily  . enoxaparin (LOVENOX) injection  40 mg Subcutaneous Q24H  . ipratropium  0.5 mg Nebulization Q4H  . levalbuterol  0.63 mg Nebulization Q6H  . magnesium oxide  400 mg Oral Daily  . mouth rinse  15 mL Mouth Rinse q12n4p  . methylPREDNISolone (SOLU-MEDROL) injection  60 mg Intravenous Q12H  . pantoprazole (PROTONIX) IV  40 mg Intravenous Q24H   Continuous Infusions: . Marland KitchenTPN (CLINIMIX-E) Adult 60 mL/hr at 05/11/17 1802   And  . fat emulsion 250 mL (05/11/17 1803)  . levofloxacin (LEVAQUIN) IV 500 mg (05/11/17 1759)    Principal Problem:   Sepsis (HCC) Active Problems:   CHF (congestive heart failure) (HCC)   COPD (chronic obstructive pulmonary disease) (HCC)   CAD (coronary artery disease)   Essential hypertension   Stroke (HCC)   Aspiration pneumonia  (HCC)   AKI (acute kidney injury) (HCC)    Time spent: 35 minutes    Vassie Loll  [facility] Pager [phone]. If 7PM-7AM, please contact night-coverage at [email or web address], password [facility] 05/11/2017, 11:10 PM  LOS: 8 days

## 2017-05-11 NOTE — Progress Notes (Signed)
Subjective: She had episodes of being confused and agitated and hypoxic last night.  Her oxygen was increased to 70%.  She also had significant tachycardia.  She improved after several hours and she is now back down to 50% oxygen through a high flow nasal cannula.  She is very sleepy this morning.  Objective: Vital signs in last 24 hours: Temp:  [98 F (36.7 C)-100.1 F (37.8 C)] 98.9 F (37.2 C) (02/08 0400) Resp:  [14-38] 14 (02/08 0500) BP: (90-162)/(66-130) 125/107 (02/08 0500) SpO2:  [88 %-99 %] 96 % (02/08 0500) FiO2 (%):  [50 %-70 %] 50 % (02/08 0250) Weight:  [77.4 kg (170 lb 10.2 oz)] 77.4 kg (170 lb 10.2 oz) (02/08 0500) Weight change: -0.2 kg (-7.1 oz) Last BM Date: 05/05/17  Intake/Output from previous day: 02/07 0701 - 02/08 0700 In: 790 [I.V.:690; IV Piggyback:100] Out: 1000 [Urine:1000]  PHYSICAL EXAM General appearance: Sleepy but arousable.  She does go back to sleep when she is aroused Resp: rhonchi bilaterally Cardio: regular rate and rhythm, S1, S2 normal, no murmur, click, rub or gallop GI: soft, non-tender; bowel sounds normal; no masses,  no organomegaly Extremities: extremities normal, atraumatic, no cyanosis or edema Throat is clear  Lab Results:  Results for orders placed or performed during the hospital encounter of 05/03/17 (from the past 48 hour(s))  Glucose, capillary     Status: Abnormal   Collection Time: 05/10/17  5:14 PM  Result Value Ref Range   Glucose-Capillary 118 (H) 65 - 99 mg/dL  Glucose, capillary     Status: Abnormal   Collection Time: 05/10/17 11:33 PM  Result Value Ref Range   Glucose-Capillary 132 (H) 65 - 99 mg/dL   Comment 1 Notify RN   Glucose, capillary     Status: Abnormal   Collection Time: 05/11/17  4:06 AM  Result Value Ref Range   Glucose-Capillary 197 (H) 65 - 99 mg/dL   Comment 1 Notify RN   Comprehensive metabolic panel     Status: Abnormal   Collection Time: 05/11/17  5:45 AM  Result Value Ref Range   Sodium  144 135 - 145 mmol/L   Potassium 4.2 3.5 - 5.1 mmol/L   Chloride 106 101 - 111 mmol/L   CO2 28 22 - 32 mmol/L   Glucose, Bld 231 (H) 65 - 99 mg/dL   BUN 55 (H) 6 - 20 mg/dL   Creatinine, Ser 1.07 (H) 0.44 - 1.00 mg/dL   Calcium 8.8 (L) 8.9 - 10.3 mg/dL   Total Protein 5.8 (L) 6.5 - 8.1 g/dL   Albumin 2.5 (L) 3.5 - 5.0 g/dL   AST 39 15 - 41 U/L   ALT 53 14 - 54 U/L   Alkaline Phosphatase 55 38 - 126 U/L   Total Bilirubin 0.8 0.3 - 1.2 mg/dL   GFR calc non Af Amer 52 (L) >60 mL/min   GFR calc Af Amer >60 >60 mL/min    Comment: (NOTE) The eGFR has been calculated using the CKD EPI equation. This calculation has not been validated in all clinical situations. eGFR's persistently <60 mL/min signify possible Chronic Kidney Disease.    Anion gap 10 5 - 15    Comment: Performed at [facility], [address] [place], [place] [address]  Magnesium     Status: Abnormal   Collection Time: 05/11/17  5:45 AM  Result Value Ref Range   Magnesium 2.8 (H) 1.7 - 2.4 mg/dL    Comment: Performed at [facility],  [address] [place], [place] [address]  Phosphorus     Status: Abnormal   Collection Time: 05/11/17  5:45 AM  Result Value Ref Range   Phosphorus 5.4 (H) 2.5 - 4.6 mg/dL    Comment: Performed at [facility], [address], [place], [place] [address]  Triglycerides     Status: Abnormal   Collection Time: 05/11/17  5:45 AM  Result Value Ref Range   Triglycerides 389 (H) <150 mg/dL    Comment: Performed at [facility], [address], [place], [place] [address]  CBC     Status: Abnormal   Collection Time: 05/11/17  5:45 AM  Result Value Ref Range   WBC 11.7 (H) 4.0 - 10.5 K/uL   RBC 3.75 (L) 3.87 - 5.11 MIL/uL   Hemoglobin 11.0 (L) 12.0 - 15.0 g/dL   HCT 35.3 (L) 36.0 - 46.0 %   MCV 94.1 78.0 - 100.0 fL   MCH 29.3 26.0 - 34.0 pg   MCHC 31.2 30.0 - 36.0 g/dL   RDW 14.7 11.5 - 15.5 %   Platelets 197 150 - 400 K/uL    Comment: Performed at [facility], [address],  [place], [place] [address]  Differential     Status: Abnormal   Collection Time: 05/11/17  5:45 AM  Result Value Ref Range   Neutrophils Relative % 92 %   Neutro Abs 10.7 (H) 1.7 - 7.7 K/uL   Lymphocytes Relative 7 %   Lymphs Abs 0.8 0.7 - 4.0 K/uL   Monocytes Relative 1 %   Monocytes Absolute 0.1 0.1 - 1.0 K/uL   Eosinophils Relative 0 %   Eosinophils Absolute 0.0 0.0 - 0.7 K/uL   Basophils Relative 0 %   Basophils Absolute 0.0 0.0 - 0.1 K/uL    Comment: Performed at [facility], [address] [place], [place] [address]    ABGS No results for input(s): PHART, PO2ART, TCO2, HCO3 in the last 72 hours.  Invalid input(s): PCO2 CULTURES Recent Results (from the past 240 hour(s))  Urine culture     Status: None   Collection Time: 05/03/17  3:54 PM  Result Value Ref Range Status   Specimen Description   Final    URINE, CLEAN CATCH Performed at [facility], [address], [place], [place] [address]    Special Requests   Final    NONE Performed at [facility], [address], [place], [place] [address]    Culture   Final    NO GROWTH Performed at [facility], [place] [address] [place], [place] [address]    Report Status 05/05/2017 FINAL  Final  Blood Culture (routine x 2)     Status: None   Collection Time: 05/03/17  4:09 PM  Result Value Ref Range Status   Specimen Description RIGHT ANTECUBITAL  Final   Special Requests   Final    BOTTLES DRAWN AEROBIC AND ANAEROBIC Blood Culture adequate volume   Culture   Final    NO GROWTH 5 DAYS Performed at [facility], [address], [place], [place] [address]    Report Status 05/08/2017 FINAL  Final  Blood Culture (routine x 2)     Status: None   Collection Time: 05/03/17  4:10 PM  Result Value Ref Range Status   Specimen Description BLOOD LEFT HAND  Final   Special Requests   Final    BOTTLES DRAWN AEROBIC AND ANAEROBIC Blood Culture adequate volume   Culture   Final  NO GROWTH 5 DAYS Performed at [facility],  [address] [place], [place] [address]    Report Status 05/08/2017 FINAL  Final  MRSA PCR Screening     Status: None   Collection Time: 05/03/17 10:15 PM  Result Value Ref Range Status   MRSA by PCR NEGATIVE NEGATIVE Final    Comment:        The GeneXpert MRSA Assay (FDA approved for NASAL specimens only), is one component of a comprehensive MRSA colonization surveillance program. It is not intended to diagnose MRSA infection nor to guide or monitor treatment for MRSA infections.    Studies/Results: No results found.  Medications:  Prior to Admission:  Medications Prior to Admission  Medication Sig Dispense Refill Last Dose  . albuterol (PROVENTIL HFA;VENTOLIN HFA) 108 (90 BASE) MCG/ACT inhaler Inhale 2 puffs into the lungs 2 (two) times daily.   unknown  . ALPRAZolam (XANAX) 1 MG tablet Take 1 tablet (1 mg total) 2 (two) times daily by mouth. 60 tablet 2 05/02/2017 at Unknown time  . amLODipine (NORVASC) 10 MG tablet Take 10 mg by mouth daily.    05/02/2017 at Unknown time  . ARIPiprazole (ABILIFY) 5 MG tablet Take 1 tablet (5 mg total) at bedtime by mouth. 30 tablet 2 05/02/2017 at Unknown time  . clopidogrel (PLAVIX) 75 MG tablet Take 75 mg by mouth daily.   05/02/2017 at Unknown time  . FLUoxetine (PROZAC) 20 MG capsule Take 1 capsule (20 mg total) daily by mouth. 30 capsule 2 05/02/2017 at Unknown time  . Fluticasone-Salmeterol (ADVAIR) 250-50 MCG/DOSE AEPB Inhale 1 puff into the lungs 2 (two) times daily.   05/02/2017 at Unknown time  . furosemide (LASIX) 40 MG tablet TAKE 1 AND 1/2 TABLETS BY MOUTH DAILY 135 tablet 2 05/02/2017 at Unknown time  . gabapentin (NEURONTIN) 600 MG tablet Take 600 mg 3 (three) times daily by mouth.  3 05/02/2017 at Unknown time  . lisinopril (PRINIVIL,ZESTRIL) 40 MG tablet Take 40 mg by mouth daily.   05/02/2017 at Unknown time  . metoprolol succinate (TOPROL-XL) 50 MG 24 hr tablet Take 25 mg by mouth daily. Take with or immediately following a meal.    05/02/2017 at Unknown time  . oxyCODONE-acetaminophen (PERCOCET/ROXICET) 5-325 MG tablet Take 1-2 tablets by mouth every 4 (four) hours as needed for severe pain.   05/02/2017 at Unknown time  . pantoprazole (PROTONIX) 40 MG tablet Take 40 mg by mouth daily.   05/02/2017 at Unknown time  . pravastatin (PRAVACHOL) 80 MG tablet Take 80 mg by mouth daily.   05/02/2017 at Unknown time  . prazosin (MINIPRESS) 2 MG capsule Take 1 capsule (2 mg total) at bedtime by mouth. 30 capsule 2 05/02/2017 at Unknown time  . Calcium Carb-Cholecalciferol (CALCIUM 600-D PO) Take 1 tablet by mouth daily.   unknown  . Magnesium Oxide 400 (240 Mg) MG TABS Take 1 tablet (400 mg total) by mouth daily. 30 tablet 6 unknown  . Omega-3 Fatty Acids (FISH OIL) 1000 MG CAPS Take by mouth 2 (two) times daily.   unknown   Scheduled: . budesonide (PULMICORT) nebulizer solution  0.5 mg Nebulization BID  . calcium-vitamin D  1 tablet Oral Daily  . chlorhexidine  15 mL Mouth Rinse BID  . clopidogrel  75 mg Oral Daily  . enoxaparin (LOVENOX) injection  40 mg Subcutaneous Q24H  . ipratropium  0.5 mg Nebulization Q4H  . levalbuterol  0.63 mg Nebulization Q6H  . magnesium oxide  400 mg  Oral Daily  . mouth rinse  15 mL Mouth Rinse q12n4p  . methylPREDNISolone (SOLU-MEDROL) injection  60 mg Intravenous Q12H  . pantoprazole  40 mg Oral Daily  . pravastatin  80 mg Oral q1800   Continuous: . levofloxacin (LEVAQUIN) IV Stopped (05/10/17 1815)  . Marland KitchenTPN (CLINIMIX-E) Adult 40 mL/hr at 05/10/17 1730   JKK:[id number], HYDROcodone-acetaminophen, HYDROcodone-acetaminophen, LORazepam, metoprolol tartrate, ondansetron **OR** ondansetron (ZOFRAN) IV  Assesment: She was admitted with sepsis.  She had aspiration pneumonia.  She is being treated for that and is slowly improving.  She is at significant risk still for aspiration and she is on TPN now.  Speech therapy is involved.  She had another episode of shortness of breath and hypoxia last night and  I wonder if she aspirated again.  Will check chest x-ray.  She had a difficult night last night and she is sleepy this morning if she does not wake up in the next hour or so and get back to her baseline mental status and I think we should check a blood gas and make sure she does not have CO2 narcosis  She has COPD at baseline and she is being treated for that  She has significantly altered mental status which is an ongoing issue but better but we could not prove a stroke. Principal Problem:   Sepsis ([place]) Active Problems:   CHF (congestive heart failure) (HCC)   COPD (chronic obstructive pulmonary disease) (HCC)   CAD (coronary artery disease)   Essential hypertension   Stroke (HCC)   Aspiration pneumonia (HCC)   AKI (acute kidney injury) ([place])    Plan: Check chest x-ray.  Check blood gas if she does not get back to her baseline mental status.    LOS: 8 days   Kivon Aprea L 05/11/2017, 7:42 AM

## 2017-05-11 NOTE — Progress Notes (Signed)
SLP Cancellation Note  Patient Details Name: Mariah White MRN: [id number] DOB: 1951/02/08   Cancelled treatment:        Pt not medically ready. Pt continues to require 50 percent O2 support with waxing and waning mentation. SLP to follow up for MBSS when pt is more medically stable. Aspiration risk remains significant in setting of multiple medical co morbidities.  Agustina Witzke Vonda Antigua MA, CCC-SLP Acute Care Speech Language Pathologist    Noelle Penner 05/11/2017, 9:41 AM

## 2017-05-11 NOTE — Progress Notes (Signed)
PHARMACY - ADULT TOTAL PARENTERAL NUTRITION CONSULT NOTE   Pharmacy Consult for TPN Indication: no substantial nutrition in last week or so  Patient Measurements: Height: 5\' 2"  (157.5 cm) Weight: 170 lb 10.2 oz (77.4 kg) IBW/kg (Calculated) : 50.1   Body mass index is 31.21 kg/m.  Assessment: 67 year old female with history of diastolic CHF, COPD, CAD, prior CVA and hypertension found at home on the floor by her son. Patient was in her usual state the previous night. EMS found her to have some facial droop.  In the ED patient was septic with fever of 101.5 F, hypotensive, hypoxic, tachycardic and elevated leukocytes with elevated lactic acid meeting criteria for sepsis.  Source of infection presumed to be pneumonia. Patient also have CHF, CAD. She has significantly altered mental status which is an ongoing issue per MD.   Insulin requirements in the past 24 hours: none, pt not on any meds for diabetes or glucose control PTA Lytes: TG are 389 will follow. Phos is slightly elevated.   Renal: OK TPN Access:  PICC line inserted 2/7 TPN start date: 05/10/17 Nutritional Goals (per RD recommendations) - Ideal Body Weight:  50 kg BMI:  Body mass index is 31.21 kg/m.  Estimated Nutritional Needs:  Kcal:  1550-1700 (20-22 kcal/kg bw) Protein:  70-80g Pro (1.4-1.6 g/kg ibw) Fat:  480 kcal Fluid:  Per MD's goals  Current TPN at 67mls/hr will provide:  CHO 734kcals    Prot 72g or 288kcals   Fats 480kcal  Total cal = 1502kcal/day  Goal TPN rate: 65-41mls/.hr  Current Nutrition: no substantial nutrition in last 5 days or so  Plan:  Increase to Clinimix E 5/15 at 87mL/hr. IVFE(lipids) 20% at 75mls/hr x 12 hrs Monitor labs, electrolytes, sugars, fluid status and glucose tolerance Add MVI, trace elements to TPN Check CBG's q4h with SSI and adjust as needed Monitor TPN labs  Elder Cyphers, BS Loura Back, BCPS Clinical Pharmacist Pager [phone] 05/11/2017,11:08 AM

## 2017-05-11 NOTE — Progress Notes (Signed)
Initial Nutrition Assessment  DOCUMENTATION CODES:  Obesity unspecified  INTERVENTION:  TPN per pharmacy to meet below kcal/pro needs  W/ functioning gut, Pt most appropriate for post-pyloric small-bore feeding tube placement over TPN. Enteral nutrition vastly preferred over Parenteral Nutrition due to its ability to preserve mucosal integrity/decrease risk of infection, support gut-associated lymphoid tissue, avoid TPN induced hyperglycemia, reduce risk of cholecystitis and support efficient substrate utilization with first pass metabolism.   NUTRITION DIAGNOSIS:  Inadequate oral intake related to inability to eat d/t lethargy/confusion, high aspiration risk as evidenced by NPO status  GOAL:  Patient will meet greater than or equal to 90% of their needs  MONITOR:  Diet advancement, Labs, Weight trends, I & O's  REASON FOR ASSESSMENT:  Found to be on TPN (not consulted)    ASSESSMENT:  67 y/o female PMHx CHF, CAD, HTN, CVA, Depression/Anxiety, COPD. Found down by son at home. Admitted for sepsis/aki w/ presumed aspiration PNA.   Pt has been unable to eat due to persistent AMS and respiratory distress.   Patient had been ordered TPN due to prolonged length of time w/o intake.   Today, when seen, all patient says is "thirsty. Thirsty, thirsty".   Abdomen is soft, nondistended.  Pt has been significantly diuresed since admit. First measured weight was just under 190 lbs. She is now just over 170. Loss of 20 lbs x 1 week. Long term, she appears to have been weighed around 180 lbs for the last 6 months.   Physical Exam: WDL, No wasting identified  Labs: Bgs: 130-230, WBC: 11.7, Albumin:2.5, Phos/Mag elevated 5.4/2.8, BUN/Creat:55/1.07 Meds: Calc/D, Mag Ox, Methylprednisolone, ppi, TPN, IV abx  Recent Labs  Lab 05/07/17 0429 05/09/17 0539 05/11/17 0545  NA 145 143 144  K 3.9 4.0 4.2  CL 101 102 106  CO2 29 27 28   BUN 37* 44* 55*  CREATININE 1.11* 0.99 1.07*  CALCIUM 10.0  9.1 8.8*  MG  --   --  2.8*  PHOS  --   --  5.4*  GLUCOSE 147* 128* 231*    NUTRITION - FOCUSED PHYSICAL EXAM: WDL. No discernible wasting  Diet Order:  Diet NPO time specified TPN (CLINIMIX-E) Adult  EDUCATION NEEDS:  No education needs have been identified at this time  Skin:  Skin Assessment: Reviewed RN Assessment  Last BM:  2/6  Height:  Ht Readings from Last 1 Encounters:  05/04/17 5\' 2"  (1.575 m)   Weight:  Wt Readings from Last 1 Encounters:  05/11/17 170 lb 10.2 oz (77.4 kg)   Wt Readings from Last 10 Encounters:  05/11/17 170 lb 10.2 oz (77.4 kg)  02/06/17 181 lb (82.1 kg)  11/23/16 178 lb (80.7 kg)  11/13/16 177 lb (80.3 kg)  10/06/16 180 lb (81.6 kg)  09/20/16 177 lb (80.3 kg)  09/07/16 177 lb (80.3 kg)  06/27/16 168 lb 12.8 oz (76.6 kg)  05/01/16 169 lb 9.6 oz (76.9 kg)  01/18/16 165 lb 12.8 oz (75.2 kg)   Ideal Body Weight:  50 kg  BMI:  Body mass index is 31.21 kg/m.  Estimated Nutritional Needs:  Kcal:  1550-1700 (20-22 kcal/kg bw) Protein:  70-80g Pro (1.4-1.6 g/kg ibw) Fluid:  Per MD's goals  Christophe Louis RD, LDN, CNSC Clinical Nutrition Pager: [phone] 05/11/2017 1:38 PM

## 2017-05-11 NOTE — Progress Notes (Signed)
Inpatient Diabetes Program Recommendations  AACE/ADA: New Consensus Statement on Inpatient Glycemic Control (2015)  Target Ranges:  Prepandial:   less than 140 mg/dL      Peak postprandial:   less than 180 mg/dL (1-2 hours)      Critically ill patients:  140 - 180 mg/dL   Lab Results  Component Value Date   GLUCAP 223 (H) 05/11/2017    Review of Glycemic ControlResults for Mariah White, Mariah White (MRN [id number]) as of 05/11/2017 11:16  Ref. Range 05/10/2017 17:14 05/10/2017 23:33 05/11/2017 04:06 05/11/2017 08:07  Glucose-Capillary Latest Ref Range: 65 - 99 mg/dL 409 (H) 811 (H) 914 (H) 223 (H)    Diabetes history: None Outpatient Diabetes medications: None Inpatient Diabetes Program Recommendations:    Note blood sugars increased with TPN.  Please consider adding Novolog correction sensitive q 4 hours.   Thanks,  Beryl Meager, RN, BC-ADM Inpatient Diabetes Coordinator Pager [phone] (8a-5p)

## 2017-05-12 ENCOUNTER — Inpatient Hospital Stay (HOSPITAL_COMMUNITY): Payer: Medicare Other

## 2017-05-12 LAB — GLUCOSE, CAPILLARY
GLUCOSE-CAPILLARY: 229 mg/dL — AB (ref 65–99)
GLUCOSE-CAPILLARY: 193 mg/dL — AB (ref 65–99)
Glucose-Capillary: 173 mg/dL — ABNORMAL HIGH (ref 65–99)
Glucose-Capillary: 226 mg/dL — ABNORMAL HIGH (ref 65–99)
Glucose-Capillary: 231 mg/dL — ABNORMAL HIGH (ref 65–99)

## 2017-05-12 MED ORDER — IPRATROPIUM BROMIDE 0.02 % IN SOLN
0.5000 mg | Freq: Four times a day (QID) | RESPIRATORY_TRACT | Status: DC
  Administered 2017-05-12 – 2017-05-13 (×4): 0.5 mg via RESPIRATORY_TRACT
  Filled 2017-05-12 (×4): qty 2.5

## 2017-05-12 MED ORDER — FUROSEMIDE 10 MG/ML IJ SOLN
20.0000 mg | Freq: Two times a day (BID) | INTRAMUSCULAR | Status: AC
  Administered 2017-05-12 – 2017-05-13 (×2): 20 mg via INTRAVENOUS
  Filled 2017-05-12 (×2): qty 2

## 2017-05-12 MED ORDER — M.V.I. ADULT IV INJ
70.0000 mL/h | INJECTION | INTRAVENOUS | Status: DC
Start: 2017-05-12 — End: 2017-05-13
  Administered 2017-05-12: 18:00:00 via INTRAVENOUS
  Filled 2017-05-12: qty 1680

## 2017-05-12 MED ORDER — METHYLPREDNISOLONE SODIUM SUCC 40 MG IJ SOLR
40.0000 mg | Freq: Two times a day (BID) | INTRAMUSCULAR | Status: DC
  Administered 2017-05-12 – 2017-05-14 (×4): 40 mg via INTRAVENOUS
  Filled 2017-05-12 (×4): qty 1

## 2017-05-12 MED ORDER — FAT EMULSION 20 % IV EMUL
250.0000 mL | INTRAVENOUS | Status: DC
  Administered 2017-05-12: 250 mL via INTRAVENOUS
  Filled 2017-05-12: qty 250

## 2017-05-12 MED ORDER — QUETIAPINE FUMARATE 25 MG PO TABS
25.0000 mg | ORAL_TABLET | Freq: Every day | ORAL | Status: DC
  Administered 2017-05-12 – 2017-05-16 (×5): 25 mg via ORAL
  Filled 2017-05-12 (×8): qty 1

## 2017-05-12 MED ORDER — INSULIN ASPART 100 UNIT/ML ~~LOC~~ SOLN
0.0000 [IU] | Freq: Once | SUBCUTANEOUS | Status: AC
  Administered 2017-05-12: 2 [IU] via SUBCUTANEOUS

## 2017-05-12 MED ORDER — ACETAMINOPHEN 325 MG PO TABS: 650.0000 mg | ORAL_TABLET | Freq: Four times a day (QID) | ORAL | Status: DC | PRN

## 2017-05-12 MED ORDER — INSULIN ASPART 100 UNIT/ML ~~LOC~~ SOLN
0.0000 [IU] | Freq: Four times a day (QID) | SUBCUTANEOUS | Status: DC
  Administered 2017-05-12: 4 [IU] via SUBCUTANEOUS
  Administered 2017-05-12 – 2017-05-13 (×3): 2 [IU] via SUBCUTANEOUS
  Administered 2017-05-13 (×2): 4 [IU] via SUBCUTANEOUS
  Administered 2017-05-15: 2 [IU] via SUBCUTANEOUS

## 2017-05-12 NOTE — Progress Notes (Signed)
  Speech Language Pathology Treatment: Dysphagia  Patient Details Name: Mariah White MRN: [id number] DOB: 1950/08/18 Today's Date: 05/12/2017 Time: 3875-6433 SLP Time Calculation (min) (ACUTE ONLY): 17 min  Assessment / Plan / Recommendation Clinical Impression  Pt seen for upgraded po trials this AM after reportedly doing well over night and with AM meal. Pt alert and cooperative and able to self feed puree and NTL via straw sips. Pt appreciative of thin water trials presented via cup sips. Pt with immediate cough on 1 of 4 sips thin water. Cognition continues to improved. Recommend continuing diet as ordered and SLP to follow up on Monday for upgrades as appropriate.   HPI HPI: Mariah White is a 68 y.o. female with medical history, per documentation, significant for diastolic CHF, COPD, CAD, hypertension, and prior CVA who was found down at home at approximately 1545 by her son who lives with her.  He called EMS and stated at that time that he last saw her at her usual baseline the previous night at 2200.  EMS apparently noted that the patient would turn her head to look at the staff and was noted to have some right facial droop and left arm weakness.  She was also noted to have some low O2 saturations, but otherwise felt warm. Vital signs are currently stable.  Chest x-ray demonstrates right lower lobe infiltrate       SLP Plan  Continue with current plan of care       Recommendations  Diet recommendations: Dysphagia 1 (puree);Nectar-thick liquid Liquids provided via: Cup;Straw Medication Administration: Crushed with puree Supervision: Patient able to self feed;Full supervision/cueing for compensatory strategies Compensations: Slow rate;Small sips/bites Postural Changes and/or Swallow Maneuvers: Seated upright 90 degrees                Oral Care Recommendations: Oral care BID Follow up Recommendations: Other (comment) Plan: Continue with current plan of care       Thank  you,  Havery Moros, CCC-SLP [phone]                 Yarden Hillis 05/12/2017, 11:58 AM

## 2017-05-12 NOTE — Progress Notes (Signed)
PHARMACY - ADULT TOTAL PARENTERAL NUTRITION CONSULT NOTE   Pharmacy Consult for TPN Indication: no substantial nutrition in last week or so  Patient Measurements: Height: 5\' 2"  (157.5 cm) Weight: 171 lb 11.8 oz (77.9 kg) IBW/kg (Calculated) : 50.1   Body mass index is 31.41 kg/m.  Assessment: 67 year old female with history of diastolic CHF, COPD, CAD, prior CVA and hypertension found at home on the floor by her son. Patient was in her usual state the previous night. EMS found her to have some facial droop.  In the ED patient was septic with fever of 101.5 F, hypotensive, hypoxic, tachycardic and elevated leukocytes with elevated lactic acid meeting criteria for sepsis.  Source of infection presumed to be pneumonia. Patient also have CHF, CAD. She has significantly altered mental status which is an ongoing issue per MD.   Insulin requirements in the past 24 hours: 4 units, pt not on any meds for diabetes or glucose control PTA Lytes: TG are 389 will follow. Phos is slightly elevated.   Renal: OK TPN Access:  PICC line inserted 2/7 TPN start date: 05/10/17 Nutritional Goals (per RD recommendations) - Ideal Body Weight:  50 kg BMI:  Body mass index is 31.21 kg/m.  Estimated Nutritional Needs:  Kcal:  1550-1700 (20-22 kcal/kg bw) Protein:  70-80g Pro (1.4-1.6 g/kg ibw) Fat:  480 kcal Fluid:  Per MD's goals  Current TPN at 65mls/hr will provide:  CHO 859kcals   Prot  84g or 336kcals   Fats 480kcal  Total cal = 1675 kcal/day  Goal TPN rate: 64mls/.hr  Current Nutrition: no substantial nutrition in last 5 days or so  Plan:  Increase to Clinimix E 5/15 at 9mL/hr. IVFE(lipids) 20% at 22mls/hr x 12 hrs Monitor labs, electrolytes, sugars, fluid status and glucose tolerance Add MVI, trace elements to TPN Check CBG's q6h with SSI and adjust as needed Monitor TPN labs  Elder Cyphers, BS Loura Back, BCPS Clinical Pharmacist Pager [phone] 05/12/2017,9:03 AM

## 2017-05-12 NOTE — Progress Notes (Signed)
Subjective: She is much more alert this morning.  She has no complaints.  She was found at home on the floor by her son on the day of admission.  She was found to be septic when she came to the emergency department and appeared to have pneumonia.  She has had trouble with altered mental status which is an ongoing issue but is better today.  She had significant issues with swallowing but speech evaluation yesterday felt that she could use dysphagia 1 diet with nectar thick liquids.  No overt aspiration in the last 24 hours  Objective: Vital signs in last 24 hours: Temp:  [97.8 F (36.6 C)-99.5 F (37.5 C)] 98 F (36.7 C) (02/09 0400) Resp:  [19-25] 21 (02/09 0100) BP: (108-152)/(63-94) 144/82 (02/09 0000) SpO2:  [82 %-100 %] 92 % (02/09 0100) FiO2 (%):  [40 %-50 %] 40 % (02/09 0020) Weight:  [77.9 kg (171 lb 11.8 oz)] 77.9 kg (171 lb 11.8 oz) (02/09 0400) Weight change: 0.5 kg (1 lb 1.6 oz) Last BM Date: 05/12/16  Intake/Output from previous day: 02/08 0701 - 02/09 0700 In: 882 [P.O.:360; I.V.:522] Out: 852 [Urine:850; Stool:2]  PHYSICAL EXAM General appearance: alert, cooperative and no distress Resp: clear to auscultation bilaterally Cardio: regular rate and rhythm, S1, S2 normal, no murmur, click, rub or gallop GI: soft, non-tender; bowel sounds normal; no masses,  no organomegaly Extremities: extremities normal, atraumatic, no cyanosis or edema Mental status better  Lab Results:  Results for orders placed or performed during the hospital encounter of 05/03/17 (from the past 48 hour(s))  Glucose, capillary     Status: Abnormal   Collection Time: 05/10/17  5:14 PM  Result Value Ref Range   Glucose-Capillary 118 (H) 65 - 99 mg/dL  Glucose, capillary     Status: Abnormal   Collection Time: 05/10/17 11:33 PM  Result Value Ref Range   Glucose-Capillary 132 (H) 65 - 99 mg/dL   Comment 1 Notify RN   Glucose, capillary     Status: Abnormal   Collection Time: 05/11/17  4:06 AM   Result Value Ref Range   Glucose-Capillary 197 (H) 65 - 99 mg/dL   Comment 1 Notify RN   Comprehensive metabolic panel     Status: Abnormal   Collection Time: 05/11/17  5:45 AM  Result Value Ref Range   Sodium 144 135 - 145 mmol/L   Potassium 4.2 3.5 - 5.1 mmol/L   Chloride 106 101 - 111 mmol/L   CO2 28 22 - 32 mmol/L   Glucose, Bld 231 (H) 65 - 99 mg/dL   BUN 55 (H) 6 - 20 mg/dL   Creatinine, Ser 1.07 (H) 0.44 - 1.00 mg/dL   Calcium 8.8 (L) 8.9 - 10.3 mg/dL   Total Protein 5.8 (L) 6.5 - 8.1 g/dL   Albumin 2.5 (L) 3.5 - 5.0 g/dL   AST 39 15 - 41 U/L   ALT 53 14 - 54 U/L   Alkaline Phosphatase 55 38 - 126 U/L   Total Bilirubin 0.8 0.3 - 1.2 mg/dL   GFR calc non Af Amer 52 (L) >60 mL/min   GFR calc Af Amer >60 >60 mL/min    Comment: (NOTE) The eGFR has been calculated using the CKD EPI equation. This calculation has not been validated in all clinical situations. eGFR's persistently <60 mL/min signify possible Chronic Kidney Disease.    Anion gap 10 5 - 15    Comment: Performed at [facility], [address] [place], [place]  [address]  Prealbumin     Status: None   Collection Time: 05/11/17  5:45 AM  Result Value Ref Range   Prealbumin 25.0 18 - 38 mg/dL    Comment: Performed at [facility] [address], [place], [place] [address]  Magnesium     Status: Abnormal   Collection Time: 05/11/17  5:45 AM  Result Value Ref Range   Magnesium 2.8 (H) 1.7 - 2.4 mg/dL    Comment: Performed at [facility], [address] [place], [place] [address]  Phosphorus     Status: Abnormal   Collection Time: 05/11/17  5:45 AM  Result Value Ref Range   Phosphorus 5.4 (H) 2.5 - 4.6 mg/dL    Comment: Performed at [facility], [address], [place], [place] [address]  Triglycerides     Status: Abnormal   Collection Time: 05/11/17  5:45 AM  Result Value Ref Range   Triglycerides 389 (H) <150 mg/dL    Comment: Performed at [facility], [address] [place], [place] [address]   CBC     Status: Abnormal   Collection Time: 05/11/17  5:45 AM  Result Value Ref Range   WBC 11.7 (H) 4.0 - 10.5 K/uL   RBC 3.75 (L) 3.87 - 5.11 MIL/uL   Hemoglobin 11.0 (L) 12.0 - 15.0 g/dL   HCT 35.3 (L) 36.0 - 46.0 %   MCV 94.1 78.0 - 100.0 fL   MCH 29.3 26.0 - 34.0 pg   MCHC 31.2 30.0 - 36.0 g/dL   RDW 14.7 11.5 - 15.5 %   Platelets 197 150 - 400 K/uL    Comment: Performed at [facility], [address], [place], [place] [address]  Differential     Status: Abnormal   Collection Time: 05/11/17  5:45 AM  Result Value Ref Range   Neutrophils Relative % 92 %   Neutro Abs 10.7 (H) 1.7 - 7.7 K/uL   Lymphocytes Relative 7 %   Lymphs Abs 0.8 0.7 - 4.0 K/uL   Monocytes Relative 1 %   Monocytes Absolute 0.1 0.1 - 1.0 K/uL   Eosinophils Relative 0 %   Eosinophils Absolute 0.0 0.0 - 0.7 K/uL   Basophils Relative 0 %   Basophils Absolute 0.0 0.0 - 0.1 K/uL    Comment: Performed at [facility], [address], [place], [place] [address]  Glucose, capillary     Status: Abnormal   Collection Time: 05/11/17  8:07 AM  Result Value Ref Range   Glucose-Capillary 223 (H) 65 - 99 mg/dL  Glucose, capillary     Status: Abnormal   Collection Time: 05/11/17 11:29 AM  Result Value Ref Range   Glucose-Capillary 129 (H) 65 - 99 mg/dL   Comment 1 Notify RN    Comment 2 Document in Chart   Glucose, capillary     Status: Abnormal   Collection Time: 05/11/17  3:58 PM  Result Value Ref Range   Glucose-Capillary 162 (H) 65 - 99 mg/dL   Comment 1 Notify RN    Comment 2 Document in Chart   Glucose, capillary     Status: Abnormal   Collection Time: 05/11/17  5:27 PM  Result Value Ref Range   Glucose-Capillary 160 (H) 65 - 99 mg/dL  Glucose, capillary     Status: Abnormal   Collection Time: 05/11/17  8:23 PM  Result Value Ref Range   Glucose-Capillary 185 (H) 65 - 99 mg/dL  Glucose, capillary     Status: Abnormal  Collection Time: 05/11/17 11:47 PM  Result Value Ref Range   Glucose-Capillary 129  (H) 65 - 99 mg/dL   Comment 1 Notify RN    Comment 2 Document in Chart   Glucose, capillary     Status: Abnormal   Collection Time: 05/12/17  4:32 AM  Result Value Ref Range   Glucose-Capillary 173 (H) 65 - 99 mg/dL   Comment 1 Notify RN    Comment 2 Document in Chart   Glucose, capillary     Status: Abnormal   Collection Time: 05/12/17  8:04 AM  Result Value Ref Range   Glucose-Capillary 229 (H) 65 - 99 mg/dL    ABGS No results for input(s): PHART, PO2ART, TCO2, HCO3 in the last 72 hours.  Invalid input(s): PCO2 CULTURES Recent Results (from the past 240 hour(s))  Urine culture     Status: None   Collection Time: 05/03/17  3:54 PM  Result Value Ref Range Status   Specimen Description   Final    URINE, CLEAN CATCH Performed at [facility], [address] [place], [place] [address]    Special Requests   Final    NONE Performed at [facility], [address], [place], [place] [address]    Culture   Final    NO GROWTH Performed at [facility], [place] [address] [place], [place] [address]    Report Status 05/05/2017 FINAL  Final  Blood Culture (routine x 2)     Status: None   Collection Time: 05/03/17  4:09 PM  Result Value Ref Range Status   Specimen Description RIGHT ANTECUBITAL  Final   Special Requests   Final    BOTTLES DRAWN AEROBIC AND ANAEROBIC Blood Culture adequate volume   Culture   Final    NO GROWTH 5 DAYS Performed at [facility], [address], [place], [place] [address]    Report Status 05/08/2017 FINAL  Final  Blood Culture (routine x 2)     Status: None   Collection Time: 05/03/17  4:10 PM  Result Value Ref Range Status   Specimen Description BLOOD LEFT HAND  Final   Special Requests   Final    BOTTLES DRAWN AEROBIC AND ANAEROBIC Blood Culture adequate volume   Culture   Final    NO GROWTH 5 DAYS Performed at [facility], [address] [place], [place] [address]    Report Status 05/08/2017 FINAL  Final  MRSA PCR Screening     Status:  None   Collection Time: 05/03/17 10:15 PM  Result Value Ref Range Status   MRSA by PCR NEGATIVE NEGATIVE Final    Comment:        The GeneXpert MRSA Assay (FDA approved for NASAL specimens only), is one component of a comprehensive MRSA colonization surveillance program. It is not intended to diagnose MRSA infection nor to guide or monitor treatment for MRSA infections.    Studies/Results: Dg Chest Port 1 View  Result Date: 05/12/2017 CLINICAL DATA:  Shortness of breath. EXAM: PORTABLE CHEST 1 VIEW COMPARISON:  Chest x-ray dated May 05, 2017. FINDINGS: Interval placement of a right-sided PICC line with the tip in the distal SVC. Unchanged borderline cardiomegaly. Mild interstitial edema. Significantly improved diffuse bilateral airspace disease. No focal consolidation, pleural effusion, or pneumothorax. No acute osseous abnormality. IMPRESSION: 1. Significantly improved diffuse bilateral airspace disease, likely reflecting resolving infection or alveolar edema. Mild persistent interstitial pulmonary edema. Electronically Signed   By: Titus Dubin M.D.   On:  05/12/2017 07:52    Medications:  Prior to Admission:  Medications Prior to Admission  Medication Sig Dispense Refill Last Dose  . albuterol (PROVENTIL HFA;VENTOLIN HFA) 108 (90 BASE) MCG/ACT inhaler Inhale 2 puffs into the lungs 2 (two) times daily.   unknown  . ALPRAZolam (XANAX) 1 MG tablet Take 1 tablet (1 mg total) 2 (two) times daily by mouth. 60 tablet 2 05/02/2017 at Unknown time  . amLODipine (NORVASC) 10 MG tablet Take 10 mg by mouth daily.    05/02/2017 at Unknown time  . ARIPiprazole (ABILIFY) 5 MG tablet Take 1 tablet (5 mg total) at bedtime by mouth. 30 tablet 2 05/02/2017 at Unknown time  . clopidogrel (PLAVIX) 75 MG tablet Take 75 mg by mouth daily.   05/02/2017 at Unknown time  . FLUoxetine (PROZAC) 20 MG capsule Take 1 capsule (20 mg total) daily by mouth. 30 capsule 2 05/02/2017 at Unknown time  .  Fluticasone-Salmeterol (ADVAIR) 250-50 MCG/DOSE AEPB Inhale 1 puff into the lungs 2 (two) times daily.   05/02/2017 at Unknown time  . furosemide (LASIX) 40 MG tablet TAKE 1 AND 1/2 TABLETS BY MOUTH DAILY 135 tablet 2 05/02/2017 at Unknown time  . gabapentin (NEURONTIN) 600 MG tablet Take 600 mg 3 (three) times daily by mouth.  3 05/02/2017 at Unknown time  . lisinopril (PRINIVIL,ZESTRIL) 40 MG tablet Take 40 mg by mouth daily.   05/02/2017 at Unknown time  . metoprolol succinate (TOPROL-XL) 50 MG 24 hr tablet Take 25 mg by mouth daily. Take with or immediately following a meal.   05/02/2017 at Unknown time  . oxyCODONE-acetaminophen (PERCOCET/ROXICET) 5-325 MG tablet Take 1-2 tablets by mouth every 4 (four) hours as needed for severe pain.   05/02/2017 at Unknown time  . pantoprazole (PROTONIX) 40 MG tablet Take 40 mg by mouth daily.   05/02/2017 at Unknown time  . pravastatin (PRAVACHOL) 80 MG tablet Take 80 mg by mouth daily.   05/02/2017 at Unknown time  . prazosin (MINIPRESS) 2 MG capsule Take 1 capsule (2 mg total) at bedtime by mouth. 30 capsule 2 05/02/2017 at Unknown time  . Calcium Carb-Cholecalciferol (CALCIUM 600-D PO) Take 1 tablet by mouth daily.   unknown  . Magnesium Oxide 400 (240 Mg) MG TABS Take 1 tablet (400 mg total) by mouth daily. 30 tablet 6 unknown  . Omega-3 Fatty Acids (FISH OIL) 1000 MG CAPS Take by mouth 2 (two) times daily.   unknown   Scheduled: . budesonide (PULMICORT) nebulizer solution  0.5 mg Nebulization BID  . calcium-vitamin D  1 tablet Oral Daily  . chlorhexidine  15 mL Mouth Rinse BID  . clopidogrel  75 mg Oral Daily  . enoxaparin (LOVENOX) injection  40 mg Subcutaneous Q24H  . insulin aspart  0-10 Units Subcutaneous Q6H  . insulin aspart  0-10 Units Subcutaneous Once  . ipratropium  0.5 mg Nebulization Q6H  . levalbuterol  0.63 mg Nebulization Q6H  . magnesium oxide  400 mg Oral Daily  . mouth rinse  15 mL Mouth Rinse q12n4p  . methylPREDNISolone (SOLU-MEDROL)  injection  60 mg Intravenous Q12H  . pantoprazole (PROTONIX) IV  40 mg Intravenous Q24H   Continuous: . Marland KitchenTPN (CLINIMIX-E) Adult 60 mL/hr at 05/11/17 1802  . Marland KitchenTPN (CLINIMIX-E) Adult     And  . fat emulsion    . levofloxacin (LEVAQUIN) IV 500 mg (05/11/17 1759)   EHU:[id number], hydrALAZINE, LORazepam, metoprolol tartrate, ondansetron **OR** ondansetron (ZOFRAN) IV  Assesment: She was admitted with sepsis  due to aspiration pneumonia.  She does not show signs of sepsis now.  She has COPD exacerbation and that is being treated with nebulizers and steroids  She had acute kidney injury which is improving  She has congestive heart failure but seems to be with good volume status now  She has altered mental status.  We could not prove she had a stroke on CT or MRI.  She has acute hypoxic respiratory failure and is now on 40% oxygen via nasal cannula with good oxygenation. Principal Problem:   Sepsis ([place]) Active Problems:   CHF (congestive heart failure) (HCC)   COPD (chronic obstructive pulmonary disease) (HCC)   CAD (coronary artery disease)   Essential hypertension   Stroke ([place])   Aspiration pneumonia (HCC)   AKI (acute kidney injury) ([place])    Plan: Continue current treatments    LOS: 9 days   Remedy Corporan L 05/12/2017, 9:20 AM

## 2017-05-12 NOTE — Progress Notes (Signed)
Decreasing atrovent to q6, mainly for sleep

## 2017-05-12 NOTE — Progress Notes (Signed)
[facility] PROGRESS NOTE  Mariah White:[id number] DOB: 09/26/1950 DOA: 05/03/2017 PCP: Kirstie Peri, MD  Interim summary and HPI 67 year old female with history of diastolic CHF, COPD, CAD, prior CVA and hypertension found at home on the floor by her son.  Patient was in her usual state the previous night.  EMS found her to have some facial droop.  In the ED patient was septic with fever of 101.5 F, hypotensive, hypoxic, tachycardic and elevated leukocytes with elevated lactic acid meeting criteria for sepsis.  Source of infection presumed to be pneumonia.  Assessment/Plan: 1-sepsis secondary to pneumonia with acute respiratory failure with hypoxemia: -With presumption for aspiration pneumonia.  But patient empirically improved with Levaquin antibiotic therapy. She will complete 10 days treatment today.  -Following  BCID and patient improvement, Aztreonam was discontinued on 2/6. -Continue supportive care and continue weaning oxygen supplementation as needed -Patient has not longer required BIPAP; but still using high flow oxygen supplementation. -appreciate assistance and recommendations from pulmonary service.  -CXR improved  2-COPD exacerbation -still with some wheezing and rhonchi on exam. -off BIPAP, but requiring high flow oxygen -continue nebulizer and steroids therapy -continue to wean oxygen as tolerated -complete antibiotic therapy today, start slow steroids tapering -continue flutter valve -repeated CXR demonstrating overall improvement; just mild pleural effusion and some congestion at bases.  3-chronic diastolic heart failure -No signs of fluid overload and condition has remained stable/compensated currently.  -Last echo in May 2018 with preserved ejection fraction at 75%. -slight trace edema and decrease BS at bases; also CXR suggesting pleural effusion -will give 2 dose of lasix  4-acute kidney injury -In the setting of poor perfusion with  sepsis. -continue judicious IVF's -follow renal function   5-HTN -continue PRN hydralazine -Patient has has been able to tolerate dysphagia 1 with nectar thick liquids -If no abnormalities with swallowing and initiation of medications, will resume her antihypertensive drugs in the next 24-40 hours -Vital signs has remained stable currently.  6-acute toxic metabolic encephalopathy -due to infection and hospital-acquired delirium. -Continue constant reorientation -Complete antibiotic therapy today -Will add low-dose of Seroquel to assist with hospital-acquired delirium.  7-leukocytosis -No further episodes of fever -WBCs trending down appropriately -Will complete antibiotic therapy today.    8-moderate protein calorie malnutrition -Continue TNA for now -speech to assist with evaluation to safely advance diet; patient has no being able to tolerate dysphagia 1 diet with nectar thick liquids.  Code Status: Full Family Communication: No family at bedside. Disposition Plan: Remains in stepdown.  Continue weaning oxygen supplementation as tolerated.  Will stop IV antibiotics.  Consultants:  Pulmonary service   Procedures:  See below for X-ray reports   Antibiotics:  Aztreonam 1/31>>2/6  Levaquin 1/31>>05/12/17  HPI/Subjective: No fever, no chest pain.  Still requiring high flow oxygen supplementation.  She with improvement in her mentation and reporting improvement in her breathing.  Objective: Vitals:   05/12/17 1446 05/12/17 1719  BP:    Pulse:    Resp:    Temp:  99 F (37.2 C)  SpO2: 100%     Intake/Output Summary (Last 24 hours) at 05/12/2017 1736 Last data filed at 05/12/2017 1104 Gross per 24 hour  Intake 762 ml  Output 1452 ml  Net -690 ml   Filed Weights   05/10/17 0500 05/11/17 0500 05/12/17 0400  Weight: 77.6 kg (171 lb 1.2 oz) 77.4 kg (170 lb 10.2 oz) 77.9 kg (171 lb 11.8 oz)    Exam:   General: Afebrile, less  somnolent and able to follow commands.   Patient oriented x2 intermittently.  No chest pain, no nausea, no vomiting.  Reports improvement in her breathing.  Still requiring high flow oxygen supplementation.  Cardiovascular: S1 and S2, no rubs, no gallops.  Respiratory: Scattered rhonchi, mild expiratory wheezing, no crackles.  Decreased breath sounds at the bases.  D  Abdomen: Soft, nontender, nondistended, positive bowel sounds.    Musculoskeletal: Trace edema bilaterally, no cyanosis, no clubbing.     Data Reviewed: Basic Metabolic Panel: Recent Labs  Lab 05/07/17 0429 05/09/17 0539 05/11/17 0545  NA 145 143 144  K 3.9 4.0 4.2  CL 101 102 106  CO2 29 27 28   GLUCOSE 147* 128* 231*  BUN 37* 44* 55*  CREATININE 1.11* 0.99 1.07*  CALCIUM 10.0 9.1 8.8*  MG  --   --  2.8*  PHOS  --   --  5.4*   Liver Function Tests: Recent Labs  Lab 05/11/17 0545  AST 39  ALT 53  ALKPHOS 55  BILITOT 0.8  PROT 5.8*  ALBUMIN 2.5*   CBC: Recent Labs  Lab 05/06/17 0419 05/07/17 0429 05/09/17 0539 05/11/17 0545  WBC 16.9* 16.9* 17.8* 11.7*  NEUTROABS  --   --   --  10.7*  HGB 11.9* 12.7 12.6 11.0*  HCT 37.5 40.4 39.7 35.3*  MCV 92.4 92.4 93.4 94.1  PLT 215 213 185 197   BNP (last 3 results) Recent Labs    05/03/17 1610  BNP 202.0*    Recent Results (from the past 240 hour(s))  Urine culture     Status: None   Collection Time: 05/03/17  3:54 PM  Result Value Ref Range Status   Specimen Description   Final    URINE, CLEAN CATCH Performed at [facility], [address], [place], [place] [address]    Special Requests   Final    NONE Performed at [facility], [address] [place], [place] [address]    Culture   Final    NO GROWTH Performed at [facility], [address], [place], [place] [address]    Report Status 05/05/2017 FINAL  Final  Blood Culture (routine x 2)     Status: None   Collection Time: 05/03/17  4:09 PM  Result Value Ref Range Status   Specimen Description RIGHT ANTECUBITAL  Final    Special Requests   Final    BOTTLES DRAWN AEROBIC AND ANAEROBIC Blood Culture adequate volume   Culture   Final    NO GROWTH 5 DAYS Performed at [facility], [address], [place], [place] [address]    Report Status 05/08/2017 FINAL  Final  Blood Culture (routine x 2)     Status: None   Collection Time: 05/03/17  4:10 PM  Result Value Ref Range Status   Specimen Description BLOOD LEFT HAND  Final   Special Requests   Final    BOTTLES DRAWN AEROBIC AND ANAEROBIC Blood Culture adequate volume   Culture   Final    NO GROWTH 5 DAYS Performed at [facility], [address] [place], [place] [address]    Report Status 05/08/2017 FINAL  Final  MRSA PCR Screening     Status: None   Collection Time: 05/03/17 10:15 PM  Result Value Ref Range Status   MRSA by PCR NEGATIVE NEGATIVE Final    Comment:        The GeneXpert MRSA Assay (FDA approved for NASAL specimens only), is one  component of a comprehensive MRSA colonization surveillance program. It is not intended to diagnose MRSA infection nor to guide or monitor treatment for MRSA infections.      Studies: Dg Chest Port 1 View  Result Date: 05/12/2017 CLINICAL DATA:  Shortness of breath. EXAM: PORTABLE CHEST 1 VIEW COMPARISON:  Chest x-ray dated May 05, 2017. FINDINGS: Interval placement of a right-sided PICC line with the tip in the distal SVC. Unchanged borderline cardiomegaly. Mild interstitial edema. Significantly improved diffuse bilateral airspace disease. No focal consolidation, pleural effusion, or pneumothorax. No acute osseous abnormality. IMPRESSION: 1. Significantly improved diffuse bilateral airspace disease, likely reflecting resolving infection or alveolar edema. Mild persistent interstitial pulmonary edema. Electronically Signed   By: Obie Dredge M.D.   On: 05/12/2017 07:52    Scheduled Meds: . budesonide (PULMICORT) nebulizer solution  0.5 mg Nebulization BID  . calcium-vitamin D  1 tablet Oral Daily  .  chlorhexidine  15 mL Mouth Rinse BID  . clopidogrel  75 mg Oral Daily  . enoxaparin (LOVENOX) injection  40 mg Subcutaneous Q24H  . furosemide  20 mg Intravenous Q12H  . insulin aspart  0-10 Units Subcutaneous Q6H  . ipratropium  0.5 mg Nebulization Q6H  . levalbuterol  0.63 mg Nebulization Q6H  . magnesium oxide  400 mg Oral Daily  . mouth rinse  15 mL Mouth Rinse q12n4p  . [START ON 05/13/2017] methylPREDNISolone (SOLU-MEDROL) injection  40 mg Intravenous Q12H  . pantoprazole (PROTONIX) IV  40 mg Intravenous Q24H  . QUEtiapine  25 mg Oral QHS   Continuous Infusions: . Marland KitchenTPN (CLINIMIX-E) Adult 60 mL/hr at 05/11/17 1802  . Marland KitchenTPN (CLINIMIX-E) Adult     And  . fat emulsion      Principal Problem:   Sepsis (HCC) Active Problems:   CHF (congestive heart failure) (HCC)   COPD (chronic obstructive pulmonary disease) (HCC)   CAD (coronary artery disease)   Essential hypertension   Stroke (HCC)   Aspiration pneumonia (HCC)   AKI (acute kidney injury) (HCC)    Time spent: 35 minutes    Vassie Loll  [facility] Pager [phone]. If 7PM-7AM, please contact night-coverage at [email or web address], password [facility] 05/12/2017, 5:36 PM  LOS: 9 days

## 2017-05-13 LAB — COMPREHENSIVE METABOLIC PANEL
ALT: 39 U/L (ref 14–54)
AST: 21 U/L (ref 15–41)
Albumin: 2.6 g/dL — ABNORMAL LOW (ref 3.5–5.0)
Alkaline Phosphatase: 54 U/L (ref 38–126)
Anion gap: 10 (ref 5–15)
BILIRUBIN TOTAL: 0.7 mg/dL (ref 0.3–1.2)
BUN: 47 mg/dL — ABNORMAL HIGH (ref 6–20)
CHLORIDE: 105 mmol/L (ref 101–111)
CO2: 22 mmol/L (ref 22–32)
Calcium: 8.5 mg/dL — ABNORMAL LOW (ref 8.9–10.3)
Creatinine, Ser: 0.89 mg/dL (ref 0.44–1.00)
Glucose, Bld: 263 mg/dL — ABNORMAL HIGH (ref 65–99)
POTASSIUM: 4.4 mmol/L (ref 3.5–5.1)
Sodium: 137 mmol/L (ref 135–145)
TOTAL PROTEIN: 5.6 g/dL — AB (ref 6.5–8.1)

## 2017-05-13 LAB — GLUCOSE, CAPILLARY
GLUCOSE-CAPILLARY: 216 mg/dL — AB (ref 65–99)
GLUCOSE-CAPILLARY: 183 mg/dL — AB (ref 65–99)
GLUCOSE-CAPILLARY: 180 mg/dL — AB (ref 65–99)
GLUCOSE-CAPILLARY: 119 mg/dL — AB (ref 65–99)

## 2017-05-13 MED ORDER — LEVALBUTEROL HCL 0.63 MG/3ML IN NEBU
0.6300 mg | INHALATION_SOLUTION | Freq: Four times a day (QID) | RESPIRATORY_TRACT | Status: DC
  Administered 2017-05-13 – 2017-05-17 (×13): 0.63 mg via RESPIRATORY_TRACT
  Filled 2017-05-13 (×14): qty 3

## 2017-05-13 MED ORDER — IPRATROPIUM BROMIDE 0.02 % IN SOLN
0.5000 mg | Freq: Four times a day (QID) | RESPIRATORY_TRACT | Status: DC
  Administered 2017-05-13 – 2017-05-17 (×13): 0.5 mg via RESPIRATORY_TRACT
  Filled 2017-05-13 (×14): qty 2.5

## 2017-05-13 MED ORDER — PANTOPRAZOLE SODIUM 40 MG PO TBEC
40.0000 mg | DELAYED_RELEASE_TABLET | Freq: Every day | ORAL | Status: DC
  Administered 2017-05-13 – 2017-05-17 (×5): 40 mg via ORAL
  Filled 2017-05-13 (×5): qty 1

## 2017-05-13 NOTE — Progress Notes (Signed)
Subjective: She says she feels much better.  She was admitted with sepsis secondary to probable aspiration pneumonia.  She has had acute hypoxic respiratory failure and did require BiPAP but started vomiting so she was switched to high flow nasal cannula.  Her oxygen requirement has diminished markedly.  Objective: Vital signs in last 24 hours: Temp:  [98 F (36.7 C)-99.1 F (37.3 C)] 99.1 F (37.3 C) (02/10 0808) Resp:  [17-29] 20 (02/10 0600) BP: (120-155)/(66-97) 141/75 (02/10 0600) SpO2:  [85 %-100 %] 96 % (02/10 0600) FiO2 (%):  [40 %] 40 % (02/09 0932) Weight:  [79.9 kg (176 lb 2.4 oz)] 79.9 kg (176 lb 2.4 oz) (02/10 0500) Weight change: 2 kg (4 lb 6.6 oz) Last BM Date: 05/13/16  Intake/Output from previous day: 02/09 0701 - 02/10 0700 In: 709.7 [P.O.:600; I.V.:109.7] Out: 600 [Urine:600]  PHYSICAL EXAM General appearance: alert and cooperative Resp: rhonchi bilaterally Cardio: regular rate and rhythm, S1, S2 normal, no murmur, click, rub or gallop GI: soft, non-tender; bowel sounds normal; no masses,  no organomegaly Extremities: extremities normal, atraumatic, no cyanosis or edema  Lab Results:  Results for orders placed or performed during the hospital encounter of 05/03/17 (from the past 48 hour(s))  Glucose, capillary     Status: Abnormal   Collection Time: 05/11/17 11:29 AM  Result Value Ref Range   Glucose-Capillary 129 (H) 65 - 99 mg/dL   Comment 1 Notify RN    Comment 2 Document in Chart   Glucose, capillary     Status: Abnormal   Collection Time: 05/11/17  3:58 PM  Result Value Ref Range   Glucose-Capillary 162 (H) 65 - 99 mg/dL   Comment 1 Notify RN    Comment 2 Document in Chart   Glucose, capillary     Status: Abnormal   Collection Time: 05/11/17  5:27 PM  Result Value Ref Range   Glucose-Capillary 160 (H) 65 - 99 mg/dL  Glucose, capillary     Status: Abnormal   Collection Time: 05/11/17  8:23 PM  Result Value Ref Range   Glucose-Capillary 185 (H)  65 - 99 mg/dL  Glucose, capillary     Status: Abnormal   Collection Time: 05/11/17 11:47 PM  Result Value Ref Range   Glucose-Capillary 129 (H) 65 - 99 mg/dL   Comment 1 Notify RN    Comment 2 Document in Chart   Glucose, capillary     Status: Abnormal   Collection Time: 05/12/17  4:32 AM  Result Value Ref Range   Glucose-Capillary 173 (H) 65 - 99 mg/dL   Comment 1 Notify RN    Comment 2 Document in Chart   Glucose, capillary     Status: Abnormal   Collection Time: 05/12/17  8:04 AM  Result Value Ref Range   Glucose-Capillary 229 (H) 65 - 99 mg/dL  Glucose, capillary     Status: Abnormal   Collection Time: 05/12/17 11:29 AM  Result Value Ref Range   Glucose-Capillary 193 (H) 65 - 99 mg/dL  Glucose, capillary     Status: Abnormal   Collection Time: 05/12/17  4:39 PM  Result Value Ref Range   Glucose-Capillary 226 (H) 65 - 99 mg/dL  Glucose, capillary     Status: Abnormal   Collection Time: 05/12/17 11:55 PM  Result Value Ref Range   Glucose-Capillary 231 (H) 65 - 99 mg/dL  Comprehensive metabolic panel     Status: Abnormal   Collection Time: 05/13/17  5:03 AM  Result Value Ref  Range   Sodium 137 135 - 145 mmol/L    Comment: DELTA CHECK NOTED   Potassium 4.4 3.5 - 5.1 mmol/L   Chloride 105 101 - 111 mmol/L   CO2 22 22 - 32 mmol/L   Glucose, Bld 263 (H) 65 - 99 mg/dL   BUN 47 (H) 6 - 20 mg/dL   Creatinine, Ser 0.89 0.44 - 1.00 mg/dL   Calcium 8.5 (L) 8.9 - 10.3 mg/dL   Total Protein 5.6 (L) 6.5 - 8.1 g/dL   Albumin 2.6 (L) 3.5 - 5.0 g/dL   AST 21 15 - 41 U/L   ALT 39 14 - 54 U/L   Alkaline Phosphatase 54 38 - 126 U/L   Total Bilirubin 0.7 0.3 - 1.2 mg/dL   GFR calc non Af Amer >60 >60 mL/min   GFR calc Af Amer >60 >60 mL/min    Comment: (NOTE) The eGFR has been calculated using the CKD EPI equation. This calculation has not been validated in all clinical situations. eGFR's persistently <60 mL/min signify possible Chronic Kidney Disease.    Anion gap 10 5 - 15     Comment: Performed at [facility], [address], [place], [place] [address]  Glucose, capillary     Status: Abnormal   Collection Time: 05/13/17  6:29 AM  Result Value Ref Range   Glucose-Capillary 216 (H) 65 - 99 mg/dL    ABGS No results for input(s): PHART, PO2ART, TCO2, HCO3 in the last 72 hours.  Invalid input(s): PCO2 CULTURES Recent Results (from the past 240 hour(s))  Urine culture     Status: None   Collection Time: 05/03/17  3:54 PM  Result Value Ref Range Status   Specimen Description   Final    URINE, CLEAN CATCH Performed at [facility], [address], [place], [place] [address]    Special Requests   Final    NONE Performed at [facility], [address], [place], [place] [address]    Culture   Final    NO GROWTH Performed at [facility], [place] [address] [place], [place] [address]    Report Status 05/05/2017 FINAL  Final  Blood Culture (routine x 2)     Status: None   Collection Time: 05/03/17  4:09 PM  Result Value Ref Range Status   Specimen Description RIGHT ANTECUBITAL  Final   Special Requests   Final    BOTTLES DRAWN AEROBIC AND ANAEROBIC Blood Culture adequate volume   Culture   Final    NO GROWTH 5 DAYS Performed at [facility], [address], [place], [place] [address]    Report Status 05/08/2017 FINAL  Final  Blood Culture (routine x 2)     Status: None   Collection Time: 05/03/17  4:10 PM  Result Value Ref Range Status   Specimen Description BLOOD LEFT HAND  Final   Special Requests   Final    BOTTLES DRAWN AEROBIC AND ANAEROBIC Blood Culture adequate volume   Culture   Final    NO GROWTH 5 DAYS Performed at [facility], [address] [place], [place] [address]    Report Status 05/08/2017 FINAL  Final  MRSA PCR Screening     Status: None   Collection Time: 05/03/17 10:15 PM  Result Value Ref Range Status   MRSA by PCR NEGATIVE NEGATIVE Final    Comment:        The GeneXpert MRSA Assay (FDA approved for NASAL  specimens only), is one  component of a comprehensive MRSA colonization surveillance program. It is not intended to diagnose MRSA infection nor to guide or monitor treatment for MRSA infections.    Studies/Results: Dg Chest Port 1 View  Result Date: 05/12/2017 CLINICAL DATA:  Shortness of breath. EXAM: PORTABLE CHEST 1 VIEW COMPARISON:  Chest x-ray dated May 05, 2017. FINDINGS: Interval placement of a right-sided PICC line with the tip in the distal SVC. Unchanged borderline cardiomegaly. Mild interstitial edema. Significantly improved diffuse bilateral airspace disease. No focal consolidation, pleural effusion, or pneumothorax. No acute osseous abnormality. IMPRESSION: 1. Significantly improved diffuse bilateral airspace disease, likely reflecting resolving infection or alveolar edema. Mild persistent interstitial pulmonary edema. Electronically Signed   By: Titus Dubin M.D.   On: 05/12/2017 07:52    Medications:  Prior to Admission:  Medications Prior to Admission  Medication Sig Dispense Refill Last Dose  . albuterol (PROVENTIL HFA;VENTOLIN HFA) 108 (90 BASE) MCG/ACT inhaler Inhale 2 puffs into the lungs 2 (two) times daily.   unknown  . ALPRAZolam (XANAX) 1 MG tablet Take 1 tablet (1 mg total) 2 (two) times daily by mouth. 60 tablet 2 05/02/2017 at Unknown time  . amLODipine (NORVASC) 10 MG tablet Take 10 mg by mouth daily.    05/02/2017 at Unknown time  . ARIPiprazole (ABILIFY) 5 MG tablet Take 1 tablet (5 mg total) at bedtime by mouth. 30 tablet 2 05/02/2017 at Unknown time  . clopidogrel (PLAVIX) 75 MG tablet Take 75 mg by mouth daily.   05/02/2017 at Unknown time  . FLUoxetine (PROZAC) 20 MG capsule Take 1 capsule (20 mg total) daily by mouth. 30 capsule 2 05/02/2017 at Unknown time  . Fluticasone-Salmeterol (ADVAIR) 250-50 MCG/DOSE AEPB Inhale 1 puff into the lungs 2 (two) times daily.   05/02/2017 at Unknown time  . furosemide (LASIX) 40 MG tablet TAKE 1 AND 1/2 TABLETS BY MOUTH  DAILY 135 tablet 2 05/02/2017 at Unknown time  . gabapentin (NEURONTIN) 600 MG tablet Take 600 mg 3 (three) times daily by mouth.  3 05/02/2017 at Unknown time  . lisinopril (PRINIVIL,ZESTRIL) 40 MG tablet Take 40 mg by mouth daily.   05/02/2017 at Unknown time  . metoprolol succinate (TOPROL-XL) 50 MG 24 hr tablet Take 25 mg by mouth daily. Take with or immediately following a meal.   05/02/2017 at Unknown time  . oxyCODONE-acetaminophen (PERCOCET/ROXICET) 5-325 MG tablet Take 1-2 tablets by mouth every 4 (four) hours as needed for severe pain.   05/02/2017 at Unknown time  . pantoprazole (PROTONIX) 40 MG tablet Take 40 mg by mouth daily.   05/02/2017 at Unknown time  . pravastatin (PRAVACHOL) 80 MG tablet Take 80 mg by mouth daily.   05/02/2017 at Unknown time  . prazosin (MINIPRESS) 2 MG capsule Take 1 capsule (2 mg total) at bedtime by mouth. 30 capsule 2 05/02/2017 at Unknown time  . Calcium Carb-Cholecalciferol (CALCIUM 600-D PO) Take 1 tablet by mouth daily.   unknown  . Magnesium Oxide 400 (240 Mg) MG TABS Take 1 tablet (400 mg total) by mouth daily. 30 tablet 6 unknown  . Omega-3 Fatty Acids (FISH OIL) 1000 MG CAPS Take by mouth 2 (two) times daily.   unknown   Scheduled: . budesonide (PULMICORT) nebulizer solution  0.5 mg Nebulization BID  . calcium-vitamin D  1 tablet Oral Daily  . chlorhexidine  15 mL Mouth Rinse BID  . clopidogrel  75 mg Oral Daily  . enoxaparin (LOVENOX) injection  40 mg Subcutaneous Q24H  . insulin  aspart  0-10 Units Subcutaneous Q6H  . ipratropium  0.5 mg Nebulization Q6H WA  . levalbuterol  0.63 mg Nebulization Q6H WA  . magnesium oxide  400 mg Oral Daily  . mouth rinse  15 mL Mouth Rinse q12n4p  . methylPREDNISolone (SOLU-MEDROL) injection  40 mg Intravenous Q12H  . pantoprazole (PROTONIX) IV  40 mg Intravenous Q24H  . QUEtiapine  25 mg Oral QHS   Continuous: . Marland KitchenTPN (CLINIMIX-E) Adult 70 mL/hr (05/12/17 1744)   GEX:[id number], hydrALAZINE, LORazepam,  metoprolol tartrate, ondansetron **OR** ondansetron (ZOFRAN) IV  Assesment: She was admitted with sepsis related to aspiration she finished 10 days of antibiotics yesterday.  She no longer has any signs or symptoms of sepsis.  Her pneumonia is improving.  She has COPD exacerbation and that is improving she has finished antibiotics and steroids are being tapered.  She is still on nebulizer treatments  She has acute hypoxic respiratory failure and had been on high flow nasal cannula and is still on high flow nasal cannula but her oxygen requirement has markedly reduced  She has had altered mental status and that is slowly improving  She has some trouble swallowing and speech therapy has helped with that  She has protein calorie malnutrition and she is receiving TNA. Principal Problem:   Sepsis ([place]) Active Problems:   CHF (congestive heart failure) (HCC)   COPD (chronic obstructive pulmonary disease) (HCC)   CAD (coronary artery disease)   Essential hypertension   Stroke (HCC)   Aspiration pneumonia (HCC)   AKI (acute kidney injury) ([place])    Plan: Continue current treatments.  I think she will likely require skilled care facility rehabilitation when she is discharged    LOS: 10 days   Jasmen Emrich L 05/13/2017, 9:13 AM

## 2017-05-13 NOTE — Progress Notes (Signed)
[facility] PROGRESS NOTE  Mariah White XKG:[id number] DOB: 03-Sep-1950 DOA: 05/03/2017 PCP: Kirstie Peri, MD  Interim summary and HPI 67 year old female with history of diastolic CHF, COPD, CAD, prior CVA and hypertension found at home on the floor by her son.  Patient was in her usual state the previous night.  EMS found her to have some facial droop.  In the ED patient was septic with fever of 101.5 F, hypotensive, hypoxic, tachycardic and elevated leukocytes with elevated lactic acid meeting criteria for sepsis.  Source of infection presumed to be pneumonia.  Assessment/Plan: 1-sepsis secondary to pneumonia with acute respiratory failure with hypoxemia: -With presumption for aspiration pneumonia.  But patient empirically improved with Levaquin antibiotic therapy. She completed 10 days treatment on 2/9.  -Following  BCID and patient improvement, Aztreonam was discontinued on 2/6. -Continue supportive care and continue weaning oxygen supplementation as needed -Patient has not longer required BIPAP; and finally off high flow oxygen supplementation  -appreciate assistance and recommendations from pulmonary service.  -follow clinical response   2-COPD exacerbation -with improved air movement today; no major wheezing or crackles on exam -has remained off BIPAP, and able to turn down to 4L [place] today -will continue nebulizer and steroids therapy, starting tapering on last one. -continue to wean oxygen as tolerated -completed 10 days of antibiotics on 2/9 -will continue flutter valve -will intermittently follow CXR  3-chronic diastolic heart failure -Foley in overall control and compensated. -Last echo in May 2018 with preserved ejection fraction at 75%. -Continue Lasix as previously ordered to maintained volume control -Follow daily weights and strict intake and output.  4-acute kidney injury -In the setting of poor perfusion with sepsis. -follow renal function  -Renal  function back to normal now; Cr 0.89  5-HTN -continue PRN hydralazine -Patient has has been able to tolerate dysphagia 1 with nectar thick liquids -Blood pressure good and stable. -Continue monitoring vital signs -Lasix ordered to control volume also helping blood pressure.  6-acute toxic metabolic encephalopathy -due to infection and hospital-acquired delirium. -Continue constant reorientation -Continue low-dose Seroquel. -Mentation much improved.  7-leukocytosis -No further fevers -Nontoxic in appearance -Monitor WBC strain of antibiotics.  8-moderate protein calorie malnutrition -speech to assist with evaluation to safely advance diet; patient has no being able to tolerate dysphagia 1 diet with nectar thick liquids. -Patient continue tolerating diet without problems, will discontinue TNA  Code Status: Full Family Communication: No family at bedside. Disposition Plan: Remains in stepdown for another 24 hours. Continue weaning oxygen supplementation as tolerated.  Start steroids tapering.  Get physical therapy evaluation and follow clinical response  Consultants:  Pulmonary service   Procedures:  See below for X-ray reports   Antibiotics:  Aztreonam 1/31>>2/6  Levaquin 1/31>>05/12/17  HPI/Subjective: No fever, no chest pain, no nausea, no vomiting.  Patient with a significant decrease in her oxygen requirement (now down to 4 L nasal cannula).  Improving mentation and with better insight.  Objective: Vitals:   05/13/17 0808 05/13/17 0915  BP:    Pulse:    Resp:    Temp: 99.1 F (37.3 C)   SpO2:  97%    Intake/Output Summary (Last 24 hours) at 05/13/2017 1039 Last data filed at 05/12/2017 1900 Gross per 24 hour  Intake 709.67 ml  Output 600 ml  Net 109.67 ml   Filed Weights   05/11/17 0500 05/12/17 0400 05/13/17 0500  Weight: 77.4 kg (170 lb 10.2 oz) 77.9 kg (171 lb 11.8 oz) 79.9 kg (176 lb 2.4  oz)    Exam:   General: No fever, less somnolent and  appropriately interactive.  Patient oriented x2 and with better insight.  Breathing easier and currently down to 4L [place].    Cardiovascular: S1 and S2, no rubs, no gallops.   Respiratory: Scattered rhonchi, no significant wheezing appreciated on exam, improved air movement.   Abdomen: Soft, nontender, nondistended, positive bowel sounds.  Musculoskeletal: Mild trace edema bilaterally, no cyanosis, no clubbing, no joint deformity appreciated.    Data Reviewed: Basic Metabolic Panel: Recent Labs  Lab 05/07/17 0429 05/09/17 0539 05/11/17 0545 05/13/17 0503  NA 145 143 144 137  K 3.9 4.0 4.2 4.4  CL 101 102 106 105  CO2 29 27 28 22   GLUCOSE 147* 128* 231* 263*  BUN 37* 44* 55* 47*  CREATININE 1.11* 0.99 1.07* 0.89  CALCIUM 10.0 9.1 8.8* 8.5*  MG  --   --  2.8*  --   PHOS  --   --  5.4*  --    Liver Function Tests: Recent Labs  Lab 05/11/17 0545 05/13/17 0503  AST 39 21  ALT 53 39  ALKPHOS 55 54  BILITOT 0.8 0.7  PROT 5.8* 5.6*  ALBUMIN 2.5* 2.6*   CBC: Recent Labs  Lab 05/07/17 0429 05/09/17 0539 05/11/17 0545  WBC 16.9* 17.8* 11.7*  NEUTROABS  --   --  10.7*  HGB 12.7 12.6 11.0*  HCT 40.4 39.7 35.3*  MCV 92.4 93.4 94.1  PLT 213 185 197   BNP (last 3 results) Recent Labs    05/03/17 1610  BNP 202.0*    Recent Results (from the past 240 hour(s))  Urine culture     Status: None   Collection Time: 05/03/17  3:54 PM  Result Value Ref Range Status   Specimen Description   Final    URINE, CLEAN CATCH Performed at [facility], [address], [place], [place] [address]    Special Requests   Final    NONE Performed at [facility], [address], [place], [place] [address]    Culture   Final    NO GROWTH Performed at [facility], [address], [place], [place] [address]    Report Status 05/05/2017 FINAL  Final  Blood Culture (routine x 2)     Status: None   Collection Time: 05/03/17  4:09 PM  Result Value Ref Range Status   Specimen  Description RIGHT ANTECUBITAL  Final   Special Requests   Final    BOTTLES DRAWN AEROBIC AND ANAEROBIC Blood Culture adequate volume   Culture   Final    NO GROWTH 5 DAYS Performed at [facility], [address], [place], [place] [address]    Report Status 05/08/2017 FINAL  Final  Blood Culture (routine x 2)     Status: None   Collection Time: 05/03/17  4:10 PM  Result Value Ref Range Status   Specimen Description BLOOD LEFT HAND  Final   Special Requests   Final    BOTTLES DRAWN AEROBIC AND ANAEROBIC Blood Culture adequate volume   Culture   Final    NO GROWTH 5 DAYS Performed at [facility], [address], [place], [place] [address]    Report Status 05/08/2017 FINAL  Final  MRSA PCR Screening     Status: None   Collection Time: 05/03/17 10:15 PM  Result Value Ref Range Status   MRSA by PCR NEGATIVE NEGATIVE Final    Comment:  The GeneXpert MRSA Assay (FDA approved for NASAL specimens only), is one component of a comprehensive MRSA colonization surveillance program. It is not intended to diagnose MRSA infection nor to guide or monitor treatment for MRSA infections.      Studies: Dg Chest Port 1 View  Result Date: 05/12/2017 CLINICAL DATA:  Shortness of breath. EXAM: PORTABLE CHEST 1 VIEW COMPARISON:  Chest x-ray dated May 05, 2017. FINDINGS: Interval placement of a right-sided PICC line with the tip in the distal SVC. Unchanged borderline cardiomegaly. Mild interstitial edema. Significantly improved diffuse bilateral airspace disease. No focal consolidation, pleural effusion, or pneumothorax. No acute osseous abnormality. IMPRESSION: 1. Significantly improved diffuse bilateral airspace disease, likely reflecting resolving infection or alveolar edema. Mild persistent interstitial pulmonary edema. Electronically Signed   By: Obie Dredge M.D.   On: 05/12/2017 07:52    Scheduled Meds: . budesonide (PULMICORT) nebulizer solution  0.5 mg Nebulization BID  .  calcium-vitamin D  1 tablet Oral Daily  . chlorhexidine  15 mL Mouth Rinse BID  . clopidogrel  75 mg Oral Daily  . enoxaparin (LOVENOX) injection  40 mg Subcutaneous Q24H  . insulin aspart  0-10 Units Subcutaneous Q6H  . ipratropium  0.5 mg Nebulization Q6H WA  . levalbuterol  0.63 mg Nebulization Q6H WA  . magnesium oxide  400 mg Oral Daily  . mouth rinse  15 mL Mouth Rinse q12n4p  . methylPREDNISolone (SOLU-MEDROL) injection  40 mg Intravenous Q12H  . pantoprazole (PROTONIX) IV  40 mg Intravenous Q24H  . QUEtiapine  25 mg Oral QHS    Principal Problem:   Sepsis (HCC) Active Problems:   CHF (congestive heart failure) (HCC)   COPD (chronic obstructive pulmonary disease) (HCC)   CAD (coronary artery disease)   Essential hypertension   Stroke (HCC)   Aspiration pneumonia (HCC)   AKI (acute kidney injury) (HCC)    Time spent: 30 minutes    Vassie Loll  [facility] Pager [phone]. If 7PM-7AM, please contact night-coverage at [email or web address], password [facility] 05/13/2017, 10:39 AM  LOS: 10 days

## 2017-05-14 ENCOUNTER — Inpatient Hospital Stay (HOSPITAL_COMMUNITY): Payer: Medicare Other

## 2017-05-14 LAB — GLUCOSE, CAPILLARY
Glucose-Capillary: 86 mg/dL (ref 65–99)
Glucose-Capillary: 145 mg/dL — ABNORMAL HIGH (ref 65–99)

## 2017-05-14 MED ORDER — STARCH (THICKENING) PO POWD
ORAL | Status: DC | PRN
  Filled 2017-05-14: qty 227

## 2017-05-14 MED ORDER — GABAPENTIN 300 MG PO CAPS
300.0000 mg | ORAL_CAPSULE | Freq: Three times a day (TID) | ORAL | Status: DC
  Administered 2017-05-14 – 2017-05-17 (×8): 300 mg via ORAL
  Filled 2017-05-14 (×8): qty 1

## 2017-05-14 MED ORDER — METOPROLOL SUCCINATE ER 25 MG PO TB24
12.5000 mg | ORAL_TABLET | Freq: Every day | ORAL | Status: DC
  Administered 2017-05-14 – 2017-05-17 (×4): 12.5 mg via ORAL
  Filled 2017-05-14 (×4): qty 1

## 2017-05-14 MED ORDER — GABAPENTIN 600 MG PO TABS
300.0000 mg | ORAL_TABLET | Freq: Three times a day (TID) | ORAL | Status: DC
  Filled 2017-05-14 (×5): qty 0.5

## 2017-05-14 MED ORDER — METHYLPREDNISOLONE SODIUM SUCC 40 MG IJ SOLR
40.0000 mg | Freq: Two times a day (BID) | INTRAMUSCULAR | Status: DC
  Administered 2017-05-14 – 2017-05-15 (×2): 40 mg via INTRAVENOUS
  Filled 2017-05-14 (×2): qty 1

## 2017-05-14 MED ORDER — LISINOPRIL 10 MG PO TABS
20.0000 mg | ORAL_TABLET | Freq: Every day | ORAL | Status: DC
  Administered 2017-05-14 – 2017-05-16 (×3): 20 mg via ORAL
  Filled 2017-05-14 (×4): qty 2

## 2017-05-14 MED ORDER — FUROSEMIDE 20 MG PO TABS
20.0000 mg | ORAL_TABLET | Freq: Every day | ORAL | Status: DC
  Administered 2017-05-15 – 2017-05-17 (×3): 20 mg via ORAL
  Filled 2017-05-14 (×3): qty 1

## 2017-05-14 NOTE — Clinical Social Work Note (Signed)
Clinical Social Work Assessment  Patient Details  Name: Mariah White MRN: [id number] Date of Birth: 1950-07-26  Date of referral:  05/14/17               Reason for consult:  Facility Placement                Permission sought to share information with:  Chartered certified accountant granted to share information::  Yes, Verbal Permission Granted  Name::        Agency::  [place] [place] [facility]  Relationship::     Contact Information:     Housing/Transportation Living arrangements for the past 2 months:  [place] of Information:  Patient Patient Interpreter Needed:  None Criminal Activity/Legal Involvement Pertinent to Current Situation/Hospitalization:  No - Comment as needed Significant Relationships:  Adult Children Lives with:  Adult Children Do you feel safe going back to the place where you live?  Yes Need for family participation in patient care:  No (Coment)  Care giving concerns: Pt needs SNF rehab per PT.   Social Worker assessment / plan: Pt is a 67 year old female referred to CSW for SNF rehab placement. Met with pt this AM to assess. Per pt, she lives with her son and his girlfriend in their home in [place]. Pt watches her grandchildren while parents work. Pt states she was independent in ADLs prior to this admission. She was not using O2 or any DME at home. Pt agrees with PT assessment that she needs short term rehab at [facility] upon hospital dc. Discussed options with pt who requested referrals to [facility] and [facility].   Will initiate referrals and follow up with pt to continue to assist with dc planning.  Employment status:  Retired Forensic scientist:  Medicaid [place], [employer] PT Recommendations:  [place] / Referral to community resources:     Patient/Family's Response to care: Pt accepting of care.  Patient/Family's Understanding of and Emotional Response to Diagnosis, Current Treatment,  and Prognosis: Pt appears to have a good understanding of diagnosis and treatment recommendations. No emotional distress identified.  Emotional Assessment Appearance:  Appears stated age Attitude/Demeanor/Rapport:  Engaged Affect (typically observed):  Accepting, Pleasant Orientation:  Oriented to Self, Oriented to Place, Oriented to  Time, Oriented to Situation Alcohol / Substance use:  Not Applicable Psych involvement (Current and /or in the community):  No (Comment)  Discharge Needs  Concerns to be addressed:  Discharge Planning Concerns Readmission within the last 30 days:  No Current discharge risk:  Physical Impairment Barriers to Discharge:  No Barriers Identified   Shade Flood, LCSW 05/14/2017, 11:26 AM

## 2017-05-14 NOTE — Plan of Care (Signed)
  Acute Rehab PT Goals(only PT should resolve) Pt Will Go Supine/Side To Sit 05/14/2017 1111 - Progressing by Ocie Bob, PT Flowsheets Taken 05/14/2017 1111  Pt will go Supine/Side to Sit with min guard assist Patient Will Transfer Sit To/From Stand 05/14/2017 1111 - Progressing by Ocie Bob, PT Flowsheets Taken 05/14/2017 1111  Patient will transfer sit to/from stand with minimal assist Pt Will Transfer Bed To Chair/Chair To Bed 05/14/2017 1111 - Progressing by Ocie Bob, PT Flowsheets Taken 05/14/2017 1111  Pt will Transfer Bed to Chair/Chair to Bed with min assist Pt Will Ambulate 05/14/2017 1111 - Progressing by Ocie Bob, PT Flowsheets Taken 05/14/2017 1111  Pt will Ambulate 25 feet;with rolling walker;with minimal assist  11:12 AM, 05/14/17 Ocie Bob, MPT Physical Therapist with [facility] [phone] office [phone] mobile phone

## 2017-05-14 NOTE — [place] FL2 (Signed)
[place] MEDICAID FL2 LEVEL OF CARE SCREENING TOOL     IDENTIFICATION  Patient Name: Mariah White Birthdate: 1951/01/26 Sex: female Admission Date (Current Location): 05/03/2017  [place] and [place] Number:  Aaron Edelman [id number] Facility and Address:  [facility],  [address] [place] [address]      Provider Number: [id number]  Attending Physician Name and Address:  Vassie Loll, MD  Relative Name and Phone Number:  Elta Guadeloupe (son) [phone]    Current Level of Care: Hospital Recommended Level of Care: Skilled Nursing Facility Prior Approval Number:    Date Approved/Denied:   PASRR Number: pending  Discharge Plan: SNF    Current Diagnoses: Patient Active Problem List   Diagnosis Date Noted  . CHF (congestive heart failure) (HCC) 05/03/2017  . COPD (chronic obstructive pulmonary disease) (HCC) 05/03/2017  . CAD (coronary artery disease) 05/03/2017  . Essential hypertension 05/03/2017  . Stroke (HCC) 05/03/2017  . Aspiration pneumonia (HCC) 05/03/2017  . Sepsis (HCC) 05/03/2017  . AKI (acute kidney injury) (HCC) 05/03/2017  . Major depression 07/01/2014    Orientation RESPIRATION BLADDER Height & Weight     Self, Time, Situation, Place  O2 Continent Weight: 176 lb 2.4 oz (79.9 kg) Height:  5\' 2"  (157.5 cm)  BEHAVIORAL SYMPTOMS/MOOD NEUROLOGICAL BOWEL NUTRITION STATUS      Continent Diet(Dysphagia 1, nectar thick liquids, meds crushed in puree)  AMBULATORY STATUS COMMUNICATION OF NEEDS Skin   Extensive Assist Verbally Normal                       Personal Care Assistance Level of Assistance  Bathing, Feeding, Dressing Bathing Assistance: Maximum assistance Feeding assistance: Limited assistance Dressing Assistance: Maximum assistance     Functional Limitations Info  Sight, Hearing, Speech Sight Info: Adequate Hearing Info: Adequate Speech Info: Adequate    SPECIAL CARE FACTORS FREQUENCY  PT (By licensed PT)     PT  Frequency: 5 times week              Contractures Contractures Info: Not present    Additional Factors Info  Code Status, Allergies, Psychotropic Code Status Info: full Allergies Info: penicillins Psychotropic Info: xanax, abilify, prozac         Current Medications (05/14/2017):  This is the current hospital active medication list Current Facility-Administered Medications  Medication Dose Route Frequency Provider Last Rate Last Dose  . acetaminophen (TYLENOL) tablet 650 mg  650 mg Oral Q6H PRN Vassie Loll, MD      . budesonide (PULMICORT) nebulizer solution 0.5 mg  0.5 mg Nebulization BID Vassie Loll, MD   0.5 mg at 05/14/17 0347  . calcium-vitamin D (OSCAL WITH D) 500-200 MG-UNIT per tablet 1 tablet  1 tablet Oral Daily Sherryll Burger, Pratik D, DO   1 tablet at 05/14/17 1140  . chlorhexidine (PERIDEX) 0.12 % solution 15 mL  15 mL Mouth Rinse BID Dhungel, Nishant, MD   15 mL at 05/13/17 2100  . clopidogrel (PLAVIX) tablet 75 mg  75 mg Oral Daily Sherryll Burger, Pratik D, DO   75 mg at 05/14/17 1139  . enoxaparin (LOVENOX) injection 40 mg  40 mg Subcutaneous Q24H Dhungel, Nishant, MD   40 mg at 05/13/17 2100  . hydrALAZINE (APRESOLINE) injection 10 mg  10 mg Intravenous Q6H PRN Dhungel, Nishant, MD      . insulin aspart (novoLOG) injection 0-10 Units  0-10 Units Subcutaneous Q6H Vassie Loll, MD   2 Units at 05/13/17 1740  . ipratropium (  ATROVENT) nebulizer solution 0.5 mg  0.5 mg Nebulization Q6H WA Vassie Loll, MD   0.5 mg at 05/14/17 0847  . levalbuterol (XOPENEX) nebulizer solution 0.63 mg  0.63 mg Nebulization Q6H WA Vassie Loll, MD   0.63 mg at 05/14/17 0847  . LORazepam (ATIVAN) injection 0.5 mg  0.5 mg Intravenous Q8H PRN Vassie Loll, MD   0.5 mg at 05/11/17 0009  . magnesium oxide (MAG-OX) tablet 400 mg  400 mg Oral Daily Sherryll Burger, Pratik D, DO   400 mg at 05/14/17 1138  . MEDLINE mouth rinse  15 mL Mouth Rinse q12n4p Dhungel, Nishant, MD   15 mL at 05/13/17 1721  .  methylPREDNISolone sodium succinate (SOLU-MEDROL) 40 mg/mL injection 40 mg  40 mg Intravenous Q12H Vassie Loll, MD   40 mg at 05/14/17 1139  . metoprolol tartrate (LOPRESSOR) injection 5 mg  5 mg Intravenous Q4H PRN Dhungel, Nishant, MD      . ondansetron (ZOFRAN) tablet 4 mg  4 mg Oral Q6H PRN Sherryll Burger, Pratik D, DO   4 mg at 05/07/17 2213   Or  . ondansetron (ZOFRAN) injection 4 mg  4 mg Intravenous Q6H PRN Sherryll Burger, Pratik D, DO   4 mg at 05/08/17 1340  . pantoprazole (PROTONIX) EC tablet 40 mg  40 mg Oral Daily Vassie Loll, MD   40 mg at 05/14/17 1139  . QUEtiapine (SEROQUEL) tablet 25 mg  25 mg Oral QHS Vassie Loll, MD   25 mg at 05/13/17 2100     Discharge Medications: Please see discharge summary for a list of discharge medications.  Relevant Imaging Results:  Relevant Lab Results:   Additional Information SSN: [id number], LCSW

## 2017-05-14 NOTE — Progress Notes (Signed)
Subjective: She says she feels well.  She is sleepy but I woke her up.  No complaints of shortness of breath.  She did not require BiPAP last night and she is on 4 White nasal cannula  Objective: Vital signs in last 24 hours: Temp:  [98.5 F (36.9 C)-99.1 F (37.3 C)] 98.8 F (37.1 C) (02/11 0400) Resp:  [18-22] 21 (02/10 2000) BP: (120-146)/(62-74) 132/71 (02/10 2000) SpO2:  [93 %-97 %] 97 % (02/10 2143) Weight:  [79.9 kg (176 lb 2.4 oz)] 79.9 kg (176 lb 2.4 oz) (02/11 0500) Weight change: 0 kg (0 lb) Last BM Date: 05/13/17  Intake/Output from previous day: 02/10 0701 - 02/11 0700 In: 240 [P.O.:240] Out: 750 [Urine:750]  PHYSICAL EXAM General appearance: alert and no distress Resp: clear to auscultation bilaterally Cardio: regular rate and rhythm, S1, S2 normal, no murmur, click, rub or gallop GI: soft, non-tender; bowel sounds normal; no masses,  no organomegaly Extremities: extremities normal, atraumatic, no cyanosis or edema Still mildly confused  Lab Results:  Results for orders placed or performed during the hospital encounter of 05/03/17 (from the past 48 hour(s))  Glucose, capillary     Status: Abnormal   Collection Time: 05/12/17  8:04 AM  Result Value Ref Range   Glucose-Capillary 229 (H) 65 - 99 mg/dL  Glucose, capillary     Status: Abnormal   Collection Time: 05/12/17 11:29 AM  Result Value Ref Range   Glucose-Capillary 193 (H) 65 - 99 mg/dL  Glucose, capillary     Status: Abnormal   Collection Time: 05/12/17  4:39 PM  Result Value Ref Range   Glucose-Capillary 226 (H) 65 - 99 mg/dL  Glucose, capillary     Status: Abnormal   Collection Time: 05/12/17 11:55 PM  Result Value Ref Range   Glucose-Capillary 231 (H) 65 - 99 mg/dL  Comprehensive metabolic panel     Status: Abnormal   Collection Time: 05/13/17  5:03 AM  Result Value Ref Range   Sodium 137 135 - 145 mmol/White    Comment: DELTA CHECK NOTED   Potassium 4.4 3.5 - 5.1 mmol/White   Chloride 105 101 - 111  mmol/White   CO2 22 22 - 32 mmol/White   Glucose, Bld 263 (H) 65 - 99 mg/dL   BUN 47 (H) 6 - 20 mg/dL   Creatinine, Ser 0.89 0.44 - 1.00 mg/dL   Calcium 8.5 (White) 8.9 - 10.3 mg/dL   Total Protein 5.6 (White) 6.5 - 8.1 g/dL   Albumin 2.6 (White) 3.5 - 5.0 g/dL   AST 21 15 - 41 U/White   ALT 39 14 - 54 U/White   Alkaline Phosphatase 54 38 - 126 U/White   Total Bilirubin 0.7 0.3 - 1.2 mg/dL   GFR calc non Af Amer >60 >60 mL/min   GFR calc Af Amer >60 >60 mL/min    Comment: (NOTE) The eGFR has been calculated using the CKD EPI equation. This calculation has not been validated in all clinical situations. eGFR's persistently <60 mL/min signify possible Chronic Kidney Disease.    Anion gap 10 5 - 15    Comment: Performed at [facility], [address], [place], [place] [address]  Glucose, capillary     Status: Abnormal   Collection Time: 05/13/17  6:29 AM  Result Value Ref Range   Glucose-Capillary 216 (H) 65 - 99 mg/dL  Glucose, capillary     Status: Abnormal   Collection Time: 05/13/17 11:56 AM  Result Value Ref Range  Glucose-Capillary 183 (H) 65 - 99 mg/dL  Glucose, capillary     Status: Abnormal   Collection Time: 05/13/17  4:41 PM  Result Value Ref Range   Glucose-Capillary 180 (H) 65 - 99 mg/dL  Glucose, capillary     Status: Abnormal   Collection Time: 05/13/17 11:16 PM  Result Value Ref Range   Glucose-Capillary 119 (H) 65 - 99 mg/dL    ABGS No results for input(s): PHART, PO2ART, TCO2, HCO3 in the last 72 hours.  Invalid input(s): PCO2 CULTURES No results found for this or any previous visit (from the past 240 hour(s)). Studies/Results: Dg Chest Port 1 View  Result Date: 05/12/2017 CLINICAL DATA:  Shortness of breath. EXAM: PORTABLE CHEST 1 VIEW COMPARISON:  Chest x-ray dated May 05, 2017. FINDINGS: Interval placement of a right-sided PICC line with the tip in the distal SVC. Unchanged borderline cardiomegaly. Mild interstitial edema. Significantly improved diffuse bilateral airspace  disease. No focal consolidation, pleural effusion, or pneumothorax. No acute osseous abnormality. IMPRESSION: 1. Significantly improved diffuse bilateral airspace disease, likely reflecting resolving infection or alveolar edema. Mild persistent interstitial pulmonary edema. Electronically Signed   By: Titus Dubin M.D.   On: 05/12/2017 07:52    Medications:  Prior to Admission:  Medications Prior to Admission  Medication Sig Dispense Refill Last Dose  . albuterol (PROVENTIL HFA;VENTOLIN HFA) 108 (90 BASE) MCG/ACT inhaler Inhale 2 puffs into the lungs 2 (two) times daily.   unknown  . ALPRAZolam (XANAX) 1 MG tablet Take 1 tablet (1 mg total) 2 (two) times daily by mouth. 60 tablet 2 05/02/2017 at Unknown time  . amLODipine (NORVASC) 10 MG tablet Take 10 mg by mouth daily.    05/02/2017 at Unknown time  . ARIPiprazole (ABILIFY) 5 MG tablet Take 1 tablet (5 mg total) at bedtime by mouth. 30 tablet 2 05/02/2017 at Unknown time  . clopidogrel (PLAVIX) 75 MG tablet Take 75 mg by mouth daily.   05/02/2017 at Unknown time  . FLUoxetine (PROZAC) 20 MG capsule Take 1 capsule (20 mg total) daily by mouth. 30 capsule 2 05/02/2017 at Unknown time  . Fluticasone-Salmeterol (ADVAIR) 250-50 MCG/DOSE AEPB Inhale 1 puff into the lungs 2 (two) times daily.   05/02/2017 at Unknown time  . furosemide (LASIX) 40 MG tablet TAKE 1 AND 1/2 TABLETS BY MOUTH DAILY 135 tablet 2 05/02/2017 at Unknown time  . gabapentin (NEURONTIN) 600 MG tablet Take 600 mg 3 (three) times daily by mouth.  3 05/02/2017 at Unknown time  . lisinopril (PRINIVIL,ZESTRIL) 40 MG tablet Take 40 mg by mouth daily.   05/02/2017 at Unknown time  . metoprolol succinate (TOPROL-XL) 50 MG 24 hr tablet Take 25 mg by mouth daily. Take with or immediately following a meal.   05/02/2017 at Unknown time  . oxyCODONE-acetaminophen (PERCOCET/ROXICET) 5-325 MG tablet Take 1-2 tablets by mouth every 4 (four) hours as needed for severe pain.   05/02/2017 at Unknown time  .  pantoprazole (PROTONIX) 40 MG tablet Take 40 mg by mouth daily.   05/02/2017 at Unknown time  . pravastatin (PRAVACHOL) 80 MG tablet Take 80 mg by mouth daily.   05/02/2017 at Unknown time  . prazosin (MINIPRESS) 2 MG capsule Take 1 capsule (2 mg total) at bedtime by mouth. 30 capsule 2 05/02/2017 at Unknown time  . Calcium Carb-Cholecalciferol (CALCIUM 600-D PO) Take 1 tablet by mouth daily.   unknown  . Magnesium Oxide 400 (240 Mg) MG TABS Take 1 tablet (400 mg total) by mouth  daily. 30 tablet 6 unknown  . Omega-3 Fatty Acids (FISH OIL) 1000 MG CAPS Take by mouth 2 (two) times daily.   unknown   Scheduled: . budesonide (PULMICORT) nebulizer solution  0.5 mg Nebulization BID  . calcium-vitamin D  1 tablet Oral Daily  . chlorhexidine  15 mL Mouth Rinse BID  . clopidogrel  75 mg Oral Daily  . enoxaparin (LOVENOX) injection  40 mg Subcutaneous Q24H  . insulin aspart  0-10 Units Subcutaneous Q6H  . ipratropium  0.5 mg Nebulization Q6H WA  . levalbuterol  0.63 mg Nebulization Q6H WA  . magnesium oxide  400 mg Oral Daily  . mouth rinse  15 mL Mouth Rinse q12n4p  . methylPREDNISolone (SOLU-MEDROL) injection  40 mg Intravenous Q12H  . pantoprazole  40 mg Oral Daily  . QUEtiapine  25 mg Oral QHS   Continuous:  NOM:[id number], hydrALAZINE, LORazepam, metoprolol tartrate, ondansetron **OR** ondansetron (ZOFRAN) IV  Assesment: She was admitted with sepsis presumably from aspiration pneumonia.  She is much improved and has finished antibiotics.  She is able to tolerate dysphagia 1 diet with nectar thick liquids with no overt episodes of aspiration  She had acute hypoxic respiratory failure and required BiPAP earlier during her hospitalization.  This was discontinued when she started having vomiting.  She was then placed on high flow nasal cannula and she is down now to 4 White nasal cannula with good oxygenation.  She has COPD at baseline and that is being treated.  Steroids are being tapered.  She  has chronic diastolic heart failure and that seems to be well controlled  She had acute kidney injury and her creatinine is back to baseline  She had toxic metabolic encephalopathy and she still has confusion and requires reorientation frequently Principal Problem:   Sepsis ([place]) Active Problems:   CHF (congestive heart failure) (HCC)   COPD (chronic obstructive pulmonary disease) (HCC)   CAD (coronary artery disease)   Essential hypertension   Stroke (HCC)   Aspiration pneumonia (HCC)   AKI (acute kidney injury) ([place])    Plan: Considering her improvement I will follow more peripherally.  Thanks for allowing me to see her with you    LOS: 11 days   Mariah White 05/14/2017, 7:09 AM

## 2017-05-14 NOTE — Evaluation (Signed)
Physical Therapy Evaluation Patient Details Name: Mariah White MRN: [id number] DOB: April 18, 1950 Today's Date: 05/14/2017   History of Present Illness  Mariah White is a 67 y.o. female with medical history, per documentation, significant for diastolic CHF, COPD, CAD, hypertension, and prior CVA who was found down at home at approximately 1545 by her son who lives with her.  He called EMS and stated at that time that he last saw her at her usual baseline the previous night at 2200.  EMS apparently noted that the patient would turn her head to look at the staff and was noted to have some right facial droop and left arm weakness.  She was also noted to have some low O2 saturations, but otherwise felt warm.    Clinical Impression  Patient limited to a few side steps at bedside and high risk for falls due to BLE weakness, SOB, and c/o fatgiue while on 3 LPM O2, (desaturated to 89 % while on room air).  Patient tolerated sitting up in chair after therapy - RN aware.  Patient will benefit from continued physical therapy in hospital and recommended venue below to increase strength, balance, endurance for safe ADLs and gait.    Follow Up Recommendations SNF    Equipment Recommendations  None recommended by PT    Recommendations for Other Services       Precautions / Restrictions Precautions Precautions: Fall Restrictions Weight Bearing Restrictions: No      Mobility  Bed Mobility Overal bed mobility: Needs Assistance Bed Mobility: Supine to Sit     Supine to sit: Mod assist        Transfers Overall transfer level: Needs assistance Equipment used: Rolling walker (2 wheeled) Transfers: Sit to/from Stand Sit to Stand: Mod assist;Max assist Stand pivot transfers: Mod assist;Max assist          Ambulation/Gait Ambulation/Gait assistance: Max assist Ambulation Distance (Feet): 3 Feet Assistive device: Rolling walker (2 wheeled) Gait Pattern/deviations: Decreased step length -  right;Decreased step length - left;Decreased stride length   Gait velocity interpretation: Below normal speed for age/gender General Gait Details: Patient limited to 3-4 unsteady steps due to BLE weakness and fatigue  Stairs            Wheelchair Mobility    Modified Rankin (Stroke Patients Only)       Balance Overall balance assessment: Needs assistance Sitting-balance support: Feet supported;No upper extremity supported Sitting balance-Leahy Scale: Fair     Standing balance support: Bilateral upper extremity supported;During functional activity Standing balance-Leahy Scale: Poor Standing balance comment: with RW                             Pertinent Vitals/Pain Pain Assessment: No/denies pain    Home Living Family/patient expects to be discharged to:: Private residence Living Arrangements: Children(son) Available Help at Discharge: Family Type of Home: House Home Access: Level entry     Home Layout: Multi-level Home Equipment: Gilmer Mor - single point;Walker - standard;Shower seat;Bedside commode      Prior Function Level of Independence: Independent with assistive device(s)         Comments: ambulates with SPC PRN     Hand Dominance        Extremity/Trunk Assessment   Upper Extremity Assessment Upper Extremity Assessment: Generalized weakness    Lower Extremity Assessment Lower Extremity Assessment: Generalized weakness    Cervical / Trunk Assessment Cervical / Trunk Assessment: Normal  Communication  Communication: No difficulties  Cognition Arousal/Alertness: Awake/alert Behavior During Therapy: WFL for tasks assessed/performed Overall Cognitive Status: Within Functional Limits for tasks assessed                                        General Comments      Exercises     Assessment/Plan    PT Assessment Patient needs continued PT services  PT Problem List Decreased strength;Decreased activity  tolerance;Decreased balance;Decreased mobility       PT Treatment Interventions Functional mobility training;Therapeutic activities;Therapeutic exercise;Patient/family education;Gait training    PT Goals (Current goals can be found in the Care Plan section)  Acute Rehab PT Goals Patient Stated Goal: return home after rehab PT Goal Formulation: With patient Time For Goal Achievement: 05/28/17 Potential to Achieve Goals: Good    Frequency Min 3X/week   Barriers to discharge        Co-evaluation               AM-PAC PT "6 Clicks" Daily Activity  Outcome Measure Difficulty turning over in bed (including adjusting bedclothes, sheets and blankets)?: None Difficulty moving from lying on back to sitting on the side of the bed? : A Lot Difficulty sitting down on and standing up from a chair with arms (e.g., wheelchair, bedside commode, etc,.)?: A Lot Help needed moving to and from a bed to chair (including a wheelchair)?: A Lot Help needed walking in hospital room?: A Lot Help needed climbing 3-5 steps with a railing? : Total 6 Click Score: 13    End of Session   Activity Tolerance: Patient limited by fatigue Patient left: in chair;with call bell/phone within reach Nurse Communication: Mobility status PT Visit Diagnosis: Unsteadiness on feet (R26.81);Other abnormalities of gait and mobility (R26.89);Muscle weakness (generalized) (M62.81)    Time: 1610-9604 PT Time Calculation (min) (ACUTE ONLY): 35 min   Charges:   PT Evaluation $PT Eval Moderate Complexity: 1 Mod PT Treatments $Therapeutic Activity: 23-37 mins   PT G Codes:        11:09 AM, 06/11/2017 Ocie Bob, MPT Physical Therapist with [facility] [phone] office [phone] mobile phone

## 2017-05-14 NOTE — Progress Notes (Signed)
[facility] PROGRESS NOTE  Mariah White ZOX:[id number] DOB: [birth year] DOA: 05/03/2017 PCP: Kirstie Peri, MD  Interim summary and HPI 67 year old female with history of diastolic CHF, COPD, CAD, prior CVA and hypertension found at home on the floor by her son.  Patient was in her usual state the previous night.  EMS found her to have some facial droop.  In the ED patient was septic with fever of 101.5 F, hypotensive, hypoxic, tachycardic and elevated leukocytes with elevated lactic acid meeting criteria for sepsis.  Source of infection presumed to be pneumonia.  Assessment/Plan: 1-sepsis secondary to pneumonia with acute respiratory failure with hypoxemia: -With presumption for aspiration pneumonia.  But patient empirically improved with Levaquin antibiotic therapy. She completed 10 days treatment on 2/9.  -Following  BCID and patient improvement, Aztreonam was discontinued on 2/6. -Continue supportive care and continue weaning oxygen supplementation as tolerated -Patient has not longer required BIPAP; and finally off high flow oxygen supplementation. -appreciate assistance and recommendations from pulmonary service.  -follow clinical response  -Okay to move to MedSurg bed. -Increase activity and evaluation by physical therapy.  2-COPD exacerbation -with improved air movement today; no major wheezing or crackles on exam -has remained off BIPAP, and able to turn down to 3L  today -will continue nebulizer and steroids therapy, starting tapering on last one. -continue to wean oxygen as tolerated -completed 10 days of antibiotics on 2/9 -will continue flutter valve -will intermittently follow CXR -Starting also maintenance inhaler therapy as per home regimen.  3-chronic diastolic heart failure -overall controlled and compensated. -Last echo in May 2018 with preserved ejection fraction at 75%. -Resume home dose of Lasix -Follow daily weights and strict intake and  output.  4-acute kidney injury -In the setting of poor perfusion with sepsis. -Renal function back to normal now -Will monitor renal function trend intermittently.  5-HTN -continue PRN hydralazine -Patient has has been able to tolerate dysphagia 1 with nectar thick liquids -Blood pressure overall stable. -Will start resuming home antihypertensive regimen at half of the dosages.  6-acute toxic metabolic encephalopathy -due to infection and hospital-acquired delirium. -Continue constant reorientation -Continue low-dose Seroquel. -Mentation much improved.  7-leukocytosis -No further fevers -Nontoxic in appearance -Monitor WBC strain of antibiotics.  8-moderate protein calorie malnutrition -speech to assist with evaluation to safely advance diet; patient is now able to tolerate dysphagia 1 diet with nectar thick liquids. -Patient continue tolerating diet without problems -Will follow further evaluation and diet advancement as recommended by speech therapy. -TNA discontinued on 2/10  Code Status: Full Family Communication: No family at bedside. Disposition Plan: Remains inpatient; but will move to med-surg bed. Continue weaning oxygen supplementation as tolerated. Continue steroids tapering; start home meds. Follow PT evaluation and rec's. Most likely in need of SNF at discharge fo rehabilitation.  Consultants:  Pulmonary service   Procedures:  See below for X-ray reports   Antibiotics:  Aztreonam 1/31>>2/6  Levaquin 1/31>>05/12/17  HPI/Subjective: Continue improving, no chest pain, no fever, no nausea, no vomiting.  Alert, awake and oriented x2.  Overall with improved insight.  Requiring 3 L nasal cannula oxygen supplementation and no longer on BiPAP.  Objective: Vitals:   05/14/17 0847 05/14/17 0852  BP:    Pulse:    Resp:    Temp:    SpO2: 96% 100%    Intake/Output Summary (Last 24 hours) at 05/14/2017 0940 Last data filed at 05/13/2017 1700 Gross per 24 hour   Intake -  Output 750 ml  Net -750  ml   Filed Weights   05/12/17 0400 05/13/17 0500 05/14/17 0500  Weight: 77.9 kg (171 lb 11.8 oz) 79.9 kg (176 lb 2.4 oz) 79.9 kg (176 lb 2.4 oz)    Exam:   General: Afebrile, oriented x2, with improved insight.  Denies chest pain, no nausea, no vomiting, no abdominal pain.  Tolerating dysphagia 1 with thin liquids.  Cardiovascular: S1 and S2, no rubs, no gallops.   Respiratory: Improved air movement bilaterally, no using accessory muscles, slight expiratory wheezing on exam.  No crackles.    Abdomen: Soft, nontender, nondistended, positive bowel sounds.  Musculoskeletal: No edema appreciated on exam, no cyanosis or clubbing.    Data Reviewed: Basic Metabolic Panel: Recent Labs  Lab 05/09/17 0539 05/11/17 0545 05/13/17 0503  NA 143 144 137  K 4.0 4.2 4.4  CL 102 106 105  CO2 27 28 22   GLUCOSE 128* 231* 263*  BUN 44* 55* 47*  CREATININE 0.99 1.07* 0.89  CALCIUM 9.1 8.8* 8.5*  MG  --  2.8*  --   PHOS  --  5.4*  --    Liver Function Tests: Recent Labs  Lab 05/11/17 0545 05/13/17 0503  AST 39 21  ALT 53 39  ALKPHOS 55 54  BILITOT 0.8 0.7  PROT 5.8* 5.6*  ALBUMIN 2.5* 2.6*   CBC: Recent Labs  Lab 05/09/17 0539 05/11/17 0545  WBC 17.8* 11.7*  NEUTROABS  --  10.7*  HGB 12.6 11.0*  HCT 39.7 35.3*  MCV 93.4 94.1  PLT 185 197   BNP (last 3 results) Recent Labs    05/03/17 1610  BNP 202.0*   Studies: No results found.  Scheduled Meds: . budesonide (PULMICORT) nebulizer solution  0.5 mg Nebulization BID  . calcium-vitamin D  1 tablet Oral Daily  . chlorhexidine  15 mL Mouth Rinse BID  . clopidogrel  75 mg Oral Daily  . enoxaparin (LOVENOX) injection  40 mg Subcutaneous Q24H  . insulin aspart  0-10 Units Subcutaneous Q6H  . ipratropium  0.5 mg Nebulization Q6H WA  . levalbuterol  0.63 mg Nebulization Q6H WA  . magnesium oxide  400 mg Oral Daily  . mouth rinse  15 mL Mouth Rinse q12n4p  . methylPREDNISolone  (SOLU-MEDROL) injection  40 mg Intravenous Q12H  . pantoprazole  40 mg Oral Daily  . QUEtiapine  25 mg Oral QHS    Principal Problem:   Sepsis (HCC) Active Problems:   CHF (congestive heart failure) (HCC)   COPD (chronic obstructive pulmonary disease) (HCC)   CAD (coronary artery disease)   Essential hypertension   Stroke (HCC)   Aspiration pneumonia (HCC)   AKI (acute kidney injury) (HCC)    Time spent: 30 minutes    Vassie Loll  [facility] Pager [phone]. If 7PM-7AM, please contact night-coverage at [email or web address], password [facility] 05/14/2017, 9:40 AM  LOS: 11 days

## 2017-05-14 NOTE — Progress Notes (Signed)
Nutrition Brief Note  Consult received for assessment of patient nutrition status. Please refer to RD assessment on 05/11/17 for details.   Patient has been seen today by SLP and diet advanced now.  Current diet order is dysphagia 3 Nectar thick liquids- following SLP assessment. She is consuming approximately 75% of meals at this time.   Wt Readings from Last 15 Encounters:  05/14/17 176 lb 2.4 oz (79.9 kg)  11/23/16 178 lb (80.7 kg)  10/06/16 180 lb (81.6 kg)  09/07/16 177 lb (80.3 kg)  03/01/15 170 lb (77.1 kg)   Body mass index is 32.22 kg/m. Patient meets criteria for obese based on current BMI.   Plan: Magic cup daily with lunch. Recommend she continue with nectar thick supplement at discharge for at least 1 month.   Royann Shivers MS,RD,CSG,LDN Office: [phone] Pager: [phone]

## 2017-05-14 NOTE — Progress Notes (Signed)
Modified Barium Swallow Progress Note  Patient Details  Name: Mariah White MRN: [id number] Date of Birth: 05-05-1950  Today's Date: 05/14/2017  Modified Barium Swallow completed.  Full report located under Chart Review in the Imaging Section.  Brief recommendations include the following:  Clinical Impression  Pt presents with mild oropharyngeal phase dysphagia characterized by delay in swallow initiation with swallow trigger after filling the pyriforms with thins (especially with large or sequential sips) resulting in trace penetration and no overt aspiration observed. Pt with timely swallow trigger with NTL and no penetration/aspiration observed. Pharyngeal swallow WNL with puree and regular textures. Recommend advancing to D3/mech soft and continue NTL for now due to fluctuating mental status and respiratory status. Prognosis for advancing to thin liquids is good with continued improvement in clinical course. Pt in agreement with plan of care. OK for po meds whole presented with NTL. Esophageal sweep revealed delayed bolus emptying and some retrograde movement of bolus and brief stasis of barium tablet near thoracic esophagus (likely aortic arch) which eventually cleared. SLP will follow during acute stay.    Swallow Evaluation Recommendations       SLP Diet Recommendations: Dysphagia 3 (Mech soft) solids;Nectar thick liquid       Medication Administration: Whole meds with liquid(whole meds with NTL)   Supervision: Patient able to self feed;Intermittent supervision to cue for compensatory strategies   Compensations: Slow rate;Small sips/bites   Postural Changes: Remain semi-upright after after feeds/meals (Comment);Seated upright at 90 degrees   Oral Care Recommendations: Oral care BID   Other Recommendations: Clarify dietary restrictions;Order thickener from pharmacy  Thank you,  Havery Moros, CCC-SLP [phone]  [name] 05/14/2017,1:52 PM

## 2017-05-15 LAB — GLUCOSE, CAPILLARY
GLUCOSE-CAPILLARY: 153 mg/dL — AB (ref 65–99)
Glucose-Capillary: 125 mg/dL — ABNORMAL HIGH (ref 65–99)
Glucose-Capillary: 140 mg/dL — ABNORMAL HIGH (ref 65–99)
Glucose-Capillary: 155 mg/dL — ABNORMAL HIGH (ref 65–99)
Glucose-Capillary: 152 mg/dL — ABNORMAL HIGH (ref 65–99)

## 2017-05-15 MED ORDER — INSULIN ASPART 100 UNIT/ML ~~LOC~~ SOLN: 0.0000 [IU] | Freq: Three times a day (TID) | SUBCUTANEOUS | Status: DC

## 2017-05-15 MED ORDER — INSULIN ASPART 100 UNIT/ML ~~LOC~~ SOLN
0.0000 [IU] | Freq: Three times a day (TID) | SUBCUTANEOUS | Status: DC
  Administered 2017-05-15: 2 [IU] via SUBCUTANEOUS
  Administered 2017-05-15 – 2017-05-16 (×2): 3 [IU] via SUBCUTANEOUS

## 2017-05-15 MED ORDER — PREDNISONE 20 MG PO TABS
50.0000 mg | ORAL_TABLET | Freq: Every day | ORAL | Status: DC
  Administered 2017-05-16 – 2017-05-17 (×2): 50 mg via ORAL
  Filled 2017-05-15 (×2): qty 2

## 2017-05-15 MED ORDER — INSULIN ASPART 100 UNIT/ML ~~LOC~~ SOLN
0.0000 [IU] | Freq: Every day | SUBCUTANEOUS | Status: DC
  Administered 2017-05-16: 2 [IU] via SUBCUTANEOUS

## 2017-05-15 NOTE — Care Management Note (Signed)
Case Management Note  Patient Details  Name: Mariah White MRN: [id number] Date of Birth: 10/04/1950   If discussed at Long Length of Stay Meetings, dates discussed:  05/15/2017  Additional Comments:  Seba Madole, Chrystine Oiler, RN 05/15/2017, 12:08 PM

## 2017-05-15 NOTE — Progress Notes (Signed)
[facility] PROGRESS NOTE  DESHANA WIEMER ESL:[id number] DOB: Oct 25, 1950 DOA: 05/03/2017 PCP: Kirstie Peri, MD  Interim summary and HPI 67 year old female with history of diastolic CHF, COPD, CAD, prior CVA and hypertension found at home on the floor by her son.  Patient was in her usual state the previous night.  EMS found her to have some facial droop.  In the ED patient was septic with fever of 101.5 F, hypotensive, hypoxic, tachycardic and elevated leukocytes with elevated lactic acid meeting criteria for sepsis.  Source of infection presumed to be pneumonia.  Assessment/Plan: 1-sepsis secondary to pneumonia with acute respiratory failure with hypoxemia: -With presumption for aspiration pneumonia.  But patient empirically improved with Levaquin antibiotic therapy. She completed 10 days treatment on 2/9.  -Following  BCID and patient improvement, Aztreonam was discontinued on 2/6. -Continue supportive care and continue weaning oxygen supplementation as tolerated -Patient has not longer required BIPAP; and finally off high flow oxygen supplementation. Now stable on 2-3L [place]. -appreciate assistance and recommendations from pulmonary service.  -overall stable and waiting for SNF to embrace rehabilitation journey.  2-COPD exacerbation -breathing stable overall -has remained off BIPAP, and able to turn down to 2-3L [place] today -will continue nebulizer and steroids tapering  -continue to wean oxygen as tolerated -completed 10 days of antibiotics on 2/9 and has remained afebrile and non-toxic. -Continue use of flutter valve -follow CXR in 4-6 weeks to assure resolution of infiltrates. -continue home inhaler/nebulizer regimen now.  3-chronic diastolic heart failure -overall controlled and compensated. -Last echo in May 2018 with preserved ejection fraction at 75%. -continue daily home dose of Lasix -Follow daily weights and strict intake and output.  4-acute kidney injury -In the  setting of poor perfusion with sepsis. -Renal function back to normal now -Will monitor renal function trend intermittently. -patient reported good urine output.  5-HTN -continue PRN hydralazine. -Blood pressure overall stable. -Will continue current antihypertensive regimen   6-acute toxic metabolic encephalopathy -due to infection and hospital-acquired delirium. -Continue constant reorientation -Continue low-dose Seroquel. -Mentation significantly improved.  7-leukocytosis -No further fevers -Nontoxic in appearance -Monitor WBC trend off antibiotics.  8-moderate protein calorie malnutrition -speech to assist with evaluation to safely advance diet. Appreciate rec's.  -Patient with further improvement in her swallowing; now upgraded to Dys 3 and nectar thick liquids  -TNA discontinued on 2/10  Code Status: Full Family Communication: No family at bedside. Disposition Plan: Remains inpatient; waiting on [facility] for SNF discharge. Mentation clear and overall medically improved for discharge.  Consultants:  Pulmonary service   Procedures:  See below for X-ray reports   Antibiotics:  Aztreonam 1/31>>2/6  Levaquin 1/31>>05/12/17  HPI/Subjective: Overall stable, no fever, no CP no nausea, no vomiting. Reported breathing is stable, but still using 2-3L [place] supplementation.  Objective: Vitals:   05/15/17 1550 05/15/17 1940  BP:    Pulse:    Resp:    Temp:    SpO2: 96% 96%    Intake/Output Summary (Last 24 hours) at 05/15/2017 2010 Last data filed at 05/15/2017 1720 Gross per 24 hour  Intake 540 ml  Output 200 ml  Net 340 ml   Filed Weights   05/13/17 0500 05/14/17 0500 05/15/17 1550  Weight: 79.9 kg (176 lb 2.4 oz) 79.9 kg (176 lb 2.4 oz) 79 kg (174 lb 2.6 oz)    Exam:   General: no fever, no CP, no nausea or vomiting. Reported feeling better. good mood and currently oriented X3. Reports feeling weak and deconditioned; using  2-3L [place].  Cardiovascular: S1 and  S2, no rubs, no gallops, no JVD  Respiratory: improved air movement, no crackles, no using accessory muscles. Still on 2-3L [place] supplementation; very little exp wheezing.  Abdomen: soft, NT, ND, positive BS  Musculoskeletal: no edema, no cyanosis, no clubbing   Data Reviewed: Basic Metabolic Panel: Recent Labs  Lab 05/09/17 0539 05/11/17 0545 05/13/17 0503  NA 143 144 137  K 4.0 4.2 4.4  CL 102 106 105  CO2 27 28 22   GLUCOSE 128* 231* 263*  BUN 44* 55* 47*  CREATININE 0.99 1.07* 0.89  CALCIUM 9.1 8.8* 8.5*  MG  --  2.8*  --   PHOS  --  5.4*  --    Liver Function Tests: Recent Labs  Lab 05/11/17 0545 05/13/17 0503  AST 39 21  ALT 53 39  ALKPHOS 55 54  BILITOT 0.8 0.7  PROT 5.8* 5.6*  ALBUMIN 2.5* 2.6*   CBC: Recent Labs  Lab 05/09/17 0539 05/11/17 0545  WBC 17.8* 11.7*  NEUTROABS  --  10.7*  HGB 12.6 11.0*  HCT 39.7 35.3*  MCV 93.4 94.1  PLT 185 197   BNP (last 3 results) Recent Labs    05/03/17 1610  BNP 202.0*   Studies: Dg Swallowing Func-speech Pathology  Result Date: 05/14/2017 Objective Swallowing Evaluation: Type of Study: MBS-Modified Barium Swallow Study  Patient Details Name: DRUANNE BOSQUES MRN: [id number] Date of Birth: 15-Nov-1950 Today's Date: 05/14/2017 Time: SLP Start Time (ACUTE ONLY): 1112 -SLP Stop Time (ACUTE ONLY): 1136 SLP Time Calculation (min) (ACUTE ONLY): 24 min Past Medical History: Past Medical History: Diagnosis Date . Anxiety  . Arthritis  . CHF (congestive heart failure) (HCC)  . Chronic back pain  . Chronic pain  . COPD (chronic obstructive pulmonary disease) (HCC)  . Coronary artery disease  . Depression  . GERD (gastroesophageal reflux disease)  . Hypertension  . Migraine  . Stroke [facility])  Past Surgical History: Past Surgical History: Procedure Laterality Date . ABDOMINAL HYSTERECTOMY   . breast lump removed   . BREAST SURGERY   . CHOLECYSTECTOMY   . FOOT SURGERY   HPI: GERALYN FIGIEL is a 67 y.o. female with medical history, per  documentation, significant for diastolic CHF, COPD, CAD, hypertension, and prior CVA who was found down at home at approximately 1545 by her son who lives with her.  He called EMS and stated at that time that he last saw her at her usual baseline the previous night at 2200.  EMS apparently noted that the patient would turn her head to look at the staff and was noted to have some right facial droop and left arm weakness.  She was also noted to have some low O2 saturations, but otherwise felt warm. Vital signs are currently stable.  Chest x-ray demonstrates right lower lobe infiltrate  Subjective: "I hope I can get off the puree." Assessment / Plan / Recommendation CHL IP CLINICAL IMPRESSIONS 05/14/2017 Clinical Impression Pt presents with mild oropharyngeal phase dysphagia characterized by delay in swallow initiation with swallow trigger after filling the pyriforms with thins (especially with large or sequential sips) resulting in trace penetration and no overt aspiration observed. Pt with timely swallow trigger with NTL and no penetration/aspiration observed. Pharyngeal swallow WNL with puree and regular textures. Recommend advancing to D3/mech soft and continue NTL for now due to fluctuating mental status and respiratory status. Prognosis for advancing to thin liquids is good with continued improvement in clinical course. Pt  in agreement with plan of care. OK for po meds whole presented with NTL. Esophageal sweep revealed delayed bolus emptying and some retrograde movement of bolus and brief stasis of barium tablet near thoracic esophagus (likely aortic arch) which eventually cleared. SLP will follow during acute stay. SLP Visit Diagnosis Dysphagia, oropharyngeal phase (R13.12) Attention and concentration deficit following -- Frontal lobe and executive function deficit following -- Impact on safety and function Mild aspiration risk;Moderate aspiration risk   CHL IP TREATMENT RECOMMENDATION 05/14/2017 Treatment  Recommendations Therapy as outlined in treatment plan below   Prognosis 05/14/2017 Prognosis for Safe Diet Advancement Good Barriers to Reach Goals Cognitive deficits Barriers/Prognosis Comment -- CHL IP DIET RECOMMENDATION 05/14/2017 SLP Diet Recommendations Dysphagia 3 (Mech soft) solids;Nectar thick liquid Liquid Administration via -- Medication Administration Whole meds with liquid Compensations Slow rate;Small sips/bites Postural Changes Remain semi-upright after after feeds/meals (Comment);Seated upright at 90 degrees   CHL IP OTHER RECOMMENDATIONS 05/14/2017 Recommended Consults -- Oral Care Recommendations Oral care BID Other Recommendations Clarify dietary restrictions;Order thickener from pharmacy   CHL IP FOLLOW UP RECOMMENDATIONS 05/14/2017 Follow up Recommendations Skilled Nursing facility   [facility] IP FREQUENCY AND DURATION 05/04/2017 Speech Therapy Frequency (ACUTE ONLY) min 2x/week Treatment Duration 2 weeks      CHL IP ORAL PHASE 05/14/2017 Oral Phase WFL Oral - Pudding Teaspoon -- Oral - Pudding Cup -- Oral - Honey Teaspoon -- Oral - Honey Cup -- Oral - Nectar Teaspoon -- Oral - Nectar Cup -- Oral - Nectar Straw -- Oral - Thin Teaspoon -- Oral - Thin Cup -- Oral - Thin Straw -- Oral - Puree -- Oral - Mech Soft -- Oral - Regular -- Oral - Multi-Consistency -- Oral - Pill -- Oral Phase - Comment --  CHL IP PHARYNGEAL PHASE 05/14/2017 Pharyngeal Phase Impaired Pharyngeal- Pudding Teaspoon -- Pharyngeal -- Pharyngeal- Pudding Cup -- Pharyngeal -- Pharyngeal- Honey Teaspoon -- Pharyngeal -- Pharyngeal- Honey Cup -- Pharyngeal -- Pharyngeal- Nectar Teaspoon -- Pharyngeal -- Pharyngeal- Nectar Cup Delayed swallow initiation-vallecula Pharyngeal -- Pharyngeal- Nectar Straw Delayed swallow initiation-vallecula Pharyngeal -- Pharyngeal- Thin Teaspoon Delayed swallow initiation-pyriform sinuses Pharyngeal -- Pharyngeal- Thin Cup Delayed swallow initiation-pyriform sinuses;Penetration/Aspiration during swallow Pharyngeal  Material enters airway, remains ABOVE vocal cords and not ejected out Pharyngeal- Thin Straw Delayed swallow initiation-pyriform sinuses;Penetration/Aspiration during swallow Pharyngeal Material enters airway, remains ABOVE vocal cords and not ejected out Pharyngeal- Puree WFL Pharyngeal -- Pharyngeal- Mechanical Soft -- Pharyngeal -- Pharyngeal- Regular WFL Pharyngeal -- Pharyngeal- Multi-consistency -- Pharyngeal -- Pharyngeal- Pill Delayed swallow initiation-pyriform sinuses Pharyngeal -- Pharyngeal Comment trace penetration of thins noted with large and/or sequential sips  CHL IP CERVICAL ESOPHAGEAL PHASE 05/14/2017 Cervical Esophageal Phase Impaired Pudding Teaspoon -- Pudding Cup -- Honey Teaspoon -- Honey Cup -- Nectar Teaspoon -- Nectar Cup (No Data) Nectar Straw -- Thin Teaspoon -- Thin Cup -- Thin Straw -- Puree -- Mechanical Soft -- Regular -- Multi-consistency -- Pill -- Cervical Esophageal Comment some retrograde bolus movement noted in esophagus during sweep Thank you, Havery Moros, CCC-SLP [phone] No flowsheet data found. PORTER,DABNEY 05/14/2017, 1:58 PM               Scheduled Meds: . budesonide (PULMICORT) nebulizer solution  0.5 mg Nebulization BID  . calcium-vitamin D  1 tablet Oral Daily  . chlorhexidine  15 mL Mouth Rinse BID  . clopidogrel  75 mg Oral Daily  . enoxaparin (LOVENOX) injection  40 mg Subcutaneous Q24H  . furosemide  20 mg Oral Daily  .  gabapentin  300 mg Oral TID  . insulin aspart  0-15 Units Subcutaneous TID WC  . insulin aspart  0-5 Units Subcutaneous QHS  . ipratropium  0.5 mg Nebulization Q6H WA  . levalbuterol  0.63 mg Nebulization Q6H WA  . lisinopril  20 mg Oral Daily  . magnesium oxide  400 mg Oral Daily  . mouth rinse  15 mL Mouth Rinse q12n4p  . methylPREDNISolone (SOLU-MEDROL) injection  40 mg Intravenous Q12H  . metoprolol succinate  12.5 mg Oral Daily  . pantoprazole  40 mg Oral Daily  . QUEtiapine  25 mg Oral QHS    Principal Problem:    Sepsis (HCC) Active Problems:   CHF (congestive heart failure) (HCC)   COPD (chronic obstructive pulmonary disease) (HCC)   CAD (coronary artery disease)   Essential hypertension   Stroke (HCC)   Aspiration pneumonia (HCC)   AKI (acute kidney injury) (HCC)    Time spent: 30 minutes    Vassie Loll  [facility] Pager [phone]. If 7PM-7AM, please contact night-coverage at [email or web address], password [facility] 05/15/2017, 8:10 PM  LOS: 12 days

## 2017-05-15 NOTE — Plan of Care (Signed)
  Education: Knowledge of General Education information will improve 05/15/2017 2116 - Progressing by Wynne Dust, RN

## 2017-05-15 NOTE — Clinical Social Work Note (Addendum)
LCSW following. Working on SNF rehab placement. Mariah White has been accepted at [facility]. [facility] R still pending. [facility] has indicated a level 2 screen is indicated so Mariah White will not be able to be discharged until this is complete and determination is made. Updated MD. Mariah White not yet medically stable for dc. Will update Mariah White and follow up tomorrow.   12:00  Met with Mariah White to update on SNF options and she states that she will take the bed at [facility]. Jackey Loge at the SNF. Will follow.

## 2017-05-15 NOTE — Care Management Important Message (Signed)
Important Message  Patient Details  Name: Mariah White MRN: [id number] Date of Birth: [birth year]   Medicare Important Message Given:  Yes    Spencer Cardinal, Chrystine Oiler, RN 05/15/2017, 12:08 PM

## 2017-05-16 DIAGNOSIS — A419 Sepsis, unspecified organism: Principal | ICD-10-CM

## 2017-05-16 DIAGNOSIS — J690 Pneumonitis due to inhalation of food and vomit: Secondary | ICD-10-CM

## 2017-05-16 DIAGNOSIS — J441 Chronic obstructive pulmonary disease with (acute) exacerbation: Secondary | ICD-10-CM

## 2017-05-16 DIAGNOSIS — I5032 Chronic diastolic (congestive) heart failure: Secondary | ICD-10-CM

## 2017-05-16 DIAGNOSIS — N179 Acute kidney failure, unspecified: Secondary | ICD-10-CM

## 2017-05-16 DIAGNOSIS — I10 Essential (primary) hypertension: Secondary | ICD-10-CM

## 2017-05-16 LAB — GLUCOSE, CAPILLARY
GLUCOSE-CAPILLARY: 69 mg/dL (ref 65–99)
GLUCOSE-CAPILLARY: 158 mg/dL — AB (ref 65–99)
GLUCOSE-CAPILLARY: 229 mg/dL — AB (ref 65–99)
Glucose-Capillary: 109 mg/dL — ABNORMAL HIGH (ref 65–99)

## 2017-05-16 NOTE — Progress Notes (Signed)
Physical Therapy Treatment Patient Details Name: REAM White MRN: [id number] DOB: December 06, 1950 Today's Date: 05/16/2017    History of Present Illness Mariah White is a 67 y.o. female with medical history, per documentation, significant for diastolic CHF, COPD, CAD, hypertension, and prior CVA who was found down at home at approximately 1545 by her son who lives with her.  He called EMS and stated at that time that he last saw her at her usual baseline the previous night at 2200.  EMS apparently noted that the patient would turn her head to look at the staff and was noted to have some right facial droop and left arm weakness.  She was also noted to have some low O2 saturations, but otherwise felt warm.    PT Comments    Patient limited mostly due to BLE weakness and fatigue when attempting to ambulate with RW, able to take a few steps to transfer to chair, but unsafe to ambulate beyond bedside due to fall risk.  Patient tolerated sitting up in chair after therapy with family present in room.  Patient will benefit from continued physical therapy in hospital and recommended venue below to increase strength, balance, endurance for safe ADLs and gait.    Follow Up Recommendations  SNF     Equipment Recommendations  None recommended by PT    Recommendations for Other Services       Precautions / Restrictions Precautions Precautions: Fall Restrictions Weight Bearing Restrictions: No    Mobility  Bed Mobility Overal bed mobility: Needs Assistance Bed Mobility: Supine to Sit     Supine to sit: Min assist     General bed mobility comments: demonstrates slow labored movement  Transfers Overall transfer level: Needs assistance Equipment used: Rolling walker (2 wheeled) Transfers: Sit to/from [employer] Sit to Stand: Mod assist Stand pivot transfers: Mod assist       General transfer comment: difficulty with sit to stands, transfers due to BLE  weakness  Ambulation/Gait Ambulation/Gait assistance: Mod assist Ambulation Distance (Feet): 4 Feet Assistive device: Rolling walker (2 wheeled) Gait Pattern/deviations: Decreased step length - right;Decreased step length - left;Decreased stride length   Gait velocity interpretation: Below normal speed for age/gender General Gait Details: limited to 5-6 short unsteady steps due to BLE weakness and fatigue   Stairs            Wheelchair Mobility    Modified Rankin (Stroke Patients Only)       Balance Overall balance assessment: Needs assistance Sitting-balance support: Feet supported;No upper extremity supported Sitting balance-Leahy Scale: Good     Standing balance support: Bilateral upper extremity supported;During functional activity Standing balance-Leahy Scale: Poor Standing balance comment: fair/poor with RW                            Cognition Arousal/Alertness: Awake/alert Behavior During Therapy: WFL for tasks assessed/performed Overall Cognitive Status: Within Functional Limits for tasks assessed                                        Exercises General Exercises - Lower Extremity Long Arc Quad: Seated;AROM;Strengthening;Both;10 reps Hip Flexion/Marching: Seated;AROM;Strengthening;Both;10 reps Toe Raises: Seated;AROM;Strengthening;Both;10 reps Heel Raises: Seated;AROM;Strengthening;Both;10 reps    General Comments        Pertinent Vitals/Pain Pain Assessment: No/denies pain    Home Living  Prior Function            PT Goals (current goals can now be found in the care plan section) Acute Rehab PT Goals Patient Stated Goal: return home after rehab PT Goal Formulation: With patient/family Time For Goal Achievement: 05/28/17 Potential to Achieve Goals: Good Progress towards PT goals: Progressing toward goals    Frequency    Min 3X/week      PT Plan Current plan remains  appropriate    Co-evaluation              AM-PAC PT "6 Clicks" Daily Activity  Outcome Measure  Difficulty turning over in bed (including adjusting bedclothes, sheets and blankets)?: None Difficulty moving from lying on back to sitting on the side of the bed? : A Little Difficulty sitting down on and standing up from a chair with arms (e.g., wheelchair, bedside commode, etc,.)?: A Lot Help needed moving to and from a bed to chair (including a wheelchair)?: A Lot Help needed walking in hospital room?: A Lot Help needed climbing 3-5 steps with a railing? : Total 6 Click Score: 14    End of Session   Activity Tolerance: Patient tolerated treatment well;Patient limited by fatigue Patient left: in chair;with call bell/phone within reach;with family/visitor present Nurse Communication: Mobility status PT Visit Diagnosis: Unsteadiness on feet (R26.81);Other abnormalities of gait and mobility (R26.89);Muscle weakness (generalized) (M62.81)     Time: 4098-1191 PT Time Calculation (min) (ACUTE ONLY): 27 min  Charges:  $Therapeutic Activity: 23-37 mins                    G Codes:       1:36 PM, 06/15/2017 Ocie Bob, MPT Physical Therapist with [facility] 336 [phone] office [phone] mobile phone

## 2017-05-16 NOTE — Clinical Social Work Note (Addendum)
LCSW following. Received call from [facility] screener that she will be here at 6pm to evaluate pt. Pt will not be able to dc today. Hopefully she will be cleared for dc tomorrow. Updated Thayer Ohm at [facility] and updated MD. Will speak to pt to update as well.  LCSW, Tretha Sciara, will be covering on 2/14 and she will facilitate pt's dc once [facility] number provided.

## 2017-05-16 NOTE — Progress Notes (Signed)
[facility] PROGRESS NOTE  Mariah White:[id number] DOB: 1950/06/05 DOA: 05/03/2017 PCP: Kirstie Peri, MD  Interim summary and HPI 67 year old female with history of diastolic CHF, COPD, CAD, prior CVA and hypertension found at home on the floor by her son.  Patient was in her usual state the previous night.  EMS found her to have some facial droop.  In the ED patient was septic with fever of 101.5 F, hypotensive, hypoxic, tachycardic and elevated leukocytes with elevated lactic acid meeting criteria for sepsis.  Source of infection presumed to be pneumonia.  Assessment/Plan: 1-sepsis secondary to pneumonia with acute respiratory failure with hypoxemia: -With presumption for aspiration pneumonia.  But patient empirically improved with Levaquin antibiotic therapy. She completed 10 days treatment on 2/9.  -Following  BCID and patient improvement, Aztreonam was discontinued on 2/6. -Continue supportive care and continue weaning oxygen supplementation as tolerated -Patient has not longer required BIPAP; and finally off high flow oxygen supplementation. Now stable on 2L [place]. -appreciate assistance and recommendations from pulmonary service.  -overall stable and waiting for SNF for rehabilitation.  2-COPD exacerbation -breathing stable overall -has remained off BIPAP, and able to turn down to 2-3L [place] today -will continue nebulizer and steroids tapering  -continue to wean oxygen as tolerated -completed 10 days of antibiotics on 2/9 and has remained afebrile and non-toxic. -Continue use of flutter valve -follow CXR in 4-6 weeks to assure resolution of infiltrates. -continue home inhaler/nebulizer regimen now.  3-chronic diastolic heart failure -overall controlled and compensated. -Last echo in May 2018 with preserved ejection fraction at 75%. -continue daily home dose of Lasix -Follow daily weights and strict intake and output.  4-acute kidney injury -In the setting of poor  perfusion with sepsis. -Renal function back to normal now -Will monitor renal function trend intermittently.  5-HTN -continue PRN hydralazine. -Blood pressure overall stable. -Will continue current antihypertensive regimen   6-acute toxic metabolic encephalopathy -due to infection and hospital-acquired delirium. -Continue constant reorientation -Continue low-dose Seroquel. -Mentation significantly improved.  7-leukocytosis -No further fevers -Nontoxic in appearance -Monitor WBC trend off antibiotics.  8-moderate protein calorie malnutrition -speech to assist with evaluation to safely advance diet. Appreciate rec's.  -Patient with further improvement in her swallowing; now upgraded to Dys 3 and nectar thick liquids  -TNA discontinued on 2/10  Code Status: Full Family Communication: No family at bedside. Disposition Plan: Remains inpatient; waiting on [facility] for SNF discharge. Mentation clear and overall medically improved for discharge.  Hopefully could discharge 2/14.   Consultants:  Pulmonary service   Procedures:  See below for X-ray reports   Antibiotics:  Aztreonam 1/31>>2/6  Levaquin 1/31>>05/12/17  HPI/Subjective: Pt had some blurry vision earlier but it has fully resolved now.    Objective: Vitals:   05/16/17 0539 05/16/17 0752  BP: 108/60   Pulse: 68   Resp: 16   Temp: 98.3 F (36.8 C)   SpO2: 94% 95%    Intake/Output Summary (Last 24 hours) at 05/16/2017 1033 Last data filed at 05/16/2017 0540 Gross per 24 hour  Intake 600 ml  Output 400 ml  Net 200 ml   Filed Weights   05/14/17 0500 05/15/17 1550 05/16/17 0539  Weight: 79.9 kg (176 lb 2.4 oz) 79 kg (174 lb 2.6 oz) 76 kg (167 lb 8.8 oz)    Exam:   General: no fever, no CP, no nausea or vomiting. Reported feeling better. good mood and currently oriented X3. Reports feeling weak and deconditioned; using 2-3L .  Cardiovascular:  S1 and S2, no rubs, no gallops, no JVD  Respiratory: improved  air movement, no crackles, no using accessory muscles. Still on 2-3L  supplementation; very little exp wheezing.  Abdomen: soft, NT, ND, positive BS  Musculoskeletal: no edema, no cyanosis, no clubbing   Data Reviewed: Basic Metabolic Panel: Recent Labs  Lab 05/11/17 0545 05/13/17 0503  NA 144 137  K 4.2 4.4  CL 106 105  CO2 28 22  GLUCOSE 231* 263*  BUN 55* 47*  CREATININE 1.07* 0.89  CALCIUM 8.8* 8.5*  MG 2.8*  --   PHOS 5.4*  --    Liver Function Tests: Recent Labs  Lab 05/11/17 0545 05/13/17 0503  AST 39 21  ALT 53 39  ALKPHOS 55 54  BILITOT 0.8 0.7  PROT 5.8* 5.6*  ALBUMIN 2.5* 2.6*   CBC: Recent Labs  Lab 05/11/17 0545  WBC 11.7*  NEUTROABS 10.7*  HGB 11.0*  HCT 35.3*  MCV 94.1  PLT 197   BNP (last 3 results) Recent Labs    05/03/17 1610  BNP 202.0*   Studies: Dg Swallowing Func-speech Pathology  Result Date: 05/14/2017 Objective Swallowing Evaluation: Type of Study: MBS-Modified Barium Swallow Study  Patient Details Name: Mariah White MRN: [id number] Date of Birth: [birth year] Today's Date: 05/14/2017 Time: SLP Start Time (ACUTE ONLY): 1112 -SLP Stop Time (ACUTE ONLY): 1136 SLP Time Calculation (min) (ACUTE ONLY): 24 min Past Medical History: Past Medical History: Diagnosis Date . Anxiety  . Arthritis  . CHF (congestive heart failure) (HCC)  . Chronic back pain  . Chronic pain  . COPD (chronic obstructive pulmonary disease) (HCC)  . Coronary artery disease  . Depression  . GERD (gastroesophageal reflux disease)  . Hypertension  . Migraine  . Stroke [facility])  Past Surgical History: Past Surgical History: Procedure Laterality Date . ABDOMINAL HYSTERECTOMY   . breast lump removed   . BREAST SURGERY   . CHOLECYSTECTOMY   . FOOT SURGERY   HPI: SARSHA White is a 67 y.o. female with medical history, per documentation, significant for diastolic CHF, COPD, CAD, hypertension, and prior CVA who was found down at home at approximately 1545 by her son who lives with  her.  He called EMS and stated at that time that he last saw her at her usual baseline the previous night at 2200.  EMS apparently noted that the patient would turn her head to look at the staff and was noted to have some right facial droop and left arm weakness.  She was also noted to have some low O2 saturations, but otherwise felt warm. Vital signs are currently stable.  Chest x-ray demonstrates right lower lobe infiltrate  Subjective: "I hope I can get off the puree." Assessment / Plan / Recommendation CHL IP CLINICAL IMPRESSIONS 05/14/2017 Clinical Impression Pt presents with mild oropharyngeal phase dysphagia characterized by delay in swallow initiation with swallow trigger after filling the pyriforms with thins (especially with large or sequential sips) resulting in trace penetration and no overt aspiration observed. Pt with timely swallow trigger with NTL and no penetration/aspiration observed. Pharyngeal swallow WNL with puree and regular textures. Recommend advancing to D3/mech soft and continue NTL for now due to fluctuating mental status and respiratory status. Prognosis for advancing to thin liquids is good with continued improvement in clinical course. Pt in agreement with plan of care. OK for po meds whole presented with NTL. Esophageal sweep revealed delayed bolus emptying and some retrograde movement of bolus and brief stasis of  barium tablet near thoracic esophagus (likely aortic arch) which eventually cleared. SLP will follow during acute stay. SLP Visit Diagnosis Dysphagia, oropharyngeal phase (R13.12) Attention and concentration deficit following -- Frontal lobe and executive function deficit following -- Impact on safety and function Mild aspiration risk;Moderate aspiration risk   CHL IP TREATMENT RECOMMENDATION 05/14/2017 Treatment Recommendations Therapy as outlined in treatment plan below   Prognosis 05/14/2017 Prognosis for Safe Diet Advancement Good Barriers to Reach Goals Cognitive deficits  Barriers/Prognosis Comment -- CHL IP DIET RECOMMENDATION 05/14/2017 SLP Diet Recommendations Dysphagia 3 (Mech soft) solids;Nectar thick liquid Liquid Administration via -- Medication Administration Whole meds with liquid Compensations Slow rate;Small sips/bites Postural Changes Remain semi-upright after after feeds/meals (Comment);Seated upright at 90 degrees   CHL IP OTHER RECOMMENDATIONS 05/14/2017 Recommended Consults -- Oral Care Recommendations Oral care BID Other Recommendations Clarify dietary restrictions;Order thickener from pharmacy   CHL IP FOLLOW UP RECOMMENDATIONS 05/14/2017 Follow up Recommendations Skilled Nursing facility   [facility] IP FREQUENCY AND DURATION 05/04/2017 Speech Therapy Frequency (ACUTE ONLY) min 2x/week Treatment Duration 2 weeks      CHL IP ORAL PHASE 05/14/2017 Oral Phase WFL Oral - Pudding Teaspoon -- Oral - Pudding Cup -- Oral - Honey Teaspoon -- Oral - Honey Cup -- Oral - Nectar Teaspoon -- Oral - Nectar Cup -- Oral - Nectar Straw -- Oral - Thin Teaspoon -- Oral - Thin Cup -- Oral - Thin Straw -- Oral - Puree -- Oral - Mech Soft -- Oral - Regular -- Oral - Multi-Consistency -- Oral - Pill -- Oral Phase - Comment --  CHL IP PHARYNGEAL PHASE 05/14/2017 Pharyngeal Phase Impaired Pharyngeal- Pudding Teaspoon -- Pharyngeal -- Pharyngeal- Pudding Cup -- Pharyngeal -- Pharyngeal- Honey Teaspoon -- Pharyngeal -- Pharyngeal- Honey Cup -- Pharyngeal -- Pharyngeal- Nectar Teaspoon -- Pharyngeal -- Pharyngeal- Nectar Cup Delayed swallow initiation-vallecula Pharyngeal -- Pharyngeal- Nectar Straw Delayed swallow initiation-vallecula Pharyngeal -- Pharyngeal- Thin Teaspoon Delayed swallow initiation-pyriform sinuses Pharyngeal -- Pharyngeal- Thin Cup Delayed swallow initiation-pyriform sinuses;Penetration/Aspiration during swallow Pharyngeal Material enters airway, remains ABOVE vocal cords and not ejected out Pharyngeal- Thin Straw Delayed swallow initiation-pyriform sinuses;Penetration/Aspiration during  swallow Pharyngeal Material enters airway, remains ABOVE vocal cords and not ejected out Pharyngeal- Puree WFL Pharyngeal -- Pharyngeal- Mechanical Soft -- Pharyngeal -- Pharyngeal- Regular WFL Pharyngeal -- Pharyngeal- Multi-consistency -- Pharyngeal -- Pharyngeal- Pill Delayed swallow initiation-pyriform sinuses Pharyngeal -- Pharyngeal Comment trace penetration of thins noted with large and/or sequential sips  CHL IP CERVICAL ESOPHAGEAL PHASE 05/14/2017 Cervical Esophageal Phase Impaired Pudding Teaspoon -- Pudding Cup -- Honey Teaspoon -- Honey Cup -- Nectar Teaspoon -- Nectar Cup (No Data) Nectar Straw -- Thin Teaspoon -- Thin Cup -- Thin Straw -- Puree -- Mechanical Soft -- Regular -- Multi-consistency -- Pill -- Cervical Esophageal Comment some retrograde bolus movement noted in esophagus during sweep Thank you, Havery Moros, CCC-SLP [phone] No flowsheet data found. PORTER,DABNEY 05/14/2017, 1:58 PM               Scheduled Meds: . budesonide (PULMICORT) nebulizer solution  0.5 mg Nebulization BID  . calcium-vitamin D  1 tablet Oral Daily  . chlorhexidine  15 mL Mouth Rinse BID  . clopidogrel  75 mg Oral Daily  . enoxaparin (LOVENOX) injection  40 mg Subcutaneous Q24H  . furosemide  20 mg Oral Daily  . gabapentin  300 mg Oral TID  . insulin aspart  0-15 Units Subcutaneous TID WC  . insulin aspart  0-5 Units Subcutaneous QHS  . ipratropium  0.5  mg Nebulization Q6H WA  . levalbuterol  0.63 mg Nebulization Q6H WA  . lisinopril  20 mg Oral Daily  . magnesium oxide  400 mg Oral Daily  . mouth rinse  15 mL Mouth Rinse q12n4p  . metoprolol succinate  12.5 mg Oral Daily  . pantoprazole  40 mg Oral Daily  . predniSONE  50 mg Oral Q breakfast  . QUEtiapine  25 mg Oral QHS    Principal Problem:   Sepsis (HCC) Active Problems:   CHF (congestive heart failure) (HCC)   COPD (chronic obstructive pulmonary disease) (HCC)   CAD (coronary artery disease)   Essential hypertension   Stroke  (HCC)   Aspiration pneumonia (HCC)   AKI (acute kidney injury) (HCC)  Time spent: 22 minutes  Clanford Johnson  [facility] Pager [phone] If 7PM-7AM, please contact night-coverage at [email or web address], password [facility] 05/16/2017, 10:33 AM  LOS: 13 days

## 2017-05-17 LAB — GLUCOSE, CAPILLARY
GLUCOSE-CAPILLARY: 74 mg/dL (ref 65–99)
GLUCOSE-CAPILLARY: 102 mg/dL — AB (ref 65–99)

## 2017-05-17 MED ORDER — PREDNISONE 20 MG PO TABS: 20.0000 mg | ORAL_TABLET | Freq: Every day | ORAL | 0 refills | Status: AC

## 2017-05-17 MED ORDER — FUROSEMIDE 20 MG PO TABS: 20.0000 mg | ORAL_TABLET | Freq: Every day | ORAL | Status: DC

## 2017-05-17 MED ORDER — METOPROLOL SUCCINATE ER 25 MG PO TB24: 12.5000 mg | ORAL_TABLET | Freq: Every day | ORAL | Status: DC

## 2017-05-17 MED ORDER — ALPRAZOLAM 0.5 MG PO TABS: 0.5000 mg | ORAL_TABLET | Freq: Two times a day (BID) | ORAL | 0 refills | Status: DC | PRN

## 2017-05-17 MED ORDER — OXYCODONE-ACETAMINOPHEN 5-325 MG PO TABS: 1.0000 | ORAL_TABLET | Freq: Four times a day (QID) | ORAL | 0 refills | Status: DC | PRN

## 2017-05-17 MED ORDER — LISINOPRIL 20 MG PO TABS: 20.0000 mg | ORAL_TABLET | Freq: Every day | ORAL | Status: DC

## 2017-05-17 NOTE — Discharge Summary (Addendum)
Physician Discharge Summary  Mariah White UJW:[id number] DOB: 1950-10-25 DOA: 05/03/2017  PCP: Kirstie Peri, MD  Admit date: 05/03/2017 Discharge date: 05/17/2017  Disposition: Skilled nursing facility Recommendations for Outpatient Follow-up:  1. Follow up with PCP in 2 weeks 2. Please obtain BMP/CBC in one week 3. Please follow up on the following pending results: final culture data Please continue prednisone for 5 additional days and then discontinue. Please monitor blood glucose 3 times per day while on prednisone. Please monitor blood pressure daily. Repeat CXR in 4-6 weeks to assure resolution of infiltrates  Discharge Condition: STABLE  CODE STATUS: FULL  DIET RECOMMENDATIONS: SLP Diet Recommendations: Dysphagia 3 (Mech soft) solids;Nectar thick liquid  Medication Administration: Whole meds with liquid(whole meds with NTL)  Supervision: Patient able to self feed;Intermittent supervision to cue for compensatory strategies  Compensations: Slow rate;Small sips/bites  Postural Changes: Remain semi-upright after after feeds/meals (Comment);Seated upright at 90 degrees  Oral Care Recommendations: Oral care BID  Other Recommendations: Clarify dietary restrictions;Order thickener from pharmacy  Brief Hospitalization Summary: Please see all hospital notes, images, labs for full details of the hospitalization.  Interim summary and HPI 67 year old female with history of diastolic CHF, COPD, CAD, prior CVA and hypertension found at home on the floor by her son. Patient was in her usual state the previous night. EMS found her to have some facial droop.  In the ED patient was septic with fever of 101.5 F, hypotensive, hypoxic, tachycardic and elevated leukocytes with elevated lactic acid meeting criteria for sepsis.  Source of infection presumed to be pneumonia.  Assessment/Plan: 1-sepsis secondary to pneumonia with acute respiratory failure with hypoxemia: -With presumption  for aspiration pneumonia.  But patient empirically improved with Levaquin antibiotic therapy. She completed 10 days treatment on 2/9.  -Following  BCID and patient improvement, Aztreonam was discontinued on 2/6. -Patient has not longer required BIPAP; and off high flow oxygen supplementation. Now stable on supplemental nasal cannula oxygen 2L/min. -appreciate assistance and recommendations from pulmonary service.  -overall stable and waiting for SNF for rehabilitation.  2-COPD exacerbation -breathing stable overall -has remained off BIPAP, and on room air. - steroids tapering - Continue prednisone 20 mg daily for 5 days, then STOP -completed 10 days of antibiotics on 2/9 and has remained afebrile and non-toxic. -Continue use of flutter valve -follow CXR in 4-6 weeks to assure resolution of infiltrates. -continue home inhaler/nebulizer regimen now.  3-chronic diastolic heart failure -overall controlled and compensated. -Last echo in May 2018 with preserved ejection fraction at 75%. -continue daily Lasix 20 mg  4-acute kidney injury - RESOLVED -In the setting of poor perfusion with sepsis. -Renal function back to normal now -Will monitor renal function trend intermittently.  5-HTN -Blood pressure overall stable. -Will continue antihypertensive regimen, reduced a lot of her old BP meds due to low BPs.    6-acute toxic metabolic encephalopathy -due to infection and hospital-acquired delirium. -Continue constant reorientation -resume home antipsychotic. -Mentation significantly improved.  7-leukocytosis -No further fevers -Nontoxic in appearance -Monitor WBC trend off antibiotics.  8-moderate protein calorie malnutrition -speech to assist with evaluation to safely advance diet. Appreciate rec's.  -Patient with further improvement in her swallowing; now upgraded to Dys 3 and nectar thick liquids  -TNA discontinued on 2/10  Code Status: Full Family Communication: No  family at bedside. Disposition Plan: SNF  Consultants:  Pulmonary service   Procedures:  See below for X-ray reports   Antibiotics:  Aztreonam 1/31>>2/6  Levaquin 1/31>>05/12/17  Discharge Diagnoses:  Principal Problem:   Sepsis (HCC) Active Problems:   CHF (congestive heart failure) (HCC)   COPD (chronic obstructive pulmonary disease) (HCC)   CAD (coronary artery disease)   Essential hypertension   Stroke (HCC)   Aspiration pneumonia (HCC)   AKI (acute kidney injury) [facility])  Discharge Instructions: Discharge Instructions    Increase activity slowly   Complete by:  As directed      Allergies as of 05/17/2017      Reactions   Penicillins Itching      Medication List    STOP taking these medications   amLODipine 10 MG tablet Commonly known as:  NORVASC   lisinopril 40 MG tablet Commonly known as:  PRINIVIL,ZESTRIL   prazosin 2 MG capsule Commonly known as:  MINIPRESS     TAKE these medications   albuterol 108 (90 Base) MCG/ACT inhaler Commonly known as:  PROVENTIL HFA;VENTOLIN HFA Inhale 2 puffs into the lungs 2 (two) times daily.   ALPRAZolam 0.5 MG tablet Commonly known as:  XANAX Take 1 tablet (0.5 mg total) by mouth 2 (two) times daily as needed for anxiety. What changed:    medication strength  how much to take  when to take this  reasons to take this   ARIPiprazole 5 MG tablet Commonly known as:  ABILIFY Take 1 tablet (5 mg total) at bedtime by mouth.   CALCIUM 600-D PO Take 1 tablet by mouth daily.   clopidogrel 75 MG tablet Commonly known as:  PLAVIX Take 75 mg by mouth daily.   Fish Oil 1000 MG Caps Take by mouth 2 (two) times daily.   FLUoxetine 20 MG capsule Commonly known as:  PROZAC Take 1 capsule (20 mg total) daily by mouth.   Fluticasone-Salmeterol 250-50 MCG/DOSE Aepb Commonly known as:  ADVAIR Inhale 1 puff into the lungs 2 (two) times daily.   furosemide 20 MG tablet Commonly known as:  LASIX Take 1 tablet  (20 mg total) by mouth daily. What changed:    medication strength  how much to take   gabapentin 600 MG tablet Commonly known as:  NEURONTIN Take 600 mg 3 (three) times daily by mouth.   Magnesium Oxide 400 (240 Mg) MG Tabs Take 1 tablet (400 mg total) by mouth daily.   metoprolol succinate 25 MG 24 hr tablet Commonly known as:  TOPROL-XL Take 0.5 tablets (12.5 mg total) by mouth daily. Take with or immediately following a meal. What changed:    medication strength  how much to take   oxyCODONE-acetaminophen 5-325 MG tablet Commonly known as:  PERCOCET/ROXICET Take 1 tablet by mouth every 6 (six) hours as needed for severe pain. What changed:    how much to take  when to take this   pantoprazole 40 MG tablet Commonly known as:  PROTONIX Take 40 mg by mouth daily.   pravastatin 80 MG tablet Commonly known as:  PRAVACHOL Take 80 mg by mouth daily.   predniSONE 20 MG tablet Commonly known as:  DELTASONE Take 1 tablet (20 mg total) by mouth daily with breakfast for 5 days. Take 3 PO QAM x3days, 2 PO QAM x3days, 1 PO QAM x3days Start taking on:  05/18/2017      Contact information for after-discharge care    Destination    [facility] Follow up.   Service:  Skilled Nursing Contact information: [address] [place] [address] [phone]             Allergies  Allergen Reactions  . Penicillins Itching   Allergies as of 05/17/2017      Reactions   Penicillins Itching      Medication List    STOP taking these medications   amLODipine 10 MG tablet Commonly known as:  NORVASC   lisinopril 40 MG tablet Commonly known as:  PRINIVIL,ZESTRIL   prazosin 2 MG capsule Commonly known as:  MINIPRESS     TAKE these medications   albuterol 108 (90 Base) MCG/ACT inhaler Commonly known as:  PROVENTIL HFA;VENTOLIN HFA Inhale 2 puffs into the lungs 2 (two) times daily.   ALPRAZolam 0.5 MG tablet Commonly known as:   XANAX Take 1 tablet (0.5 mg total) by mouth 2 (two) times daily as needed for anxiety. What changed:    medication strength  how much to take  when to take this  reasons to take this   ARIPiprazole 5 MG tablet Commonly known as:  ABILIFY Take 1 tablet (5 mg total) at bedtime by mouth.   CALCIUM 600-D PO Take 1 tablet by mouth daily.   clopidogrel 75 MG tablet Commonly known as:  PLAVIX Take 75 mg by mouth daily.   Fish Oil 1000 MG Caps Take by mouth 2 (two) times daily.   FLUoxetine 20 MG capsule Commonly known as:  PROZAC Take 1 capsule (20 mg total) daily by mouth.   Fluticasone-Salmeterol 250-50 MCG/DOSE Aepb Commonly known as:  ADVAIR Inhale 1 puff into the lungs 2 (two) times daily.   furosemide 20 MG tablet Commonly known as:  LASIX Take 1 tablet (20 mg total) by mouth daily. What changed:    medication strength  how much to take   gabapentin 600 MG tablet Commonly known as:  NEURONTIN Take 600 mg 3 (three) times daily by mouth.   Magnesium Oxide 400 (240 Mg) MG Tabs Take 1 tablet (400 mg total) by mouth daily.   metoprolol succinate 25 MG 24 hr tablet Commonly known as:  TOPROL-XL Take 0.5 tablets (12.5 mg total) by mouth daily. Take with or immediately following a meal. What changed:    medication strength  how much to take   oxyCODONE-acetaminophen 5-325 MG tablet Commonly known as:  PERCOCET/ROXICET Take 1 tablet by mouth every 6 (six) hours as needed for severe pain. What changed:    how much to take  when to take this   pantoprazole 40 MG tablet Commonly known as:  PROTONIX Take 40 mg by mouth daily.   pravastatin 80 MG tablet Commonly known as:  PRAVACHOL Take 80 mg by mouth daily.   predniSONE 20 MG tablet Commonly known as:  DELTASONE Take 1 tablet (20 mg total) by mouth daily with breakfast for 5 days. Take 3 PO QAM x3days, 2 PO QAM x3days, 1 PO QAM x3days Start taking on:  05/18/2017       Procedures/Studies: Ct Head  Wo Contrast  Result Date: 05/03/2017 CLINICAL DATA:  Found unresponsive EXAM: CT HEAD WITHOUT CONTRAST TECHNIQUE: Contiguous axial images were obtained from the base of the skull through the vertex without intravenous contrast. COMPARISON:  02/01/2017 FINDINGS: Brain: No acute intracranial abnormality. Specifically, no hemorrhage, hydrocephalus, mass lesion, acute infarction, or significant intracranial injury. Vascular: No hyperdense vessel or unexpected calcification. Skull: No acute calvarial abnormality. Sinuses/Orbits: Visualized paranasal sinuses and mastoids clear. Orbital soft tissues unremarkable. Other: None IMPRESSION: No acute intracranial abnormality. Electronically Signed   By: Charlett Nose M.D.   On: 05/03/2017 18:52   Mr Brain Wo Contrast  Result  Date: 05/04/2017 CLINICAL DATA:  Altered level of consciousness, unexplained. Patient was found unconscious. EXAM: MRI HEAD WITHOUT CONTRAST TECHNIQUE: Multiplanar, multiecho pulse sequences of the brain and surrounding structures were obtained without intravenous contrast. COMPARISON:  Head CT from yesterday FINDINGS: Brain: No acute infarction, hemorrhage, hydrocephalus, extra-axial collection or mass lesion. 4 or 5 small areas of cortically based FLAIR signal abnormality with subtle volume loss along the left cerebral hemisphere, compatible with remote small MCA/watershed distribution infarcts. Patient has history of stroke. Mild chronic small vessel ischemic type changes in the cerebral white matter. No atrophy or chronic hemorrhagic injury. Vascular: Major flow voids are preserved. Skull and upper cervical spine: No evidence of marrow lesion. Sinuses/Orbits: Bilateral cataract resection. IMPRESSION: 1. No acute finding. 2. Small scattered remote cortical infarcts in the left MCA/watershed distribution. Electronically Signed   By: Marnee Spring M.D.   On: 05/04/2017 13:16   Dg Chest Port 1 View  Result Date: 05/12/2017 CLINICAL DATA:  Shortness  of breath. EXAM: PORTABLE CHEST 1 VIEW COMPARISON:  Chest x-ray dated May 05, 2017. FINDINGS: Interval placement of a right-sided PICC line with the tip in the distal SVC. Unchanged borderline cardiomegaly. Mild interstitial edema. Significantly improved diffuse bilateral airspace disease. No focal consolidation, pleural effusion, or pneumothorax. No acute osseous abnormality. IMPRESSION: 1. Significantly improved diffuse bilateral airspace disease, likely reflecting resolving infection or alveolar edema. Mild persistent interstitial pulmonary edema. Electronically Signed   By: Obie Dredge M.D.   On: 05/12/2017 07:52   Dg Chest Port 1 View  Result Date: 05/05/2017 CLINICAL DATA:  Hypoxemia.  Sepsis. EXAM: PORTABLE CHEST 1 VIEW COMPARISON:  05/03/2017 FINDINGS: The cardiac silhouette is upper limits of normal in size. Lung volumes are improved compared to the prior study. There is worsening of previously described right lung airspace opacity, now diffusely involving the right lung. New diffuse left lung airspace opacity is also present. No sizable pleural effusion or pneumothorax is identified. IMPRESSION: Marked worsening of airspace disease, now with diffuse bilateral involvement. Electronically Signed   By: Sebastian Ache M.D.   On: 05/05/2017 08:49   Dg Chest Port 1 View  Result Date: 05/03/2017 CLINICAL DATA:  Patient found unresponsive. EXAM: PORTABLE CHEST 1 VIEW COMPARISON:  Aug 07, 2016 FINDINGS: There is focal infiltrate in the right lower lung, likely pneumonia or aspiration. The cardiomediastinal silhouette is normal given technique. No pneumothorax. No other acute abnormalities. IMPRESSION: New infiltrate in the right lower lung, likely pneumonia or aspiration. Recommend follow-up to resolution. Electronically Signed   By: Gerome Sam III M.D   On: 05/03/2017 16:12   Dg Swallowing Func-speech Pathology  Result Date: 05/14/2017 Objective Swallowing Evaluation: Type of Study:  MBS-Modified Barium Swallow Study  Patient Details Name: JOHNATHON MITTAL MRN: [id number] Date of Birth: 27-Oct-1950 Today's Date: 05/14/2017 Time: SLP Start Time (ACUTE ONLY): 1112 -SLP Stop Time (ACUTE ONLY): 1136 SLP Time Calculation (min) (ACUTE ONLY): 24 min Past Medical History: Past Medical History: Diagnosis Date . Anxiety  . Arthritis  . CHF (congestive heart failure) (HCC)  . Chronic back pain  . Chronic pain  . COPD (chronic obstructive pulmonary disease) (HCC)  . Coronary artery disease  . Depression  . GERD (gastroesophageal reflux disease)  . Hypertension  . Migraine  . Stroke [facility])  Past Surgical History: Past Surgical History: Procedure Laterality Date . ABDOMINAL HYSTERECTOMY   . breast lump removed   . BREAST SURGERY   . CHOLECYSTECTOMY   . FOOT SURGERY   HPI: Mariah White is a 67 y.o. female with medical history, per documentation, significant for diastolic CHF, COPD, CAD, hypertension, and prior CVA who was found down at home at approximately 1545 by her son who lives with her.  He called EMS and stated at that time that he last saw her at her usual baseline the previous night at 2200.  EMS apparently noted that the patient would turn her head to look at the staff and was noted to have some right facial droop and left arm weakness.  She was also noted to have some low O2 saturations, but otherwise felt warm. Vital signs are currently stable.  Chest x-ray demonstrates right lower lobe infiltrate  Subjective: "I hope I can get off the puree." Assessment / Plan / Recommendation CHL IP CLINICAL IMPRESSIONS 05/14/2017 Clinical Impression Pt presents with mild oropharyngeal phase dysphagia characterized by delay in swallow initiation with swallow trigger after filling the pyriforms with thins (especially with large or sequential sips) resulting in trace penetration and no overt aspiration observed. Pt with timely swallow trigger with NTL and no penetration/aspiration observed. Pharyngeal swallow WNL with  puree and regular textures. Recommend advancing to D3/mech soft and continue NTL for now due to fluctuating mental status and respiratory status. Prognosis for advancing to thin liquids is good with continued improvement in clinical course. Pt in agreement with plan of care. OK for po meds whole presented with NTL. Esophageal sweep revealed delayed bolus emptying and some retrograde movement of bolus and brief stasis of barium tablet near thoracic esophagus (likely aortic arch) which eventually cleared. SLP will follow during acute stay. SLP Visit Diagnosis Dysphagia, oropharyngeal phase (R13.12) Attention and concentration deficit following -- Frontal lobe and executive function deficit following -- Impact on safety and function Mild aspiration risk;Moderate aspiration risk   CHL IP TREATMENT RECOMMENDATION 05/14/2017 Treatment Recommendations Therapy as outlined in treatment plan below   Prognosis 05/14/2017 Prognosis for Safe Diet Advancement Good Barriers to Reach Goals Cognitive deficits Barriers/Prognosis Comment -- CHL IP DIET RECOMMENDATION 05/14/2017 SLP Diet Recommendations Dysphagia 3 (Mech soft) solids;Nectar thick liquid Liquid Administration via -- Medication Administration Whole meds with liquid Compensations Slow rate;Small sips/bites Postural Changes Remain semi-upright after after feeds/meals (Comment);Seated upright at 90 degrees   CHL IP OTHER RECOMMENDATIONS 05/14/2017 Recommended Consults -- Oral Care Recommendations Oral care BID Other Recommendations Clarify dietary restrictions;Order thickener from pharmacy   CHL IP FOLLOW UP RECOMMENDATIONS 05/14/2017 Follow up Recommendations Skilled Nursing facility   [facility] IP FREQUENCY AND DURATION 05/04/2017 Speech Therapy Frequency (ACUTE ONLY) min 2x/week Treatment Duration 2 weeks      CHL IP ORAL PHASE 05/14/2017 Oral Phase WFL Oral - Pudding Teaspoon -- Oral - Pudding Cup -- Oral - Honey Teaspoon -- Oral - Honey Cup -- Oral - Nectar Teaspoon -- Oral - Nectar  Cup -- Oral - Nectar Straw -- Oral - Thin Teaspoon -- Oral - Thin Cup -- Oral - Thin Straw -- Oral - Puree -- Oral - Mech Soft -- Oral - Regular -- Oral - Multi-Consistency -- Oral - Pill -- Oral Phase - Comment --  CHL IP PHARYNGEAL PHASE 05/14/2017 Pharyngeal Phase Impaired Pharyngeal- Pudding Teaspoon -- Pharyngeal -- Pharyngeal- Pudding Cup -- Pharyngeal -- Pharyngeal- Honey Teaspoon -- Pharyngeal -- Pharyngeal- Honey Cup -- Pharyngeal -- Pharyngeal- Nectar Teaspoon -- Pharyngeal -- Pharyngeal- Nectar Cup Delayed swallow initiation-vallecula Pharyngeal -- Pharyngeal- Nectar Straw Delayed swallow initiation-vallecula Pharyngeal -- Pharyngeal- Thin Teaspoon Delayed swallow initiation-pyriform sinuses Pharyngeal -- Pharyngeal- Thin Cup Delayed swallow  initiation-pyriform sinuses;Penetration/Aspiration during swallow Pharyngeal Material enters airway, remains ABOVE vocal cords and not ejected out Pharyngeal- Thin Straw Delayed swallow initiation-pyriform sinuses;Penetration/Aspiration during swallow Pharyngeal Material enters airway, remains ABOVE vocal cords and not ejected out Pharyngeal- Puree WFL Pharyngeal -- Pharyngeal- Mechanical Soft -- Pharyngeal -- Pharyngeal- Regular WFL Pharyngeal -- Pharyngeal- Multi-consistency -- Pharyngeal -- Pharyngeal- Pill Delayed swallow initiation-pyriform sinuses Pharyngeal -- Pharyngeal Comment trace penetration of thins noted with large and/or sequential sips  CHL IP CERVICAL ESOPHAGEAL PHASE 05/14/2017 Cervical Esophageal Phase Impaired Pudding Teaspoon -- Pudding Cup -- Honey Teaspoon -- Honey Cup -- Nectar Teaspoon -- Nectar Cup (No Data) Nectar Straw -- Thin Teaspoon -- Thin Cup -- Thin Straw -- Puree -- Mechanical Soft -- Regular -- Multi-consistency -- Pill -- Cervical Esophageal Comment some retrograde bolus movement noted in esophagus during sweep Thank you, Havery Moros, CCC-SLP [phone] No flowsheet data found. PORTER,DABNEY 05/14/2017, 1:58 PM                 Subjective: Patient is sitting up in the bed.  She is eating breakfast.  She says she feels much better.  She is oriented x3.  She is in no apparent distress.  She has no complaints.  Discharge Exam: Vitals:   05/17/17 0645 05/17/17 0749  BP:    Pulse: 80   Resp: 15   Temp: 98 F (36.7 C)   SpO2: 99% 90%   Vitals:   05/16/17 2230 05/17/17 0500 05/17/17 0645 05/17/17 0749  BP: (!) 104/49     Pulse: 78  80   Resp: 16  15   Temp: 99 F (37.2 C)  98 F (36.7 C)   TempSrc:   Oral   SpO2: 96%  99% 90%  Weight:  79.1 kg (174 lb 6.1 oz)    Height:         General: no fever, no CP, no nausea or vomiting. Reported feeling better. good mood and currently oriented X3. Reports feeling weak and deconditioned; using 2-3L [place].  Cardiovascular: S1 and S2, no rubs, no gallops, no JVD  Respiratory: improved air movement, no crackles, no using accessory muscles. Still on 2-3L [place] supplementation; very little exp wheezing.  Abdomen: soft, NT, ND, positive BS  Musculoskeletal: no edema, no cyanosis, no clubbing     The results of significant diagnostics from this hospitalization (including imaging, microbiology, ancillary and laboratory) are listed below for reference.     Microbiology: No results found for this or any previous visit (from the past 240 hour(s)).   Labs: BNP (last 3 results) Recent Labs    05/03/17 1610  BNP 202.0*   Basic Metabolic Panel: Recent Labs  Lab 05/11/17 0545 05/13/17 0503  NA 144 137  K 4.2 4.4  CL 106 105  CO2 28 22  GLUCOSE 231* 263*  BUN 55* 47*  CREATININE 1.07* 0.89  CALCIUM 8.8* 8.5*  MG 2.8*  --   PHOS 5.4*  --    Liver Function Tests: Recent Labs  Lab 05/11/17 0545 05/13/17 0503  AST 39 21  ALT 53 39  ALKPHOS 55 54  BILITOT 0.8 0.7  PROT 5.8* 5.6*  ALBUMIN 2.5* 2.6*   No results for input(s): LIPASE, AMYLASE in the last 168 hours. No results for input(s): AMMONIA in the last 168 hours. CBC: Recent Labs  Lab  05/11/17 0545  WBC 11.7*  NEUTROABS 10.7*  HGB 11.0*  HCT 35.3*  MCV 94.1  PLT 197   Cardiac Enzymes: No results  for input(s): CKTOTAL, CKMB, CKMBINDEX, TROPONINI in the last 168 hours. BNP: Invalid input(s): POCBNP CBG: Recent Labs  Lab 05/16/17 0746 05/16/17 1213 05/16/17 1653 05/16/17 2228 05/17/17 0757  GLUCAP 69 109* 158* 229* 74   D-Dimer No results for input(s): DDIMER in the last 72 hours. Hgb A1c No results for input(s): HGBA1C in the last 72 hours. Lipid Profile No results for input(s): CHOL, HDL, LDLCALC, TRIG, CHOLHDL, LDLDIRECT in the last 72 hours. Thyroid function studies No results for input(s): TSH, T4TOTAL, T3FREE, THYROIDAB in the last 72 hours.  Invalid input(s): FREET3 Anemia work up No results for input(s): VITAMINB12, FOLATE, FERRITIN, TIBC, IRON, RETICCTPCT in the last 72 hours. Urinalysis    Component Value Date/Time   COLORURINE YELLOW 05/03/2017 1554   APPEARANCEUR CLEAR 05/03/2017 1554   LABSPEC 1.014 05/03/2017 1554   PHURINE 5.0 05/03/2017 1554   GLUCOSEU NEGATIVE 05/03/2017 1554   HGBUR NEGATIVE 05/03/2017 1554   BILIRUBINUR NEGATIVE 05/03/2017 1554   KETONESUR NEGATIVE 05/03/2017 1554   PROTEINUR NEGATIVE 05/03/2017 1554   NITRITE NEGATIVE 05/03/2017 1554   LEUKOCYTESUR NEGATIVE 05/03/2017 1554   Sepsis Labs Invalid input(s): PROCALCITONIN,  WBC,  LACTICIDVEN Microbiology No results found for this or any previous visit (from the past 240 hour(s)).  Time coordinating discharge: 40 mins  SIGNED:  Standley Dakins, MD  [facility] 05/17/2017, 10:48 AM Pager [phone]  If 7PM-7AM, please contact night-coverage [email or web address] Password [email or web address]

## 2017-05-17 NOTE — Discharge Instructions (Signed)
Please continue prednisone for 5 additional days and then discontinue. Please monitor blood glucose 3 times per day while on prednisone. Please monitor blood pressure daily.     Follow with Primary MD  Kirstie Peri, MD  and other consultant's as instructed your Hospitalist MD  Please get a complete blood count and chemistry panel checked by your Primary MD at your next visit, and again as instructed by your Primary MD.  Get Medicines reviewed and adjusted: Please take all your medications with you for your next visit with your Primary MD  Laboratory/radiological data: Please request your Primary MD to go over all hospital tests and procedure/radiological results at the follow up, please ask your Primary MD to get all Hospital records sent to his/her office.  In some cases, they will be blood work, cultures and biopsy results pending at the time of your discharge. Please request that your primary care M.D. follows up on these results.  Also Note the following: If you experience worsening of your admission symptoms, develop shortness of breath, life threatening emergency, suicidal or homicidal thoughts you must seek medical attention immediately by calling 911 or calling your MD immediately  if symptoms less severe.  You must read complete instructions/literature along with all the possible adverse reactions/side effects for all the Medicines you take and that have been prescribed to you. Take any new Medicines after you have completely understood and accpet all the possible adverse reactions/side effects.   Do not drive when taking Pain medications or sleeping medications (Benzodaizepines)  Do not take more than prescribed Pain, Sleep and Anxiety Medications. It is not advisable to combine anxiety,sleep and pain medications without talking with your primary care practitioner  Special Instructions: If you have smoked or chewed Tobacco  in the last 2 yrs please stop smoking, stop any regular  Alcohol  and or any Recreational drug use.  Wear Seat belts while driving.  Please note: You were cared for by a hospitalist during your hospital stay. Once you are discharged, your primary care physician will handle any further medical issues. Please note that NO REFILLS for any discharge medications will be authorized once you are discharged, as it is imperative that you return to your primary care physician (or establish a relationship with a primary care physician if you do not have one) for your post hospital discharge needs so that they can reassess your need for medications and monitor your lab values.

## 2017-05-17 NOTE — Progress Notes (Signed)
Walked in room with assist and 02 was 85 on room air.  Contacted Dr. Laural Benes with this and case worker due to needing 02.  Received order for picc line removal and Jessica removed .  Called report to Ixchel , nurse at [facility] in [place].

## 2017-05-17 NOTE — Progress Notes (Signed)
BP  95/61  Pulse 95.  Was in low 100's last night.  Held lisinopril 20 and gave metroprolol 12.5  Texted Dr. Laural Benes with this information

## 2017-05-17 NOTE — Clinical Social Work Placement (Signed)
   CLINICAL SOCIAL WORK PLACEMENT  NOTE  Date:  05/17/2017  Patient Details  Name: Mariah White MRN: [id number] Date of Birth: 1950-08-16  Clinical Social Work is seeking post-discharge placement for this patient at the Skilled  Nursing Facility level of care (*CSW will initial, date and re-position this form in  chart as items are completed):  Yes   Patient/family provided with [facility] Social Work Department's list of facilities offering this level of care within the geographic area requested by the patient (or if unable, by the patient's family).  Yes   Patient/family informed of their freedom to choose among providers that offer the needed level of care, that participate in Medicare, Medicaid or managed care program needed by the patient, have an available bed and are willing to accept the patient.  Yes   Patient/family informed of 's ownership interest in [facility] and [facility], as well as of the fact that they are under no obligation to receive care at these facilities.  PASRR submitted to EDS on 05/14/17     PASRR number received on       Existing PASRR number confirmed on       FL2 transmitted to all facilities in geographic area requested by pt/family on 05/14/17     FL2 transmitted to all facilities within larger geographic area on       Patient informed that his/her managed care company has contracts with or will negotiate with certain facilities, including the following:        Yes   Patient/family informed of bed offers received.  Patient chooses bed at [facility]     Physician recommends and patient chooses bed at      Patient to be transferred to [facility] on 05/17/17.  Patient to be transferred to facility by [facility]     Patient family notified on 05/17/17 of transfer.  Name of family member notified:  Number for son was wrong number and additional number on chart had a voicemail box that had not been seet up.       PHYSICIAN       Additional Comment:  Facility notified and discharge clinicals sent. LCSW signing off.   _______________________________________________ Annice Needy, LCSW 05/17/2017, 2:28 PM

## 2017-05-29 ENCOUNTER — Ambulatory Visit: Payer: Self-pay | Admitting: Cardiology

## 2017-05-29 NOTE — Progress Notes (Deleted)
Clinical Summary Mariah White is a 67 y.o.female seen today for follow up of the following medical problems.   1. SOB/Acute on chronic sysotlic HF - started 1 year ago. Can occur at rest or with activity. Can have some coughing or wheezing. Can have some chest tightness at times. Midchest, 4/10 in severity. Tightness can occur at rest or with activity. Can be positional. Lasts a few minutes - has had some recent LE edema. Started about 4 months. Started on lasix a few months ago by - 08/2016 echo: LVEF >75%, abnormal diastolic function per report. Normal LA, normal RV. Regarding diastolic function E/A 0.9, lateral e' 9cm, E/e' 8, decel time not reported, no normal LA.  - not improved with lasix.   - we recently increased lasix to 60mg  in AM and 40mg  in PM.  - home weights around 176-177 lbs. Edema is improving, still some SOB   - recent AKI during admission for pneumonia and sepsis.   2. COPD - followed by pcp -takes advair daily, albuterol prn  3. History of CVA - she has been on ASA and plavix   4. Pneumonia - admit 05/2017 with pneumonia and sepsis   Past Medical History:  Diagnosis Date  . Anxiety   . Arthritis   . CHF (congestive heart failure) (HCC)   . Chronic back pain   . Chronic pain   . COPD (chronic obstructive pulmonary disease) (HCC)   . Coronary artery disease   . Depression   . GERD (gastroesophageal reflux disease)   . Hypertension   . Migraine   . Stroke [facility])      Allergies  Allergen Reactions  . Penicillins Itching     Current Outpatient Medications  Medication Sig Dispense Refill  . albuterol (PROVENTIL HFA;VENTOLIN HFA) 108 (90 BASE) MCG/ACT inhaler Inhale 2 puffs into the lungs 2 (two) times daily.    Marland Kitchen ALPRAZolam (XANAX) 0.5 MG tablet Take 1 tablet (0.5 mg total) by mouth 2 (two) times daily as needed for anxiety. 15 tablet 0  . ARIPiprazole (ABILIFY) 5 MG tablet Take 1 tablet (5 mg total) at bedtime by mouth. 30 tablet 2  .  Calcium Carb-Cholecalciferol (CALCIUM 600-D PO) Take 1 tablet by mouth daily.    . clopidogrel (PLAVIX) 75 MG tablet Take 75 mg by mouth daily.    Marland Kitchen FLUoxetine (PROZAC) 20 MG capsule Take 1 capsule (20 mg total) daily by mouth. 30 capsule 2  . Fluticasone-Salmeterol (ADVAIR) 250-50 MCG/DOSE AEPB Inhale 1 puff into the lungs 2 (two) times daily.    . furosemide (LASIX) 20 MG tablet Take 1 tablet (20 mg total) by mouth daily.    Marland Kitchen gabapentin (NEURONTIN) 600 MG tablet Take 600 mg 3 (three) times daily by mouth.  3  . Magnesium Oxide 400 (240 Mg) MG TABS Take 1 tablet (400 mg total) by mouth daily. 30 tablet 6  . metoprolol succinate (TOPROL-XL) 25 MG 24 hr tablet Take 0.5 tablets (12.5 mg total) by mouth daily. Take with or immediately following a meal.    . Omega-3 Fatty Acids (FISH OIL) 1000 MG CAPS Take by mouth 2 (two) times daily.    Marland Kitchen oxyCODONE-acetaminophen (PERCOCET/ROXICET) 5-325 MG tablet Take 1 tablet by mouth every 6 (six) hours as needed for severe pain. 12 tablet 0  . pantoprazole (PROTONIX) 40 MG tablet Take 40 mg by mouth daily.    . pravastatin (PRAVACHOL) 80 MG tablet Take 80 mg by mouth daily.  No current facility-administered medications for this visit.      Past Surgical History:  Procedure Laterality Date  . ABDOMINAL HYSTERECTOMY    . breast lump removed    . BREAST SURGERY    . CHOLECYSTECTOMY    . FOOT SURGERY       Allergies  Allergen Reactions  . Penicillins Itching      Family History  Problem Relation Age of Onset  . Bipolar disorder Daughter      Social History Mariah White reports that she has been smoking cigarettes.  She has been smoking about 1.00 pack per day. she has never used smokeless tobacco. Mariah White reports that she does not drink alcohol.   Review of Systems CONSTITUTIONAL: No weight loss, fever, chills, weakness or fatigue.  HEENT: Eyes: No visual loss, blurred vision, double vision or yellow sclerae.No hearing loss, sneezing,  congestion, runny nose or sore throat.  SKIN: No rash or itching.  CARDIOVASCULAR:  RESPIRATORY: No shortness of breath, cough or sputum.  GASTROINTESTINAL: No anorexia, nausea, vomiting or diarrhea. No abdominal pain or blood.  GENITOURINARY: No burning on urination, no polyuria NEUROLOGICAL: No headache, dizziness, syncope, paralysis, ataxia, numbness or tingling in the extremities. No change in bowel or bladder control.  MUSCULOSKELETAL: No muscle, back pain, joint pain or stiffness.  LYMPHATICS: No enlarged nodes. No history of splenectomy.  PSYCHIATRIC: No history of depression or anxiety.  ENDOCRINOLOGIC: No reports of sweating, cold or heat intolerance. No polyuria or polydipsia.  Marland Kitchen   Physical Examination There were no vitals filed for this visit. There were no vitals filed for this visit.  Gen: resting comfortably, no acute distress HEENT: no scleral icterus, pupils equal round and reactive, no palptable cervical adenopathy,  CV Resp: Clear to auscultation bilaterally GI: abdomen is soft, non-tender, non-distended, normal bowel sounds, no hepatosplenomegaly MSK: extremities are warm, no edema.  Skin: warm, no rash Neuro:  no focal deficits Psych: appropriate affect   Diagnostic Studies     Assessment and Plan  1. Acute on chronic diastolic heart failure - edema has improved, we will continue diuretics. Check BMET/Mg      Antoine Poche, M.D., F.A.C.C.

## 2017-06-06 ENCOUNTER — Encounter (HOSPITAL_COMMUNITY): Payer: Self-pay | Admitting: Psychiatry

## 2017-06-06 ENCOUNTER — Ambulatory Visit (INDEPENDENT_AMBULATORY_CARE_PROVIDER_SITE_OTHER): Payer: Medicare Other | Admitting: Psychiatry

## 2017-06-06 ENCOUNTER — Telehealth (HOSPITAL_COMMUNITY): Payer: Self-pay | Admitting: Speech Pathology

## 2017-06-06 VITALS — BP 98/57 | HR 110 | Ht 62.0 in | Wt 174.0 lb

## 2017-06-06 DIAGNOSIS — Z91411 Personal history of adult psychological abuse: Secondary | ICD-10-CM

## 2017-06-06 DIAGNOSIS — F321 Major depressive disorder, single episode, moderate: Secondary | ICD-10-CM | POA: Diagnosis not present

## 2017-06-06 DIAGNOSIS — Z62810 Personal history of physical and sexual abuse in childhood: Secondary | ICD-10-CM | POA: Diagnosis not present

## 2017-06-06 DIAGNOSIS — Z91410 Personal history of adult physical and sexual abuse: Secondary | ICD-10-CM

## 2017-06-06 DIAGNOSIS — F17210 Nicotine dependence, cigarettes, uncomplicated: Secondary | ICD-10-CM

## 2017-06-06 DIAGNOSIS — Z818 Family history of other mental and behavioral disorders: Secondary | ICD-10-CM

## 2017-06-06 MED ORDER — ARIPIPRAZOLE 5 MG PO TABS: 5.0000 mg | ORAL_TABLET | Freq: Every day | ORAL | 2 refills | Status: DC

## 2017-06-06 MED ORDER — ALPRAZOLAM 0.5 MG PO TABS: 0.5000 mg | ORAL_TABLET | Freq: Two times a day (BID) | ORAL | 2 refills | Status: DC | PRN

## 2017-06-06 MED ORDER — FLUOXETINE HCL 20 MG PO CAPS: 20.0000 mg | ORAL_CAPSULE | Freq: Every day | ORAL | 2 refills | Status: DC
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
## 2017-06-06 NOTE — Telephone Encounter (Signed)
Spoke with Roderick Pee from [facility] advised to call [facility]  for medical records on swallow test.

## 2017-07-10 ENCOUNTER — Ambulatory Visit (INDEPENDENT_AMBULATORY_CARE_PROVIDER_SITE_OTHER): Payer: Medicare Other | Admitting: Cardiology

## 2017-07-10 ENCOUNTER — Encounter: Payer: Self-pay | Admitting: Cardiology

## 2017-07-10 VITALS — BP 120/77 | HR 73 | Ht 62.0 in | Wt 183.0 lb

## 2017-07-10 DIAGNOSIS — I5033 Acute on chronic diastolic (congestive) heart failure: Secondary | ICD-10-CM

## 2017-07-10 MED ORDER — FUROSEMIDE 80 MG PO TABS: 80.0000 mg | ORAL_TABLET | Freq: Every day | ORAL | 1 refills | Status: DC

## 2017-07-10 MED ORDER — PANTOPRAZOLE SODIUM 40 MG PO TBEC: 40.0000 mg | DELAYED_RELEASE_TABLET | Freq: Every day | ORAL | 1 refills | Status: DC

## 2017-07-10 NOTE — Progress Notes (Signed)
Clinical Summary Mariah White is a 67 y.o.female seen today for follow up of the following medical problems.   1. SOB/Acute on chronic sysotlic HF - started 1 year ago. Can occur at rest or with activity. Can have some coughing or wheezing. Can have some chest tightness at times. Midchest, 4/10 in severity. Tightness can occur at rest or with activity. Can be positional. Lasts a few minutes - has had some recent LE edema. Started about 4 months. Started on lasix a few months ago by - 08/2016 echo: LVEF >75%, abnormal diastolic function per report. Normal LA, normal RV. Regarding diastolic function E/A 0.9, lateral e' 9cm, E/e' 8, decel time not reported, no normal LA.    - breathing improving since recent pneumonia. Mild edema - compliant with meds.   2. COPD - followed by pcp -takes advair daily, albuterol prn  3. History of CVA - she has been on ASA and plavix   4. Recent admission with sepsis/pneumonia - admitted Jan to Feb 2019 - complicated by COPD exacerbation and AKI Past Medical History:  Diagnosis Date  . Anxiety   . Arthritis   . CHF (congestive heart failure) (HCC)   . Chronic back pain   . Chronic pain   . COPD (chronic obstructive pulmonary disease) (HCC)   . Coronary artery disease   . Depression   . GERD (gastroesophageal reflux disease)   . Hypertension   . Migraine   . Stroke [facility])      Allergies  Allergen Reactions  . Penicillins Itching     Current Outpatient Medications  Medication Sig Dispense Refill  . albuterol (PROVENTIL HFA;VENTOLIN HFA) 108 (90 BASE) MCG/ACT inhaler Inhale 2 puffs into the lungs 2 (two) times daily.    Marland Kitchen ALPRAZolam (XANAX) 0.5 MG tablet Take 1 tablet (0.5 mg total) by mouth 2 (two) times daily as needed for anxiety. 30 tablet 2  . ARIPiprazole (ABILIFY) 5 MG tablet Take 1 tablet (5 mg total) by mouth at bedtime. 30 tablet 2  . Calcium Carb-Cholecalciferol (CALCIUM 600-D PO) Take 1 tablet by mouth daily.    .  clopidogrel (PLAVIX) 75 MG tablet Take 75 mg by mouth daily.    Marland Kitchen FLUoxetine (PROZAC) 20 MG capsule Take 1 capsule (20 mg total) by mouth daily. 30 capsule 2  . Fluticasone-Salmeterol (ADVAIR) 250-50 MCG/DOSE AEPB Inhale 1 puff into the lungs 2 (two) times daily.    . furosemide (LASIX) 20 MG tablet Take 1 tablet (20 mg total) by mouth daily.    Marland Kitchen gabapentin (NEURONTIN) 600 MG tablet Take 600 mg 3 (three) times daily by mouth.  3  . Magnesium Oxide 400 (240 Mg) MG TABS Take 1 tablet (400 mg total) by mouth daily. 30 tablet 6  . metoprolol succinate (TOPROL-XL) 25 MG 24 hr tablet Take 0.5 tablets (12.5 mg total) by mouth daily. Take with or immediately following a meal.    . Omega-3 Fatty Acids (FISH OIL) 1000 MG CAPS Take by mouth 2 (two) times daily.    Marland Kitchen oxyCODONE-acetaminophen (PERCOCET/ROXICET) 5-325 MG tablet Take 1 tablet by mouth every 6 (six) hours as needed for severe pain. 12 tablet 0  . pantoprazole (PROTONIX) 40 MG tablet Take 40 mg by mouth daily.    . pravastatin (PRAVACHOL) 80 MG tablet Take 80 mg by mouth daily.     No current facility-administered medications for this visit.      Past Surgical History:  Procedure Laterality Date  . ABDOMINAL  HYSTERECTOMY    . breast lump removed    . BREAST SURGERY    . CHOLECYSTECTOMY    . FOOT SURGERY       Allergies  Allergen Reactions  . Penicillins Itching      Family History  Problem Relation Age of Onset  . Bipolar disorder Daughter      Social History Ms. Kowalewski reports that she quit smoking about 8 weeks ago. Her smoking use included cigarettes. She smoked 1.00 pack per day. She has never used smokeless tobacco. Ms. Varnadore reports that she does not drink alcohol.   Review of Systems CONSTITUTIONAL: No weight loss, fever, chills, weakness or fatigue.  HEENT: Eyes: No visual loss, blurred vision, double vision or yellow sclerae.No hearing loss, sneezing, congestion, runny nose or sore throat.  SKIN: No rash or  itching.  CARDIOVASCULAR: per hpi RESPIRATORY: No shortness of breath, cough or sputum.  GASTROINTESTINAL: No anorexia, nausea, vomiting or diarrhea. No abdominal pain or blood.  GENITOURINARY: No burning on urination, no polyuria NEUROLOGICAL: No headache, dizziness, syncope, paralysis, ataxia, numbness or tingling in the extremities. No change in bowel or bladder control.  MUSCULOSKELETAL: No muscle, back pain, joint pain or stiffness.  LYMPHATICS: No enlarged nodes. No history of splenectomy.  PSYCHIATRIC: No history of depression or anxiety.  ENDOCRINOLOGIC: No reports of sweating, cold or heat intolerance. No polyuria or polydipsia.  Marland Kitchen   Physical Examination Vitals:   07/10/17 1541  BP: 120/77  Pulse: 73  SpO2: 93%   Filed Weights   07/10/17 1541  Weight: 183 lb (83 kg)    Gen: resting comfortably, no acute distress HEENT: no scleral icterus, pupils equal round and reactive, no palptable cervical adenopathy,  CV: RRR, no m/r/g, no jvd Resp: Clear to auscultation bilaterally GI: abdomen is soft, non-tender, non-distended, normal bowel sounds, no hepatosplenomegaly MSK: extremities are warm, 1+ bilateral  edema.  Skin: warm, no rash Neuro:  no focal deficits Psych: appropriate affect   Diagnostic Studies     Assessment and Plan  1. Acute on chronic diastolic heart failure - mild edema, increase lasix to 80mg  dialy. Check BMET/Mg in 2 weeks    F/u 1 month     Antoine Poche, M.D.

## 2017-07-10 NOTE — Patient Instructions (Signed)
Your physician wants you to follow-up in: 1 MONTHS WITH DR [facility]   Your physician has recommended you make the following change in your medication:   START PROTONIX 40 MG DAILY  INCREASE LASIX 80 MG DAILY  Your physician recommends that you return for lab work in: 2 WEEKS BMP/MG  Thank you for choosing [facility]!!

## 2017-07-14 ENCOUNTER — Encounter: Payer: Self-pay | Admitting: Cardiology

## 2017-07-17 ENCOUNTER — Other Ambulatory Visit (HOSPITAL_COMMUNITY): Payer: Self-pay | Admitting: Psychiatry

## 2017-07-20 ENCOUNTER — Telehealth: Payer: Self-pay | Admitting: *Deleted

## 2017-07-20 DIAGNOSIS — I5032 Chronic diastolic (congestive) heart failure: Secondary | ICD-10-CM

## 2017-07-20 MED ORDER — MAGNESIUM OXIDE -MG SUPPLEMENT 400 (240 MG) MG PO TABS: 400.0000 mg | ORAL_TABLET | Freq: Two times a day (BID) | ORAL | 1 refills | Status: DC

## 2017-07-20 MED ORDER — POTASSIUM CHLORIDE CRYS ER 20 MEQ PO TBCR: EXTENDED_RELEASE_TABLET | ORAL | 1 refills | Status: DC

## 2017-07-20 NOTE — Telephone Encounter (Signed)
Pt aware and voiced understanding - potassium and magnisiem sent to pharmacy as requested. Will mail pt lab orders. Routed to pcp

## 2017-07-20 NOTE — Telephone Encounter (Signed)
-----   Message from Antoine Poche, MD sent at 07/19/2017  8:51 AM EDT ----- Labs show low potassium and magnesium. Take KCl 40 meQ daily x 3 days, then take daily. Increase magnesium oxide to 400mg  bid, check BMET/Mg in 1 month  Dominga Ferry MD

## 2017-07-29 ENCOUNTER — Other Ambulatory Visit (HOSPITAL_COMMUNITY): Payer: Self-pay | Admitting: Psychiatry

## 2017-08-02 ENCOUNTER — Ambulatory Visit (INDEPENDENT_AMBULATORY_CARE_PROVIDER_SITE_OTHER): Payer: Medicare Other | Admitting: Cardiology

## 2017-08-02 ENCOUNTER — Encounter: Payer: Self-pay | Admitting: Cardiology

## 2017-08-02 ENCOUNTER — Other Ambulatory Visit: Payer: Self-pay

## 2017-08-02 VITALS — BP 113/75 | HR 78 | Ht 62.0 in | Wt 182.0 lb

## 2017-08-02 DIAGNOSIS — I5033 Acute on chronic diastolic (congestive) heart failure: Secondary | ICD-10-CM

## 2017-08-02 MED ORDER — FUROSEMIDE 40 MG PO TABS: 60.0000 mg | ORAL_TABLET | Freq: Two times a day (BID) | ORAL | 1 refills | Status: DC

## 2017-08-02 NOTE — Progress Notes (Signed)
Clinical Summary Mariah White is a 67 y.o.female seen today for follow up of the following medical problems.   1. SOB/Chronic diasotlic HF - started 1 year ago. Can occur at rest or with activity. Can have some coughing or wheezing. Can have some chest tightness at times. Midchest, 4/10 in severity. Tightness can occur at rest or with activity. Can be positional. Lasts a few minutes - has had some recent LE edema. Started about 4 months. Started on lasix a few months ago by - 08/2016 echo: LVEF >75%, abnormal diastolic function per report. Normal LA, normal RV. Regarding diastolic function E/A 0.9, lateral e' 9cm, E/e' 8, decel time not reported, no normal LA.      - last visit we increased lasix to 80mg  daily. Repeat labs showed low potassium and magnesium which were replaced. Mild uptrend in Cr  - limiting sodium intake - no recent SOB    2. COPD - followed by pcp   3. History of CVA - she has been on ASA and plavix   4. Recent admission with sepsis/pneumonia - admitted Jan to Feb 2019 - complicated by COPD exacerbation and AKI    Past Medical History:  Diagnosis Date  . Anxiety   . Arthritis   . CHF (congestive heart failure) (HCC)   . Chronic back pain   . Chronic pain   . COPD (chronic obstructive pulmonary disease) (HCC)   . Coronary artery disease   . Depression   . GERD (gastroesophageal reflux disease)   . Hypertension   . Migraine   . Stroke [facility])      Allergies  Allergen Reactions  . Penicillins Itching     Current Outpatient Medications  Medication Sig Dispense Refill  . albuterol (PROVENTIL HFA;VENTOLIN HFA) 108 (90 BASE) MCG/ACT inhaler Inhale 2 puffs into the lungs 2 (two) times daily.    Marland Kitchen ALPRAZolam (XANAX) 0.5 MG tablet Take 1 tablet (0.5 mg total) by mouth 2 (two) times daily as needed for anxiety. 30 tablet 2  . ALPRAZolam (XANAX) 1 MG tablet TAKE ONE TABLET BY MOUTH TWICE DAILY 60 tablet 0  . ARIPiprazole (ABILIFY) 5 MG tablet  Take 1 tablet (5 mg total) by mouth at bedtime. 30 tablet 2  . Calcium Carb-Cholecalciferol (CALCIUM 600-D PO) Take 1 tablet by mouth daily.    . clopidogrel (PLAVIX) 75 MG tablet Take 75 mg by mouth daily.    Marland Kitchen FLUoxetine (PROZAC) 20 MG capsule Take 1 capsule (20 mg total) by mouth daily. 30 capsule 2  . Fluticasone-Salmeterol (ADVAIR) 250-50 MCG/DOSE AEPB Inhale 1 puff into the lungs 2 (two) times daily.    . furosemide (LASIX) 80 MG tablet Take 1 tablet (80 mg total) by mouth daily. 90 tablet 1  . gabapentin (NEURONTIN) 600 MG tablet Take 600 mg 3 (three) times daily by mouth.  3  . Magnesium Oxide 400 (240 Mg) MG TABS Take 1 tablet (400 mg total) by mouth 2 (two) times daily. 180 tablet 1  . metoprolol succinate (TOPROL-XL) 25 MG 24 hr tablet Take 0.5 tablets (12.5 mg total) by mouth daily. Take with or immediately following a meal.    . Omega-3 Fatty Acids (FISH OIL) 1000 MG CAPS Take by mouth 2 (two) times daily.    Marland Kitchen oxyCODONE-acetaminophen (PERCOCET/ROXICET) 5-325 MG tablet Take 1 tablet by mouth every 6 (six) hours as needed for severe pain. 12 tablet 0  . pantoprazole (PROTONIX) 40 MG tablet Take 40 mg by mouth daily.    Marland Kitchen  pantoprazole (PROTONIX) 40 MG tablet Take 1 tablet (40 mg total) by mouth daily. 90 tablet 1  . potassium chloride SA (K-DUR,KLOR-CON) 20 MEQ tablet TAKE 2 TABLETS DAILY FOR 3 DAYS THEN TAKE 1 TABLET DAILY 96 tablet 1  . pravastatin (PRAVACHOL) 80 MG tablet Take 80 mg by mouth daily.     No current facility-administered medications for this visit.      Past Surgical History:  Procedure Laterality Date  . ABDOMINAL HYSTERECTOMY    . breast lump removed    . BREAST SURGERY    . CHOLECYSTECTOMY    . FOOT SURGERY       Allergies  Allergen Reactions  . Penicillins Itching      Family History  Problem Relation Age of Onset  . Bipolar disorder Daughter      Social History Ms. Brancato reports that she quit smoking about 2 months ago. Her smoking use  included cigarettes. She smoked 1.00 pack per day. She has never used smokeless tobacco. Ms. Hartin reports that she does not drink alcohol.   Review of Systems CONSTITUTIONAL: No weight loss, fever, chills, weakness or fatigue.  HEENT: Eyes: No visual loss, blurred vision, double vision or yellow sclerae.No hearing loss, sneezing, congestion, runny nose or sore throat.  SKIN: No rash or itching.  CARDIOVASCULAR: no chest pain, no palpitations.  RESPIRATORY: No shortness of breath, cough or sputum.  GASTROINTESTINAL: No anorexia, nausea, vomiting or diarrhea. No abdominal pain or blood.  GENITOURINARY: No burning on urination, no polyuria NEUROLOGICAL: No headache, dizziness, syncope, paralysis, ataxia, numbness or tingling in the extremities. No change in bowel or bladder control.  MUSCULOSKELETAL: No muscle, back pain, joint pain or stiffness.  LYMPHATICS: No enlarged nodes. No history of splenectomy.  PSYCHIATRIC: No history of depression or anxiety.  ENDOCRINOLOGIC: No reports of sweating, cold or heat intolerance. No polyuria or polydipsia.  Marland Kitchen   Physical Examination Vitals:   08/02/17 1315  BP: 113/75  Pulse: 78  SpO2: 97%   Vitals:   08/02/17 1315  Weight: 182 lb (82.6 kg)  Height: 5\' 2"  (1.575 m)    Gen: resting comfortably, no acute distress HEENT: no scleral icterus, pupils equal round and reactive, no palptable cervical adenopathy,  CV: RRR, no m/g/g, no jvd Resp: Clear to auscultation bilaterally GI: abdomen is soft, non-tender, non-distended, normal bowel sounds, no hepatosplenomegaly MSK: extremities are warm, 2-3+ bilateral LE edema Skin: warm, no rash Neuro:  no focal deficits Psych: appropriate affect   Diagnostic Studies     Assessment and Plan   1. Acute on chronic diastolic heart failure - increased edema. We will change lasix to 60mg  bid. Check BMET/Mg in 2 weeks.  - update [place] on weights next week   F/u 3 months      Antoine Poche, M.D., F.A.C.C.

## 2017-08-02 NOTE — Patient Instructions (Signed)
Your physician recommends that you schedule a follow-up appointment in: 3 MONTHS WITH DR [facility]  Your physician has recommended you make the following change in your medication:   CHANGE LASIX 60 MG TWICE DAILY  Your physician recommends that you return for lab work BMP/MG - ORDERS GIVEN TO YOU TODAY  PLEASE UPDATE [place] ON WEIGHT AND SWELLING IN 1 WEEK  Thank you for choosing [facility]!!

## 2017-08-11 ENCOUNTER — Encounter: Payer: Self-pay | Admitting: Cardiology

## 2017-08-13 ENCOUNTER — Telehealth: Payer: Self-pay | Admitting: Cardiology

## 2017-08-13 NOTE — Telephone Encounter (Signed)
Patient states that she was told to call office to report her weight.

## 2017-08-13 NOTE — Telephone Encounter (Signed)
Pt says swelling has remained the same - says her legs have been aching a lot - says she when she takes deep breaths gets a sharp pain in her chest - says this has been going on for 2 weeks now. Weight today is 182lbs

## 2017-08-15 NOTE — Telephone Encounter (Signed)
LM to return call.

## 2017-08-15 NOTE — Telephone Encounter (Signed)
Can she go ahead and get her blood work done to reassess her kidney function, will help make any additional changes to her fluid pill  Dominga Ferry MD

## 2017-08-20 ENCOUNTER — Encounter: Payer: Self-pay | Admitting: *Deleted

## 2017-08-20 NOTE — Telephone Encounter (Signed)
Pt says weight was 183lbs today - says swelling is worse and not urinating like normal. Currently taking 60 mg of lasix bid - denies any other symptoms at this time - lab work was done at [facility] (will request) and have scanned into chart - will forward to Dr Wyline Mood

## 2017-08-22 ENCOUNTER — Emergency Department (HOSPITAL_COMMUNITY): Payer: Medicare Other

## 2017-08-22 ENCOUNTER — Encounter (HOSPITAL_COMMUNITY): Payer: Self-pay | Admitting: Emergency Medicine

## 2017-08-22 ENCOUNTER — Other Ambulatory Visit: Payer: Self-pay

## 2017-08-22 ENCOUNTER — Emergency Department (HOSPITAL_COMMUNITY)
Admission: EM | Admit: 2017-08-22 | Discharge: 2017-08-22 | Disposition: A | Discharge status: Home / Self Care | Payer: Medicare Other | Attending: Emergency Medicine | Admitting: Emergency Medicine

## 2017-08-22 DIAGNOSIS — Z79899 Other long term (current) drug therapy: Secondary | ICD-10-CM | POA: Diagnosis not present

## 2017-08-22 DIAGNOSIS — J449 Chronic obstructive pulmonary disease, unspecified: Secondary | ICD-10-CM | POA: Diagnosis not present

## 2017-08-22 DIAGNOSIS — I2510 Atherosclerotic heart disease of native coronary artery without angina pectoris: Secondary | ICD-10-CM | POA: Insufficient documentation

## 2017-08-22 DIAGNOSIS — Z8673 Personal history of transient ischemic attack (TIA), and cerebral infarction without residual deficits: Secondary | ICD-10-CM | POA: Insufficient documentation

## 2017-08-22 DIAGNOSIS — I110 Hypertensive heart disease with heart failure: Secondary | ICD-10-CM | POA: Insufficient documentation

## 2017-08-22 DIAGNOSIS — F17210 Nicotine dependence, cigarettes, uncomplicated: Secondary | ICD-10-CM | POA: Diagnosis not present

## 2017-08-22 DIAGNOSIS — R0789 Other chest pain: Secondary | ICD-10-CM | POA: Insufficient documentation

## 2017-08-22 DIAGNOSIS — Z7902 Long term (current) use of antithrombotics/antiplatelets: Secondary | ICD-10-CM | POA: Insufficient documentation

## 2017-08-22 DIAGNOSIS — R609 Edema, unspecified: Secondary | ICD-10-CM | POA: Insufficient documentation

## 2017-08-22 DIAGNOSIS — I509 Heart failure, unspecified: Secondary | ICD-10-CM | POA: Insufficient documentation

## 2017-08-22 DIAGNOSIS — R790 Abnormal level of blood mineral: Secondary | ICD-10-CM | POA: Insufficient documentation

## 2017-08-22 DIAGNOSIS — R197 Diarrhea, unspecified: Secondary | ICD-10-CM | POA: Insufficient documentation

## 2017-08-22 DIAGNOSIS — R799 Abnormal finding of blood chemistry, unspecified: Secondary | ICD-10-CM | POA: Diagnosis present

## 2017-08-22 LAB — CBC WITH DIFFERENTIAL/PLATELET
Basophils Absolute: 0 10*3/uL (ref 0.0–0.1)
Basophils Relative: 0 %
EOS ABS: 0.2 10*3/uL (ref 0.0–0.7)
Eosinophils Relative: 3 %
HCT: 37.7 % (ref 36.0–46.0)
HEMOGLOBIN: 11.7 g/dL — AB (ref 12.0–15.0)
LYMPHS ABS: 3.2 10*3/uL (ref 0.7–4.0)
Lymphocytes Relative: 43 %
MCH: 28 pg (ref 26.0–34.0)
MCHC: 31 g/dL (ref 30.0–36.0)
MCV: 90.2 fL (ref 78.0–100.0)
MONO ABS: 0.7 10*3/uL (ref 0.1–1.0)
MONOS PCT: 10 %
NEUTROS PCT: 44 %
Neutro Abs: 3.1 10*3/uL (ref 1.7–7.7)
Platelets: 232 10*3/uL (ref 150–400)
RBC: 4.18 MIL/uL (ref 3.87–5.11)
RDW: 13.3 % (ref 11.5–15.5)
WBC: 7.2 10*3/uL (ref 4.0–10.5)

## 2017-08-22 LAB — BASIC METABOLIC PANEL
Anion gap: 8 (ref 5–15)
BUN: 12 mg/dL (ref 6–20)
CALCIUM: 8.6 mg/dL — AB (ref 8.9–10.3)
CO2: 25 mmol/L (ref 22–32)
CREATININE: 0.98 mg/dL (ref 0.44–1.00)
Chloride: 105 mmol/L (ref 101–111)
GFR calc Af Amer: >60 mL/min (ref >60)
GFR, EST NON AFRICAN AMERICAN: 58 mL/min — AB (ref >60)
GLUCOSE: 195 mg/dL — AB (ref 65–99)
POTASSIUM: 4 mmol/L (ref 3.5–5.1)
SODIUM: 138 mmol/L (ref 135–145)

## 2017-08-22 LAB — BRAIN NATRIURETIC PEPTIDE: B Natriuretic Peptide: 15 pg/mL (ref 0.0–100.0)

## 2017-08-22 LAB — TROPONIN I: Troponin I: <0.03 ng/mL (ref <0.03)

## 2017-08-22 LAB — MAGNESIUM: MAGNESIUM: 1.5 mg/dL — AB (ref 1.7–2.4)

## 2017-08-22 MED ORDER — MAGNESIUM SULFATE 2 GM/50ML IV SOLN
2.0000 g | Freq: Once | INTRAVENOUS | Status: AC
  Administered 2017-08-22: 2 g via INTRAVENOUS
  Filled 2017-08-22: qty 50

## 2017-08-22 MED ORDER — OXYCODONE-ACETAMINOPHEN 5-325 MG PO TABS
1.0000 | ORAL_TABLET | ORAL | Status: DC | PRN
  Administered 2017-08-22: 1 via ORAL
  Filled 2017-08-22: qty 1

## 2017-08-22 NOTE — Discharge Instructions (Signed)
Your evaluated in the emergency department after your doctor found that you had low potassium and magnesium levels.  We rechecked your blood work here and your magnesium was slightly low.  You were given some magnesium here.  You also were complaining of some pain in the chest with deep breathing.  You had some blood work EKG and chest x-ray that did not show an obvious cause of your pain.  Please follow-up with your primary care doctor and your cardiologist for continued management and return to the emergency department if any worsening symptoms.

## 2017-08-22 NOTE — ED Provider Notes (Signed)
[facility] EMERGENCY DEPARTMENT Provider Note   CSN: [id number] Arrival date & time: 08/22/17  1642     History   Chief Complaint Chief Complaint  Patient presents with  . Abnormal Lab    HPI Mariah White is a 67 y.o. female.  She states she is here because Dr. Verna Czech office sent her here because of low potassium and magnesium.  She has a history of CHF and peripheral edema and has been on escalating diuretics for months.  She says her leg swelling is about normal for her.  Her shortness of breath is also baseline.  She said for about 10 days she has had intermittent chest pain with deep breath.  She denies cough fever nausea vomiting.  She is had about 2 weeks of some diarrhea and she said she struggles with diarrhea on and off at baseline.  She states she is urinating less than normal but there is no burning or stinging.  The history is provided by the patient and a relative.  Abnormal Lab  Patient referred by:  PCP Result type: chemistry   Chemistry:    Potassium:  Low   Other abnormal chemistry result:  Magnesium low   Past Medical History:  Diagnosis Date  . Anxiety   . Arthritis   . CHF (congestive heart failure) (HCC)   . Chronic back pain   . Chronic pain   . COPD (chronic obstructive pulmonary disease) (HCC)   . Coronary artery disease   . Depression   . GERD (gastroesophageal reflux disease)   . Hypertension   . Migraine   . Stroke [facility])     Patient Active Problem List   Diagnosis Date Noted  . CHF (congestive heart failure) (HCC) 05/03/2017  . COPD (chronic obstructive pulmonary disease) (HCC) 05/03/2017  . CAD (coronary artery disease) 05/03/2017  . Essential hypertension 05/03/2017  . Stroke (HCC) 05/03/2017  . Aspiration pneumonia (HCC) 05/03/2017  . Sepsis (HCC) 05/03/2017  . AKI (acute kidney injury) (HCC) 05/03/2017  . Major depression 07/01/2014    Past Surgical History:  Procedure Laterality Date  . ABDOMINAL HYSTERECTOMY    . breast  lump removed    . BREAST SURGERY    . CHOLECYSTECTOMY    . FOOT SURGERY       OB History   None      Home Medications    Prior to Admission medications   Medication Sig Start Date End Date Taking? Authorizing Provider  albuterol (PROVENTIL HFA;VENTOLIN HFA) 108 (90 BASE) MCG/ACT inhaler Inhale 2 puffs into the lungs 2 (two) times daily.    [provider]  ALPRAZolam Prudy Feeler) 0.5 MG tablet Take 1 tablet (0.5 mg total) by mouth 2 (two) times daily as needed for anxiety. 06/06/17   Myrlene Broker, MD  ALPRAZolam Prudy Feeler) 1 MG tablet TAKE ONE TABLET BY MOUTH TWICE DAILY 07/30/17   Myrlene Broker, MD  ARIPiprazole (ABILIFY) 5 MG tablet Take 1 tablet (5 mg total) by mouth at bedtime. 06/06/17 06/06/18  Myrlene Broker, MD  Calcium Carb-Cholecalciferol (CALCIUM 600-D PO) Take 1 tablet by mouth daily.    [provider]  clopidogrel (PLAVIX) 75 MG tablet Take 75 mg by mouth daily.    [provider]  FLUoxetine (PROZAC) 20 MG capsule Take 1 capsule (20 mg total) by mouth daily. 06/06/17 06/06/18  Myrlene Broker, MD  Fluticasone-Salmeterol (ADVAIR) 250-50 MCG/DOSE AEPB Inhale 1 puff into the lungs 2 (two) times daily.  [provider]  furosemide (LASIX) 40 MG tablet Take 1.5 tablets (60 mg total) by mouth 2 (two) times daily. 08/02/17 10/31/17  Antoine Poche, MD  gabapentin (NEURONTIN) 600 MG tablet Take 600 mg 3 (three) times daily by mouth. 01/29/17   [provider]  Magnesium Oxide 400 (240 Mg) MG TABS Take 1 tablet (400 mg total) by mouth 2 (two) times daily. 07/20/17   Antoine Poche, MD  metoprolol succinate (TOPROL-XL) 25 MG 24 hr tablet Take 0.5 tablets (12.5 mg total) by mouth daily. Take with or immediately following a meal. 05/17/17   Johnson, Clanford L, MD  Omega-3 Fatty Acids (FISH OIL) 1000 MG CAPS Take by mouth 2 (two) times daily. 12/26/15   [provider]  oxyCODONE-acetaminophen (PERCOCET/ROXICET) 5-325 MG tablet Take 1  tablet by mouth every 6 (six) hours as needed for severe pain. 05/17/17   Johnson, Clanford L, MD  pantoprazole (PROTONIX) 40 MG tablet Take 1 tablet (40 mg total) by mouth daily. 07/10/17   Antoine Poche, MD  potassium chloride SA (K-DUR,KLOR-CON) 20 MEQ tablet TAKE 2 TABLETS DAILY FOR 3 DAYS THEN TAKE 1 TABLET DAILY 07/20/17   Antoine Poche, MD  pravastatin (PRAVACHOL) 80 MG tablet Take 80 mg by mouth daily.    [provider]    Family History Family History  Problem Relation Age of Onset  . Bipolar disorder Daughter     Social History Social History   Tobacco Use  . Smoking status: Current Every Day Smoker    Packs/day: 1.00    Types: Cigarettes  . Smokeless tobacco: Never Used  Substance Use Topics  . Alcohol use: No  . Drug use: No     Allergies   Penicillins   Review of Systems Review of Systems  Constitutional: Negative for chills and fever.  HENT: Negative for ear pain and sore throat.   Eyes: Negative for pain and visual disturbance.  Respiratory: Positive for shortness of breath. Negative for cough.   Cardiovascular: Positive for chest pain and leg swelling. Negative for palpitations.  Gastrointestinal: Positive for diarrhea. Negative for abdominal pain, nausea and vomiting.  Genitourinary: Negative for dysuria and hematuria.  Musculoskeletal: Negative for arthralgias and back pain.  Skin: Negative for color change and rash.  Neurological: Negative for seizures and syncope.  All other systems reviewed and are negative.    Physical Exam Updated Vital Signs BP (!) 151/77   Pulse 88   Temp 97.6 F (36.4 C) (Oral)   Resp 18   Ht 5' (1.524 m)   Wt 83.9 kg (185 lb)   SpO2 96%   BMI 36.13 kg/m   Physical Exam  Constitutional: She appears well-developed and well-nourished. No distress.  HENT:  Head: Normocephalic and atraumatic.  Mouth/Throat: Oropharynx is clear and moist.  Eyes: Conjunctivae are normal.  Neck: Neck supple.    Cardiovascular: Normal rate, regular rhythm and intact distal pulses.  No murmur heard. Pulmonary/Chest: Effort normal and breath sounds normal. No stridor. No respiratory distress. She has no wheezes.  Abdominal: Soft. There is no tenderness.  Musculoskeletal: Normal range of motion. She exhibits edema (2+ bilat to knees). She exhibits no deformity.  Neurological: She is alert. GCS eye subscore is 4. GCS verbal subscore is 5. GCS motor subscore is 6.  Skin: Skin is warm and dry. Capillary refill takes less than 2 seconds.  Psychiatric: She has a normal mood and affect.  Nursing note and vitals reviewed.  ED Treatments / Results  Labs (all labs ordered are listed, but only abnormal results are displayed) Labs Reviewed  BASIC METABOLIC PANEL - Abnormal; Notable for the following components:      Result Value   Glucose, Bld 195 (*)    Calcium 8.6 (*)    GFR calc non Af Amer 58 (*)    All other components within normal limits  CBC WITH DIFFERENTIAL/PLATELET - Abnormal; Notable for the following components:   Hemoglobin 11.7 (*)    All other components within normal limits  MAGNESIUM - Abnormal; Notable for the following components:   Magnesium 1.5 (*)    All other components within normal limits  TROPONIN I  BRAIN NATRIURETIC PEPTIDE    EKG EKG Interpretation  Date/Time:  Wednesday Aug 22 2017 17:44:26 EDT Ventricular Rate:  90 PR Interval:  170 QRS Duration: 78 QT Interval:  392 QTC Calculation: 479 R Axis:   14 Text Interpretation:  Normal sinus rhythm Nonspecific T wave abnormality Abnormal ECG similar to prior Confirmed by Meridee Score [phone]) on 08/22/2017 5:53:50 PM   Radiology Dg Chest Port 1 View  Result Date: 08/22/2017 CLINICAL DATA:  Chest pain and weakness. EXAM: PORTABLE CHEST 1 VIEW COMPARISON:  05/12/2017 FINDINGS: The heart size and mediastinal contours are within normal limits. Low bilateral lung volumes. There is no evidence of pulmonary edema,  consolidation, pneumothorax, nodule or pleural fluid. The visualized skeletal structures are unremarkable. IMPRESSION: No active disease. Electronically Signed   By: Irish Lack M.D.   On: 08/22/2017 17:49    Procedures Procedures (including critical care time)  Medications Ordered in ED Medications  magnesium sulfate IVPB 2 g 50 mL (0 g Intravenous Stopped 08/22/17 1930)     Initial Impression / Assessment and Plan / ED Course  I have reviewed the triage vital signs and the nursing notes.  Pertinent labs & imaging results that were available during my care of the patient were reviewed by me and considered in my medical decision making (see chart for details).  Clinical Course as of Aug 23 56  Wed Aug 22, 2017  1818 Patient was sent in for what sounds like a low potassium and low magnesium.  She is struggled with this in the past and is on oral medications for this.  Gassing in the setting of increasing aggressive diuresis her doctor noted these findings and sent her here for evaluation.  Here her K is now 4.0 normal but her magnesium is slightly low 1.5.  I recommended to the patient that we give her some IV repletion of her magnesium but I did not think otherwise that she would need to be admitted and she is agreeable to this.   [MB]    Clinical Course User Index [MB] Terrilee Files, MD     Final Clinical Impressions(s) / ED Diagnoses   Final diagnoses:  Low magnesium level  Atypical chest pain    ED Discharge Orders    None       Terrilee Files, MD 08/23/17 [phone]

## 2017-08-22 NOTE — Telephone Encounter (Signed)
Notified patient. Patient states she will try her best to get to the hospital today but it might have to be tomorrow. Advised patient of the importance of getting there today. Patient states she will all back to let us know her plan.

## 2017-08-22 NOTE — Telephone Encounter (Signed)
I was granted access to Dr.Branch's box today to help clean out some of the results, but they will be assigning another MD to cover the box as there are several items such as studies to read in here I do not have ability to handle.  I see a phone note from 5/20 that pt's symptoms have continued to worsen and she is not urinating like she should. Her labs would suggest extremely low magnesium which would require significant repletion. Given her symptoms including chest pain and lack of response to outpatient diuresis would recommendd she proceed to the hospital for further evaluation and possibly IV diuresis or workup for PE.

## 2017-08-22 NOTE — ED Triage Notes (Signed)
Pt has labs drawn two days ago, PCP called her today and told her to come to ED. Stated potassium and magnesium was low.

## 2017-08-22 NOTE — Telephone Encounter (Signed)
Will forward to [place].  

## 2017-08-27 ENCOUNTER — Other Ambulatory Visit (HOSPITAL_COMMUNITY): Payer: Self-pay | Admitting: Psychiatry

## 2017-09-01 ENCOUNTER — Other Ambulatory Visit (HOSPITAL_COMMUNITY): Payer: Self-pay | Admitting: Psychiatry

## 2017-09-05 ENCOUNTER — Ambulatory Visit: Payer: Self-pay | Admitting: Cardiology

## 2017-09-05 NOTE — Progress Notes (Deleted)
Clinical Summary Mariah White is a 67 y.o.female seen today for follow up of the following medical problems.  1. SOB/Chronic diasotlic HF - started 1 year ago. Can occur at rest or with activity. Can have some coughing or wheezing. Can have some chest tightness at times. Midchest, 4/10 in severity. Tightness can occur at rest or with activity. Can be positional. Lasts a few minutes - has had some recent LE edema. Started about 4 months. Started on lasix a few months ago by - 08/2016 echo: LVEF >75%, abnormal diastolic function per report. Normal LA, normal RV. Regarding diastolic function E/A 0.9, lateral e' 9cm, E/e' 8, decel time not reported, no normal LA.     - last visit we increased lasix to 80mg  daily. Repeat labs showed low potassium and magnesium which were replaced. Mild uptrend in Cr  - limiting sodium intake - no recent SOB   - last visit 08/02/17 had fluid overload, lasix changed to 60mg  bid - ER visit 08/22/17 with edema. Labs showed stable Cr, BNP 15, Mg 1.5. CXR clear.   2. Chest pain - during 08/2017 ER visit reported chest pain with deep breaths - trop neg. EKG SR without acute ischemic changes  2. COPD - followed by pcp   3. History of CVA - she has been on ASA and plavix   4. Recent admission with sepsis/pneumonia - admitted Jan to Feb 2019 - complicated by COPD exacerbation and AKI     Past Medical History:  Diagnosis Date  . Anxiety   . Arthritis   . CHF (congestive heart failure) (HCC)   . Chronic back pain   . Chronic pain   . COPD (chronic obstructive pulmonary disease) (HCC)   . Coronary artery disease   . Depression   . GERD (gastroesophageal reflux disease)   . Hypertension   . Migraine   . Stroke [facility])      Allergies  Allergen Reactions  . Penicillins Itching    Has patient had a PCN reaction causing immediate rash, facial/tongue/throat swelling, SOB or lightheadedness with hypotension: Yes Has patient had a PCN  reaction causing severe rash involving mucus membranes or skin necrosis: No Has patient had a PCN reaction that required hospitalization: No Has patient had a PCN reaction occurring within the last 10 years: No If all of the above answers are "NO", then may proceed with Cephalosporin use.      Current Outpatient Medications  Medication Sig Dispense Refill  . albuterol (PROVENTIL HFA;VENTOLIN HFA) 108 (90 BASE) MCG/ACT inhaler Inhale 2 puffs into the lungs 2 (two) times daily.    Marland Kitchen ALPRAZolam (XANAX) 0.5 MG tablet Take 1 tablet (0.5 mg total) by mouth 2 (two) times daily as needed for anxiety. (Patient not taking: Reported on 08/22/2017) 30 tablet 2  . ALPRAZolam (XANAX) 1 MG tablet TAKE 1 TABLET BY MOUTH TWICE DAILY 60 tablet 0  . amLODipine (NORVASC) 10 MG tablet Take 10 mg by mouth daily.    . ARIPiprazole (ABILIFY) 5 MG tablet Take 1 tablet (5 mg total) by mouth at bedtime. 30 tablet 2  . Calcium Carb-Cholecalciferol (CALCIUM 600-D PO) Take 1 tablet by mouth daily.    . clopidogrel (PLAVIX) 75 MG tablet Take 75 mg by mouth daily.    Marland Kitchen FLUoxetine (PROZAC) 20 MG capsule Take 1 capsule (20 mg total) by mouth daily. 30 capsule 2  . Fluticasone-Salmeterol (ADVAIR) 250-50 MCG/DOSE AEPB Inhale 1 puff into the lungs 2 (two) times daily.    Marland Kitchen  furosemide (LASIX) 40 MG tablet Take 1.5 tablets (60 mg total) by mouth 2 (two) times daily. (Patient taking differently: Take 80 mg by mouth daily. ) 270 tablet 1  . gabapentin (NEURONTIN) 600 MG tablet Take 600 mg 3 (three) times daily by mouth.  3  . lisinopril (PRINIVIL,ZESTRIL) 40 MG tablet Take 40 mg by mouth daily.    . Magnesium Oxide 400 (240 Mg) MG TABS Take 1 tablet (400 mg total) by mouth 2 (two) times daily. 180 tablet 1  . metoprolol succinate (TOPROL-XL) 25 MG 24 hr tablet Take 0.5 tablets (12.5 mg total) by mouth daily. Take with or immediately following a meal.    . Omega-3 Fatty Acids (FISH OIL) 1000 MG CAPS Take by mouth 2 (two) times daily.     Marland Kitchen oxyCODONE-acetaminophen (PERCOCET/ROXICET) 5-325 MG tablet Take 1 tablet by mouth every 6 (six) hours as needed for severe pain. 12 tablet 0  . pantoprazole (PROTONIX) 40 MG tablet Take 1 tablet (40 mg total) by mouth daily. 90 tablet 1  . potassium chloride SA (K-DUR,KLOR-CON) 20 MEQ tablet TAKE 2 TABLETS DAILY FOR 3 DAYS THEN TAKE 1 TABLET DAILY (Patient taking differently: Take 20 mEq by mouth daily. ) 96 tablet 1  . pravastatin (PRAVACHOL) 80 MG tablet Take 80 mg by mouth daily.     No current facility-administered medications for this visit.      Past Surgical History:  Procedure Laterality Date  . ABDOMINAL HYSTERECTOMY    . breast lump removed    . BREAST SURGERY    . CHOLECYSTECTOMY    . FOOT SURGERY       Allergies  Allergen Reactions  . Penicillins Itching    Has patient had a PCN reaction causing immediate rash, facial/tongue/throat swelling, SOB or lightheadedness with hypotension: Yes Has patient had a PCN reaction causing severe rash involving mucus membranes or skin necrosis: No Has patient had a PCN reaction that required hospitalization: No Has patient had a PCN reaction occurring within the last 10 years: No If all of the above answers are "NO", then may proceed with Cephalosporin use.       Family History  Problem Relation Age of Onset  . Bipolar disorder Daughter      Social History Mariah White reports that she has been smoking cigarettes.  She has been smoking about 1.00 pack per day. She has never used smokeless tobacco. Mariah White reports that she does not drink alcohol.   Review of Systems CONSTITUTIONAL: No weight loss, fever, chills, weakness or fatigue.  HEENT: Eyes: No visual loss, blurred vision, double vision or yellow sclerae.No hearing loss, sneezing, congestion, runny nose or sore throat.  SKIN: No rash or itching.  CARDIOVASCULAR:  RESPIRATORY: No shortness of breath, cough or sputum.  GASTROINTESTINAL: No anorexia, nausea,  vomiting or diarrhea. No abdominal pain or blood.  GENITOURINARY: No burning on urination, no polyuria NEUROLOGICAL: No headache, dizziness, syncope, paralysis, ataxia, numbness or tingling in the extremities. No change in bowel or bladder control.  MUSCULOSKELETAL: No muscle, back pain, joint pain or stiffness.  LYMPHATICS: No enlarged nodes. No history of splenectomy.  PSYCHIATRIC: No history of depression or anxiety.  ENDOCRINOLOGIC: No reports of sweating, cold or heat intolerance. No polyuria or polydipsia.  Marland Kitchen   Physical Examination There were no vitals filed for this visit. There were no vitals filed for this visit.  Gen: resting comfortably, no acute distress HEENT: no scleral icterus, pupils equal round and reactive, no palptable cervical  adenopathy,  CV Resp: Clear to auscultation bilaterally GI: abdomen is soft, non-tender, non-distended, normal bowel sounds, no hepatosplenomegaly MSK: extremities are warm, no edema.  Skin: warm, no rash Neuro:  no focal deficits Psych: appropriate affect   Diagnostic Studies     Assessment and Plan  1.Acute on chronic diastolic heart failure - increased edema. We will change lasix to 60mg  bid. Check BMET/Mg in 2 weeks.  - update [place] on weights next week   F/u 3 months       Antoine Poche, M.D., F.A.C.C.

## 2017-09-06 ENCOUNTER — Ambulatory Visit (HOSPITAL_COMMUNITY): Payer: Self-pay | Admitting: Psychiatry

## 2017-09-07 ENCOUNTER — Encounter (HOSPITAL_COMMUNITY): Payer: Self-pay | Admitting: Psychiatry

## 2017-09-07 ENCOUNTER — Ambulatory Visit (INDEPENDENT_AMBULATORY_CARE_PROVIDER_SITE_OTHER): Payer: Medicare Other | Admitting: Psychiatry

## 2017-09-07 VITALS — BP 137/85 | HR 85 | Ht 60.0 in | Wt 178.0 lb

## 2017-09-07 DIAGNOSIS — F17210 Nicotine dependence, cigarettes, uncomplicated: Secondary | ICD-10-CM

## 2017-09-07 DIAGNOSIS — Z818 Family history of other mental and behavioral disorders: Secondary | ICD-10-CM

## 2017-09-07 DIAGNOSIS — Z62810 Personal history of physical and sexual abuse in childhood: Secondary | ICD-10-CM

## 2017-09-07 DIAGNOSIS — F321 Major depressive disorder, single episode, moderate: Secondary | ICD-10-CM | POA: Diagnosis not present

## 2017-09-07 DIAGNOSIS — Z91410 Personal history of adult physical and sexual abuse: Secondary | ICD-10-CM

## 2017-09-07 DIAGNOSIS — Z91411 Personal history of adult psychological abuse: Secondary | ICD-10-CM | POA: Diagnosis not present

## 2017-09-07 MED ORDER — ARIPIPRAZOLE 5 MG PO TABS: 5.0000 mg | ORAL_TABLET | Freq: Every day | ORAL | 2 refills | Status: DC

## 2017-09-07 MED ORDER — FLUOXETINE HCL 20 MG PO CAPS: 20.0000 mg | ORAL_CAPSULE | Freq: Every day | ORAL | 2 refills | Status: DC

## 2017-09-07 MED ORDER — ALPRAZOLAM 1 MG PO TABS: 1.0000 mg | ORAL_TABLET | Freq: Two times a day (BID) | ORAL | 2 refills | Status: DC
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
## 2017-10-01 ENCOUNTER — Encounter: Payer: Self-pay | Admitting: Cardiology

## 2017-10-01 ENCOUNTER — Ambulatory Visit (INDEPENDENT_AMBULATORY_CARE_PROVIDER_SITE_OTHER): Payer: Medicare Other | Admitting: Cardiology

## 2017-10-01 VITALS — BP 160/85 | HR 66 | Ht 62.0 in | Wt 177.8 lb

## 2017-10-01 DIAGNOSIS — I10 Essential (primary) hypertension: Secondary | ICD-10-CM | POA: Diagnosis not present

## 2017-10-01 DIAGNOSIS — I5032 Chronic diastolic (congestive) heart failure: Secondary | ICD-10-CM | POA: Diagnosis not present

## 2017-10-01 NOTE — Progress Notes (Signed)
Clinical Summary Mariah White is a 67 y.o.female seen today for follow up of the following medical problems.This is a focused visit on her history of chronic diastolic HF and LE edema.   1. SOB/Chronic diasotlic HF - started 1 year ago. Can occur at rest or with activity. Can have some coughing or wheezing. Can have some chest tightness at times. Midchest, 4/10 in severity. Tightness can occur at rest or with activity. Can be positional. Lasts a few minutes - has had some recent LE edema. Started about 4 months. Started on lasix a few months ago by - 08/2016 echo: LVEF >75%, abnormal diastolic function per report. Normal LA, normal RV. Regarding diastolic function E/A 0.9, lateral e' 9cm, E/e' 8, decel time not reported, no normal LA.   - last visit changed lasix to 60mg  bid - had have some issues with low Mg and K with diuretics. Last Mg 08/22/17 was 1.5, K was 4 - she is taking KCl 20 once daily, magnesium oxide 400mg  bid. - LE edema is improving.        Past Medical History:  Diagnosis Date  . Anxiety   . Arthritis   . CHF (congestive heart failure) (HCC)   . Chronic back pain   . Chronic pain   . COPD (chronic obstructive pulmonary disease) (HCC)   . Coronary artery disease   . Depression   . GERD (gastroesophageal reflux disease)   . Hypertension   . Migraine   . Stroke [facility])      Allergies  Allergen Reactions  . Penicillins Itching    Has patient had a PCN reaction causing immediate rash, facial/tongue/throat swelling, SOB or lightheadedness with hypotension: Yes Has patient had a PCN reaction causing severe rash involving mucus membranes or skin necrosis: No Has patient had a PCN reaction that required hospitalization: No Has patient had a PCN reaction occurring within the last 10 years: No If all of the above answers are "NO", then may proceed with Cephalosporin use.      Current Outpatient Medications  Medication Sig Dispense Refill  . albuterol  (PROVENTIL HFA;VENTOLIN HFA) 108 (90 BASE) MCG/ACT inhaler Inhale 2 puffs into the lungs 2 (two) times daily.    Marland Kitchen ALPRAZolam (XANAX) 1 MG tablet Take 1 tablet (1 mg total) by mouth 2 (two) times daily. 60 tablet 2  . amLODipine (NORVASC) 10 MG tablet Take 10 mg by mouth daily.    . ARIPiprazole (ABILIFY) 5 MG tablet Take 1 tablet (5 mg total) by mouth at bedtime. 90 tablet 2  . Calcium Carb-Cholecalciferol (CALCIUM 600-D PO) Take 1 tablet by mouth daily.    . clopidogrel (PLAVIX) 75 MG tablet Take 75 mg by mouth daily.    Marland Kitchen FLUoxetine (PROZAC) 20 MG capsule Take 1 capsule (20 mg total) by mouth daily. 90 capsule 2  . Fluticasone-Salmeterol (ADVAIR) 250-50 MCG/DOSE AEPB Inhale 1 puff into the lungs 2 (two) times daily.    . furosemide (LASIX) 40 MG tablet Take 1.5 tablets (60 mg total) by mouth 2 (two) times daily. (Patient taking differently: Take 80 mg by mouth daily. ) 270 tablet 1  . gabapentin (NEURONTIN) 600 MG tablet Take 600 mg 3 (three) times daily by mouth.  3  . lisinopril (PRINIVIL,ZESTRIL) 40 MG tablet Take 40 mg by mouth daily.    . Magnesium Oxide 400 (240 Mg) MG TABS Take 1 tablet (400 mg total) by mouth 2 (two) times daily. 180 tablet 1  . metoprolol  succinate (TOPROL-XL) 25 MG 24 hr tablet Take 0.5 tablets (12.5 mg total) by mouth daily. Take with or immediately following a meal.    . Omega-3 Fatty Acids (FISH OIL) 1000 MG CAPS Take by mouth 2 (two) times daily.    Marland Kitchen oxyCODONE-acetaminophen (PERCOCET/ROXICET) 5-325 MG tablet Take 1 tablet by mouth every 6 (six) hours as needed for severe pain. 12 tablet 0  . pantoprazole (PROTONIX) 40 MG tablet Take 1 tablet (40 mg total) by mouth daily. 90 tablet 1  . potassium chloride SA (K-DUR,KLOR-CON) 20 MEQ tablet TAKE 2 TABLETS DAILY FOR 3 DAYS THEN TAKE 1 TABLET DAILY (Patient taking differently: Take 20 mEq by mouth daily. ) 96 tablet 1  . pravastatin (PRAVACHOL) 80 MG tablet Take 80 mg by mouth daily.     No current  facility-administered medications for this visit.      Past Surgical History:  Procedure Laterality Date  . ABDOMINAL HYSTERECTOMY    . breast lump removed    . BREAST SURGERY    . CHOLECYSTECTOMY    . FOOT SURGERY       Allergies  Allergen Reactions  . Penicillins Itching    Has patient had a PCN reaction causing immediate rash, facial/tongue/throat swelling, SOB or lightheadedness with hypotension: Yes Has patient had a PCN reaction causing severe rash involving mucus membranes or skin necrosis: No Has patient had a PCN reaction that required hospitalization: No Has patient had a PCN reaction occurring within the last 10 years: No If all of the above answers are "NO", then may proceed with Cephalosporin use.       Family History  Problem Relation Age of Onset  . Bipolar disorder Daughter      Social History Mariah White reports that she has been smoking cigarettes.  She has been smoking about 1.00 pack per day. She has never used smokeless tobacco. Mariah White reports that she does not drink alcohol.   Review of Systems CONSTITUTIONAL: No weight loss, fever, chills, weakness or fatigue.  HEENT: Eyes: No visual loss, blurred vision, double vision or yellow sclerae.No hearing loss, sneezing, congestion, runny nose or sore throat.  SKIN: No rash or itching.  CARDIOVASCULAR: no chest pain, no palpitaitons.  RESPIRATORY: No shortness of breath, cough or sputum.  GASTROINTESTINAL: No anorexia, nausea, vomiting or diarrhea. No abdominal pain or blood.  GENITOURINARY: No burning on urination, no polyuria NEUROLOGICAL: No headache, dizziness, syncope, paralysis, ataxia, numbness or tingling in the extremities. No change in bowel or bladder control.  MUSCULOSKELETAL: No muscle, back pain, joint pain or stiffness.  LYMPHATICS: No enlarged nodes. No history of splenectomy.  PSYCHIATRIC: No history of depression or anxiety.  ENDOCRINOLOGIC: No reports of sweating, cold or heat  intolerance. No polyuria or polydipsia.  Marland Kitchen   Physical Examination Vitals:   10/01/17 1558  BP: (!) 160/85  Pulse: 66  SpO2: 95%   Vitals:   10/01/17 1558  Weight: 177 lb 12.8 oz (80.6 kg)  Height: 5\' 2"  (1.575 m)    Gen: resting comfortably, no acute distress HEENT: no scleral icterus, pupils equal round and reactive, no palptable cervical adenopathy,  CV: RRR, no m/r/,g no jvd Resp: Clear to auscultation bilaterally GI: abdomen is soft, non-tender, non-distended, normal bowel sounds, no hepatosplenomegaly MSK: extremities are warm, no edema.  Skin: warm, no rash Neuro:  no focal deficits Psych: appropriate affect   Diagnostic Studies     Assessment and Plan   1.Chronic diastolic HF - swelling improving, weight  down 5 lbs since last visit.  - continue current diuretic, recheck BMET/Mg   2. HTN - first elevated bp in last several months, continue to monitor at this time.      Antoine Poche, M.D.

## 2017-10-01 NOTE — Patient Instructions (Signed)
Your physician recommends that you schedule a follow-up appointment in: 4 MONTHS WITH DR BRANCH  Your physician recommends that you continue on your current medications as directed. Please refer to the Current Medication list given to you today.  Your physician recommends that you return for lab work BMP/MG  Thank you for choosing [facility]!!    

## 2017-10-07 ENCOUNTER — Encounter: Payer: Self-pay | Admitting: Cardiology

## 2017-11-08 ENCOUNTER — Ambulatory Visit: Payer: Self-pay | Admitting: Cardiology

## 2017-11-09 ENCOUNTER — Ambulatory Visit (HOSPITAL_COMMUNITY): Payer: Self-pay | Admitting: Psychiatry

## 2017-11-09 ENCOUNTER — Ambulatory Visit: Payer: Self-pay | Admitting: Cardiology

## 2017-11-13 ENCOUNTER — Ambulatory Visit (HOSPITAL_COMMUNITY): Payer: Self-pay | Admitting: Psychiatry

## 2017-11-14 ENCOUNTER — Ambulatory Visit (INDEPENDENT_AMBULATORY_CARE_PROVIDER_SITE_OTHER): Payer: Medicare Other | Admitting: Psychiatry

## 2017-11-14 ENCOUNTER — Encounter (HOSPITAL_COMMUNITY): Payer: Self-pay | Admitting: Psychiatry

## 2017-11-14 VITALS — BP 146/90 | HR 69 | Ht 62.0 in | Wt 183.0 lb

## 2017-11-14 DIAGNOSIS — F321 Major depressive disorder, single episode, moderate: Secondary | ICD-10-CM | POA: Diagnosis not present

## 2017-11-14 DIAGNOSIS — Z818 Family history of other mental and behavioral disorders: Secondary | ICD-10-CM | POA: Diagnosis not present

## 2017-11-14 DIAGNOSIS — R5381 Other malaise: Secondary | ICD-10-CM

## 2017-11-14 DIAGNOSIS — F17210 Nicotine dependence, cigarettes, uncomplicated: Secondary | ICD-10-CM

## 2017-11-14 MED ORDER — FLUOXETINE HCL 20 MG PO CAPS: 20.0000 mg | ORAL_CAPSULE | Freq: Every day | ORAL | 2 refills | Status: DC

## 2017-11-14 MED ORDER — ARIPIPRAZOLE 5 MG PO TABS: 5.0000 mg | ORAL_TABLET | Freq: Every day | ORAL | 2 refills | Status: DC

## 2017-11-14 MED ORDER — ALPRAZOLAM 1 MG PO TABS: 1.0000 mg | ORAL_TABLET | Freq: Two times a day (BID) | ORAL | 2 refills | Status: DC
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
## 2017-12-13 ENCOUNTER — Other Ambulatory Visit: Payer: Self-pay | Admitting: Cardiology

## 2018-01-15 ENCOUNTER — Other Ambulatory Visit: Payer: Self-pay | Admitting: Cardiology

## 2018-01-15 MED ORDER — FUROSEMIDE 40 MG PO TABS: 60.0000 mg | ORAL_TABLET | Freq: Two times a day (BID) | ORAL | 6 refills | Status: DC

## 2018-02-05 ENCOUNTER — Other Ambulatory Visit (HOSPITAL_COMMUNITY): Payer: Self-pay | Admitting: Psychiatry

## 2018-02-14 ENCOUNTER — Encounter (HOSPITAL_COMMUNITY): Payer: Self-pay | Admitting: Psychiatry

## 2018-02-14 ENCOUNTER — Ambulatory Visit (INDEPENDENT_AMBULATORY_CARE_PROVIDER_SITE_OTHER): Payer: Medicare Other | Admitting: Psychiatry

## 2018-02-14 VITALS — BP 106/60 | HR 60 | Ht 62.0 in | Wt 186.0 lb

## 2018-02-14 DIAGNOSIS — F321 Major depressive disorder, single episode, moderate: Secondary | ICD-10-CM

## 2018-02-14 MED ORDER — FLUOXETINE HCL 20 MG PO CAPS: 20.0000 mg | ORAL_CAPSULE | Freq: Every day | ORAL | 2 refills | Status: DC

## 2018-02-14 MED ORDER — ARIPIPRAZOLE 5 MG PO TABS: 5.0000 mg | ORAL_TABLET | Freq: Every day | ORAL | 2 refills | Status: DC

## 2018-02-14 MED ORDER — ALPRAZOLAM 1 MG PO TABS: 1.0000 mg | ORAL_TABLET | Freq: Two times a day (BID) | ORAL | 2 refills | Status: DC
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
## 2018-02-19 ENCOUNTER — Other Ambulatory Visit: Payer: Self-pay

## 2018-02-19 MED ORDER — POTASSIUM CHLORIDE CRYS ER 20 MEQ PO TBCR: 20.0000 meq | EXTENDED_RELEASE_TABLET | Freq: Every day | ORAL | 1 refills | Status: DC

## 2018-02-19 MED ORDER — MAGNESIUM OXIDE 400 (241.3 MG) MG PO TABS: ORAL_TABLET | ORAL | 1 refills | Status: DC

## 2018-03-06 ENCOUNTER — Encounter: Payer: Self-pay | Admitting: Cardiology

## 2018-03-06 ENCOUNTER — Ambulatory Visit (INDEPENDENT_AMBULATORY_CARE_PROVIDER_SITE_OTHER): Payer: Medicare Other | Admitting: Cardiology

## 2018-03-06 VITALS — BP 104/78 | HR 61 | Ht 61.0 in | Wt 183.0 lb

## 2018-03-06 DIAGNOSIS — I5032 Chronic diastolic (congestive) heart failure: Secondary | ICD-10-CM

## 2018-03-06 DIAGNOSIS — J441 Chronic obstructive pulmonary disease with (acute) exacerbation: Secondary | ICD-10-CM | POA: Diagnosis not present

## 2018-03-06 MED ORDER — PREDNISONE 20 MG PO TABS: 40.0000 mg | ORAL_TABLET | Freq: Every day | ORAL | 0 refills | Status: DC

## 2018-03-06 MED ORDER — AZITHROMYCIN 250 MG PO TABS: ORAL_TABLET | ORAL | 0 refills | Status: DC

## 2018-03-06 NOTE — Patient Instructions (Signed)
Your physician recommends that you schedule a follow-up appointment in: 4 MONTHS WITH DR [facility]  Your physician has recommended you make the following change in your medication:   START PREDNISONE 40 MG (2 TABLETS) DAILY FOR 5 DAYS   START AZITHROMYCIN 500 MG ON DAY 1 THEN TAKE 250 MG DAILY FOR 4 DAYS  Thank you for choosing [facility]!!

## 2018-03-06 NOTE — Progress Notes (Signed)
Clinical Summary Ms. Pederson is a 67 y.o.female seen today for follow up of the following medical problems.   1. SOB/ChronicdiasotlicHF - started 1 year ago. Can occur at rest or with activity. Can have some coughing or wheezing. Can have some chest tightness at times. Midchest, 4/10 in severity. Tightness can occur at rest or with activity. Can be positional. Lasts a few minutes - has had some recent LE edema. Started about 4 months. Started on lasix a few months ago by - 08/2016 echo: LVEF >75%, abnormal diastolic function per report. Normal LA, normal RV. Regarding diastolic function E/A 0.9, lateral e' 9cm, E/e' 8, decel time not reported, no normal LA.    - admit 11/2017 with AKI and hyperkalemia, ACE-I thought to be related. Lisinopril stopped. Continued on lasix 60mg  bid.  - no recent edema. Remains on lasix 60mg  bid.   2. COPD - recent cough, wheezing. Nonproductive cough x 1 week.     3. History of CVA - she has been on ASA and plavix by another provider    Past Medical History:  Diagnosis Date  . Anxiety   . Arthritis   . CHF (congestive heart failure) (HCC)   . Chronic back pain   . Chronic pain   . COPD (chronic obstructive pulmonary disease) (HCC)   . Coronary artery disease   . Depression   . GERD (gastroesophageal reflux disease)   . Hypertension   . Migraine   . Stroke [facility])      Allergies  Allergen Reactions  . Penicillins Itching    Has patient had a PCN reaction causing immediate rash, facial/tongue/throat swelling, SOB or lightheadedness with hypotension: Yes Has patient had a PCN reaction causing severe rash involving mucus membranes or skin necrosis: No Has patient had a PCN reaction that required hospitalization: No Has patient had a PCN reaction occurring within the last 10 years: No If all of the above answers are "NO", then may proceed with Cephalosporin use.      Current Outpatient Medications  Medication Sig Dispense Refill    . albuterol (PROVENTIL HFA;VENTOLIN HFA) 108 (90 BASE) MCG/ACT inhaler Inhale 2 puffs into the lungs 2 (two) times daily.    Marland Kitchen ALPRAZolam (XANAX) 1 MG tablet Take 1 tablet (1 mg total) by mouth 2 (two) times daily. 60 tablet 2  . amLODipine (NORVASC) 10 MG tablet Take 10 mg by mouth daily.    . ARIPiprazole (ABILIFY) 5 MG tablet Take 1 tablet (5 mg total) by mouth at bedtime. 90 tablet 2  . Calcium Carb-Cholecalciferol (CALCIUM 600-D PO) Take 1 tablet by mouth daily.    . clopidogrel (PLAVIX) 75 MG tablet Take 75 mg by mouth daily.    Marland Kitchen FLUoxetine (PROZAC) 20 MG capsule Take 1 capsule (20 mg total) by mouth daily. 90 capsule 2  . Fluticasone-Salmeterol (ADVAIR) 250-50 MCG/DOSE AEPB Inhale 1 puff into the lungs 2 (two) times daily.    . furosemide (LASIX) 40 MG tablet Take 1.5 tablets (60 mg total) by mouth 2 (two) times daily. (Patient taking differently: Take 80 mg by mouth daily. ) 270 tablet 1  . furosemide (LASIX) 40 MG tablet Take 1.5 tablets (60 mg total) by mouth 2 (two) times daily. 90 tablet 6  . gabapentin (NEURONTIN) 600 MG tablet Take 600 mg 3 (three) times daily by mouth.  3  . lisinopril (PRINIVIL,ZESTRIL) 40 MG tablet Take 40 mg by mouth daily.    . magnesium oxide (MAG-OX) 400 (241.3  Mg) MG tablet TAKE 1 TABLET BY MOUTH TWICE DAILY - DOSE increase 180 tablet 1  . metoprolol succinate (TOPROL-XL) 25 MG 24 hr tablet Take 0.5 tablets (12.5 mg total) by mouth daily. Take with or immediately following a meal.    . Omega-3 Fatty Acids (FISH OIL) 1000 MG CAPS Take by mouth 2 (two) times daily.    Marland Kitchen oxyCODONE-acetaminophen (PERCOCET/ROXICET) 5-325 MG tablet Take 1 tablet by mouth every 6 (six) hours as needed for severe pain. 12 tablet 0  . pantoprazole (PROTONIX) 40 MG tablet Take 1 tablet (40 mg total) by mouth daily. 90 tablet 1  . potassium chloride SA (K-DUR,KLOR-CON) 20 MEQ tablet Take 1 tablet (20 mEq total) by mouth daily. 90 tablet 1  . pravastatin (PRAVACHOL) 80 MG tablet Take  80 mg by mouth daily.     No current facility-administered medications for this visit.      Past Surgical History:  Procedure Laterality Date  . ABDOMINAL HYSTERECTOMY    . breast lump removed    . BREAST SURGERY    . CHOLECYSTECTOMY    . FOOT SURGERY       Allergies  Allergen Reactions  . Penicillins Itching    Has patient had a PCN reaction causing immediate rash, facial/tongue/throat swelling, SOB or lightheadedness with hypotension: Yes Has patient had a PCN reaction causing severe rash involving mucus membranes or skin necrosis: No Has patient had a PCN reaction that required hospitalization: No Has patient had a PCN reaction occurring within the last 10 years: No If all of the above answers are "NO", then may proceed with Cephalosporin use.       Family History  Problem Relation Age of Onset  . Bipolar disorder Daughter      Social History Ms. Austad reports that she has been smoking cigarettes. She has been smoking about 1.00 pack per day. She has never used smokeless tobacco. Ms. Hulick reports that she does not drink alcohol.   Review of Systems CONSTITUTIONAL: No weight loss, fever, chills, weakness or fatigue.  HEENT: Eyes: No visual loss, blurred vision, double vision or yellow sclerae.No hearing loss, sneezing, congestion, runny nose or sore throat.  SKIN: No rash or itching.  CARDIOVASCULAR: no chest pain, no palpitations.  RESPIRATORY: per hpi GASTROINTESTINAL: No anorexia, nausea, vomiting or diarrhea. No abdominal pain or blood.  GENITOURINARY: No burning on urination, no polyuria NEUROLOGICAL: No headache, dizziness, syncope, paralysis, ataxia, numbness or tingling in the extremities. No change in bowel or bladder control.  MUSCULOSKELETAL: No muscle, back pain, joint pain or stiffness.  LYMPHATICS: No enlarged nodes. No history of splenectomy.  PSYCHIATRIC: No history of depression or anxiety.  ENDOCRINOLOGIC: No reports of sweating, cold or heat  intolerance. No polyuria or polydipsia.  Marland Kitchen   Physical Examination Vitals:   03/06/18 1551  BP: 104/78  Pulse: 61  SpO2: 92%   Vitals:   03/06/18 1551  Weight: 183 lb (83 kg)  Height: 5\' 1"  (1.549 m)    Gen: resting comfortably, no acute distress HEENT: no scleral icterus, pupils equal round and reactive, no palptable cervical adenopathy,  CV: RRR, no mr/g, no jvd Resp: +bilateral wheezing.  GI: abdomen is soft, non-tender, non-distended, normal bowel sounds, no hepatosplenomegaly MSK: extremities are warm, no edema.  Skin: warm, no rash Neuro:  no focal deficits Psych: appropriate affect    Assessment and Plan  1.Chronic diastolic HF - has done well on lasix 60mg  bid, euvoelmic today. Continue current meds  2. COPD exacerbation - will give 5 days of prednisone 40mg  daily, and 5 days of azithromycin   F/u 4 months   Antoine Poche, M.D.

## 2018-03-07 ENCOUNTER — Encounter: Payer: Self-pay | Admitting: Cardiology

## 2018-03-07 ENCOUNTER — Encounter: Payer: Self-pay | Admitting: *Deleted

## 2018-05-17 ENCOUNTER — Ambulatory Visit (INDEPENDENT_AMBULATORY_CARE_PROVIDER_SITE_OTHER): Payer: Medicare Other | Admitting: Psychiatry

## 2018-05-17 ENCOUNTER — Encounter (HOSPITAL_COMMUNITY): Payer: Self-pay | Admitting: Psychiatry

## 2018-05-17 VITALS — BP 103/70 | HR 70 | Ht 61.0 in | Wt 183.0 lb

## 2018-05-17 DIAGNOSIS — F321 Major depressive disorder, single episode, moderate: Secondary | ICD-10-CM

## 2018-05-17 MED ORDER — ARIPIPRAZOLE 5 MG PO TABS: 5.0000 mg | ORAL_TABLET | Freq: Every day | ORAL | 2 refills | Status: DC

## 2018-05-17 MED ORDER — ALPRAZOLAM 1 MG PO TABS: 1.0000 mg | ORAL_TABLET | Freq: Two times a day (BID) | ORAL | 2 refills | Status: DC

## 2018-05-17 MED ORDER — FLUOXETINE HCL 20 MG PO CAPS: 20.0000 mg | ORAL_CAPSULE | Freq: Every day | ORAL | 2 refills | Status: DC
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
## 2018-06-10 ENCOUNTER — Telehealth: Payer: Self-pay | Admitting: Cardiology

## 2018-06-10 MED ORDER — PANTOPRAZOLE SODIUM 40 MG PO TBEC: 40.0000 mg | DELAYED_RELEASE_TABLET | Freq: Every day | ORAL | 1 refills | Status: DC

## 2018-06-10 NOTE — Telephone Encounter (Signed)
Patient called stating that she thinks she is having indigestion but not certain. She states that she does not think she is having chest pains.

## 2018-06-10 NOTE — Telephone Encounter (Signed)
Pt requesting refill of Protonix - says she has been out of medication - denies any chest pain just some indigestion and has been crying a lot since recent death of her daughter - offered condolences and sent medication into [facility] as requested.

## 2018-06-18 ENCOUNTER — Other Ambulatory Visit: Payer: Self-pay

## 2018-06-18 MED ORDER — MAGNESIUM OXIDE 400 (241.3 MG) MG PO TABS: ORAL_TABLET | ORAL | 1 refills | Status: DC

## 2018-06-18 MED ORDER — POTASSIUM CHLORIDE CRYS ER 20 MEQ PO TBCR: 20.0000 meq | EXTENDED_RELEASE_TABLET | Freq: Every day | ORAL | 1 refills | Status: DC

## 2018-07-03 ENCOUNTER — Encounter: Payer: Self-pay | Admitting: *Deleted

## 2018-07-03 ENCOUNTER — Telehealth: Payer: Self-pay | Admitting: *Deleted

## 2018-07-03 NOTE — Telephone Encounter (Signed)
   Primary Cardiologist:  Dina Rich, MD   Patient contacted.  History reviewed.  No symptoms to suggest any unstable cardiac conditions.  Based on discussion, with current pandemic situation, we will be postponing this appointment for Mariah White with a plan for f/u on 08/19/2018 with Mariah Bon, NP.   If symptoms change, she has been instructed to contact our office.   Patient stated that she had been discharged from [facility] on 06/28/18.  Returned on 07/01/2018.  Both visits were for COPD and states she is on nebs & oxygen currently.  Notes requested from [facility] today.  Message fwd to provider for notification.    Marland Kitchen

## 2018-07-08 ENCOUNTER — Ambulatory Visit: Payer: Self-pay | Admitting: Cardiology

## 2018-07-18 ENCOUNTER — Encounter: Payer: Self-pay | Admitting: Cardiology

## 2018-08-15 ENCOUNTER — Ambulatory Visit (HOSPITAL_COMMUNITY): Payer: Medicare Other | Admitting: Psychiatry

## 2018-08-19 ENCOUNTER — Ambulatory Visit: Payer: Self-pay | Admitting: Cardiology

## 2018-09-02 ENCOUNTER — Other Ambulatory Visit (HOSPITAL_COMMUNITY): Payer: Self-pay | Admitting: Psychiatry

## 2018-09-03 ENCOUNTER — Encounter (HOSPITAL_COMMUNITY): Payer: Self-pay | Admitting: Psychiatry

## 2018-09-03 ENCOUNTER — Other Ambulatory Visit: Payer: Self-pay

## 2018-09-03 ENCOUNTER — Ambulatory Visit (INDEPENDENT_AMBULATORY_CARE_PROVIDER_SITE_OTHER): Payer: Medicare Other | Admitting: Psychiatry

## 2018-09-03 DIAGNOSIS — F321 Major depressive disorder, single episode, moderate: Secondary | ICD-10-CM

## 2018-09-03 MED ORDER — ALPRAZOLAM 1 MG PO TABS: 1.0000 mg | ORAL_TABLET | Freq: Two times a day (BID) | ORAL | 2 refills | Status: DC

## 2018-09-03 MED ORDER — ARIPIPRAZOLE 5 MG PO TABS: 5.0000 mg | ORAL_TABLET | Freq: Every day | ORAL | 2 refills | Status: DC

## 2018-09-03 MED ORDER — FLUOXETINE HCL 40 MG PO CAPS: 40.0000 mg | ORAL_CAPSULE | Freq: Every day | ORAL | 2 refills | Status: DC
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
## 2018-09-18 ENCOUNTER — Telehealth: Payer: Self-pay | Admitting: Cardiology

## 2018-09-18 NOTE — Telephone Encounter (Signed)
Virtual Visit Pre-Appointment Phone Call  "(Name), I am calling you today to discuss your upcoming appointment. We are currently trying to limit exposure to the virus that causes COVID-19 by seeing patients at home rather than in the office."  1. "What is the BEST phone number to call the day of the visit?" - include this in appointment notes  2. Do you have or have access to (through a family member/friend) a smartphone with video capability that we can use for your visit?" a. If yes - list this number in appt notes as cell (if different from BEST phone #) and list the appointment type as a VIDEO visit in appointment notes b. If no - list the appointment type as a PHONE visit in appointment notes  3. Confirm consent - "In the setting of the current Covid19 crisis, you are scheduled for a (phone or video) visit with your provider on (date) at (time).  Just as we do with many in-office visits, in order for you to participate in this visit, we must obtain consent.  If you'd like, I can send this to your mychart (if signed up) or email for you to review.  Otherwise, I can obtain your verbal consent now.  All virtual visits are billed to your insurance company just like a normal visit would be.  By agreeing to a virtual visit, we'd like you to understand that the technology does not allow for your provider to perform an examination, and thus may limit your provider's ability to fully assess your condition. If your provider identifies any concerns that need to be evaluated in person, we will make arrangements to do so.  Finally, though the technology is pretty good, we cannot assure that it will always work on either your or our end, and in the setting of a video visit, we may have to convert it to a phone-only visit.  In either situation, we cannot ensure that we have a secure connection.  Are you willing to proceed?" STAFF: Did the patient verbally acknowledge consent to telehealth visit? Document  YES/NO here: yes  4. Advise patient to be prepared - "Two hours prior to your appointment, go ahead and check your blood pressure, pulse, oxygen saturation, and your weight (if you have the equipment to check those) and write them all down. When your visit starts, your provider will ask you for this information. If you have an Apple Watch or Kardia device, please plan to have heart rate information ready on the day of your appointment. Please have a pen and paper handy nearby the day of the visit as well."  5. Give patient instructions for MyChart download to smartphone OR Doximity/Doxy.me as below if video visit (depending on what platform provider is using)  6. Inform patient they will receive a phone call 15 minutes prior to their appointment time (may be from unknown caller ID) so they should be prepared to answer    Mariah White has been deemed a candidate for a follow-up tele-health visit to limit community exposure during the Covid-19 pandemic. I spoke with the patient via phone to ensure availability of phone/video source, confirm preferred email & phone number, and discuss instructions and expectations.  I reminded Mariah White to be prepared with any vital sign and/or heart rhythm information that could potentially be obtained via home monitoring, at the time of her visit. I reminded Mariah White to expect a phone call prior to  her visit.  Mariah White 09/18/2018 4:46 PM   INSTRUCTIONS FOR DOWNLOADING THE MYCHART APP TO SMARTPHONE  - The patient must first make sure to have activated MyChart and know their login information - If Apple, go to [employer] and type in MyChart in the search bar and download the app. If Android, ask patient to go to [employer] and type in Benitez in the search bar and download the app. The app is free but as with any other app downloads, their phone may require them to verify saved payment information or Apple/Android  password.  - The patient will need to then log into the app with their MyChart username and password, and select [facility] as their healthcare provider to link the account. When it is time for your visit, go to the MyChart app, find appointments, and click Begin Video Visit. Be sure to Select Allow for your device to access the Microphone and Camera for your visit. You will then be connected, and your provider will be with you shortly.  **If they have any issues connecting, or need assistance please contact MyChart service desk [phone])**  **If using a computer, in order to ensure the best quality for their visit they will need to use either of the following Internet Browsers: [employer] or Google Chrome**  IF USING DOXIMITY or DOXY.ME - The patient will receive a link just prior to their visit by text.     FULL LENGTH CONSENT FOR TELE-HEALTH VISIT   I hereby voluntarily request, consent and authorize [facility] and its employed or contracted physicians, physician assistants, nurse practitioners or other licensed health care professionals (the Practitioner), to provide me with telemedicine health care services (the Services") as deemed necessary by the treating Practitioner. I acknowledge and consent to receive the Services by the Practitioner via telemedicine. I understand that the telemedicine visit will involve communicating with the Practitioner through live audiovisual communication technology and the disclosure of certain medical information by electronic transmission. I acknowledge that I have been given the opportunity to request an in-person assessment or other available alternative prior to the telemedicine visit and am voluntarily participating in the telemedicine visit.  I understand that I have the right to withhold or withdraw my consent to the use of telemedicine in the course of my care at any time, without affecting my right to future care or treatment,  and that the Practitioner or I may terminate the telemedicine visit at any time. I understand that I have the right to inspect all information obtained and/or recorded in the course of the telemedicine visit and may receive copies of available information for a reasonable fee.  I understand that some of the potential risks of receiving the Services via telemedicine include:   Delay or interruption in medical evaluation due to technological equipment failure or disruption;  Information transmitted may not be sufficient (e.g. poor resolution of images) to allow for appropriate medical decision making by the Practitioner; and/or   In rare instances, security protocols could fail, causing a breach of personal health information.  Furthermore, I acknowledge that it is my responsibility to provide information about my medical history, conditions and care that is complete and accurate to the best of my ability. I acknowledge that Practitioner's advice, recommendations, and/or decision may be based on factors not within their control, such as incomplete or inaccurate data provided by me or distortions of diagnostic images or specimens that may result from electronic transmissions. I  understand that the practice of medicine is not an exact science and that Practitioner makes no warranties or guarantees regarding treatment outcomes. I acknowledge that I will receive a copy of this consent concurrently upon execution via email to the email address I last provided but may also request a printed copy by calling the office of [place].    I understand that my insurance will be billed for this visit.   I have read or had this consent read to me.  I understand the contents of this consent, which adequately explains the benefits and risks of the Services being provided via telemedicine.   I have been provided ample opportunity to ask questions regarding this consent and the Services and have had my questions  answered to my satisfaction.  I give my informed consent for the services to be provided through the use of telemedicine in my medical care  By participating in this telemedicine visit I agree to the above.

## 2018-09-23 ENCOUNTER — Other Ambulatory Visit (HOSPITAL_COMMUNITY): Payer: Self-pay | Admitting: Neurology

## 2018-09-23 ENCOUNTER — Ambulatory Visit (HOSPITAL_COMMUNITY)
Admission: RE | Admit: 2018-09-23 | Discharge: 2018-09-23 | Disposition: A | Discharge status: Home / Self Care | Payer: Medicare Other | Source: Ambulatory Visit | Attending: Neurology | Admitting: Neurology

## 2018-09-23 ENCOUNTER — Other Ambulatory Visit: Payer: Self-pay

## 2018-09-23 DIAGNOSIS — M25562 Pain in left knee: Secondary | ICD-10-CM | POA: Diagnosis not present

## 2018-09-25 ENCOUNTER — Telehealth: Payer: Medicare Other | Admitting: Cardiology

## 2018-09-25 NOTE — Progress Notes (Unsigned)
{Choose 1 Note Type (Telehealth Visit or Telephone Visit):[phone]}   Date:  09/25/2018   ID:  Mariah White, DOB 09/20/1950, MRN [id number]  {Patient [phone]::"Home"} {Provider [phone]::"Home"}  PCP:  Kirstie Peri, MD  Cardiologist:  Dina Rich, MD *** Electrophysiologist:  None   Evaluation Performed:  {Choose Visit Type:[phone]::"Follow-Up Visit"}  Chief Complaint:  ***  History of Present Illness:    Mariah White is a 68 y.o. female with ***  seen today for follow up of the following medical problems.   1. SOB/ChronicdiasotlicHF - started 1 year ago. Can occur at rest or with activity. Can have some coughing or wheezing. Can have some chest tightness at times. Midchest, 4/10 in severity. Tightness can occur at rest or with activity. Can be positional. Lasts a few minutes - has had some recent LE edema. Started about 4 months. Started on lasix a few months ago by - 08/2016 echo: LVEF >75%, abnormal diastolic function per report. Normal LA, normal RV. Regarding diastolic function E/A 0.9, lateral e' 9cm, E/e' 8, decel time not reported, no normal LA.    - admit 11/2017 with AKI and hyperkalemia, ACE-I thought to be related. Lisinopril stopped. Continued on lasix 60mg  bid.  - no recent edema. Remains on lasix 60mg  bid.   2. COPD - recent cough, wheezing. Nonproductive cough x 1 week.     3. History of CVA - she has been on ASA and plavix by another provider    The patient {does/does not:200015} have symptoms concerning for COVID-19 infection (fever, chills, cough, or new shortness of breath).    Past Medical History:  Diagnosis Date  . Anxiety   . Arthritis   . CHF (congestive heart failure) (HCC)   . Chronic back pain   . Chronic pain   . COPD (chronic obstructive pulmonary disease) (HCC)   . Coronary artery disease   . Depression   . GERD (gastroesophageal reflux disease)   . Hypertension   . Migraine   . Stroke  [facility])    Past Surgical History:  Procedure Laterality Date  . ABDOMINAL HYSTERECTOMY    . breast lump removed    . BREAST SURGERY    . CHOLECYSTECTOMY    . FOOT SURGERY       No outpatient medications have been marked as taking for the 09/25/18 encounter (Appointment) with Antoine Poche, MD.     Allergies:   Penicillins   Social History   Tobacco Use  . Smoking status: Current Every Day Smoker    Packs/day: 1.00    Types: Cigarettes  . Smokeless tobacco: Never Used  . Tobacco comment: 1 pack per day   Substance Use Topics  . Alcohol use: No  . Drug use: No     Family Hx: The patient's family history includes Bipolar disorder in her daughter.  ROS:   Please see the history of present illness.    *** All other systems reviewed and are negative.   Prior CV studies:   The following studies were reviewed today:  ***  Labs/Other Tests and Data Reviewed:    EKG:  {EKG/Telemetry Strips [phone]}  Recent Labs: No results found for requested labs within last 8760 hours.   Recent Lipid Panel Lab Results  Component Value Date/Time   TRIG 389 (H) 05/11/2017 05:45 AM    Wt Readings from Last 3 Encounters:  03/06/18 183 lb (83 kg)  10/01/17 177 lb 12.8 oz (80.6 kg)  08/22/17 185 lb (83.9 kg)  Objective:    Vital Signs:  There were no vitals taken for this visit.   {HeartCare Virtual Exam (Optional):[phone]::"VITAL SIGNS:  reviewed"}  ASSESSMENT & PLAN:    1.Chronic diastolic HF - has done well on lasix 60mg  bid, euvoelmic today. Continue current meds   2. COPD exacerbation - will give 5 days of prednisone 40mg  daily, and 5 days of azithromycin     COVID-19 Education: The signs and symptoms of COVID-19 were discussed with the patient and how to seek care for testing (follow up with PCP or arrange E-visit).  ***The importance of social distancing was discussed today.  Time:   Today, I have spent *** minutes with the patient  with telehealth technology discussing the above problems.     Medication Adjustments/Labs and Tests Ordered: Current medicines are reviewed at length with the patient today.  Concerns regarding medicines are outlined above.   Tests Ordered: No orders of the defined types were placed in this encounter.   Medication Changes: No orders of the defined types were placed in this encounter.   Follow Up:  {F/U Format:[phone]} {follow JA:25053}  Signed, Carlyle Dolly, MD  09/25/2018 1:31 PM    [facility]

## 2018-10-01 ENCOUNTER — Ambulatory Visit (INDEPENDENT_AMBULATORY_CARE_PROVIDER_SITE_OTHER): Payer: Medicare Other | Admitting: Psychiatry

## 2018-10-01 ENCOUNTER — Other Ambulatory Visit: Payer: Self-pay

## 2018-10-01 ENCOUNTER — Encounter: Payer: Self-pay | Admitting: *Deleted

## 2018-10-01 ENCOUNTER — Encounter (HOSPITAL_COMMUNITY): Payer: Self-pay | Admitting: Psychiatry

## 2018-10-01 DIAGNOSIS — F321 Major depressive disorder, single episode, moderate: Secondary | ICD-10-CM | POA: Diagnosis not present

## 2018-10-01 MED ORDER — ARIPIPRAZOLE 5 MG PO TABS: 5.0000 mg | ORAL_TABLET | Freq: Every day | ORAL | 2 refills | Status: DC

## 2018-10-01 MED ORDER — FLUOXETINE HCL 40 MG PO CAPS: 40.0000 mg | ORAL_CAPSULE | Freq: Every day | ORAL | 2 refills | Status: DC

## 2018-10-01 MED ORDER — ALPRAZOLAM 1 MG PO TABS: 1.0000 mg | ORAL_TABLET | Freq: Two times a day (BID) | ORAL | 2 refills | Status: DC
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
## 2018-10-24 ENCOUNTER — Encounter: Payer: Self-pay | Admitting: Student

## 2018-10-24 ENCOUNTER — Ambulatory Visit (INDEPENDENT_AMBULATORY_CARE_PROVIDER_SITE_OTHER): Payer: Medicare Other | Admitting: Student

## 2018-10-24 ENCOUNTER — Encounter: Payer: Self-pay | Admitting: *Deleted

## 2018-10-24 ENCOUNTER — Other Ambulatory Visit: Payer: Self-pay

## 2018-10-24 VITALS — BP 121/77 | HR 58 | Temp 97.4°F | Ht 61.0 in | Wt 186.0 lb

## 2018-10-24 DIAGNOSIS — I10 Essential (primary) hypertension: Secondary | ICD-10-CM | POA: Diagnosis not present

## 2018-10-24 DIAGNOSIS — I5032 Chronic diastolic (congestive) heart failure: Secondary | ICD-10-CM

## 2018-10-24 DIAGNOSIS — Z8673 Personal history of transient ischemic attack (TIA), and cerebral infarction without residual deficits: Secondary | ICD-10-CM

## 2018-10-24 DIAGNOSIS — E785 Hyperlipidemia, unspecified: Secondary | ICD-10-CM | POA: Diagnosis not present

## 2018-10-24 NOTE — Progress Notes (Signed)
Cardiology Office Note    Date:  10/24/2018   ID:  Mariah White, DOB 08/01/1950, MRN [id number]  PCP:  Kirstie Peri, MD  Cardiologist: Dina Rich, MD    Chief Complaint  Patient presents with  . Follow-up    Lower Extremity Edema    History of Present Illness:    Mariah White is a 68 y.o. female with past medical history of chronic diastolic CHF, HTN, HLD, COPD, and prior CVA who presents to the office today for overdue follow-up.   She was last examined by Dr. Wyline Mood in 03/2018 and reported having baseline dyspnea on exertion but had been experiencing worsening coughing and wheezing for the past week.  Was taking Lasix 60 mg twice daily at that time and weight had overall been stable. She was given prescriptions for Prednisone and Azithromycin for 5 days and no changes were made to her cardiac medications.  By review of records, she was admitted to [facility] in 07/2018 for PNA and a COPD exacerbation. She was negative for COVID. Creatinine was elevated to 2.10 at the time of admission and improved to 1.32 prior to discharge. Appears Lasix was reduced to 40mg  daily with her remaining on ASA, Plavix, Amlodipine, and Toprol-XL.   In talking with the patient today, she reports overall doing well since her hospitalization. She initially experienced worsening lower extremity edema and says her weight increased to 197 lbs on her home scales but her PCP titrated her Lasix from 40mg  daily to 30mg  BID and this helped with her symptoms. Reports having lab work on Monday but is unsure of the results. Weight has now declined to 186 lbs but she is still about 5 lbs above her prior baseline. She has dyspnea on exertion in the setting of COPD but says her breathing has overall been stable. Oxygen saturations have been in the 90's on RA when checked at home. She does have supplemental oxygen she uses as needed. No recent orthopnea, PND, chest pain or palpitations.    Past Medical History:   Diagnosis Date  . Anxiety   . Arthritis   . CHF (congestive heart failure) (HCC)   . Chronic back pain   . Chronic pain   . COPD (chronic obstructive pulmonary disease) (HCC)   . Coronary artery disease   . Depression   . GERD (gastroesophageal reflux disease)   . Hypertension   . Migraine   . Stroke [facility])     Past Surgical History:  Procedure Laterality Date  . ABDOMINAL HYSTERECTOMY    . breast lump removed    . BREAST SURGERY    . CHOLECYSTECTOMY    . FOOT SURGERY      Current Medications: Outpatient Medications Prior to Visit  Medication Sig Dispense Refill  . albuterol (PROVENTIL HFA;VENTOLIN HFA) 108 (90 BASE) MCG/ACT inhaler Inhale 2 puffs into the lungs 2 (two) times daily.    Marland Kitchen ALPRAZolam (XANAX) 1 MG tablet Take 1 tablet (1 mg total) by mouth 2 (two) times daily. 60 tablet 2  . amLODipine (NORVASC) 10 MG tablet Take 10 mg by mouth daily.    . ARIPiprazole (ABILIFY) 5 MG tablet Take 1 tablet (5 mg total) by mouth at bedtime. 90 tablet 2  . Calcium Carb-Cholecalciferol (CALCIUM 600-D PO) Take 1 tablet by mouth daily.    . clopidogrel (PLAVIX) 75 MG tablet Take 75 mg by mouth daily.    Marland Kitchen FLUoxetine (PROZAC) 40 MG capsule Take 80 mg by mouth daily.    Marland Kitchen  Fluticasone-Salmeterol (ADVAIR) 250-50 MCG/DOSE AEPB Inhale 1 puff into the lungs 2 (two) times daily.    . furosemide (LASIX) 40 MG tablet Take 40 mg by mouth. Take Two 40 mg Tablets in the AM and Take One 40 mg Tablet at 2 PM Daily    . gabapentin (NEURONTIN) 600 MG tablet Take 600 mg 3 (three) times daily by mouth.  3  . magnesium oxide (MAG-OX) 400 (241.3 Mg) MG tablet TAKE 1 TABLET BY MOUTH TWICE DAILY - DOSE increase 180 tablet 1  . metoprolol succinate (TOPROL-XL) 25 MG 24 hr tablet Take 0.5 tablets (12.5 mg total) by mouth daily. Take with or immediately following a meal.    . Omega-3 Fatty Acids (FISH OIL) 1000 MG CAPS Take by mouth 2 (two) times daily.    Marland Kitchen. oxyCODONE-acetaminophen (PERCOCET/ROXICET) 5-325 MG  tablet Take 1 tablet by mouth every 6 (six) hours as needed for severe pain. 12 tablet 0  . pantoprazole (PROTONIX) 40 MG tablet Take 1 tablet (40 mg total) by mouth daily. 90 tablet 1  . pravastatin (PRAVACHOL) 80 MG tablet Take 80 mg by mouth daily.    . furosemide (LASIX) 20 MG tablet Take 40 mg by mouth daily. Takes 1.5 tablets (30 mg) BID     . azithromycin (ZITHROMAX) 250 MG tablet TAKE 2 TABLETS DAY 1 THEN TAKE 1 TABLET DAILY FOR 4 DAYS 6 each 0  . FLUoxetine (PROZAC) 40 MG capsule Take 1 capsule (40 mg total) by mouth daily. 30 capsule 2  . furosemide (LASIX) 40 MG tablet Take 1.5 tablets (60 mg total) by mouth 2 (two) times daily. (Patient taking differently: Take 80 mg by mouth daily. ) 270 tablet 1  . furosemide (LASIX) 40 MG tablet Take 1.5 tablets (60 mg total) by mouth 2 (two) times daily. (Patient taking differently: Take 40 mg by mouth daily. Take 2 Two 40 mg Tablets in the AM and Take One 40 mg Tablet at 2 PM) 90 tablet 6  . potassium chloride SA (K-DUR,KLOR-CON) 20 MEQ tablet Take 1 tablet (20 mEq total) by mouth daily. 90 tablet 1   No facility-administered medications prior to visit.      Allergies:   Penicillins   Social History   Socioeconomic History  . Marital status: Single    Spouse name: Not on file  . Number of children: Not on file  . Years of education: Not on file  . Highest education level: Not on file  Occupational History  . Not on file  Social Needs  . Financial resource strain: Not on file  . Food insecurity    Worry: Not on file    Inability: Not on file  . Transportation needs    Medical: Not on file    Non-medical: Not on file  Tobacco Use  . Smoking status: Current Every Day Smoker    Packs/day: 1.00    Types: Cigarettes  . Smokeless tobacco: Never Used  . Tobacco comment: 1 pack per day   Substance and Sexual Activity  . Alcohol use: No  . Drug use: No  . Sexual activity: Not Currently  Lifestyle  . Physical activity    Days per  week: Not on file    Minutes per session: Not on file  . Stress: Not on file  Relationships  . Social Musicianconnections    Talks on phone: Not on file    Gets together: Not on file    Attends religious service: Not on file  Active member of club or organization: Not on file    Attends meetings of clubs or organizations: Not on file    Relationship status: Not on file  Other Topics Concern  . Not on file  Social History Narrative  . Not on file     Family History:  The patient's family history includes Bipolar disorder in her daughter; Colon cancer in her sister and sister; Diabetes in her brother, brother, mother, and sister; Heart attack in her brother; Pancreatic cancer in her brother; Stroke in her father.   Review of Systems:   Please see the history of present illness.     General:  No chills, fever, night sweats or weight changes.  Cardiovascular:  No chest pain, orthopnea, palpitations, paroxysmal nocturnal dyspnea. Positive for dyspnea on exertion and lower extremity edema.  Dermatological: No rash, lesions/masses Respiratory: No cough, dyspnea Urologic: No hematuria, dysuria Abdominal:   No nausea, vomiting, diarrhea, bright red blood per rectum, melena, or hematemesis Neurologic:  No visual changes, wkns, changes in mental status. All other systems reviewed and are otherwise negative except as noted above.   Physical Exam:    VS:  BP 121/77   Pulse (!) 58   Temp (!) 97.4 F (36.3 C) (Temporal)   Ht 5\' 1"  (1.549 m)   Wt 186 lb (84.4 kg)   SpO2 92%   BMI 35.14 kg/m    General: Well developed, well nourished,female appearing in no acute distress. Head: Normocephalic, atraumatic, sclera non-icteric, no xanthomas, nares are without discharge.  Neck: No carotid bruits. JVD not elevated.  Lungs: Respirations regular and unlabored, without wheezes or rales.  Heart: Regular rate and rhythm. No S3 or S4.  No murmur, no rubs, or gallops appreciated. Abdomen: Soft,  non-tender, non-distended with normoactive bowel sounds. No hepatomegaly. No rebound/guarding. No obvious abdominal masses. Msk:  Strength and tone appear normal for age. No joint deformities or effusions. Extremities: No clubbing or cyanosis. 1+ pitting edema up to mid-shins bilaterally.  Distal pedal pulses are 2+ bilaterally. Neuro: Alert and oriented X 3. Moves all extremities spontaneously. No focal deficits noted. Psych:  Responds to questions appropriately with a normal affect. Skin: No rashes or lesions noted  Wt Readings from Last 3 Encounters:  10/24/18 186 lb (84.4 kg)  03/06/18 183 lb (83 kg)  10/01/17 177 lb 12.8 oz (80.6 kg)     Studies/Labs Reviewed:   EKG:  EKG is not ordered today.   Recent Labs: No results found for requested labs within last 8760 hours.   Lipid Panel    Component Value Date/Time   TRIG 389 (H) 05/11/2017 0545    Additional studies/ records that were reviewed today include:   Echocardiogram: 08/2016   Assessment:    1. Chronic diastolic congestive heart failure (HCC)   2. Essential hypertension   3. Hyperlipidemia LDL goal <70   4. History of CVA (cerebrovascular accident)      Plan:   In order of problems listed above:  1. Chronic Diastolic CHF - she reports worsening lower extremity edema since her Lasix dose was reduced from 60mg  BID to 40mg  daily during her admission in 07/2018. This has been titrated by her PCP over the past few weeks and she is currently on 30mg  BID. Still about 5 lbs about baseline and has 1+ pitting edema on examination. Will request recent labs obtained earlier this week. Pending results, would consider further titration to 40mg  BID. If adjusting her dose, would plan for a  repeat BMET in 2-3 weeks as creatinine peaked at 2.10 in 07/2018 but was 1.32 on 07/20/2018.   2. HTN - BP is well-controlled at 121/77 during today's visit.  - continue current medication regimen with Amlodipine 10mg  daily and Toprol-XL  12.5mg  daily.   3. HLD - followed by PCP. Continue Pravastatin 80mg  daily.   4. History of CVA - she remains on Plavix per Neurology.   Medication Adjustments/Labs and Tests Ordered: Current medicines are reviewed at length with the patient today.  Concerns regarding medicines are outlined above.  Medication changes, Labs and Tests ordered today are listed in the Patient Instructions below. Patient Instructions  Medication Instructions:  Your physician recommends that you continue on your current medications as directed. Please refer to the Current Medication list given to you today.  If you need a refill on your cardiac medications before your next appointment, please call your pharmacy.   Lab work: NONE  If you have labs (blood work) drawn today and your tests are completely normal, you will receive your results only by: Marland Kitchen MyChart Message (if you have MyChart) OR . A paper copy in the mail If you have any lab test that is abnormal or we need to change your treatment, we will call you to review the results.  Testing/Procedures: NONE   Follow-Up: At [facility], you and your health needs are our priority.  As part of our continuing mission to provide you with exceptional heart care, we have created designated Provider Care Teams.  These Care Teams include your primary Cardiologist (physician) and Advanced Practice Providers (APPs -  Physician Assistants and Nurse Practitioners) who all work together to provide you with the care you need, when you need it. You will need a follow up appointment in 3 months.  Please call our office 2 months in advance to schedule this appointment.  You may see Carlyle Dolly, MD or one of the following Advanced Practice Providers on your designated Care Team:   Bernerd Pho, PA-C [facility]) . Ermalinda Barrios, PA-C ([place])  Any Other Special Instructions Will Be Listed Below (If Applicable). Thank you for choosing [facility]!     Signed, Erma Heritage, PA-C  10/24/2018 6:39 PM    [place] [address] [place], [place] [address] Phone: [phone] Fax: [phone]

## 2018-10-24 NOTE — Patient Instructions (Signed)
Medication Instructions:  Your physician recommends that you continue on your current medications as directed. Please refer to the Current Medication list given to you today.  If you need a refill on your cardiac medications before your next appointment, please call your pharmacy.   Lab work: NONE  If you have labs (blood work) drawn today and your tests are completely normal, you will receive your results only by: . MyChart Message (if you have MyChart) OR . A paper copy in the mail If you have any lab test that is abnormal or we need to change your treatment, we will call you to review the results.  Testing/Procedures: NONE   Follow-Up: At [facility], you and your health needs are our priority.  As part of our continuing mission to provide you with exceptional heart care, we have created designated Provider Care Teams.  These Care Teams include your primary Cardiologist (physician) and Advanced Practice Providers (APPs -  Physician Assistants and Nurse Practitioners) who all work together to provide you with the care you need, when you need it. You will need a follow up appointment in 3 months.  Please call our office 2 months in advance to schedule this appointment.  You may see Branch, Jonathan, MD or one of the following Advanced Practice Providers on your designated Care Team:   Brittany Strader, PA-C ([place] [facility]) . Michele Lenze, PA-C ([place] [facility])  Any Other Special Instructions Will Be Listed Below (If Applicable). Thank you for choosing [facility]!     

## 2018-10-29 ENCOUNTER — Telehealth: Payer: Self-pay | Admitting: *Deleted

## 2018-10-29 ENCOUNTER — Other Ambulatory Visit: Payer: Self-pay | Admitting: Student

## 2018-10-29 DIAGNOSIS — Z79899 Other long term (current) drug therapy: Secondary | ICD-10-CM

## 2018-10-29 MED ORDER — FUROSEMIDE 40 MG PO TABS: 40.0000 mg | ORAL_TABLET | Freq: Two times a day (BID) | ORAL | 11 refills | Status: DC

## 2018-10-29 NOTE — Telephone Encounter (Signed)
-----   Message from Erma Heritage, [place] sent at 10/29/2018  4:10 PM EDT ----- If taking Lasix 80mg  in AM/40mg  in PM, would reduce back to 40mg  BID. She needs a repeat BMET by [place] or her PCP in 2 weeks to reassess renal function.

## 2019-01-01 ENCOUNTER — Other Ambulatory Visit: Payer: Self-pay

## 2019-01-01 ENCOUNTER — Encounter (HOSPITAL_COMMUNITY): Payer: Self-pay | Admitting: Psychiatry

## 2019-01-01 ENCOUNTER — Ambulatory Visit (INDEPENDENT_AMBULATORY_CARE_PROVIDER_SITE_OTHER): Payer: Medicare Other | Admitting: Psychiatry

## 2019-01-01 DIAGNOSIS — F321 Major depressive disorder, single episode, moderate: Secondary | ICD-10-CM

## 2019-01-01 MED ORDER — FLUOXETINE HCL 40 MG PO CAPS: 40.0000 mg | ORAL_CAPSULE | Freq: Every day | ORAL | 2 refills | Status: DC

## 2019-01-01 MED ORDER — ALPRAZOLAM 1 MG PO TABS: 1.0000 mg | ORAL_TABLET | Freq: Two times a day (BID) | ORAL | 2 refills | Status: DC

## 2019-01-01 MED ORDER — TRAZODONE HCL 50 MG PO TABS: 50.0000 mg | ORAL_TABLET | Freq: Every day | ORAL | 2 refills | Status: DC

## 2019-01-01 MED ORDER — ARIPIPRAZOLE 5 MG PO TABS: 5.0000 mg | ORAL_TABLET | Freq: Every day | ORAL | 2 refills | Status: DC

## 2019-01-01 MED ORDER — FLUOXETINE HCL 40 MG PO CAPS: 80.0000 mg | ORAL_CAPSULE | Freq: Every day | ORAL | 2 refills | Status: DC
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
## 2019-01-31 ENCOUNTER — Encounter: Payer: Self-pay | Admitting: *Deleted

## 2019-01-31 ENCOUNTER — Encounter: Payer: Self-pay | Admitting: Cardiology

## 2019-01-31 ENCOUNTER — Ambulatory Visit (INDEPENDENT_AMBULATORY_CARE_PROVIDER_SITE_OTHER): Payer: Medicare Other | Admitting: Cardiology

## 2019-01-31 ENCOUNTER — Other Ambulatory Visit: Payer: Self-pay

## 2019-01-31 VITALS — BP 79/48 | HR 52 | Ht 61.0 in | Wt 177.4 lb

## 2019-01-31 DIAGNOSIS — J441 Chronic obstructive pulmonary disease with (acute) exacerbation: Secondary | ICD-10-CM | POA: Diagnosis not present

## 2019-01-31 DIAGNOSIS — I10 Essential (primary) hypertension: Secondary | ICD-10-CM

## 2019-01-31 DIAGNOSIS — I5032 Chronic diastolic (congestive) heart failure: Secondary | ICD-10-CM

## 2019-01-31 MED ORDER — PREDNISONE 20 MG PO TABS: 40.0000 mg | ORAL_TABLET | Freq: Every day | ORAL | 0 refills | Status: AC

## 2019-01-31 NOTE — Progress Notes (Signed)
Clinical Summary Mariah White is a 68 y.o.female seen today for follow up of the following medical problems.   1. SOB/ChronicdiasotlicHF - started 1 year ago. Can occur at rest or with activity. Can have some coughing or wheezing. Can have some chest tightness at times. Midchest, 4/10 in severity. Tightness can occur at rest or with activity. Can be positional. Lasts a few minutes - has had some recent LE edema. Started about 4 months. Started on lasix a few months ago by  - 08/2016 echo: LVEF >75%, abnormal diastolic function per report. Normal LA, normal RV. Regarding diastolic function E/A 0.9, lateral e' 9cm, E/e' 8, decel time not reported, no normal LA.    - admit 11/2017 with AKI and hyperkalemia, ACE-I thought to be related. Lisinopril stopped. Continued on lasix 60mg  bid.  - no recent edema. Remains on lasix 60mg  bid.  - admit 07/2018 to [facility] with pneumonia, AKI. Lasix at that time lowered to 40mg  daily. Since then has been titrated up at outpatient appoinments.     - ongoing swelling in legs, stable. Weight got up to 190 lbs about 2 months - swelling getting better on lasix 40mg  bid. Dosing limited by renal dysfunction, she is followed by neprhology     2. COPD - ongoign wheezing, chronic - followed by pcp    3. History of CVA - she has been on ASA and plavix by another provider   4. HTN - compliant with meds  5. Weakness - recent admission at [facility] in 12/2018 - presented with slurred speech, balance issues, confusion Has outpatient f/u.   6. CKD - followed by Dr   Past Medical History:  Diagnosis Date  . Anxiety   . Arthritis   . CHF (congestive heart failure) (HCC)   . Chronic back pain   . Chronic pain   . COPD (chronic obstructive pulmonary disease) (HCC)   . Coronary artery disease   . Depression   . GERD (gastroesophageal reflux disease)   . Hypertension   . Migraine   . Stroke [facility])      Allergies   Allergen Reactions  . Penicillins Itching    Has patient had a PCN reaction causing immediate rash, facial/tongue/throat swelling, SOB or lightheadedness with hypotension: Yes Has patient had a PCN reaction causing severe rash involving mucus membranes or skin necrosis: No Has patient had a PCN reaction that required hospitalization: No Has patient had a PCN reaction occurring within the last 10 years: No If all of the above answers are "NO", then may proceed with Cephalosporin use.      Current Outpatient Medications  Medication Sig Dispense Refill  . albuterol (PROVENTIL HFA;VENTOLIN HFA) 108 (90 BASE) MCG/ACT inhaler Inhale 2 puffs into the lungs 2 (two) times daily.    [facility] ALPRAZolam (XANAX) 1 MG tablet Take 1 tablet (1 mg total) by mouth 2 (two) times daily. 60 tablet 2  . amLODipine (NORVASC) 10 MG tablet Take 10 mg by mouth daily.    . ARIPiprazole (ABILIFY) 5 MG tablet Take 1 tablet (5 mg total) by mouth at bedtime. 90 tablet 2  . Calcium Carb-Cholecalciferol (CALCIUM 600-D PO) Take 1 tablet by mouth daily.    . clopidogrel (PLAVIX) 75 MG tablet Take 75 mg by mouth daily.    01/2019 FLUoxetine (PROZAC) 40 MG capsule Take 1 capsule (40 mg total) by mouth daily. 30 capsule 2  . Fluticasone-Salmeterol (ADVAIR) 250-50 MCG/DOSE AEPB Inhale 1 puff into  the lungs 2 (two) times daily.    . furosemide (LASIX) 40 MG tablet Take 1 tablet (40 mg total) by mouth 2 (two) times daily. 60 tablet 11  . gabapentin (NEURONTIN) 600 MG tablet Take 600 mg 3 (three) times daily by mouth.  3  . magnesium oxide (MAG-OX) 400 (241.3 Mg) MG tablet TAKE 1 TABLET BY MOUTH TWICE DAILY - DOSE increase 180 tablet 1  . metoprolol succinate (TOPROL-XL) 25 MG 24 hr tablet Take 0.5 tablets (12.5 mg total) by mouth daily. Take with or immediately following a meal.    . Omega-3 Fatty Acids (FISH OIL) 1000 MG CAPS Take by mouth 2 (two) times daily.    Marland Kitchen oxyCODONE-acetaminophen (PERCOCET/ROXICET) 5-325 MG tablet Take 1 tablet by  mouth every 6 (six) hours as needed for severe pain. 12 tablet 0  . pantoprazole (PROTONIX) 40 MG tablet Take 1 tablet (40 mg total) by mouth daily. 90 tablet 1  . pravastatin (PRAVACHOL) 80 MG tablet Take 80 mg by mouth daily.    . traZODone (DESYREL) 50 MG tablet Take 1 tablet (50 mg total) by mouth at bedtime. 30 tablet 2   No current facility-administered medications for this visit.      Past Surgical History:  Procedure Laterality Date  . ABDOMINAL HYSTERECTOMY    . breast lump removed    . BREAST SURGERY    . CHOLECYSTECTOMY    . FOOT SURGERY       Allergies  Allergen Reactions  . Penicillins Itching    Has patient had a PCN reaction causing immediate rash, facial/tongue/throat swelling, SOB or lightheadedness with hypotension: Yes Has patient had a PCN reaction causing severe rash involving mucus membranes or skin necrosis: No Has patient had a PCN reaction that required hospitalization: No Has patient had a PCN reaction occurring within the last 10 years: No If all of the above answers are "NO", then may proceed with Cephalosporin use.       Family History  Problem Relation Age of Onset  . Bipolar disorder Daughter   . Diabetes Mother   . Stroke Father   . Diabetes Sister   . Diabetes Brother   . Diabetes Brother   . Colon cancer Sister   . Colon cancer Sister   . Pancreatic cancer Brother   . Heart attack Brother      Social History Mariah White reports that she has been smoking cigarettes. She has been smoking about 1.00 pack per day. She has never used smokeless tobacco. Mariah White reports no history of alcohol use.   Review of Systems CONSTITUTIONAL: No weight loss, fever, chills, weakness or fatigue.  HEENT: Eyes: No visual loss, blurred vision, double vision or yellow sclerae.No hearing loss, sneezing, congestion, runny nose or sore throat.  SKIN: No rash or itching.  CARDIOVASCULAR: per hpi RESPIRATORY: No shortness of breath, cough or sputum.   GASTROINTESTINAL: No anorexia, nausea, vomiting or diarrhea. No abdominal pain or blood.  GENITOURINARY: No burning on urination, no polyuria NEUROLOGICAL: No headache, dizziness, syncope, paralysis, ataxia, numbness or tingling in the extremities. No change in bowel or bladder control.  MUSCULOSKELETAL: No muscle, back pain, joint pain or stiffness.  LYMPHATICS: No enlarged nodes. No history of splenectomy.  PSYCHIATRIC: No history of depression or anxiety.  ENDOCRINOLOGIC: No reports of sweating, cold or heat intolerance. No polyuria or polydipsia.  Marland Kitchen   Physical Examination Today's Vitals   01/31/19 1117 01/31/19 1122  BP: (!) 83/54 (!) 79/48  Pulse: Marland Kitchen)  51 (!) 52  SpO2: 96% 93%  Weight: 177 lb 6.4 oz (80.5 kg)   Height: 5\' 1"  (1.549 m)    Body mass index is 33.52 kg/m.  Gen: resting comfortably, no acute distress HEENT: no scleral icterus, pupils equal round and reactive, no palptable cervical adenopathy,  CV: RRR, no m/r/g, no jvd Resp: severe bilateral wheezing  GI: abdomen is soft, non-tender, non-distended, normal bowel sounds, no hepatosplenomegaly MSK: extremities are warm,2+ bilateral LE edema Skin: warm, no rash Neuro:  no focal deficits Psych: appropriate affect   Assessment and Plan   1.Chronic diastolic HF  weights improving, continues to have LE edema. Diuretic dosing limited due to renal dysfunction, would defer dosing to nephrology.    2. COPD exacerbation - will give 5 days of prednisone 40mg  daily  3. HTN - manual recheck 108/60, at goal - some fatigue and mild bradycardia, no strong secondary indication for toprol. Will d/c   F/u 4 months     Antoine Poche, M.D.

## 2019-01-31 NOTE — Patient Instructions (Addendum)
Medication Instructions:   Stop Metoprolol (Toprol XL).  Begin Prednisone 40mg  daily x 5 days.  Continue all other medications.    Labwork: none  Testing/Procedures: none  Follow-Up: 4 months   Any Other Special Instructions Will Be Listed Below (If Applicable).  If you need a refill on your cardiac medications before your next appointment, please call your pharmacy.

## 2019-04-02 ENCOUNTER — Other Ambulatory Visit: Payer: Self-pay

## 2019-04-02 ENCOUNTER — Ambulatory Visit (HOSPITAL_COMMUNITY): Payer: Medicare Other | Admitting: Psychiatry

## 2019-04-10 ENCOUNTER — Other Ambulatory Visit: Payer: Self-pay | Admitting: Cardiology

## 2019-04-10 ENCOUNTER — Other Ambulatory Visit (HOSPITAL_COMMUNITY): Payer: Self-pay | Admitting: Psychiatry

## 2019-04-10 MED ORDER — FLUOXETINE HCL 40 MG PO CAPS: 40.0000 mg | ORAL_CAPSULE | Freq: Every day | ORAL | 2 refills | Status: DC

## 2019-04-10 NOTE — Telephone Encounter (Signed)
PER PROVIDER: Call pt for appt. Spoke with patient appt 1/11//2021 @ 220 pm 

## 2019-04-10 NOTE — Telephone Encounter (Signed)
Call pt for appt 

## 2019-04-14 ENCOUNTER — Other Ambulatory Visit: Payer: Self-pay

## 2019-04-14 ENCOUNTER — Encounter (HOSPITAL_COMMUNITY): Payer: Self-pay | Admitting: Psychiatry

## 2019-04-14 ENCOUNTER — Ambulatory Visit (INDEPENDENT_AMBULATORY_CARE_PROVIDER_SITE_OTHER): Payer: Medicare Other | Admitting: Psychiatry

## 2019-04-14 DIAGNOSIS — F321 Major depressive disorder, single episode, moderate: Secondary | ICD-10-CM | POA: Diagnosis not present

## 2019-04-14 MED ORDER — ARIPIPRAZOLE 5 MG PO TABS: 5.0000 mg | ORAL_TABLET | Freq: Every day | ORAL | 2 refills | Status: DC

## 2019-04-14 MED ORDER — TRAZODONE HCL 50 MG PO TABS: 50.0000 mg | ORAL_TABLET | Freq: Every day | ORAL | 2 refills | Status: DC

## 2019-04-14 MED ORDER — ALPRAZOLAM 1 MG PO TABS: 1.0000 mg | ORAL_TABLET | Freq: Every day | ORAL | 2 refills | Status: DC

## 2019-04-14 MED ORDER — FLUOXETINE HCL 40 MG PO CAPS: 40.0000 mg | ORAL_CAPSULE | Freq: Every day | ORAL | 2 refills | Status: DC
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
## 2019-04-23 DIAGNOSIS — M159 Polyosteoarthritis, unspecified: Secondary | ICD-10-CM | POA: Diagnosis not present

## 2019-04-23 DIAGNOSIS — I2510 Atherosclerotic heart disease of native coronary artery without angina pectoris: Secondary | ICD-10-CM | POA: Diagnosis not present

## 2019-04-23 DIAGNOSIS — J449 Chronic obstructive pulmonary disease, unspecified: Secondary | ICD-10-CM | POA: Diagnosis not present

## 2019-04-23 DIAGNOSIS — I509 Heart failure, unspecified: Secondary | ICD-10-CM | POA: Diagnosis not present

## 2019-05-01 DIAGNOSIS — M25569 Pain in unspecified knee: Secondary | ICD-10-CM | POA: Diagnosis not present

## 2019-05-01 DIAGNOSIS — M545 Low back pain: Secondary | ICD-10-CM | POA: Diagnosis not present

## 2019-05-01 DIAGNOSIS — M130 Polyarthritis, unspecified: Secondary | ICD-10-CM | POA: Diagnosis not present

## 2019-05-01 DIAGNOSIS — G4700 Insomnia, unspecified: Secondary | ICD-10-CM | POA: Diagnosis not present

## 2019-05-21 DIAGNOSIS — D631 Anemia in chronic kidney disease: Secondary | ICD-10-CM | POA: Diagnosis not present

## 2019-05-21 DIAGNOSIS — E559 Vitamin D deficiency, unspecified: Secondary | ICD-10-CM | POA: Diagnosis not present

## 2019-05-21 DIAGNOSIS — I2510 Atherosclerotic heart disease of native coronary artery without angina pectoris: Secondary | ICD-10-CM | POA: Diagnosis not present

## 2019-05-21 DIAGNOSIS — N1832 Chronic kidney disease, stage 3b: Secondary | ICD-10-CM | POA: Diagnosis not present

## 2019-05-21 DIAGNOSIS — R809 Proteinuria, unspecified: Secondary | ICD-10-CM | POA: Diagnosis not present

## 2019-05-21 DIAGNOSIS — M159 Polyosteoarthritis, unspecified: Secondary | ICD-10-CM | POA: Diagnosis not present

## 2019-05-21 DIAGNOSIS — Z79899 Other long term (current) drug therapy: Secondary | ICD-10-CM | POA: Diagnosis not present

## 2019-05-21 DIAGNOSIS — I509 Heart failure, unspecified: Secondary | ICD-10-CM | POA: Diagnosis not present

## 2019-05-21 NOTE — Telephone Encounter (Signed)
PER PROVIDER: Call pt for appt. Spoke with patient appt 1/11//2021 @ 220 pm

## 2019-05-23 DIAGNOSIS — E559 Vitamin D deficiency, unspecified: Secondary | ICD-10-CM | POA: Diagnosis not present

## 2019-05-23 DIAGNOSIS — I129 Hypertensive chronic kidney disease with stage 1 through stage 4 chronic kidney disease, or unspecified chronic kidney disease: Secondary | ICD-10-CM | POA: Diagnosis not present

## 2019-05-23 DIAGNOSIS — R809 Proteinuria, unspecified: Secondary | ICD-10-CM | POA: Diagnosis not present

## 2019-05-23 DIAGNOSIS — E875 Hyperkalemia: Secondary | ICD-10-CM | POA: Diagnosis not present

## 2019-05-23 DIAGNOSIS — Z79899 Other long term (current) drug therapy: Secondary | ICD-10-CM | POA: Diagnosis not present

## 2019-05-24 DIAGNOSIS — J449 Chronic obstructive pulmonary disease, unspecified: Secondary | ICD-10-CM | POA: Diagnosis not present

## 2019-05-25 DIAGNOSIS — B952 Enterococcus as the cause of diseases classified elsewhere: Secondary | ICD-10-CM | POA: Diagnosis not present

## 2019-05-25 DIAGNOSIS — I2510 Atherosclerotic heart disease of native coronary artery without angina pectoris: Secondary | ICD-10-CM | POA: Diagnosis not present

## 2019-05-25 DIAGNOSIS — R0602 Shortness of breath: Secondary | ICD-10-CM | POA: Diagnosis not present

## 2019-05-25 DIAGNOSIS — I10 Essential (primary) hypertension: Secondary | ICD-10-CM | POA: Diagnosis not present

## 2019-05-25 DIAGNOSIS — I509 Heart failure, unspecified: Secondary | ICD-10-CM | POA: Diagnosis not present

## 2019-05-25 DIAGNOSIS — I6389 Other cerebral infarction: Secondary | ICD-10-CM | POA: Diagnosis not present

## 2019-05-25 DIAGNOSIS — R262 Difficulty in walking, not elsewhere classified: Secondary | ICD-10-CM | POA: Diagnosis not present

## 2019-05-25 DIAGNOSIS — R509 Fever, unspecified: Secondary | ICD-10-CM | POA: Diagnosis not present

## 2019-05-25 DIAGNOSIS — N390 Urinary tract infection, site not specified: Secondary | ICD-10-CM | POA: Diagnosis not present

## 2019-05-25 DIAGNOSIS — J9610 Chronic respiratory failure, unspecified whether with hypoxia or hypercapnia: Secondary | ICD-10-CM | POA: Diagnosis not present

## 2019-05-25 DIAGNOSIS — G894 Chronic pain syndrome: Secondary | ICD-10-CM | POA: Diagnosis not present

## 2019-05-25 DIAGNOSIS — Z20822 Contact with and (suspected) exposure to covid-19: Secondary | ICD-10-CM | POA: Diagnosis not present

## 2019-05-25 DIAGNOSIS — R404 Transient alteration of awareness: Secondary | ICD-10-CM | POA: Diagnosis not present

## 2019-05-25 DIAGNOSIS — I5032 Chronic diastolic (congestive) heart failure: Secondary | ICD-10-CM | POA: Diagnosis not present

## 2019-05-25 DIAGNOSIS — Z9981 Dependence on supplemental oxygen: Secondary | ICD-10-CM | POA: Diagnosis not present

## 2019-05-25 DIAGNOSIS — M6281 Muscle weakness (generalized): Secondary | ICD-10-CM | POA: Diagnosis not present

## 2019-05-25 DIAGNOSIS — R52 Pain, unspecified: Secondary | ICD-10-CM | POA: Diagnosis not present

## 2019-05-25 DIAGNOSIS — Z7401 Bed confinement status: Secondary | ICD-10-CM | POA: Diagnosis not present

## 2019-05-25 DIAGNOSIS — I110 Hypertensive heart disease with heart failure: Secondary | ICD-10-CM | POA: Diagnosis not present

## 2019-05-25 DIAGNOSIS — Z880 Allergy status to penicillin: Secondary | ICD-10-CM | POA: Diagnosis not present

## 2019-05-25 DIAGNOSIS — N1832 Chronic kidney disease, stage 3b: Secondary | ICD-10-CM | POA: Diagnosis not present

## 2019-05-25 DIAGNOSIS — Z8673 Personal history of transient ischemic attack (TIA), and cerebral infarction without residual deficits: Secondary | ICD-10-CM | POA: Diagnosis not present

## 2019-05-25 DIAGNOSIS — R0689 Other abnormalities of breathing: Secondary | ICD-10-CM | POA: Diagnosis not present

## 2019-05-25 DIAGNOSIS — G4733 Obstructive sleep apnea (adult) (pediatric): Secondary | ICD-10-CM | POA: Diagnosis not present

## 2019-05-25 DIAGNOSIS — J449 Chronic obstructive pulmonary disease, unspecified: Secondary | ICD-10-CM | POA: Diagnosis not present

## 2019-05-25 DIAGNOSIS — Z7902 Long term (current) use of antithrombotics/antiplatelets: Secondary | ICD-10-CM | POA: Diagnosis not present

## 2019-05-25 DIAGNOSIS — M1380 Other specified arthritis, unspecified site: Secondary | ICD-10-CM | POA: Diagnosis not present

## 2019-05-25 DIAGNOSIS — Z743 Need for continuous supervision: Secondary | ICD-10-CM | POA: Diagnosis not present

## 2019-05-25 DIAGNOSIS — R531 Weakness: Secondary | ICD-10-CM | POA: Diagnosis not present

## 2019-05-26 ENCOUNTER — Ambulatory Visit: Payer: Medicare Other | Admitting: Cardiology

## 2019-05-26 NOTE — Progress Notes (Deleted)
Clinical Summary Ms. Hursey is a 69 y.o.female  seen today for follow up of the following medical problems.  1. SOB/ChronicdiasotlicHF - started 1 year ago. Can occur at rest or with activity. Can have some coughing or wheezing. Can have some chest tightness at times. Midchest, 4/10 in severity. Tightness can occur at rest or with activity. Can be positional. Lasts a few minutes - has had some recent LE edema. Started about 4 months. Started on lasix a few months ago by  - 08/2016 echo: LVEF >75%, abnormal diastolic function per report. Normal LA, normal RV. Regarding diastolic function E/A 0.9, lateral e' 9cm, E/e' 8, decel time not reported, no normal LA.    - admit 11/2017 with AKI and hyperkalemia, ACE-I thought to be related.Lisinopril stopped.Continued on lasix 60mg  bid. - no recent edema. Remains on lasix 60mg  bid.  - admit 07/2018 to [facility] with pneumonia, AKI. Lasix at that time lowered to 40mg  daily. Since then has been titrated up at outpatient appoinments.    - ongoing swelling in legs, stable. Weight got up to 190 lbs about 2 months - swelling getting better on lasix 40mg  bid. Dosing limited by renal dysfunction, she is followed by neprhology     2. COPD - ongoign wheezing, chronic - followed by pcp    3. History of CVA - she has been on ASA and plavixby another provider   4. HTN - compliant with meds  5. Weakness - recent admission at [facility] in 12/2018 - presented with slurred speech, balance issues, confusion Has outpatient f/u.   6. CKD - followed by Dr   Past Medical History:  Diagnosis Date  . Anxiety   . Arthritis   . CHF (congestive heart failure) (HCC)   . Chronic back pain   . Chronic pain   . COPD (chronic obstructive pulmonary disease) (HCC)   . Coronary artery disease   . Depression   . GERD (gastroesophageal reflux disease)   . Hypertension   . Migraine   . Stroke [facility])       Allergies  Allergen Reactions  . Penicillins Itching    Has patient had a PCN reaction causing immediate rash, facial/tongue/throat swelling, SOB or lightheadedness with hypotension: Yes Has patient had a PCN reaction causing severe rash involving mucus membranes or skin necrosis: No Has patient had a PCN reaction that required hospitalization: No Has patient had a PCN reaction occurring within the last 10 years: No If all of the above answers are "NO", then may proceed with Cephalosporin use.      Current Outpatient Medications  Medication Sig Dispense Refill  . albuterol (PROVENTIL HFA;VENTOLIN HFA) 108 (90 BASE) MCG/ACT inhaler Inhale 2 puffs into the lungs 2 (two) times daily.    [facility] ALPRAZolam (XANAX) 1 MG tablet Take 1 tablet (1 mg total) by mouth at bedtime. 30 tablet 2  . amLODipine (NORVASC) 5 MG tablet Take 1 tablet by mouth daily.    . ARIPiprazole (ABILIFY) 5 MG tablet Take 1 tablet (5 mg total) by mouth at bedtime. 90 tablet 2  . aspirin EC 81 MG tablet Take 81 mg by mouth daily.    . clopidogrel (PLAVIX) 75 MG tablet Take 75 mg by mouth daily.    . diclofenac sodium (VOLTAREN) 1 % GEL Apply 1 application topically every 4 (four) hours as needed.    01/2019 FLUoxetine (PROZAC) 40 MG capsule Take 1 capsule (40 mg total) by mouth daily. 30  capsule 2  . furosemide (LASIX) 40 MG tablet Take 1 tablet (40 mg total) by mouth 2 (two) times daily. 60 tablet 11  . gabapentin (NEURONTIN) 600 MG tablet Take 600 mg 3 (three) times daily by mouth.  3  . ipratropium-albuterol (DUONEB) 0.5-2.5 (3) MG/3ML SOLN Inhale 3 mLs into the lungs every 6 (six) hours as needed.    . magnesium oxide (MAG-OX) 400 (241.3 Mg) MG tablet TAKE 1 TABLET BY MOUTH TWICE DAILY 180 tablet 2  . oxyCODONE-acetaminophen (PERCOCET) 10-325 MG tablet Take 1 tablet by mouth every 8 (eight) hours as needed for pain.    . pravastatin (PRAVACHOL) 80 MG tablet Take 80 mg by mouth daily.    Marland Kitchen spironolactone (ALDACTONE) 25 MG  tablet Take 25 mg by mouth daily.    . traZODone (DESYREL) 50 MG tablet Take 1 tablet (50 mg total) by mouth at bedtime. 30 tablet 2   No current facility-administered medications for this visit.     Past Surgical History:  Procedure Laterality Date  . ABDOMINAL HYSTERECTOMY    . breast lump removed    . BREAST SURGERY    . CHOLECYSTECTOMY    . FOOT SURGERY       Allergies  Allergen Reactions  . Penicillins Itching    Has patient had a PCN reaction causing immediate rash, facial/tongue/throat swelling, SOB or lightheadedness with hypotension: Yes Has patient had a PCN reaction causing severe rash involving mucus membranes or skin necrosis: No Has patient had a PCN reaction that required hospitalization: No Has patient had a PCN reaction occurring within the last 10 years: No If all of the above answers are "NO", then may proceed with Cephalosporin use.       Family History  Problem Relation Age of Onset  . Bipolar disorder Daughter   . Diabetes Mother   . Stroke Father   . Diabetes Sister   . Diabetes Brother   . Diabetes Brother   . Colon cancer Sister   . Colon cancer Sister   . Pancreatic cancer Brother   . Heart attack Brother      Social History Ms. Bonus reports that she has been smoking cigarettes. She started smoking about 60 years ago. She has been smoking about 1.00 pack per day. She has never used smokeless tobacco. Ms. Barrantes reports no history of alcohol use.   Review of Systems CONSTITUTIONAL: No weight loss, fever, chills, weakness or fatigue.  HEENT: Eyes: No visual loss, blurred vision, double vision or yellow sclerae.No hearing loss, sneezing, congestion, runny nose or sore throat.  SKIN: No rash or itching.  CARDIOVASCULAR:  RESPIRATORY: No shortness of breath, cough or sputum.  GASTROINTESTINAL: No anorexia, nausea, vomiting or diarrhea. No abdominal pain or blood.  GENITOURINARY: No burning on urination, no polyuria NEUROLOGICAL: No  headache, dizziness, syncope, paralysis, ataxia, numbness or tingling in the extremities. No change in bowel or bladder control.  MUSCULOSKELETAL: No muscle, back pain, joint pain or stiffness.  LYMPHATICS: No enlarged nodes. No history of splenectomy.  PSYCHIATRIC: No history of depression or anxiety.  ENDOCRINOLOGIC: No reports of sweating, cold or heat intolerance. No polyuria or polydipsia.  Marland Kitchen   Physical Examination There were no vitals filed for this visit. There were no vitals filed for this visit.  Gen: resting comfortably, no acute distress HEENT: no scleral icterus, pupils equal round and reactive, no palptable cervical adenopathy,  CV Resp: Clear to auscultation bilaterally GI: abdomen is soft, non-tender, non-distended, normal bowel sounds,  no hepatosplenomegaly MSK: extremities are warm, no edema.  Skin: warm, no rash Neuro:  no focal deficits Psych: appropriate affect   Diagnostic Studies     Assessment and Plan  1.Chronic diastolic HF  weights improving, continues to have LE edema. Diuretic dosing limited due to renal dysfunction, would defer dosing to nephrology.    2. COPD exacerbation - will give 5 days of prednisone 40mg  daily  3. HTN - manual recheck 108/60, at goal - some fatigue and mild bradycardia, no strong secondary indication for toprol. Will d/c   F/u 4 months      Arnoldo Lenis, M.D., F.A.C.C.

## 2019-06-02 DIAGNOSIS — N390 Urinary tract infection, site not specified: Secondary | ICD-10-CM | POA: Diagnosis not present

## 2019-06-02 DIAGNOSIS — I110 Hypertensive heart disease with heart failure: Secondary | ICD-10-CM | POA: Diagnosis not present

## 2019-06-02 DIAGNOSIS — B952 Enterococcus as the cause of diseases classified elsewhere: Secondary | ICD-10-CM | POA: Diagnosis not present

## 2019-06-02 DIAGNOSIS — I2510 Atherosclerotic heart disease of native coronary artery without angina pectoris: Secondary | ICD-10-CM | POA: Diagnosis not present

## 2019-06-02 DIAGNOSIS — G894 Chronic pain syndrome: Secondary | ICD-10-CM | POA: Diagnosis not present

## 2019-06-02 DIAGNOSIS — G4733 Obstructive sleep apnea (adult) (pediatric): Secondary | ICD-10-CM | POA: Diagnosis not present

## 2019-06-02 DIAGNOSIS — Z20822 Contact with and (suspected) exposure to covid-19: Secondary | ICD-10-CM | POA: Diagnosis not present

## 2019-06-02 DIAGNOSIS — M6281 Muscle weakness (generalized): Secondary | ICD-10-CM | POA: Diagnosis not present

## 2019-06-02 DIAGNOSIS — R262 Difficulty in walking, not elsewhere classified: Secondary | ICD-10-CM | POA: Diagnosis not present

## 2019-06-02 DIAGNOSIS — I5033 Acute on chronic diastolic (congestive) heart failure: Secondary | ICD-10-CM | POA: Diagnosis not present

## 2019-06-02 DIAGNOSIS — Z8673 Personal history of transient ischemic attack (TIA), and cerebral infarction without residual deficits: Secondary | ICD-10-CM | POA: Diagnosis not present

## 2019-06-02 DIAGNOSIS — I10 Essential (primary) hypertension: Secondary | ICD-10-CM | POA: Diagnosis not present

## 2019-06-02 DIAGNOSIS — N1832 Chronic kidney disease, stage 3b: Secondary | ICD-10-CM | POA: Diagnosis not present

## 2019-06-02 DIAGNOSIS — J449 Chronic obstructive pulmonary disease, unspecified: Secondary | ICD-10-CM | POA: Diagnosis not present

## 2019-06-02 DIAGNOSIS — M1712 Unilateral primary osteoarthritis, left knee: Secondary | ICD-10-CM | POA: Diagnosis not present

## 2019-06-02 DIAGNOSIS — Z7902 Long term (current) use of antithrombotics/antiplatelets: Secondary | ICD-10-CM | POA: Diagnosis not present

## 2019-06-02 DIAGNOSIS — Z9981 Dependence on supplemental oxygen: Secondary | ICD-10-CM | POA: Diagnosis not present

## 2019-06-02 DIAGNOSIS — R531 Weakness: Secondary | ICD-10-CM | POA: Diagnosis not present

## 2019-06-02 DIAGNOSIS — I6389 Other cerebral infarction: Secondary | ICD-10-CM | POA: Diagnosis not present

## 2019-06-02 DIAGNOSIS — R52 Pain, unspecified: Secondary | ICD-10-CM | POA: Diagnosis not present

## 2019-06-02 DIAGNOSIS — Z7401 Bed confinement status: Secondary | ICD-10-CM | POA: Diagnosis not present

## 2019-06-02 DIAGNOSIS — I5032 Chronic diastolic (congestive) heart failure: Secondary | ICD-10-CM | POA: Diagnosis not present

## 2019-06-02 DIAGNOSIS — M1380 Other specified arthritis, unspecified site: Secondary | ICD-10-CM | POA: Diagnosis not present

## 2019-06-02 DIAGNOSIS — Z880 Allergy status to penicillin: Secondary | ICD-10-CM | POA: Diagnosis not present

## 2019-06-02 DIAGNOSIS — J9610 Chronic respiratory failure, unspecified whether with hypoxia or hypercapnia: Secondary | ICD-10-CM | POA: Diagnosis not present

## 2019-06-04 DIAGNOSIS — I5033 Acute on chronic diastolic (congestive) heart failure: Secondary | ICD-10-CM | POA: Diagnosis not present

## 2019-06-04 DIAGNOSIS — I10 Essential (primary) hypertension: Secondary | ICD-10-CM | POA: Diagnosis not present

## 2019-06-04 DIAGNOSIS — J449 Chronic obstructive pulmonary disease, unspecified: Secondary | ICD-10-CM | POA: Diagnosis not present

## 2019-06-04 DIAGNOSIS — I2510 Atherosclerotic heart disease of native coronary artery without angina pectoris: Secondary | ICD-10-CM | POA: Diagnosis not present

## 2019-06-11 DIAGNOSIS — I2510 Atherosclerotic heart disease of native coronary artery without angina pectoris: Secondary | ICD-10-CM | POA: Diagnosis not present

## 2019-06-11 DIAGNOSIS — I5033 Acute on chronic diastolic (congestive) heart failure: Secondary | ICD-10-CM | POA: Diagnosis not present

## 2019-06-11 DIAGNOSIS — J449 Chronic obstructive pulmonary disease, unspecified: Secondary | ICD-10-CM | POA: Diagnosis not present

## 2019-06-11 DIAGNOSIS — I10 Essential (primary) hypertension: Secondary | ICD-10-CM | POA: Diagnosis not present

## 2019-06-18 DIAGNOSIS — J449 Chronic obstructive pulmonary disease, unspecified: Secondary | ICD-10-CM | POA: Diagnosis not present

## 2019-06-18 DIAGNOSIS — I10 Essential (primary) hypertension: Secondary | ICD-10-CM | POA: Diagnosis not present

## 2019-06-19 DIAGNOSIS — G894 Chronic pain syndrome: Secondary | ICD-10-CM | POA: Diagnosis not present

## 2019-06-19 DIAGNOSIS — J449 Chronic obstructive pulmonary disease, unspecified: Secondary | ICD-10-CM | POA: Diagnosis not present

## 2019-06-19 DIAGNOSIS — I2510 Atherosclerotic heart disease of native coronary artery without angina pectoris: Secondary | ICD-10-CM | POA: Diagnosis not present

## 2019-06-24 DIAGNOSIS — M1380 Other specified arthritis, unspecified site: Secondary | ICD-10-CM | POA: Diagnosis not present

## 2019-06-24 DIAGNOSIS — I5033 Acute on chronic diastolic (congestive) heart failure: Secondary | ICD-10-CM | POA: Diagnosis not present

## 2019-06-24 DIAGNOSIS — Z8673 Personal history of transient ischemic attack (TIA), and cerebral infarction without residual deficits: Secondary | ICD-10-CM | POA: Diagnosis not present

## 2019-06-24 DIAGNOSIS — N1832 Chronic kidney disease, stage 3b: Secondary | ICD-10-CM | POA: Diagnosis not present

## 2019-06-24 DIAGNOSIS — I2510 Atherosclerotic heart disease of native coronary artery without angina pectoris: Secondary | ICD-10-CM | POA: Diagnosis not present

## 2019-06-24 DIAGNOSIS — G4733 Obstructive sleep apnea (adult) (pediatric): Secondary | ICD-10-CM | POA: Diagnosis not present

## 2019-06-24 DIAGNOSIS — Z9981 Dependence on supplemental oxygen: Secondary | ICD-10-CM | POA: Diagnosis not present

## 2019-06-24 DIAGNOSIS — I130 Hypertensive heart and chronic kidney disease with heart failure and stage 1 through stage 4 chronic kidney disease, or unspecified chronic kidney disease: Secondary | ICD-10-CM | POA: Diagnosis not present

## 2019-06-24 DIAGNOSIS — J440 Chronic obstructive pulmonary disease with acute lower respiratory infection: Secondary | ICD-10-CM | POA: Diagnosis not present

## 2019-06-24 DIAGNOSIS — Z87440 Personal history of urinary (tract) infections: Secondary | ICD-10-CM | POA: Diagnosis not present

## 2019-06-24 DIAGNOSIS — J9610 Chronic respiratory failure, unspecified whether with hypoxia or hypercapnia: Secondary | ICD-10-CM | POA: Diagnosis not present

## 2019-06-24 DIAGNOSIS — Z7902 Long term (current) use of antithrombotics/antiplatelets: Secondary | ICD-10-CM | POA: Diagnosis not present

## 2019-06-24 DIAGNOSIS — J189 Pneumonia, unspecified organism: Secondary | ICD-10-CM | POA: Diagnosis not present

## 2019-06-24 DIAGNOSIS — M6281 Muscle weakness (generalized): Secondary | ICD-10-CM | POA: Diagnosis not present

## 2019-06-24 DIAGNOSIS — G8929 Other chronic pain: Secondary | ICD-10-CM | POA: Diagnosis not present

## 2019-06-24 DIAGNOSIS — M545 Low back pain: Secondary | ICD-10-CM | POA: Diagnosis not present

## 2019-06-25 ENCOUNTER — Telehealth (HOSPITAL_COMMUNITY): Payer: Self-pay | Admitting: *Deleted

## 2019-06-25 NOTE — Telephone Encounter (Signed)
??   About next appointment :: 07/16/2019

## 2019-06-27 DIAGNOSIS — M545 Low back pain: Secondary | ICD-10-CM | POA: Diagnosis not present

## 2019-06-27 DIAGNOSIS — Z299 Encounter for prophylactic measures, unspecified: Secondary | ICD-10-CM | POA: Diagnosis not present

## 2019-06-27 DIAGNOSIS — G4733 Obstructive sleep apnea (adult) (pediatric): Secondary | ICD-10-CM | POA: Diagnosis not present

## 2019-06-27 DIAGNOSIS — G8929 Other chronic pain: Secondary | ICD-10-CM | POA: Diagnosis not present

## 2019-06-27 DIAGNOSIS — Z8673 Personal history of transient ischemic attack (TIA), and cerebral infarction without residual deficits: Secondary | ICD-10-CM | POA: Diagnosis not present

## 2019-06-27 DIAGNOSIS — N1832 Chronic kidney disease, stage 3b: Secondary | ICD-10-CM | POA: Diagnosis not present

## 2019-06-27 DIAGNOSIS — E1165 Type 2 diabetes mellitus with hyperglycemia: Secondary | ICD-10-CM | POA: Diagnosis not present

## 2019-06-27 DIAGNOSIS — Z87440 Personal history of urinary (tract) infections: Secondary | ICD-10-CM | POA: Diagnosis not present

## 2019-06-27 DIAGNOSIS — I5033 Acute on chronic diastolic (congestive) heart failure: Secondary | ICD-10-CM | POA: Diagnosis not present

## 2019-06-27 DIAGNOSIS — J449 Chronic obstructive pulmonary disease, unspecified: Secondary | ICD-10-CM | POA: Diagnosis not present

## 2019-06-27 DIAGNOSIS — J9610 Chronic respiratory failure, unspecified whether with hypoxia or hypercapnia: Secondary | ICD-10-CM | POA: Diagnosis not present

## 2019-06-27 DIAGNOSIS — I130 Hypertensive heart and chronic kidney disease with heart failure and stage 1 through stage 4 chronic kidney disease, or unspecified chronic kidney disease: Secondary | ICD-10-CM | POA: Diagnosis not present

## 2019-06-27 DIAGNOSIS — Z7902 Long term (current) use of antithrombotics/antiplatelets: Secondary | ICD-10-CM | POA: Diagnosis not present

## 2019-06-27 DIAGNOSIS — Z9981 Dependence on supplemental oxygen: Secondary | ICD-10-CM | POA: Diagnosis not present

## 2019-06-27 DIAGNOSIS — J189 Pneumonia, unspecified organism: Secondary | ICD-10-CM | POA: Diagnosis not present

## 2019-06-27 DIAGNOSIS — I739 Peripheral vascular disease, unspecified: Secondary | ICD-10-CM | POA: Diagnosis not present

## 2019-06-27 DIAGNOSIS — M1380 Other specified arthritis, unspecified site: Secondary | ICD-10-CM | POA: Diagnosis not present

## 2019-06-27 DIAGNOSIS — M6281 Muscle weakness (generalized): Secondary | ICD-10-CM | POA: Diagnosis not present

## 2019-06-27 DIAGNOSIS — J440 Chronic obstructive pulmonary disease with acute lower respiratory infection: Secondary | ICD-10-CM | POA: Diagnosis not present

## 2019-06-27 DIAGNOSIS — I2510 Atherosclerotic heart disease of native coronary artery without angina pectoris: Secondary | ICD-10-CM | POA: Diagnosis not present

## 2019-06-30 DIAGNOSIS — Z8673 Personal history of transient ischemic attack (TIA), and cerebral infarction without residual deficits: Secondary | ICD-10-CM | POA: Diagnosis not present

## 2019-06-30 DIAGNOSIS — Z87440 Personal history of urinary (tract) infections: Secondary | ICD-10-CM | POA: Diagnosis not present

## 2019-06-30 DIAGNOSIS — I2510 Atherosclerotic heart disease of native coronary artery without angina pectoris: Secondary | ICD-10-CM | POA: Diagnosis not present

## 2019-06-30 DIAGNOSIS — I5033 Acute on chronic diastolic (congestive) heart failure: Secondary | ICD-10-CM | POA: Diagnosis not present

## 2019-06-30 DIAGNOSIS — I130 Hypertensive heart and chronic kidney disease with heart failure and stage 1 through stage 4 chronic kidney disease, or unspecified chronic kidney disease: Secondary | ICD-10-CM | POA: Diagnosis not present

## 2019-06-30 DIAGNOSIS — M6281 Muscle weakness (generalized): Secondary | ICD-10-CM | POA: Diagnosis not present

## 2019-06-30 DIAGNOSIS — J440 Chronic obstructive pulmonary disease with acute lower respiratory infection: Secondary | ICD-10-CM | POA: Diagnosis not present

## 2019-06-30 DIAGNOSIS — M1380 Other specified arthritis, unspecified site: Secondary | ICD-10-CM | POA: Diagnosis not present

## 2019-06-30 DIAGNOSIS — J189 Pneumonia, unspecified organism: Secondary | ICD-10-CM | POA: Diagnosis not present

## 2019-06-30 DIAGNOSIS — G8929 Other chronic pain: Secondary | ICD-10-CM | POA: Diagnosis not present

## 2019-06-30 DIAGNOSIS — G4733 Obstructive sleep apnea (adult) (pediatric): Secondary | ICD-10-CM | POA: Diagnosis not present

## 2019-06-30 DIAGNOSIS — Z9981 Dependence on supplemental oxygen: Secondary | ICD-10-CM | POA: Diagnosis not present

## 2019-06-30 DIAGNOSIS — M545 Low back pain: Secondary | ICD-10-CM | POA: Diagnosis not present

## 2019-06-30 DIAGNOSIS — N1832 Chronic kidney disease, stage 3b: Secondary | ICD-10-CM | POA: Diagnosis not present

## 2019-06-30 DIAGNOSIS — Z7902 Long term (current) use of antithrombotics/antiplatelets: Secondary | ICD-10-CM | POA: Diagnosis not present

## 2019-06-30 DIAGNOSIS — J9610 Chronic respiratory failure, unspecified whether with hypoxia or hypercapnia: Secondary | ICD-10-CM | POA: Diagnosis not present

## 2019-07-02 DIAGNOSIS — G4700 Insomnia, unspecified: Secondary | ICD-10-CM | POA: Diagnosis not present

## 2019-07-02 DIAGNOSIS — Z79891 Long term (current) use of opiate analgesic: Secondary | ICD-10-CM | POA: Diagnosis not present

## 2019-07-02 DIAGNOSIS — M545 Low back pain: Secondary | ICD-10-CM | POA: Diagnosis not present

## 2019-07-02 DIAGNOSIS — M25569 Pain in unspecified knee: Secondary | ICD-10-CM | POA: Diagnosis not present

## 2019-07-02 DIAGNOSIS — M130 Polyarthritis, unspecified: Secondary | ICD-10-CM | POA: Diagnosis not present

## 2019-07-03 DIAGNOSIS — J449 Chronic obstructive pulmonary disease, unspecified: Secondary | ICD-10-CM | POA: Diagnosis not present

## 2019-07-04 DIAGNOSIS — G8929 Other chronic pain: Secondary | ICD-10-CM | POA: Diagnosis not present

## 2019-07-04 DIAGNOSIS — Z7902 Long term (current) use of antithrombotics/antiplatelets: Secondary | ICD-10-CM | POA: Diagnosis not present

## 2019-07-04 DIAGNOSIS — M545 Low back pain: Secondary | ICD-10-CM | POA: Diagnosis not present

## 2019-07-04 DIAGNOSIS — I130 Hypertensive heart and chronic kidney disease with heart failure and stage 1 through stage 4 chronic kidney disease, or unspecified chronic kidney disease: Secondary | ICD-10-CM | POA: Diagnosis not present

## 2019-07-04 DIAGNOSIS — Z87440 Personal history of urinary (tract) infections: Secondary | ICD-10-CM | POA: Diagnosis not present

## 2019-07-04 DIAGNOSIS — J189 Pneumonia, unspecified organism: Secondary | ICD-10-CM | POA: Diagnosis not present

## 2019-07-04 DIAGNOSIS — Z9981 Dependence on supplemental oxygen: Secondary | ICD-10-CM | POA: Diagnosis not present

## 2019-07-04 DIAGNOSIS — N1832 Chronic kidney disease, stage 3b: Secondary | ICD-10-CM | POA: Diagnosis not present

## 2019-07-04 DIAGNOSIS — I5033 Acute on chronic diastolic (congestive) heart failure: Secondary | ICD-10-CM | POA: Diagnosis not present

## 2019-07-04 DIAGNOSIS — M6281 Muscle weakness (generalized): Secondary | ICD-10-CM | POA: Diagnosis not present

## 2019-07-04 DIAGNOSIS — J440 Chronic obstructive pulmonary disease with acute lower respiratory infection: Secondary | ICD-10-CM | POA: Diagnosis not present

## 2019-07-04 DIAGNOSIS — J9610 Chronic respiratory failure, unspecified whether with hypoxia or hypercapnia: Secondary | ICD-10-CM | POA: Diagnosis not present

## 2019-07-04 DIAGNOSIS — I2510 Atherosclerotic heart disease of native coronary artery without angina pectoris: Secondary | ICD-10-CM | POA: Diagnosis not present

## 2019-07-04 DIAGNOSIS — M1380 Other specified arthritis, unspecified site: Secondary | ICD-10-CM | POA: Diagnosis not present

## 2019-07-04 DIAGNOSIS — G4733 Obstructive sleep apnea (adult) (pediatric): Secondary | ICD-10-CM | POA: Diagnosis not present

## 2019-07-04 DIAGNOSIS — Z8673 Personal history of transient ischemic attack (TIA), and cerebral infarction without residual deficits: Secondary | ICD-10-CM | POA: Diagnosis not present

## 2019-07-07 DIAGNOSIS — I2510 Atherosclerotic heart disease of native coronary artery without angina pectoris: Secondary | ICD-10-CM | POA: Diagnosis not present

## 2019-07-07 DIAGNOSIS — J9610 Chronic respiratory failure, unspecified whether with hypoxia or hypercapnia: Secondary | ICD-10-CM | POA: Diagnosis not present

## 2019-07-07 DIAGNOSIS — J440 Chronic obstructive pulmonary disease with acute lower respiratory infection: Secondary | ICD-10-CM | POA: Diagnosis not present

## 2019-07-07 DIAGNOSIS — G4733 Obstructive sleep apnea (adult) (pediatric): Secondary | ICD-10-CM | POA: Diagnosis not present

## 2019-07-07 DIAGNOSIS — J189 Pneumonia, unspecified organism: Secondary | ICD-10-CM | POA: Diagnosis not present

## 2019-07-07 DIAGNOSIS — Z8673 Personal history of transient ischemic attack (TIA), and cerebral infarction without residual deficits: Secondary | ICD-10-CM | POA: Diagnosis not present

## 2019-07-07 DIAGNOSIS — M545 Low back pain: Secondary | ICD-10-CM | POA: Diagnosis not present

## 2019-07-07 DIAGNOSIS — G8929 Other chronic pain: Secondary | ICD-10-CM | POA: Diagnosis not present

## 2019-07-07 DIAGNOSIS — Z7902 Long term (current) use of antithrombotics/antiplatelets: Secondary | ICD-10-CM | POA: Diagnosis not present

## 2019-07-07 DIAGNOSIS — N1832 Chronic kidney disease, stage 3b: Secondary | ICD-10-CM | POA: Diagnosis not present

## 2019-07-07 DIAGNOSIS — M6281 Muscle weakness (generalized): Secondary | ICD-10-CM | POA: Diagnosis not present

## 2019-07-07 DIAGNOSIS — I130 Hypertensive heart and chronic kidney disease with heart failure and stage 1 through stage 4 chronic kidney disease, or unspecified chronic kidney disease: Secondary | ICD-10-CM | POA: Diagnosis not present

## 2019-07-07 DIAGNOSIS — Z87440 Personal history of urinary (tract) infections: Secondary | ICD-10-CM | POA: Diagnosis not present

## 2019-07-07 DIAGNOSIS — I5033 Acute on chronic diastolic (congestive) heart failure: Secondary | ICD-10-CM | POA: Diagnosis not present

## 2019-07-07 DIAGNOSIS — Z9981 Dependence on supplemental oxygen: Secondary | ICD-10-CM | POA: Diagnosis not present

## 2019-07-07 DIAGNOSIS — M1380 Other specified arthritis, unspecified site: Secondary | ICD-10-CM | POA: Diagnosis not present

## 2019-07-09 DIAGNOSIS — N1832 Chronic kidney disease, stage 3b: Secondary | ICD-10-CM | POA: Diagnosis not present

## 2019-07-09 DIAGNOSIS — Z87440 Personal history of urinary (tract) infections: Secondary | ICD-10-CM | POA: Diagnosis not present

## 2019-07-09 DIAGNOSIS — Z8673 Personal history of transient ischemic attack (TIA), and cerebral infarction without residual deficits: Secondary | ICD-10-CM | POA: Diagnosis not present

## 2019-07-09 DIAGNOSIS — G8929 Other chronic pain: Secondary | ICD-10-CM | POA: Diagnosis not present

## 2019-07-09 DIAGNOSIS — G4733 Obstructive sleep apnea (adult) (pediatric): Secondary | ICD-10-CM | POA: Diagnosis not present

## 2019-07-09 DIAGNOSIS — I2510 Atherosclerotic heart disease of native coronary artery without angina pectoris: Secondary | ICD-10-CM | POA: Diagnosis not present

## 2019-07-09 DIAGNOSIS — Z7902 Long term (current) use of antithrombotics/antiplatelets: Secondary | ICD-10-CM | POA: Diagnosis not present

## 2019-07-09 DIAGNOSIS — Z9981 Dependence on supplemental oxygen: Secondary | ICD-10-CM | POA: Diagnosis not present

## 2019-07-09 DIAGNOSIS — M6281 Muscle weakness (generalized): Secondary | ICD-10-CM | POA: Diagnosis not present

## 2019-07-09 DIAGNOSIS — I130 Hypertensive heart and chronic kidney disease with heart failure and stage 1 through stage 4 chronic kidney disease, or unspecified chronic kidney disease: Secondary | ICD-10-CM | POA: Diagnosis not present

## 2019-07-09 DIAGNOSIS — M545 Low back pain: Secondary | ICD-10-CM | POA: Diagnosis not present

## 2019-07-09 DIAGNOSIS — M1380 Other specified arthritis, unspecified site: Secondary | ICD-10-CM | POA: Diagnosis not present

## 2019-07-09 DIAGNOSIS — I5033 Acute on chronic diastolic (congestive) heart failure: Secondary | ICD-10-CM | POA: Diagnosis not present

## 2019-07-09 DIAGNOSIS — J440 Chronic obstructive pulmonary disease with acute lower respiratory infection: Secondary | ICD-10-CM | POA: Diagnosis not present

## 2019-07-09 DIAGNOSIS — J9610 Chronic respiratory failure, unspecified whether with hypoxia or hypercapnia: Secondary | ICD-10-CM | POA: Diagnosis not present

## 2019-07-09 DIAGNOSIS — J189 Pneumonia, unspecified organism: Secondary | ICD-10-CM | POA: Diagnosis not present

## 2019-07-14 DIAGNOSIS — Z23 Encounter for immunization: Secondary | ICD-10-CM | POA: Diagnosis not present

## 2019-07-16 ENCOUNTER — Encounter (HOSPITAL_COMMUNITY): Payer: Self-pay | Admitting: Psychiatry

## 2019-07-16 ENCOUNTER — Ambulatory Visit (INDEPENDENT_AMBULATORY_CARE_PROVIDER_SITE_OTHER): Payer: Medicare Other | Admitting: Psychiatry

## 2019-07-16 ENCOUNTER — Other Ambulatory Visit: Payer: Self-pay

## 2019-07-16 DIAGNOSIS — G8929 Other chronic pain: Secondary | ICD-10-CM | POA: Diagnosis not present

## 2019-07-16 DIAGNOSIS — J9610 Chronic respiratory failure, unspecified whether with hypoxia or hypercapnia: Secondary | ICD-10-CM | POA: Diagnosis not present

## 2019-07-16 DIAGNOSIS — I5033 Acute on chronic diastolic (congestive) heart failure: Secondary | ICD-10-CM | POA: Diagnosis not present

## 2019-07-16 DIAGNOSIS — Z87440 Personal history of urinary (tract) infections: Secondary | ICD-10-CM | POA: Diagnosis not present

## 2019-07-16 DIAGNOSIS — G4733 Obstructive sleep apnea (adult) (pediatric): Secondary | ICD-10-CM | POA: Diagnosis not present

## 2019-07-16 DIAGNOSIS — M1380 Other specified arthritis, unspecified site: Secondary | ICD-10-CM | POA: Diagnosis not present

## 2019-07-16 DIAGNOSIS — M545 Low back pain: Secondary | ICD-10-CM | POA: Diagnosis not present

## 2019-07-16 DIAGNOSIS — I2510 Atherosclerotic heart disease of native coronary artery without angina pectoris: Secondary | ICD-10-CM | POA: Diagnosis not present

## 2019-07-16 DIAGNOSIS — N1832 Chronic kidney disease, stage 3b: Secondary | ICD-10-CM | POA: Diagnosis not present

## 2019-07-16 DIAGNOSIS — J440 Chronic obstructive pulmonary disease with acute lower respiratory infection: Secondary | ICD-10-CM | POA: Diagnosis not present

## 2019-07-16 DIAGNOSIS — J189 Pneumonia, unspecified organism: Secondary | ICD-10-CM | POA: Diagnosis not present

## 2019-07-16 DIAGNOSIS — Z8673 Personal history of transient ischemic attack (TIA), and cerebral infarction without residual deficits: Secondary | ICD-10-CM | POA: Diagnosis not present

## 2019-07-16 DIAGNOSIS — F321 Major depressive disorder, single episode, moderate: Secondary | ICD-10-CM | POA: Diagnosis not present

## 2019-07-16 DIAGNOSIS — M6281 Muscle weakness (generalized): Secondary | ICD-10-CM | POA: Diagnosis not present

## 2019-07-16 DIAGNOSIS — Z7902 Long term (current) use of antithrombotics/antiplatelets: Secondary | ICD-10-CM | POA: Diagnosis not present

## 2019-07-16 DIAGNOSIS — Z9981 Dependence on supplemental oxygen: Secondary | ICD-10-CM | POA: Diagnosis not present

## 2019-07-16 DIAGNOSIS — I130 Hypertensive heart and chronic kidney disease with heart failure and stage 1 through stage 4 chronic kidney disease, or unspecified chronic kidney disease: Secondary | ICD-10-CM | POA: Diagnosis not present

## 2019-07-16 MED ORDER — ALPRAZOLAM 1 MG PO TABS: 1.0000 mg | ORAL_TABLET | Freq: Every day | ORAL | 2 refills | Status: DC

## 2019-07-16 MED ORDER — ARIPIPRAZOLE 5 MG PO TABS: 5.0000 mg | ORAL_TABLET | Freq: Every day | ORAL | 2 refills | Status: DC

## 2019-07-16 MED ORDER — TRAZODONE HCL 50 MG PO TABS: 75.0000 mg | ORAL_TABLET | Freq: Every day | ORAL | 2 refills | Status: DC

## 2019-07-16 MED ORDER — FLUOXETINE HCL 40 MG PO CAPS: 40.0000 mg | ORAL_CAPSULE | Freq: Every day | ORAL | 2 refills | Status: DC
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
## 2019-07-17 DIAGNOSIS — I2510 Atherosclerotic heart disease of native coronary artery without angina pectoris: Secondary | ICD-10-CM | POA: Diagnosis not present

## 2019-07-17 DIAGNOSIS — M159 Polyosteoarthritis, unspecified: Secondary | ICD-10-CM | POA: Diagnosis not present

## 2019-07-17 DIAGNOSIS — I509 Heart failure, unspecified: Secondary | ICD-10-CM | POA: Diagnosis not present

## 2019-07-18 DIAGNOSIS — J9610 Chronic respiratory failure, unspecified whether with hypoxia or hypercapnia: Secondary | ICD-10-CM | POA: Diagnosis not present

## 2019-07-18 DIAGNOSIS — J189 Pneumonia, unspecified organism: Secondary | ICD-10-CM | POA: Diagnosis not present

## 2019-07-18 DIAGNOSIS — M6281 Muscle weakness (generalized): Secondary | ICD-10-CM | POA: Diagnosis not present

## 2019-07-18 DIAGNOSIS — J440 Chronic obstructive pulmonary disease with acute lower respiratory infection: Secondary | ICD-10-CM | POA: Diagnosis not present

## 2019-07-23 DIAGNOSIS — E875 Hyperkalemia: Secondary | ICD-10-CM | POA: Diagnosis not present

## 2019-07-23 DIAGNOSIS — R809 Proteinuria, unspecified: Secondary | ICD-10-CM | POA: Diagnosis not present

## 2019-07-23 DIAGNOSIS — I5032 Chronic diastolic (congestive) heart failure: Secondary | ICD-10-CM | POA: Diagnosis not present

## 2019-07-23 DIAGNOSIS — Z79899 Other long term (current) drug therapy: Secondary | ICD-10-CM | POA: Diagnosis not present

## 2019-07-23 DIAGNOSIS — I129 Hypertensive chronic kidney disease with stage 1 through stage 4 chronic kidney disease, or unspecified chronic kidney disease: Secondary | ICD-10-CM | POA: Diagnosis not present

## 2019-07-23 DIAGNOSIS — E559 Vitamin D deficiency, unspecified: Secondary | ICD-10-CM | POA: Diagnosis not present

## 2019-07-24 DIAGNOSIS — J440 Chronic obstructive pulmonary disease with acute lower respiratory infection: Secondary | ICD-10-CM | POA: Diagnosis not present

## 2019-07-24 DIAGNOSIS — J189 Pneumonia, unspecified organism: Secondary | ICD-10-CM | POA: Diagnosis not present

## 2019-07-24 DIAGNOSIS — G8929 Other chronic pain: Secondary | ICD-10-CM | POA: Diagnosis not present

## 2019-07-24 DIAGNOSIS — M6281 Muscle weakness (generalized): Secondary | ICD-10-CM | POA: Diagnosis not present

## 2019-07-24 DIAGNOSIS — N1832 Chronic kidney disease, stage 3b: Secondary | ICD-10-CM | POA: Diagnosis not present

## 2019-07-24 DIAGNOSIS — I130 Hypertensive heart and chronic kidney disease with heart failure and stage 1 through stage 4 chronic kidney disease, or unspecified chronic kidney disease: Secondary | ICD-10-CM | POA: Diagnosis not present

## 2019-07-24 DIAGNOSIS — I2510 Atherosclerotic heart disease of native coronary artery without angina pectoris: Secondary | ICD-10-CM | POA: Diagnosis not present

## 2019-07-24 DIAGNOSIS — Z8673 Personal history of transient ischemic attack (TIA), and cerebral infarction without residual deficits: Secondary | ICD-10-CM | POA: Diagnosis not present

## 2019-07-24 DIAGNOSIS — J9610 Chronic respiratory failure, unspecified whether with hypoxia or hypercapnia: Secondary | ICD-10-CM | POA: Diagnosis not present

## 2019-07-24 DIAGNOSIS — Z9981 Dependence on supplemental oxygen: Secondary | ICD-10-CM | POA: Diagnosis not present

## 2019-07-24 DIAGNOSIS — Z7902 Long term (current) use of antithrombotics/antiplatelets: Secondary | ICD-10-CM | POA: Diagnosis not present

## 2019-07-24 DIAGNOSIS — G4733 Obstructive sleep apnea (adult) (pediatric): Secondary | ICD-10-CM | POA: Diagnosis not present

## 2019-07-24 DIAGNOSIS — M545 Low back pain: Secondary | ICD-10-CM | POA: Diagnosis not present

## 2019-07-24 DIAGNOSIS — Z87440 Personal history of urinary (tract) infections: Secondary | ICD-10-CM | POA: Diagnosis not present

## 2019-07-24 DIAGNOSIS — M1380 Other specified arthritis, unspecified site: Secondary | ICD-10-CM | POA: Diagnosis not present

## 2019-07-24 DIAGNOSIS — I5033 Acute on chronic diastolic (congestive) heart failure: Secondary | ICD-10-CM | POA: Diagnosis not present

## 2019-07-30 DIAGNOSIS — M6281 Muscle weakness (generalized): Secondary | ICD-10-CM | POA: Diagnosis not present

## 2019-07-30 DIAGNOSIS — Z87440 Personal history of urinary (tract) infections: Secondary | ICD-10-CM | POA: Diagnosis not present

## 2019-07-30 DIAGNOSIS — J449 Chronic obstructive pulmonary disease, unspecified: Secondary | ICD-10-CM | POA: Diagnosis not present

## 2019-07-30 DIAGNOSIS — J9610 Chronic respiratory failure, unspecified whether with hypoxia or hypercapnia: Secondary | ICD-10-CM | POA: Diagnosis not present

## 2019-07-30 DIAGNOSIS — Z9981 Dependence on supplemental oxygen: Secondary | ICD-10-CM | POA: Diagnosis not present

## 2019-07-30 DIAGNOSIS — I2510 Atherosclerotic heart disease of native coronary artery without angina pectoris: Secondary | ICD-10-CM | POA: Diagnosis not present

## 2019-07-30 DIAGNOSIS — N1832 Chronic kidney disease, stage 3b: Secondary | ICD-10-CM | POA: Diagnosis not present

## 2019-07-30 DIAGNOSIS — G4733 Obstructive sleep apnea (adult) (pediatric): Secondary | ICD-10-CM | POA: Diagnosis not present

## 2019-07-30 DIAGNOSIS — G8929 Other chronic pain: Secondary | ICD-10-CM | POA: Diagnosis not present

## 2019-07-30 DIAGNOSIS — I130 Hypertensive heart and chronic kidney disease with heart failure and stage 1 through stage 4 chronic kidney disease, or unspecified chronic kidney disease: Secondary | ICD-10-CM | POA: Diagnosis not present

## 2019-07-30 DIAGNOSIS — Z8673 Personal history of transient ischemic attack (TIA), and cerebral infarction without residual deficits: Secondary | ICD-10-CM | POA: Diagnosis not present

## 2019-07-30 DIAGNOSIS — M545 Low back pain: Secondary | ICD-10-CM | POA: Diagnosis not present

## 2019-07-30 DIAGNOSIS — M1380 Other specified arthritis, unspecified site: Secondary | ICD-10-CM | POA: Diagnosis not present

## 2019-07-30 DIAGNOSIS — Z7902 Long term (current) use of antithrombotics/antiplatelets: Secondary | ICD-10-CM | POA: Diagnosis not present

## 2019-07-30 DIAGNOSIS — J440 Chronic obstructive pulmonary disease with acute lower respiratory infection: Secondary | ICD-10-CM | POA: Diagnosis not present

## 2019-07-30 DIAGNOSIS — I5033 Acute on chronic diastolic (congestive) heart failure: Secondary | ICD-10-CM | POA: Diagnosis not present

## 2019-07-30 DIAGNOSIS — J189 Pneumonia, unspecified organism: Secondary | ICD-10-CM | POA: Diagnosis not present

## 2019-07-31 DIAGNOSIS — E559 Vitamin D deficiency, unspecified: Secondary | ICD-10-CM | POA: Diagnosis not present

## 2019-07-31 DIAGNOSIS — E875 Hyperkalemia: Secondary | ICD-10-CM | POA: Diagnosis not present

## 2019-07-31 DIAGNOSIS — R809 Proteinuria, unspecified: Secondary | ICD-10-CM | POA: Diagnosis not present

## 2019-07-31 DIAGNOSIS — I129 Hypertensive chronic kidney disease with stage 1 through stage 4 chronic kidney disease, or unspecified chronic kidney disease: Secondary | ICD-10-CM | POA: Diagnosis not present

## 2019-07-31 DIAGNOSIS — E211 Secondary hyperparathyroidism, not elsewhere classified: Secondary | ICD-10-CM | POA: Diagnosis not present

## 2019-08-05 ENCOUNTER — Telehealth: Payer: Medicare Other | Admitting: Cardiology

## 2019-08-05 DIAGNOSIS — G4733 Obstructive sleep apnea (adult) (pediatric): Secondary | ICD-10-CM | POA: Diagnosis not present

## 2019-08-05 DIAGNOSIS — M6281 Muscle weakness (generalized): Secondary | ICD-10-CM | POA: Diagnosis not present

## 2019-08-05 DIAGNOSIS — M545 Low back pain: Secondary | ICD-10-CM | POA: Diagnosis not present

## 2019-08-05 DIAGNOSIS — I130 Hypertensive heart and chronic kidney disease with heart failure and stage 1 through stage 4 chronic kidney disease, or unspecified chronic kidney disease: Secondary | ICD-10-CM | POA: Diagnosis not present

## 2019-08-05 DIAGNOSIS — Z7902 Long term (current) use of antithrombotics/antiplatelets: Secondary | ICD-10-CM | POA: Diagnosis not present

## 2019-08-05 DIAGNOSIS — Z9981 Dependence on supplemental oxygen: Secondary | ICD-10-CM | POA: Diagnosis not present

## 2019-08-05 DIAGNOSIS — Z8673 Personal history of transient ischemic attack (TIA), and cerebral infarction without residual deficits: Secondary | ICD-10-CM | POA: Diagnosis not present

## 2019-08-05 DIAGNOSIS — Z87440 Personal history of urinary (tract) infections: Secondary | ICD-10-CM | POA: Diagnosis not present

## 2019-08-05 DIAGNOSIS — M1380 Other specified arthritis, unspecified site: Secondary | ICD-10-CM | POA: Diagnosis not present

## 2019-08-05 DIAGNOSIS — I5033 Acute on chronic diastolic (congestive) heart failure: Secondary | ICD-10-CM | POA: Diagnosis not present

## 2019-08-05 DIAGNOSIS — J9610 Chronic respiratory failure, unspecified whether with hypoxia or hypercapnia: Secondary | ICD-10-CM | POA: Diagnosis not present

## 2019-08-05 DIAGNOSIS — I2510 Atherosclerotic heart disease of native coronary artery without angina pectoris: Secondary | ICD-10-CM | POA: Diagnosis not present

## 2019-08-05 DIAGNOSIS — N1832 Chronic kidney disease, stage 3b: Secondary | ICD-10-CM | POA: Diagnosis not present

## 2019-08-05 DIAGNOSIS — J449 Chronic obstructive pulmonary disease, unspecified: Secondary | ICD-10-CM | POA: Diagnosis not present

## 2019-08-05 DIAGNOSIS — G8929 Other chronic pain: Secondary | ICD-10-CM | POA: Diagnosis not present

## 2019-08-05 NOTE — Progress Notes (Deleted)
{Choose 1 Note Type (Video or Telephone):[id number]}   The patient was identified using 2 identifiers.  Date:  08/05/2019   ID:  [name], DOB [birth year], MRN [id number]  {Patient Location:[phone]::"Home"} {Provider Location:[phone]::"Home"}  PCP:  Kirstie Peri, MD  Cardiologist:  Dina Rich, MD *** Electrophysiologist:  None   Evaluation Performed:  {Choose Visit Type:[phone]::"Follow-Up Visit"}  Chief Complaint:  ***  History of Present Illness:    Mariah White is a 69 y.o. female seen today for follow up of the following medical problems.  1. SOB/ChronicdiasotlicHF - started 1 year ago. Can occur at rest or with activity. Can have some coughing or wheezing. Can have some chest tightness at times. Midchest, 4/10 in severity. Tightness can occur at rest or with activity. Can be positional. Lasts a few minutes - has had some recent LE edema. Started about 4 months. Started on lasix a few months ago by  - 08/2016 echo: LVEF >75%, abnormal diastolic function per report. Normal LA, normal RV. Regarding diastolic function E/A 0.9, lateral e' 9cm, E/e' 8, decel time not reported, no normal LA.    - admit 11/2017 with AKI and hyperkalemia, ACE-I thought to be related.Lisinopril stopped.Continued on lasix 60mg  bid. - no recent edema. Remains on lasix 60mg  bid.  - admit 07/2018 to [facility] with pneumonia, AKI. Lasix at that time lowered to 40mg  daily. Since then has been titrated up at outpatient appoinments.    - ongoing swelling in legs, stable. Weight got up to 190 lbs about 2 months - swelling getting better on lasix 40mg  bid. Dosing limited by renal dysfunction, she is followed by neprhology     2. COPD - ongoign wheezing, chronic - followed by pcp    3. History of CVA - she has been on ASA and plavixby another provider   4. HTN - compliant with meds  5. Weakness - recent admission at [facility] in 12/2018 -  presented with slurred speech, balance issues, confusion Has outpatient f/u.   6. CKD - followed by Dr   The patient {does/does not:200015} have symptoms concerning for COVID-19 infection (fever, chills, cough, or new shortness of breath).    Past Medical History:  Diagnosis Date  . Anxiety   . Arthritis   . CHF (congestive heart failure) (HCC)   . Chronic back pain   . Chronic pain   . COPD (chronic obstructive pulmonary disease) (HCC)   . Coronary artery disease   . Depression   . GERD (gastroesophageal reflux disease)   . Hypertension   . Migraine   . Stroke [facility])    Past Surgical History:  Procedure Laterality Date  . ABDOMINAL HYSTERECTOMY    . breast lump removed    . BREAST SURGERY    . CHOLECYSTECTOMY    . FOOT SURGERY       No outpatient medications have been marked as taking for the 08/05/19 encounter (Appointment) with 01/2019, MD.     Allergies:   Penicillins   Social History   Tobacco Use  . Smoking status: Current Every Day Smoker    Packs/day: 1.00    Types: Cigarettes    Start date: 01/18/1959  . Smokeless tobacco: Never Used  . Tobacco comment: 1 pack per day   Substance Use Topics  . Alcohol use: No  . Drug use: No     Family Hx: The patient's family history includes Bipolar disorder in her daughter; Colon cancer in her sister and  sister; Diabetes in her brother, brother, mother, and sister; Heart attack in her brother; Pancreatic cancer in her brother; Stroke in her father.  ROS:   Please see the history of present illness.    *** All other systems reviewed and are negative.   Prior CV studies:   The following studies were reviewed today:  ***  Labs/Other Tests and Data Reviewed:    EKG:  {EKG/Telemetry Strips Reviewed:[id number]}  Recent Labs: No results found for requested labs within last 8760 hours.   Recent Lipid Panel Lab Results  Component Value Date/Time   TRIG 389 (H) 05/11/2017 05:45 AM     Wt Readings from Last 3 Encounters:  01/31/19 177 lb 6.4 oz (80.5 kg)  10/24/18 186 lb (84.4 kg)  03/06/18 183 lb (83 kg)     Objective:    Vital Signs:  There were no vitals taken for this visit.   {HeartCare Virtual Exam (Optional):[phone]::"VITAL SIGNS:  reviewed"}  ASSESSMENT & PLAN:    1.Chronic diastolic HF  weights improving, continues to have LE edema. Diuretic dosing limited due to renal dysfunction, would defer dosing to nephrology.    2. COPD exacerbation - will give 5 days of prednisone 40mg  daily  3. HTN - manual recheck 108/60, at goal - some fatigue and mild bradycardia, no strong secondary indication for toprol. Will d/c   COVID-19 Education: The signs and symptoms of COVID-19 were discussed with the patient and how to seek care for testing (follow up with PCP or arrange E-visit).  ***The importance of social distancing was discussed today.  Time:   Today, I have spent *** minutes with the patient with telehealth technology discussing the above problems.     Medication Adjustments/Labs and Tests Ordered: Current medicines are reviewed at length with the patient today.  Concerns regarding medicines are outlined above.   Tests Ordered: No orders of the defined types were placed in this encounter.   Medication Changes: No orders of the defined types were placed in this encounter.   Follow Up:  {F/U Format:[id number]} {follow JX:91478}  Merrily Pew, MD  08/05/2019 9:32 AM    [facility]

## 2019-08-08 ENCOUNTER — Encounter: Payer: Self-pay | Admitting: Cardiology

## 2019-08-08 ENCOUNTER — Telehealth (INDEPENDENT_AMBULATORY_CARE_PROVIDER_SITE_OTHER): Payer: Medicare Other | Admitting: Cardiology

## 2019-08-08 VITALS — BP 108/68 | HR 73 | Ht 61.0 in | Wt 170.0 lb

## 2019-08-08 DIAGNOSIS — I5032 Chronic diastolic (congestive) heart failure: Secondary | ICD-10-CM

## 2019-08-08 DIAGNOSIS — N1832 Chronic kidney disease, stage 3b: Secondary | ICD-10-CM | POA: Diagnosis not present

## 2019-08-08 DIAGNOSIS — I10 Essential (primary) hypertension: Secondary | ICD-10-CM

## 2019-08-08 NOTE — Progress Notes (Signed)
Virtual Visit via Telephone Note   This visit type was conducted due to national recommendations for restrictions regarding the COVID-19 Pandemic (e.g. social distancing) in an effort to limit this patient's exposure and mitigate transmission in our community.  Due to her co-morbid illnesses, this patient is at least at moderate risk for complications without adequate follow up.  This format is felt to be most appropriate for this patient at this time.  The patient did not have access to video technology/had technical difficulties with video requiring transitioning to audio format only (telephone).  All issues noted in this document were discussed and addressed.  No physical exam could be performed with this format.  Please refer to the patient's chart for her  consent to telehealth for [facility].   The patient was identified using 2 identifiers.  Date:  08/08/2019   ID:  Mariah White, DOB [birth year], MRN [id number]  Patient Location: Home Provider Location: Office  PCP:  Kirstie Peri, MD  Cardiologist:  Dina Rich, MD  Electrophysiologist:  None   Evaluation Performed:  Follow-Up Visit  Chief Complaint:  Follow up visit  History of Present Illness:    Mariah White is a 69 y.o. female seen today for follow up of the following medical problems.  1. SOB/ChronicdiasotlicHF - started 1 year ago. Can occur at rest or with activity. Can have some coughing or wheezing. Can have some chest tightness at times. Midchest, 4/10 in severity. Tightness can occur at rest or with activity. Can be positional. Lasts a few minutes - has had some recent LE edema. Started about 4 months. Started on lasix a few months ago by  - 08/2016 echo: LVEF >75%, abnormal diastolic function per report. Normal LA, normal RV. Regarding diastolic function E/A 0.9, lateral e' 9cm, E/e' 8, decel time not reported, no normal LA.    - some ongoing edema in legs, overall up and down - she is lasix  40mg  bid. Has had some weight loss down to 170 lbs.  - no SOB/DOE   2. COPD - followed by pcp    3. History of CVA - she has been on ASA and plavixby another provider. Not for cardiac reason  4. HTN -she is compliant with meds   5. CKD III - followed by Dr - has proteinuria but not on RAS blockers due to propensity of AKI and hyperkalemia      Has had covid vaccine x 2.     The patient does not have symptoms concerning for COVID-19 infection (fever, chills, cough, or new shortness of breath).    Past Medical History:  Diagnosis Date  . Anxiety   . Arthritis   . CHF (congestive heart failure) (HCC)   . Chronic back pain   . Chronic pain   . COPD (chronic obstructive pulmonary disease) (HCC)   . Coronary artery disease   . Depression   . GERD (gastroesophageal reflux disease)   . Hypertension   . Migraine   . Stroke [facility])    Past Surgical History:  Procedure Laterality Date  . ABDOMINAL HYSTERECTOMY    . breast lump removed    . BREAST SURGERY    . CHOLECYSTECTOMY    . FOOT SURGERY       Current Meds  Medication Sig  . albuterol (PROVENTIL HFA;VENTOLIN HFA) 108 (90 BASE) MCG/ACT inhaler Inhale 2 puffs into the lungs 2 (two) times daily.  [facility] ALPRAZolam (XANAX) 1 MG tablet Take 1 tablet (  1 mg total) by mouth at bedtime.  Marland Kitchen amLODipine (NORVASC) 5 MG tablet Take 1 tablet by mouth daily.  Marland Kitchen aspirin EC 81 MG tablet Take 81 mg by mouth daily.  . clopidogrel (PLAVIX) 75 MG tablet Take 75 mg by mouth daily.  . diclofenac sodium (VOLTAREN) 1 % GEL Apply 1 application topically every 4 (four) hours as needed.  Marland Kitchen FLUoxetine (PROZAC) 40 MG capsule Take 1 capsule (40 mg total) by mouth daily.  . furosemide (LASIX) 40 MG tablet Take 1 tablet (40 mg total) by mouth 2 (two) times daily.  Marland Kitchen gabapentin (NEURONTIN) 600 MG tablet Take 600 mg 3 (three) times daily by mouth.  Marland Kitchen ipratropium-albuterol (DUONEB) 0.5-2.5 (3) MG/3ML SOLN Inhale 3 mLs into the lungs  every 6 (six) hours as needed.  . meloxicam (MOBIC) 7.5 MG tablet Take 7.5 mg by mouth daily.  . methocarbamol (ROBAXIN) 500 MG tablet Take 500 mg by mouth 2 (two) times daily as needed.  Marland Kitchen oxyCODONE (OXY IR/ROXICODONE) 5 MG immediate release tablet Take 5 mg by mouth every 6 (six) hours as needed.  . pravastatin (PRAVACHOL) 80 MG tablet Take 80 mg by mouth daily.  Marland Kitchen spironolactone (ALDACTONE) 25 MG tablet Take 25 mg by mouth daily.  . traZODone (DESYREL) 50 MG tablet Take 1.5 tablets (75 mg total) by mouth at bedtime.     Allergies:   Penicillins   Social History   Tobacco Use  . Smoking status: Current Every Day Smoker    Packs/day: 1.00    Types: Cigarettes    Start date: 01/18/1959  . Smokeless tobacco: Never Used  . Tobacco comment: 1 pack per day   Substance Use Topics  . Alcohol use: No  . Drug use: No     Family Hx: The patient's family history includes Bipolar disorder in her daughter; Colon cancer in her sister and sister; Diabetes in her brother, brother, mother, and sister; Heart attack in her brother; Pancreatic cancer in her brother; Stroke in her father.  ROS:   Please see the history of present illness.     All other systems reviewed and are negative.   Prior CV studies:   The following studies were reviewed today:    Labs/Other Tests and Data Reviewed:    EKG:  No ECG reviewed.  Recent Labs: No results found for requested labs within last 8760 hours.   Recent Lipid Panel Lab Results  Component Value Date/Time   TRIG 389 (H) 05/11/2017 05:45 AM    Wt Readings from Last 3 Encounters:  08/08/19 170 lb (77.1 kg)  01/31/19 177 lb 6.4 oz (80.5 kg)  10/24/18 186 lb (84.4 kg)     Objective:    Vital Signs:  BP 108/68   Pulse 73   Ht 5\' 1"  (1.549 m)   Wt 170 lb (77.1 kg)   BMI 32.12 kg/m    Normal affect. Normal speech pattern and tone. COmfortable, no apparent distress. No audible signs of sob or wheezing.   ASSESSMENT & PLAN:     1.Chronic diastolic HF - overall doing well, weights actually are down. Careful diuretic dosing due to renal dysfnction, at recent labs with nephrology renal fucntion was actually mildly improved - continue current diuretic, in general defer dosing to nephrology   2. HTN - at goal, continue current meds   3. Hyperlipidemia - labs followed by pcp, continue statin  4. CKD 3B - limit nephrotoxic meds - recent labs slightly improvement in GFR  Family will ask pcp to add a Mg and lipid panel to labs, patient had bmet and cbc later 07/2019 with neprhologist.   COVID-19 Education: The signs and symptoms of COVID-19 were discussed with the patient and how to seek care for testing (follow up with PCP or arrange E-visit).  The importance of social distancing was discussed today.  Time:   Today, I have spent 21 minutes with the patient with telehealth technology discussing the above problems.     Medication Adjustments/Labs and Tests Ordered: Current medicines are reviewed at length with the patient today.  Concerns regarding medicines are outlined above.   Tests Ordered: No orders of the defined types were placed in this encounter.   Medication Changes: No orders of the defined types were placed in this encounter.   Follow Up:  Either In Person or Virtual in 6 month(s)  Signed, Carlyle Dolly, MD  08/08/2019 11:20 AM    [place]

## 2019-08-08 NOTE — Patient Instructions (Addendum)
Medication Instructions:   Your physician recommends that you continue on your current medications as directed. Please refer to the Current Medication list given to you today.  Labwork:  NONE  Testing/Procedures:  NONE  Follow-Up:  Your physician recommends that you schedule a follow-up appointment in: 6 months (office or virtual). You will receive a reminder letter in the mail in about 4 months reminding you to call and schedule your appointment. If you don't receive this letter, please contact our office.  Any Other Special Instructions Will Be Listed Below (If Applicable).  If you need a refill on your cardiac medications before your next appointment, please call your pharmacy. 

## 2019-08-12 DIAGNOSIS — N1832 Chronic kidney disease, stage 3b: Secondary | ICD-10-CM | POA: Diagnosis not present

## 2019-08-12 DIAGNOSIS — I5033 Acute on chronic diastolic (congestive) heart failure: Secondary | ICD-10-CM | POA: Diagnosis not present

## 2019-08-12 DIAGNOSIS — J449 Chronic obstructive pulmonary disease, unspecified: Secondary | ICD-10-CM | POA: Diagnosis not present

## 2019-08-12 DIAGNOSIS — M6281 Muscle weakness (generalized): Secondary | ICD-10-CM | POA: Diagnosis not present

## 2019-08-12 DIAGNOSIS — M545 Low back pain: Secondary | ICD-10-CM | POA: Diagnosis not present

## 2019-08-12 DIAGNOSIS — I2510 Atherosclerotic heart disease of native coronary artery without angina pectoris: Secondary | ICD-10-CM | POA: Diagnosis not present

## 2019-08-12 DIAGNOSIS — G8929 Other chronic pain: Secondary | ICD-10-CM | POA: Diagnosis not present

## 2019-08-12 DIAGNOSIS — Z7902 Long term (current) use of antithrombotics/antiplatelets: Secondary | ICD-10-CM | POA: Diagnosis not present

## 2019-08-12 DIAGNOSIS — J9610 Chronic respiratory failure, unspecified whether with hypoxia or hypercapnia: Secondary | ICD-10-CM | POA: Diagnosis not present

## 2019-08-12 DIAGNOSIS — Z87440 Personal history of urinary (tract) infections: Secondary | ICD-10-CM | POA: Diagnosis not present

## 2019-08-12 DIAGNOSIS — Z8673 Personal history of transient ischemic attack (TIA), and cerebral infarction without residual deficits: Secondary | ICD-10-CM | POA: Diagnosis not present

## 2019-08-12 DIAGNOSIS — I130 Hypertensive heart and chronic kidney disease with heart failure and stage 1 through stage 4 chronic kidney disease, or unspecified chronic kidney disease: Secondary | ICD-10-CM | POA: Diagnosis not present

## 2019-08-12 DIAGNOSIS — M1380 Other specified arthritis, unspecified site: Secondary | ICD-10-CM | POA: Diagnosis not present

## 2019-08-12 DIAGNOSIS — G4733 Obstructive sleep apnea (adult) (pediatric): Secondary | ICD-10-CM | POA: Diagnosis not present

## 2019-08-12 DIAGNOSIS — Z9981 Dependence on supplemental oxygen: Secondary | ICD-10-CM | POA: Diagnosis not present

## 2019-08-19 DIAGNOSIS — Z7902 Long term (current) use of antithrombotics/antiplatelets: Secondary | ICD-10-CM | POA: Diagnosis not present

## 2019-08-19 DIAGNOSIS — J9610 Chronic respiratory failure, unspecified whether with hypoxia or hypercapnia: Secondary | ICD-10-CM | POA: Diagnosis not present

## 2019-08-19 DIAGNOSIS — G8929 Other chronic pain: Secondary | ICD-10-CM | POA: Diagnosis not present

## 2019-08-19 DIAGNOSIS — M1380 Other specified arthritis, unspecified site: Secondary | ICD-10-CM | POA: Diagnosis not present

## 2019-08-19 DIAGNOSIS — I2510 Atherosclerotic heart disease of native coronary artery without angina pectoris: Secondary | ICD-10-CM | POA: Diagnosis not present

## 2019-08-19 DIAGNOSIS — N1832 Chronic kidney disease, stage 3b: Secondary | ICD-10-CM | POA: Diagnosis not present

## 2019-08-19 DIAGNOSIS — I5033 Acute on chronic diastolic (congestive) heart failure: Secondary | ICD-10-CM | POA: Diagnosis not present

## 2019-08-19 DIAGNOSIS — I130 Hypertensive heart and chronic kidney disease with heart failure and stage 1 through stage 4 chronic kidney disease, or unspecified chronic kidney disease: Secondary | ICD-10-CM | POA: Diagnosis not present

## 2019-08-19 DIAGNOSIS — Z9981 Dependence on supplemental oxygen: Secondary | ICD-10-CM | POA: Diagnosis not present

## 2019-08-19 DIAGNOSIS — M545 Low back pain: Secondary | ICD-10-CM | POA: Diagnosis not present

## 2019-08-19 DIAGNOSIS — G4733 Obstructive sleep apnea (adult) (pediatric): Secondary | ICD-10-CM | POA: Diagnosis not present

## 2019-08-19 DIAGNOSIS — M6281 Muscle weakness (generalized): Secondary | ICD-10-CM | POA: Diagnosis not present

## 2019-08-19 DIAGNOSIS — Z8673 Personal history of transient ischemic attack (TIA), and cerebral infarction without residual deficits: Secondary | ICD-10-CM | POA: Diagnosis not present

## 2019-08-19 DIAGNOSIS — J449 Chronic obstructive pulmonary disease, unspecified: Secondary | ICD-10-CM | POA: Diagnosis not present

## 2019-08-19 DIAGNOSIS — Z87440 Personal history of urinary (tract) infections: Secondary | ICD-10-CM | POA: Diagnosis not present

## 2019-08-21 DIAGNOSIS — J449 Chronic obstructive pulmonary disease, unspecified: Secondary | ICD-10-CM | POA: Diagnosis not present

## 2019-08-25 DIAGNOSIS — J449 Chronic obstructive pulmonary disease, unspecified: Secondary | ICD-10-CM | POA: Diagnosis not present

## 2019-08-25 DIAGNOSIS — Z7902 Long term (current) use of antithrombotics/antiplatelets: Secondary | ICD-10-CM | POA: Diagnosis not present

## 2019-08-25 DIAGNOSIS — M545 Low back pain: Secondary | ICD-10-CM | POA: Diagnosis not present

## 2019-08-25 DIAGNOSIS — I2510 Atherosclerotic heart disease of native coronary artery without angina pectoris: Secondary | ICD-10-CM | POA: Diagnosis not present

## 2019-08-25 DIAGNOSIS — J9610 Chronic respiratory failure, unspecified whether with hypoxia or hypercapnia: Secondary | ICD-10-CM | POA: Diagnosis not present

## 2019-08-25 DIAGNOSIS — G4733 Obstructive sleep apnea (adult) (pediatric): Secondary | ICD-10-CM | POA: Diagnosis not present

## 2019-08-25 DIAGNOSIS — I130 Hypertensive heart and chronic kidney disease with heart failure and stage 1 through stage 4 chronic kidney disease, or unspecified chronic kidney disease: Secondary | ICD-10-CM | POA: Diagnosis not present

## 2019-08-25 DIAGNOSIS — Z87440 Personal history of urinary (tract) infections: Secondary | ICD-10-CM | POA: Diagnosis not present

## 2019-08-25 DIAGNOSIS — M6281 Muscle weakness (generalized): Secondary | ICD-10-CM | POA: Diagnosis not present

## 2019-08-25 DIAGNOSIS — M1380 Other specified arthritis, unspecified site: Secondary | ICD-10-CM | POA: Diagnosis not present

## 2019-08-25 DIAGNOSIS — Z9981 Dependence on supplemental oxygen: Secondary | ICD-10-CM | POA: Diagnosis not present

## 2019-08-25 DIAGNOSIS — G8929 Other chronic pain: Secondary | ICD-10-CM | POA: Diagnosis not present

## 2019-08-25 DIAGNOSIS — I5033 Acute on chronic diastolic (congestive) heart failure: Secondary | ICD-10-CM | POA: Diagnosis not present

## 2019-08-25 DIAGNOSIS — Z8673 Personal history of transient ischemic attack (TIA), and cerebral infarction without residual deficits: Secondary | ICD-10-CM | POA: Diagnosis not present

## 2019-08-25 DIAGNOSIS — N1832 Chronic kidney disease, stage 3b: Secondary | ICD-10-CM | POA: Diagnosis not present

## 2019-08-27 DIAGNOSIS — Z7902 Long term (current) use of antithrombotics/antiplatelets: Secondary | ICD-10-CM | POA: Diagnosis not present

## 2019-08-27 DIAGNOSIS — I130 Hypertensive heart and chronic kidney disease with heart failure and stage 1 through stage 4 chronic kidney disease, or unspecified chronic kidney disease: Secondary | ICD-10-CM | POA: Diagnosis not present

## 2019-08-27 DIAGNOSIS — G8929 Other chronic pain: Secondary | ICD-10-CM | POA: Diagnosis not present

## 2019-08-27 DIAGNOSIS — Z87440 Personal history of urinary (tract) infections: Secondary | ICD-10-CM | POA: Diagnosis not present

## 2019-08-27 DIAGNOSIS — I2510 Atherosclerotic heart disease of native coronary artery without angina pectoris: Secondary | ICD-10-CM | POA: Diagnosis not present

## 2019-08-27 DIAGNOSIS — J9610 Chronic respiratory failure, unspecified whether with hypoxia or hypercapnia: Secondary | ICD-10-CM | POA: Diagnosis not present

## 2019-08-27 DIAGNOSIS — M25569 Pain in unspecified knee: Secondary | ICD-10-CM | POA: Diagnosis not present

## 2019-08-27 DIAGNOSIS — M130 Polyarthritis, unspecified: Secondary | ICD-10-CM | POA: Diagnosis not present

## 2019-08-27 DIAGNOSIS — Z79891 Long term (current) use of opiate analgesic: Secondary | ICD-10-CM | POA: Diagnosis not present

## 2019-08-27 DIAGNOSIS — N1832 Chronic kidney disease, stage 3b: Secondary | ICD-10-CM | POA: Diagnosis not present

## 2019-08-27 DIAGNOSIS — J449 Chronic obstructive pulmonary disease, unspecified: Secondary | ICD-10-CM | POA: Diagnosis not present

## 2019-08-27 DIAGNOSIS — Z9981 Dependence on supplemental oxygen: Secondary | ICD-10-CM | POA: Diagnosis not present

## 2019-08-27 DIAGNOSIS — G4733 Obstructive sleep apnea (adult) (pediatric): Secondary | ICD-10-CM | POA: Diagnosis not present

## 2019-08-27 DIAGNOSIS — G4700 Insomnia, unspecified: Secondary | ICD-10-CM | POA: Diagnosis not present

## 2019-08-27 DIAGNOSIS — M6281 Muscle weakness (generalized): Secondary | ICD-10-CM | POA: Diagnosis not present

## 2019-08-27 DIAGNOSIS — I5033 Acute on chronic diastolic (congestive) heart failure: Secondary | ICD-10-CM | POA: Diagnosis not present

## 2019-08-27 DIAGNOSIS — Z8673 Personal history of transient ischemic attack (TIA), and cerebral infarction without residual deficits: Secondary | ICD-10-CM | POA: Diagnosis not present

## 2019-08-27 DIAGNOSIS — M545 Low back pain: Secondary | ICD-10-CM | POA: Diagnosis not present

## 2019-08-27 DIAGNOSIS — M1380 Other specified arthritis, unspecified site: Secondary | ICD-10-CM | POA: Diagnosis not present

## 2019-08-28 DIAGNOSIS — Z743 Need for continuous supervision: Secondary | ICD-10-CM | POA: Diagnosis not present

## 2019-08-28 DIAGNOSIS — M1711 Unilateral primary osteoarthritis, right knee: Secondary | ICD-10-CM | POA: Diagnosis not present

## 2019-08-28 DIAGNOSIS — R0689 Other abnormalities of breathing: Secondary | ICD-10-CM | POA: Diagnosis not present

## 2019-08-28 DIAGNOSIS — R300 Dysuria: Secondary | ICD-10-CM | POA: Diagnosis not present

## 2019-08-28 DIAGNOSIS — M1712 Unilateral primary osteoarthritis, left knee: Secondary | ICD-10-CM | POA: Diagnosis not present

## 2019-08-28 DIAGNOSIS — M170 Bilateral primary osteoarthritis of knee: Secondary | ICD-10-CM | POA: Diagnosis not present

## 2019-08-31 DIAGNOSIS — I2510 Atherosclerotic heart disease of native coronary artery without angina pectoris: Secondary | ICD-10-CM | POA: Diagnosis not present

## 2019-08-31 DIAGNOSIS — I509 Heart failure, unspecified: Secondary | ICD-10-CM | POA: Diagnosis not present

## 2019-08-31 DIAGNOSIS — M159 Polyosteoarthritis, unspecified: Secondary | ICD-10-CM | POA: Diagnosis not present

## 2019-09-02 DIAGNOSIS — J449 Chronic obstructive pulmonary disease, unspecified: Secondary | ICD-10-CM | POA: Diagnosis not present

## 2019-09-02 DIAGNOSIS — N1832 Chronic kidney disease, stage 3b: Secondary | ICD-10-CM | POA: Diagnosis not present

## 2019-09-02 DIAGNOSIS — J9610 Chronic respiratory failure, unspecified whether with hypoxia or hypercapnia: Secondary | ICD-10-CM | POA: Diagnosis not present

## 2019-09-02 DIAGNOSIS — G8929 Other chronic pain: Secondary | ICD-10-CM | POA: Diagnosis not present

## 2019-09-02 DIAGNOSIS — Z7902 Long term (current) use of antithrombotics/antiplatelets: Secondary | ICD-10-CM | POA: Diagnosis not present

## 2019-09-02 DIAGNOSIS — G4733 Obstructive sleep apnea (adult) (pediatric): Secondary | ICD-10-CM | POA: Diagnosis not present

## 2019-09-02 DIAGNOSIS — I2510 Atherosclerotic heart disease of native coronary artery without angina pectoris: Secondary | ICD-10-CM | POA: Diagnosis not present

## 2019-09-02 DIAGNOSIS — M545 Low back pain: Secondary | ICD-10-CM | POA: Diagnosis not present

## 2019-09-02 DIAGNOSIS — M1380 Other specified arthritis, unspecified site: Secondary | ICD-10-CM | POA: Diagnosis not present

## 2019-09-02 DIAGNOSIS — M6281 Muscle weakness (generalized): Secondary | ICD-10-CM | POA: Diagnosis not present

## 2019-09-02 DIAGNOSIS — I130 Hypertensive heart and chronic kidney disease with heart failure and stage 1 through stage 4 chronic kidney disease, or unspecified chronic kidney disease: Secondary | ICD-10-CM | POA: Diagnosis not present

## 2019-09-02 DIAGNOSIS — Z87440 Personal history of urinary (tract) infections: Secondary | ICD-10-CM | POA: Diagnosis not present

## 2019-09-02 DIAGNOSIS — Z8673 Personal history of transient ischemic attack (TIA), and cerebral infarction without residual deficits: Secondary | ICD-10-CM | POA: Diagnosis not present

## 2019-09-02 DIAGNOSIS — Z9981 Dependence on supplemental oxygen: Secondary | ICD-10-CM | POA: Diagnosis not present

## 2019-09-02 DIAGNOSIS — I5033 Acute on chronic diastolic (congestive) heart failure: Secondary | ICD-10-CM | POA: Diagnosis not present

## 2019-09-04 ENCOUNTER — Encounter (HOSPITAL_COMMUNITY): Payer: Self-pay | Admitting: Psychiatry

## 2019-09-04 ENCOUNTER — Telehealth (INDEPENDENT_AMBULATORY_CARE_PROVIDER_SITE_OTHER): Payer: Medicare Other | Admitting: Psychiatry

## 2019-09-04 ENCOUNTER — Other Ambulatory Visit: Payer: Self-pay

## 2019-09-04 DIAGNOSIS — Z87440 Personal history of urinary (tract) infections: Secondary | ICD-10-CM | POA: Diagnosis not present

## 2019-09-04 DIAGNOSIS — I130 Hypertensive heart and chronic kidney disease with heart failure and stage 1 through stage 4 chronic kidney disease, or unspecified chronic kidney disease: Secondary | ICD-10-CM | POA: Diagnosis not present

## 2019-09-04 DIAGNOSIS — Z9981 Dependence on supplemental oxygen: Secondary | ICD-10-CM | POA: Diagnosis not present

## 2019-09-04 DIAGNOSIS — M1380 Other specified arthritis, unspecified site: Secondary | ICD-10-CM | POA: Diagnosis not present

## 2019-09-04 DIAGNOSIS — Z8673 Personal history of transient ischemic attack (TIA), and cerebral infarction without residual deficits: Secondary | ICD-10-CM | POA: Diagnosis not present

## 2019-09-04 DIAGNOSIS — N1832 Chronic kidney disease, stage 3b: Secondary | ICD-10-CM | POA: Diagnosis not present

## 2019-09-04 DIAGNOSIS — J9610 Chronic respiratory failure, unspecified whether with hypoxia or hypercapnia: Secondary | ICD-10-CM | POA: Diagnosis not present

## 2019-09-04 DIAGNOSIS — G8929 Other chronic pain: Secondary | ICD-10-CM | POA: Diagnosis not present

## 2019-09-04 DIAGNOSIS — J449 Chronic obstructive pulmonary disease, unspecified: Secondary | ICD-10-CM | POA: Diagnosis not present

## 2019-09-04 DIAGNOSIS — G4733 Obstructive sleep apnea (adult) (pediatric): Secondary | ICD-10-CM | POA: Diagnosis not present

## 2019-09-04 DIAGNOSIS — F321 Major depressive disorder, single episode, moderate: Secondary | ICD-10-CM

## 2019-09-04 DIAGNOSIS — M545 Low back pain: Secondary | ICD-10-CM | POA: Diagnosis not present

## 2019-09-04 DIAGNOSIS — Z7902 Long term (current) use of antithrombotics/antiplatelets: Secondary | ICD-10-CM | POA: Diagnosis not present

## 2019-09-04 DIAGNOSIS — I2510 Atherosclerotic heart disease of native coronary artery without angina pectoris: Secondary | ICD-10-CM | POA: Diagnosis not present

## 2019-09-04 DIAGNOSIS — I5033 Acute on chronic diastolic (congestive) heart failure: Secondary | ICD-10-CM | POA: Diagnosis not present

## 2019-09-04 DIAGNOSIS — M6281 Muscle weakness (generalized): Secondary | ICD-10-CM | POA: Diagnosis not present

## 2019-09-04 MED ORDER — ARIPIPRAZOLE 5 MG PO TABS: 5.0000 mg | ORAL_TABLET | Freq: Every day | ORAL | 2 refills | Status: DC

## 2019-09-04 MED ORDER — FLUOXETINE HCL 40 MG PO CAPS: 40.0000 mg | ORAL_CAPSULE | Freq: Every day | ORAL | 2 refills | Status: DC

## 2019-09-04 MED ORDER — TRAZODONE HCL 50 MG PO TABS: 75.0000 mg | ORAL_TABLET | Freq: Every day | ORAL | 2 refills | Status: DC

## 2019-09-04 MED ORDER — ALPRAZOLAM 1 MG PO TABS: 1.0000 mg | ORAL_TABLET | Freq: Every day | ORAL | 2 refills | Status: DC

## 2019-09-04 NOTE — Progress Notes (Signed)
Virtual Visit via Telephone Note  I connected with Mariah White on 09/04/19 at  1:00 PM EDT by telephone and verified that I am speaking with the correct person using two identifiers.   I discussed the limitations, risks, security and privacy concerns of performing an evaluation and management service by telephone and the availability of in person appointments. I also discussed with the patient that there may be a patient responsible charge related to this service. The patient expressed understanding and agreed to proceed.    I discussed the assessment and treatment plan with the patient. The patient was provided an opportunity to ask questions and all were answered. The patient agreed with the plan and demonstrated an understanding of the instructions.   The patient was advised to call back or seek an in-person evaluation if the symptoms worsen or if the condition fails to improve as anticipated.  I provided 15 minutes of non-face-to-face time during this encounter. Location: Provider office, patient home  Mariah Spiller, MD  [facility] MD/PA/NP OP Progress Note  09/04/2019 1:14 PM Mariah White  MRN:  [id number]  Chief Complaint:  Chief Complaint    Depression; Anxiety; Follow-up     HPI: This patient is a 69 year old separated white female who lives with her son and daughter-in-law and 2 grandchildren in [place].  She is on disability for anxiety and back pain.  In the past she had worked as a Educational psychologist.  The patient returns after about 6 weeks for follow-up regarding her depression and anxiety.  She is still working with in-home physical therapy to improve her mobility since she had a stroke several months ago.  She is walking with a walker and is practicing every day.  Last time she was not sleeping well so increased her trazodone seems to be helping.  She states that her mood is improved and she denies severe depression or suicidal ideation.  She denies significant anxiety or panic attacks.  She  thinks her medications are working well.  She was worried that she might be running out of Xanax but I have sent in refills last visit. Visit Diagnosis:    ICD-10-CM   1. Major depressive disorder, single episode, moderate (HCC)  F32.1     Past Psychiatric History: Long history of substance abuse, now in remission  Past Medical History:  Past Medical History:  Diagnosis Date  . Anxiety   . Arthritis   . CHF (congestive heart failure) ([place])   . Chronic back pain   . Chronic pain   . COPD (chronic obstructive pulmonary disease) ([place])   . Coronary artery disease   . Depression   . GERD (gastroesophageal reflux disease)   . Hypertension   . Migraine   . Stroke [facility])     Past Surgical History:  Procedure Laterality Date  . ABDOMINAL HYSTERECTOMY    . breast lump removed    . BREAST SURGERY    . CHOLECYSTECTOMY    . FOOT SURGERY      Family Psychiatric History: see below  Family History:  Family History  Problem Relation Age of Onset  . Bipolar disorder Daughter   . Diabetes Mother   . Stroke Father   . Diabetes Sister   . Diabetes Brother   . Diabetes Brother   . Colon cancer Sister   . Colon cancer Sister   . Pancreatic cancer Brother   . Heart attack Brother     Social History:  Social History   Socioeconomic  History  . Marital status: Single    Spouse name: Not on file  . Number of children: Not on file  . Years of education: Not on file  . Highest education level: Not on file  Occupational History  . Not on file  Tobacco Use  . Smoking status: Current Every Day Smoker    Packs/day: 1.00    Types: Cigarettes    Start date: 01/18/1959  . Smokeless tobacco: Never Used  . Tobacco comment: 1 pack per day   Substance and Sexual Activity  . Alcohol use: No  . Drug use: No  . Sexual activity: Not Currently  Other Topics Concern  . Not on file  Social History Narrative  . Not on file   Social Determinants of Health   Financial Resource Strain:   .  Difficulty of Paying Living Expenses:   Food Insecurity:   . Worried About Programme researcher, broadcasting/film/video in the Last Year:   . Barista in the Last Year:   Transportation Needs:   . Freight forwarder (Medical):   Marland Kitchen Lack of Transportation (Non-Medical):   Physical Activity:   . Days of Exercise per Week:   . Minutes of Exercise per Session:   Stress:   . Feeling of Stress :   Social Connections:   . Frequency of Communication with Friends and Family:   . Frequency of Social Gatherings with Friends and Family:   . Attends Religious Services:   . Active Member of Clubs or Organizations:   . Attends Banker Meetings:   Marland Kitchen Marital Status:     Allergies:  Allergies  Allergen Reactions  . Penicillins Itching    Has patient had a PCN reaction causing immediate rash, facial/tongue/throat swelling, SOB or lightheadedness with hypotension: Yes Has patient had a PCN reaction causing severe rash involving mucus membranes or skin necrosis: No Has patient had a PCN reaction that required hospitalization: No Has patient had a PCN reaction occurring within the last 10 years: No If all of the above answers are "NO", then may proceed with Cephalosporin use.     Metabolic Disorder Labs: No results found for: HGBA1C, MPG No results found for: PROLACTIN Lab Results  Component Value Date   TRIG 389 (H) 05/11/2017   No results found for: TSH  Therapeutic Level Labs: No results found for: LITHIUM No results found for: VALPROATE No components found for:  CBMZ  Current Medications: Current Outpatient Medications  Medication Sig Dispense Refill  . albuterol (PROVENTIL HFA;VENTOLIN HFA) 108 (90 BASE) MCG/ACT inhaler Inhale 2 puffs into the lungs 2 (two) times daily.    Marland Kitchen ALPRAZolam (XANAX) 1 MG tablet Take 1 tablet (1 mg total) by mouth at bedtime. 30 tablet 2  . amLODipine (NORVASC) 5 MG tablet Take 1 tablet by mouth daily.    . ARIPiprazole (ABILIFY) 5 MG tablet Take 1 tablet  (5 mg total) by mouth at bedtime. 90 tablet 2  . aspirin EC 81 MG tablet Take 81 mg by mouth daily.    . clopidogrel (PLAVIX) 75 MG tablet Take 75 mg by mouth daily.    . diclofenac sodium (VOLTAREN) 1 % GEL Apply 1 application topically every 4 (four) hours as needed.    Marland Kitchen FLUoxetine (PROZAC) 40 MG capsule Take 1 capsule (40 mg total) by mouth daily. 30 capsule 2  . furosemide (LASIX) 40 MG tablet Take 1 tablet (40 mg total) by mouth 2 (two) times daily. 60 tablet  11  . gabapentin (NEURONTIN) 600 MG tablet Take 600 mg 3 (three) times daily by mouth.  3  . ipratropium-albuterol (DUONEB) 0.5-2.5 (3) MG/3ML SOLN Inhale 3 mLs into the lungs every 6 (six) hours as needed.    . magnesium oxide (MAG-OX) 400 (241.3 Mg) MG tablet TAKE 1 TABLET BY MOUTH TWICE DAILY (Patient not taking: Reported on 08/08/2019) 180 tablet 2  . meloxicam (MOBIC) 7.5 MG tablet Take 7.5 mg by mouth daily.    . methocarbamol (ROBAXIN) 500 MG tablet Take 500 mg by mouth 2 (two) times daily as needed.    Marland Kitchen oxyCODONE (OXY IR/ROXICODONE) 5 MG immediate release tablet Take 5 mg by mouth every 6 (six) hours as needed.    . pravastatin (PRAVACHOL) 80 MG tablet Take 80 mg by mouth daily.    Marland Kitchen spironolactone (ALDACTONE) 25 MG tablet Take 25 mg by mouth daily.    . traZODone (DESYREL) 50 MG tablet Take 1.5 tablets (75 mg total) by mouth at bedtime. 45 tablet 2   No current facility-administered medications for this visit.     Musculoskeletal: Strength & Muscle Tone: decreased Gait & Station: unsteady Patient leans: N/A  Psychiatric Specialty Exam: Review of Systems  Musculoskeletal: Positive for arthralgias, back pain and gait problem.  Neurological: Positive for weakness.  All other systems reviewed and are negative.   There were no vitals taken for this visit.There is no height or weight on file to calculate BMI.  General Appearance: NA  Eye Contact:  NA  Speech:  Clear and Coherent  Volume:  Normal  Mood:  Euthymic   Affect:  NA  Thought Process:  Goal Directed  Orientation:  Full (Time, Place, and Person)  Thought Content: WDL   Suicidal Thoughts:  No  Homicidal Thoughts:  No  Memory:  Immediate;   Good Recent;   Good Remote;   Fair  Judgement:  Fair  Insight:  Fair  Psychomotor Activity:  Decreased  Concentration:  Concentration: Fair and Attention Span: Fair  Recall:  Good  Fund of Knowledge: Good  Language: Good  Akathisia:  No  Handed:  Right  AIMS (if indicated): not done  Assets:  Communication Skills Desire for Improvement Resilience Social Support Talents/Skills  ADL's:  Intact  Cognition: WNL  Sleep:  Good   Screenings:   Assessment and Plan: This patient is a 69 year old female with a history of depression anxiety and a remote history of substance abuse.  She is slowly recovering from her stroke.  She is doing well on her current regimen so she will continue trazodone 75 mg at bedtime for sleep, Prozac 40 mg daily for depression, Abilify 5 mg daily for augmentation and Xanax 1 mg at bedtime for anxiety.  She will return to see me in 3 months   Diannia Ruder, MD 09/04/2019, 1:14 PM

## 2019-09-08 DIAGNOSIS — I509 Heart failure, unspecified: Secondary | ICD-10-CM | POA: Diagnosis not present

## 2019-09-08 DIAGNOSIS — Z299 Encounter for prophylactic measures, unspecified: Secondary | ICD-10-CM | POA: Diagnosis not present

## 2019-09-08 DIAGNOSIS — F17210 Nicotine dependence, cigarettes, uncomplicated: Secondary | ICD-10-CM | POA: Diagnosis not present

## 2019-09-08 DIAGNOSIS — I10 Essential (primary) hypertension: Secondary | ICD-10-CM | POA: Diagnosis not present

## 2019-09-08 DIAGNOSIS — E1165 Type 2 diabetes mellitus with hyperglycemia: Secondary | ICD-10-CM | POA: Diagnosis not present

## 2019-09-08 DIAGNOSIS — E1122 Type 2 diabetes mellitus with diabetic chronic kidney disease: Secondary | ICD-10-CM | POA: Diagnosis not present

## 2019-09-09 DIAGNOSIS — G4733 Obstructive sleep apnea (adult) (pediatric): Secondary | ICD-10-CM | POA: Diagnosis not present

## 2019-09-09 DIAGNOSIS — I2510 Atherosclerotic heart disease of native coronary artery without angina pectoris: Secondary | ICD-10-CM | POA: Diagnosis not present

## 2019-09-09 DIAGNOSIS — Z9981 Dependence on supplemental oxygen: Secondary | ICD-10-CM | POA: Diagnosis not present

## 2019-09-09 DIAGNOSIS — J449 Chronic obstructive pulmonary disease, unspecified: Secondary | ICD-10-CM | POA: Diagnosis not present

## 2019-09-09 DIAGNOSIS — I5033 Acute on chronic diastolic (congestive) heart failure: Secondary | ICD-10-CM | POA: Diagnosis not present

## 2019-09-09 DIAGNOSIS — M6281 Muscle weakness (generalized): Secondary | ICD-10-CM | POA: Diagnosis not present

## 2019-09-09 DIAGNOSIS — G8929 Other chronic pain: Secondary | ICD-10-CM | POA: Diagnosis not present

## 2019-09-09 DIAGNOSIS — Z87440 Personal history of urinary (tract) infections: Secondary | ICD-10-CM | POA: Diagnosis not present

## 2019-09-09 DIAGNOSIS — M545 Low back pain: Secondary | ICD-10-CM | POA: Diagnosis not present

## 2019-09-09 DIAGNOSIS — I130 Hypertensive heart and chronic kidney disease with heart failure and stage 1 through stage 4 chronic kidney disease, or unspecified chronic kidney disease: Secondary | ICD-10-CM | POA: Diagnosis not present

## 2019-09-09 DIAGNOSIS — M1380 Other specified arthritis, unspecified site: Secondary | ICD-10-CM | POA: Diagnosis not present

## 2019-09-09 DIAGNOSIS — Z7902 Long term (current) use of antithrombotics/antiplatelets: Secondary | ICD-10-CM | POA: Diagnosis not present

## 2019-09-09 DIAGNOSIS — N1832 Chronic kidney disease, stage 3b: Secondary | ICD-10-CM | POA: Diagnosis not present

## 2019-09-09 DIAGNOSIS — Z8673 Personal history of transient ischemic attack (TIA), and cerebral infarction without residual deficits: Secondary | ICD-10-CM | POA: Diagnosis not present

## 2019-09-09 DIAGNOSIS — J9610 Chronic respiratory failure, unspecified whether with hypoxia or hypercapnia: Secondary | ICD-10-CM | POA: Diagnosis not present

## 2019-09-12 DIAGNOSIS — G4733 Obstructive sleep apnea (adult) (pediatric): Secondary | ICD-10-CM | POA: Diagnosis not present

## 2019-09-12 DIAGNOSIS — I5033 Acute on chronic diastolic (congestive) heart failure: Secondary | ICD-10-CM | POA: Diagnosis not present

## 2019-09-12 DIAGNOSIS — N1832 Chronic kidney disease, stage 3b: Secondary | ICD-10-CM | POA: Diagnosis not present

## 2019-09-12 DIAGNOSIS — J9610 Chronic respiratory failure, unspecified whether with hypoxia or hypercapnia: Secondary | ICD-10-CM | POA: Diagnosis not present

## 2019-09-12 DIAGNOSIS — Z9981 Dependence on supplemental oxygen: Secondary | ICD-10-CM | POA: Diagnosis not present

## 2019-09-12 DIAGNOSIS — M6281 Muscle weakness (generalized): Secondary | ICD-10-CM | POA: Diagnosis not present

## 2019-09-12 DIAGNOSIS — M1380 Other specified arthritis, unspecified site: Secondary | ICD-10-CM | POA: Diagnosis not present

## 2019-09-12 DIAGNOSIS — M545 Low back pain: Secondary | ICD-10-CM | POA: Diagnosis not present

## 2019-09-12 DIAGNOSIS — Z87440 Personal history of urinary (tract) infections: Secondary | ICD-10-CM | POA: Diagnosis not present

## 2019-09-12 DIAGNOSIS — Z8673 Personal history of transient ischemic attack (TIA), and cerebral infarction without residual deficits: Secondary | ICD-10-CM | POA: Diagnosis not present

## 2019-09-12 DIAGNOSIS — I2510 Atherosclerotic heart disease of native coronary artery without angina pectoris: Secondary | ICD-10-CM | POA: Diagnosis not present

## 2019-09-12 DIAGNOSIS — Z7902 Long term (current) use of antithrombotics/antiplatelets: Secondary | ICD-10-CM | POA: Diagnosis not present

## 2019-09-12 DIAGNOSIS — J449 Chronic obstructive pulmonary disease, unspecified: Secondary | ICD-10-CM | POA: Diagnosis not present

## 2019-09-12 DIAGNOSIS — G8929 Other chronic pain: Secondary | ICD-10-CM | POA: Diagnosis not present

## 2019-09-12 DIAGNOSIS — I130 Hypertensive heart and chronic kidney disease with heart failure and stage 1 through stage 4 chronic kidney disease, or unspecified chronic kidney disease: Secondary | ICD-10-CM | POA: Diagnosis not present

## 2019-09-17 DIAGNOSIS — Z87440 Personal history of urinary (tract) infections: Secondary | ICD-10-CM | POA: Diagnosis not present

## 2019-09-17 DIAGNOSIS — G8929 Other chronic pain: Secondary | ICD-10-CM | POA: Diagnosis not present

## 2019-09-17 DIAGNOSIS — I5033 Acute on chronic diastolic (congestive) heart failure: Secondary | ICD-10-CM | POA: Diagnosis not present

## 2019-09-17 DIAGNOSIS — J449 Chronic obstructive pulmonary disease, unspecified: Secondary | ICD-10-CM | POA: Diagnosis not present

## 2019-09-17 DIAGNOSIS — Z8673 Personal history of transient ischemic attack (TIA), and cerebral infarction without residual deficits: Secondary | ICD-10-CM | POA: Diagnosis not present

## 2019-09-17 DIAGNOSIS — M1380 Other specified arthritis, unspecified site: Secondary | ICD-10-CM | POA: Diagnosis not present

## 2019-09-17 DIAGNOSIS — J9610 Chronic respiratory failure, unspecified whether with hypoxia or hypercapnia: Secondary | ICD-10-CM | POA: Diagnosis not present

## 2019-09-17 DIAGNOSIS — G4733 Obstructive sleep apnea (adult) (pediatric): Secondary | ICD-10-CM | POA: Diagnosis not present

## 2019-09-17 DIAGNOSIS — N1832 Chronic kidney disease, stage 3b: Secondary | ICD-10-CM | POA: Diagnosis not present

## 2019-09-17 DIAGNOSIS — M6281 Muscle weakness (generalized): Secondary | ICD-10-CM | POA: Diagnosis not present

## 2019-09-17 DIAGNOSIS — Z7902 Long term (current) use of antithrombotics/antiplatelets: Secondary | ICD-10-CM | POA: Diagnosis not present

## 2019-09-17 DIAGNOSIS — I2510 Atherosclerotic heart disease of native coronary artery without angina pectoris: Secondary | ICD-10-CM | POA: Diagnosis not present

## 2019-09-17 DIAGNOSIS — Z9981 Dependence on supplemental oxygen: Secondary | ICD-10-CM | POA: Diagnosis not present

## 2019-09-17 DIAGNOSIS — M545 Low back pain: Secondary | ICD-10-CM | POA: Diagnosis not present

## 2019-09-17 DIAGNOSIS — I130 Hypertensive heart and chronic kidney disease with heart failure and stage 1 through stage 4 chronic kidney disease, or unspecified chronic kidney disease: Secondary | ICD-10-CM | POA: Diagnosis not present

## 2019-09-21 DIAGNOSIS — J449 Chronic obstructive pulmonary disease, unspecified: Secondary | ICD-10-CM | POA: Diagnosis not present

## 2019-09-23 DIAGNOSIS — I5033 Acute on chronic diastolic (congestive) heart failure: Secondary | ICD-10-CM | POA: Diagnosis not present

## 2019-09-23 DIAGNOSIS — Z7902 Long term (current) use of antithrombotics/antiplatelets: Secondary | ICD-10-CM | POA: Diagnosis not present

## 2019-09-23 DIAGNOSIS — I2510 Atherosclerotic heart disease of native coronary artery without angina pectoris: Secondary | ICD-10-CM | POA: Diagnosis not present

## 2019-09-23 DIAGNOSIS — J449 Chronic obstructive pulmonary disease, unspecified: Secondary | ICD-10-CM | POA: Diagnosis not present

## 2019-09-23 DIAGNOSIS — M1380 Other specified arthritis, unspecified site: Secondary | ICD-10-CM | POA: Diagnosis not present

## 2019-09-23 DIAGNOSIS — G4733 Obstructive sleep apnea (adult) (pediatric): Secondary | ICD-10-CM | POA: Diagnosis not present

## 2019-09-23 DIAGNOSIS — Z8673 Personal history of transient ischemic attack (TIA), and cerebral infarction without residual deficits: Secondary | ICD-10-CM | POA: Diagnosis not present

## 2019-09-23 DIAGNOSIS — I130 Hypertensive heart and chronic kidney disease with heart failure and stage 1 through stage 4 chronic kidney disease, or unspecified chronic kidney disease: Secondary | ICD-10-CM | POA: Diagnosis not present

## 2019-09-23 DIAGNOSIS — Z87440 Personal history of urinary (tract) infections: Secondary | ICD-10-CM | POA: Diagnosis not present

## 2019-09-23 DIAGNOSIS — Z9981 Dependence on supplemental oxygen: Secondary | ICD-10-CM | POA: Diagnosis not present

## 2019-09-23 DIAGNOSIS — N1832 Chronic kidney disease, stage 3b: Secondary | ICD-10-CM | POA: Diagnosis not present

## 2019-09-23 DIAGNOSIS — M545 Low back pain: Secondary | ICD-10-CM | POA: Diagnosis not present

## 2019-09-23 DIAGNOSIS — G8929 Other chronic pain: Secondary | ICD-10-CM | POA: Diagnosis not present

## 2019-09-23 DIAGNOSIS — M6281 Muscle weakness (generalized): Secondary | ICD-10-CM | POA: Diagnosis not present

## 2019-09-23 DIAGNOSIS — J9610 Chronic respiratory failure, unspecified whether with hypoxia or hypercapnia: Secondary | ICD-10-CM | POA: Diagnosis not present

## 2019-10-01 DIAGNOSIS — I509 Heart failure, unspecified: Secondary | ICD-10-CM | POA: Diagnosis not present

## 2019-10-01 DIAGNOSIS — Z87440 Personal history of urinary (tract) infections: Secondary | ICD-10-CM | POA: Diagnosis not present

## 2019-10-01 DIAGNOSIS — Z8673 Personal history of transient ischemic attack (TIA), and cerebral infarction without residual deficits: Secondary | ICD-10-CM | POA: Diagnosis not present

## 2019-10-01 DIAGNOSIS — I5033 Acute on chronic diastolic (congestive) heart failure: Secondary | ICD-10-CM | POA: Diagnosis not present

## 2019-10-01 DIAGNOSIS — J9610 Chronic respiratory failure, unspecified whether with hypoxia or hypercapnia: Secondary | ICD-10-CM | POA: Diagnosis not present

## 2019-10-01 DIAGNOSIS — I2510 Atherosclerotic heart disease of native coronary artery without angina pectoris: Secondary | ICD-10-CM | POA: Diagnosis not present

## 2019-10-01 DIAGNOSIS — G4733 Obstructive sleep apnea (adult) (pediatric): Secondary | ICD-10-CM | POA: Diagnosis not present

## 2019-10-01 DIAGNOSIS — M159 Polyosteoarthritis, unspecified: Secondary | ICD-10-CM | POA: Diagnosis not present

## 2019-10-01 DIAGNOSIS — G8929 Other chronic pain: Secondary | ICD-10-CM | POA: Diagnosis not present

## 2019-10-01 DIAGNOSIS — N1832 Chronic kidney disease, stage 3b: Secondary | ICD-10-CM | POA: Diagnosis not present

## 2019-10-01 DIAGNOSIS — M6281 Muscle weakness (generalized): Secondary | ICD-10-CM | POA: Diagnosis not present

## 2019-10-01 DIAGNOSIS — M1380 Other specified arthritis, unspecified site: Secondary | ICD-10-CM | POA: Diagnosis not present

## 2019-10-01 DIAGNOSIS — M545 Low back pain: Secondary | ICD-10-CM | POA: Diagnosis not present

## 2019-10-01 DIAGNOSIS — Z9981 Dependence on supplemental oxygen: Secondary | ICD-10-CM | POA: Diagnosis not present

## 2019-10-01 DIAGNOSIS — J449 Chronic obstructive pulmonary disease, unspecified: Secondary | ICD-10-CM | POA: Diagnosis not present

## 2019-10-01 DIAGNOSIS — I130 Hypertensive heart and chronic kidney disease with heart failure and stage 1 through stage 4 chronic kidney disease, or unspecified chronic kidney disease: Secondary | ICD-10-CM | POA: Diagnosis not present

## 2019-10-01 DIAGNOSIS — Z7902 Long term (current) use of antithrombotics/antiplatelets: Secondary | ICD-10-CM | POA: Diagnosis not present

## 2019-10-02 DIAGNOSIS — I509 Heart failure, unspecified: Secondary | ICD-10-CM | POA: Diagnosis not present

## 2019-10-02 DIAGNOSIS — E1165 Type 2 diabetes mellitus with hyperglycemia: Secondary | ICD-10-CM | POA: Diagnosis not present

## 2019-10-03 DIAGNOSIS — R809 Proteinuria, unspecified: Secondary | ICD-10-CM | POA: Diagnosis not present

## 2019-10-03 DIAGNOSIS — E559 Vitamin D deficiency, unspecified: Secondary | ICD-10-CM | POA: Diagnosis not present

## 2019-10-03 DIAGNOSIS — E211 Secondary hyperparathyroidism, not elsewhere classified: Secondary | ICD-10-CM | POA: Diagnosis not present

## 2019-10-03 DIAGNOSIS — I129 Hypertensive chronic kidney disease with stage 1 through stage 4 chronic kidney disease, or unspecified chronic kidney disease: Secondary | ICD-10-CM | POA: Diagnosis not present

## 2019-10-03 DIAGNOSIS — E875 Hyperkalemia: Secondary | ICD-10-CM | POA: Diagnosis not present

## 2019-10-07 DIAGNOSIS — Z7902 Long term (current) use of antithrombotics/antiplatelets: Secondary | ICD-10-CM | POA: Diagnosis not present

## 2019-10-07 DIAGNOSIS — Z87440 Personal history of urinary (tract) infections: Secondary | ICD-10-CM | POA: Diagnosis not present

## 2019-10-07 DIAGNOSIS — G8929 Other chronic pain: Secondary | ICD-10-CM | POA: Diagnosis not present

## 2019-10-07 DIAGNOSIS — J9610 Chronic respiratory failure, unspecified whether with hypoxia or hypercapnia: Secondary | ICD-10-CM | POA: Diagnosis not present

## 2019-10-07 DIAGNOSIS — N1832 Chronic kidney disease, stage 3b: Secondary | ICD-10-CM | POA: Diagnosis not present

## 2019-10-07 DIAGNOSIS — M1380 Other specified arthritis, unspecified site: Secondary | ICD-10-CM | POA: Diagnosis not present

## 2019-10-07 DIAGNOSIS — M545 Low back pain: Secondary | ICD-10-CM | POA: Diagnosis not present

## 2019-10-07 DIAGNOSIS — I5033 Acute on chronic diastolic (congestive) heart failure: Secondary | ICD-10-CM | POA: Diagnosis not present

## 2019-10-07 DIAGNOSIS — M6281 Muscle weakness (generalized): Secondary | ICD-10-CM | POA: Diagnosis not present

## 2019-10-07 DIAGNOSIS — Z8673 Personal history of transient ischemic attack (TIA), and cerebral infarction without residual deficits: Secondary | ICD-10-CM | POA: Diagnosis not present

## 2019-10-07 DIAGNOSIS — I130 Hypertensive heart and chronic kidney disease with heart failure and stage 1 through stage 4 chronic kidney disease, or unspecified chronic kidney disease: Secondary | ICD-10-CM | POA: Diagnosis not present

## 2019-10-07 DIAGNOSIS — I2510 Atherosclerotic heart disease of native coronary artery without angina pectoris: Secondary | ICD-10-CM | POA: Diagnosis not present

## 2019-10-07 DIAGNOSIS — J449 Chronic obstructive pulmonary disease, unspecified: Secondary | ICD-10-CM | POA: Diagnosis not present

## 2019-10-07 DIAGNOSIS — G4733 Obstructive sleep apnea (adult) (pediatric): Secondary | ICD-10-CM | POA: Diagnosis not present

## 2019-10-07 DIAGNOSIS — Z9981 Dependence on supplemental oxygen: Secondary | ICD-10-CM | POA: Diagnosis not present

## 2019-10-10 DIAGNOSIS — E559 Vitamin D deficiency, unspecified: Secondary | ICD-10-CM | POA: Diagnosis not present

## 2019-10-10 DIAGNOSIS — R809 Proteinuria, unspecified: Secondary | ICD-10-CM | POA: Diagnosis not present

## 2019-10-10 DIAGNOSIS — N170 Acute kidney failure with tubular necrosis: Secondary | ICD-10-CM | POA: Diagnosis not present

## 2019-10-10 DIAGNOSIS — I129 Hypertensive chronic kidney disease with stage 1 through stage 4 chronic kidney disease, or unspecified chronic kidney disease: Secondary | ICD-10-CM | POA: Diagnosis not present

## 2019-10-10 DIAGNOSIS — E211 Secondary hyperparathyroidism, not elsewhere classified: Secondary | ICD-10-CM | POA: Diagnosis not present

## 2019-10-21 DIAGNOSIS — J449 Chronic obstructive pulmonary disease, unspecified: Secondary | ICD-10-CM | POA: Diagnosis not present

## 2019-10-22 DIAGNOSIS — J441 Chronic obstructive pulmonary disease with (acute) exacerbation: Secondary | ICD-10-CM | POA: Diagnosis not present

## 2019-10-22 DIAGNOSIS — M545 Low back pain: Secondary | ICD-10-CM | POA: Diagnosis not present

## 2019-10-22 DIAGNOSIS — Z79891 Long term (current) use of opiate analgesic: Secondary | ICD-10-CM | POA: Diagnosis not present

## 2019-10-22 DIAGNOSIS — F17210 Nicotine dependence, cigarettes, uncomplicated: Secondary | ICD-10-CM | POA: Diagnosis not present

## 2019-10-22 DIAGNOSIS — G4700 Insomnia, unspecified: Secondary | ICD-10-CM | POA: Diagnosis not present

## 2019-10-22 DIAGNOSIS — M130 Polyarthritis, unspecified: Secondary | ICD-10-CM | POA: Diagnosis not present

## 2019-10-22 DIAGNOSIS — M25569 Pain in unspecified knee: Secondary | ICD-10-CM | POA: Diagnosis not present

## 2019-10-22 DIAGNOSIS — E1165 Type 2 diabetes mellitus with hyperglycemia: Secondary | ICD-10-CM | POA: Diagnosis not present

## 2019-10-22 DIAGNOSIS — Z299 Encounter for prophylactic measures, unspecified: Secondary | ICD-10-CM | POA: Diagnosis not present

## 2019-10-22 DIAGNOSIS — J45901 Unspecified asthma with (acute) exacerbation: Secondary | ICD-10-CM | POA: Diagnosis not present

## 2019-10-22 DIAGNOSIS — K529 Noninfective gastroenteritis and colitis, unspecified: Secondary | ICD-10-CM | POA: Diagnosis not present

## 2019-10-22 DIAGNOSIS — R350 Frequency of micturition: Secondary | ICD-10-CM | POA: Diagnosis not present

## 2019-10-27 DIAGNOSIS — K529 Noninfective gastroenteritis and colitis, unspecified: Secondary | ICD-10-CM | POA: Diagnosis not present

## 2019-10-29 ENCOUNTER — Telehealth (HOSPITAL_COMMUNITY): Payer: Self-pay | Admitting: Psychiatry

## 2019-10-29 NOTE — Telephone Encounter (Signed)
Called to schedule F/U appt, unable to leave message

## 2019-11-18 DIAGNOSIS — M159 Polyosteoarthritis, unspecified: Secondary | ICD-10-CM | POA: Diagnosis not present

## 2019-11-18 DIAGNOSIS — I2510 Atherosclerotic heart disease of native coronary artery without angina pectoris: Secondary | ICD-10-CM | POA: Diagnosis not present

## 2019-11-18 DIAGNOSIS — I509 Heart failure, unspecified: Secondary | ICD-10-CM | POA: Diagnosis not present

## 2019-11-19 DIAGNOSIS — Z299 Encounter for prophylactic measures, unspecified: Secondary | ICD-10-CM | POA: Diagnosis not present

## 2019-11-19 DIAGNOSIS — Z79899 Other long term (current) drug therapy: Secondary | ICD-10-CM | POA: Diagnosis not present

## 2019-11-19 DIAGNOSIS — R5383 Other fatigue: Secondary | ICD-10-CM | POA: Diagnosis not present

## 2019-11-19 DIAGNOSIS — E7800 Pure hypercholesterolemia, unspecified: Secondary | ICD-10-CM | POA: Diagnosis not present

## 2019-11-19 DIAGNOSIS — Z7189 Other specified counseling: Secondary | ICD-10-CM | POA: Diagnosis not present

## 2019-11-19 DIAGNOSIS — Z0000 Encounter for general adult medical examination without abnormal findings: Secondary | ICD-10-CM | POA: Diagnosis not present

## 2019-11-19 DIAGNOSIS — I509 Heart failure, unspecified: Secondary | ICD-10-CM | POA: Diagnosis not present

## 2019-11-19 DIAGNOSIS — F17210 Nicotine dependence, cigarettes, uncomplicated: Secondary | ICD-10-CM | POA: Diagnosis not present

## 2019-11-21 DIAGNOSIS — J449 Chronic obstructive pulmonary disease, unspecified: Secondary | ICD-10-CM | POA: Diagnosis not present

## 2019-12-02 ENCOUNTER — Other Ambulatory Visit (HOSPITAL_COMMUNITY): Payer: Self-pay | Admitting: Psychiatry

## 2019-12-02 NOTE — Telephone Encounter (Signed)
Not able to reach patient to sch f/u appt. Not able to leave message. Recording stated memory is full.

## 2019-12-02 NOTE — Telephone Encounter (Signed)
Call for appt

## 2019-12-16 ENCOUNTER — Other Ambulatory Visit: Payer: Self-pay | Admitting: Student

## 2019-12-16 NOTE — Telephone Encounter (Signed)
This is a [place] pt.  °

## 2019-12-22 DIAGNOSIS — I509 Heart failure, unspecified: Secondary | ICD-10-CM | POA: Diagnosis not present

## 2019-12-22 DIAGNOSIS — J449 Chronic obstructive pulmonary disease, unspecified: Secondary | ICD-10-CM | POA: Diagnosis not present

## 2019-12-22 DIAGNOSIS — Z299 Encounter for prophylactic measures, unspecified: Secondary | ICD-10-CM | POA: Diagnosis not present

## 2019-12-22 DIAGNOSIS — J441 Chronic obstructive pulmonary disease with (acute) exacerbation: Secondary | ICD-10-CM | POA: Diagnosis not present

## 2019-12-22 DIAGNOSIS — J45901 Unspecified asthma with (acute) exacerbation: Secondary | ICD-10-CM | POA: Diagnosis not present

## 2019-12-22 DIAGNOSIS — F17210 Nicotine dependence, cigarettes, uncomplicated: Secondary | ICD-10-CM | POA: Diagnosis not present

## 2019-12-22 DIAGNOSIS — I10 Essential (primary) hypertension: Secondary | ICD-10-CM | POA: Diagnosis not present

## 2019-12-24 DIAGNOSIS — Z1159 Encounter for screening for other viral diseases: Secondary | ICD-10-CM | POA: Diagnosis not present

## 2020-01-01 DIAGNOSIS — M159 Polyosteoarthritis, unspecified: Secondary | ICD-10-CM | POA: Diagnosis not present

## 2020-01-01 DIAGNOSIS — I509 Heart failure, unspecified: Secondary | ICD-10-CM | POA: Diagnosis not present

## 2020-01-01 DIAGNOSIS — I2510 Atherosclerotic heart disease of native coronary artery without angina pectoris: Secondary | ICD-10-CM | POA: Diagnosis not present

## 2020-01-05 ENCOUNTER — Other Ambulatory Visit: Payer: Self-pay | Admitting: Cardiology

## 2020-01-05 DIAGNOSIS — N170 Acute kidney failure with tubular necrosis: Secondary | ICD-10-CM | POA: Diagnosis not present

## 2020-01-05 DIAGNOSIS — E559 Vitamin D deficiency, unspecified: Secondary | ICD-10-CM | POA: Diagnosis not present

## 2020-01-05 DIAGNOSIS — I129 Hypertensive chronic kidney disease with stage 1 through stage 4 chronic kidney disease, or unspecified chronic kidney disease: Secondary | ICD-10-CM | POA: Diagnosis not present

## 2020-01-05 DIAGNOSIS — R809 Proteinuria, unspecified: Secondary | ICD-10-CM | POA: Diagnosis not present

## 2020-01-05 DIAGNOSIS — E211 Secondary hyperparathyroidism, not elsewhere classified: Secondary | ICD-10-CM | POA: Diagnosis not present

## 2020-01-13 DIAGNOSIS — Z7689 Persons encountering health services in other specified circumstances: Secondary | ICD-10-CM | POA: Diagnosis not present

## 2020-01-13 DIAGNOSIS — Z6832 Body mass index (BMI) 32.0-32.9, adult: Secondary | ICD-10-CM | POA: Diagnosis not present

## 2020-01-13 DIAGNOSIS — Z23 Encounter for immunization: Secondary | ICD-10-CM | POA: Diagnosis not present

## 2020-01-13 DIAGNOSIS — I509 Heart failure, unspecified: Secondary | ICD-10-CM | POA: Diagnosis not present

## 2020-01-13 DIAGNOSIS — F32A Depression, unspecified: Secondary | ICD-10-CM | POA: Diagnosis not present

## 2020-01-13 DIAGNOSIS — Z8673 Personal history of transient ischemic attack (TIA), and cerebral infarction without residual deficits: Secondary | ICD-10-CM | POA: Diagnosis not present

## 2020-01-13 DIAGNOSIS — N189 Chronic kidney disease, unspecified: Secondary | ICD-10-CM | POA: Diagnosis not present

## 2020-01-13 DIAGNOSIS — J449 Chronic obstructive pulmonary disease, unspecified: Secondary | ICD-10-CM | POA: Diagnosis not present

## 2020-01-13 DIAGNOSIS — I2510 Atherosclerotic heart disease of native coronary artery without angina pectoris: Secondary | ICD-10-CM | POA: Diagnosis not present

## 2020-01-22 DIAGNOSIS — Z23 Encounter for immunization: Secondary | ICD-10-CM | POA: Diagnosis not present

## 2020-01-22 DIAGNOSIS — N189 Chronic kidney disease, unspecified: Secondary | ICD-10-CM | POA: Diagnosis not present

## 2020-01-22 DIAGNOSIS — N1832 Chronic kidney disease, stage 3b: Secondary | ICD-10-CM | POA: Diagnosis not present

## 2020-01-22 DIAGNOSIS — Z6832 Body mass index (BMI) 32.0-32.9, adult: Secondary | ICD-10-CM | POA: Diagnosis not present

## 2020-01-22 DIAGNOSIS — I5032 Chronic diastolic (congestive) heart failure: Secondary | ICD-10-CM | POA: Diagnosis not present

## 2020-01-22 DIAGNOSIS — I129 Hypertensive chronic kidney disease with stage 1 through stage 4 chronic kidney disease, or unspecified chronic kidney disease: Secondary | ICD-10-CM | POA: Diagnosis not present

## 2020-01-22 DIAGNOSIS — J449 Chronic obstructive pulmonary disease, unspecified: Secondary | ICD-10-CM | POA: Diagnosis not present

## 2020-01-22 DIAGNOSIS — E211 Secondary hyperparathyroidism, not elsewhere classified: Secondary | ICD-10-CM | POA: Diagnosis not present

## 2020-01-22 DIAGNOSIS — E559 Vitamin D deficiency, unspecified: Secondary | ICD-10-CM | POA: Diagnosis not present

## 2020-01-22 DIAGNOSIS — F32A Depression, unspecified: Secondary | ICD-10-CM | POA: Diagnosis not present

## 2020-01-22 DIAGNOSIS — R809 Proteinuria, unspecified: Secondary | ICD-10-CM | POA: Diagnosis not present

## 2020-01-22 DIAGNOSIS — Z716 Tobacco abuse counseling: Secondary | ICD-10-CM | POA: Diagnosis not present

## 2020-01-22 DIAGNOSIS — E1169 Type 2 diabetes mellitus with other specified complication: Secondary | ICD-10-CM | POA: Diagnosis not present

## 2020-01-22 DIAGNOSIS — I509 Heart failure, unspecified: Secondary | ICD-10-CM | POA: Diagnosis not present

## 2020-01-22 DIAGNOSIS — Z8673 Personal history of transient ischemic attack (TIA), and cerebral infarction without residual deficits: Secondary | ICD-10-CM | POA: Diagnosis not present

## 2020-01-22 DIAGNOSIS — I2510 Atherosclerotic heart disease of native coronary artery without angina pectoris: Secondary | ICD-10-CM | POA: Diagnosis not present

## 2020-01-23 DIAGNOSIS — G4700 Insomnia, unspecified: Secondary | ICD-10-CM | POA: Diagnosis not present

## 2020-01-23 DIAGNOSIS — M25569 Pain in unspecified knee: Secondary | ICD-10-CM | POA: Diagnosis not present

## 2020-01-23 DIAGNOSIS — M130 Polyarthritis, unspecified: Secondary | ICD-10-CM | POA: Diagnosis not present

## 2020-01-23 DIAGNOSIS — Z79891 Long term (current) use of opiate analgesic: Secondary | ICD-10-CM | POA: Diagnosis not present

## 2020-01-23 DIAGNOSIS — M545 Low back pain, unspecified: Secondary | ICD-10-CM | POA: Diagnosis not present

## 2020-01-29 ENCOUNTER — Other Ambulatory Visit (HOSPITAL_COMMUNITY): Payer: Self-pay | Admitting: Psychiatry

## 2020-01-29 MED ORDER — TRAZODONE HCL 50 MG PO TABS
ORAL_TABLET | ORAL | 2 refills | Status: DC
Start: 2020-01-29 — End: 2020-06-23

## 2020-01-29 MED ORDER — ARIPIPRAZOLE 5 MG PO TABS: 5.0000 mg | ORAL_TABLET | Freq: Every day | ORAL | 2 refills | Status: DC

## 2020-01-29 MED ORDER — ALPRAZOLAM 1 MG PO TABS
1.0000 mg | ORAL_TABLET | Freq: Every day | ORAL | 2 refills | Status: DC
Start: 2020-01-29 — End: 2020-03-02

## 2020-01-29 NOTE — Telephone Encounter (Signed)
Call for appt

## 2020-02-10 DIAGNOSIS — Z8673 Personal history of transient ischemic attack (TIA), and cerebral infarction without residual deficits: Secondary | ICD-10-CM | POA: Diagnosis not present

## 2020-02-10 DIAGNOSIS — W19XXXA Unspecified fall, initial encounter: Secondary | ICD-10-CM | POA: Diagnosis not present

## 2020-02-10 DIAGNOSIS — Z9981 Dependence on supplemental oxygen: Secondary | ICD-10-CM | POA: Diagnosis not present

## 2020-02-10 DIAGNOSIS — Z6832 Body mass index (BMI) 32.0-32.9, adult: Secondary | ICD-10-CM | POA: Diagnosis not present

## 2020-02-10 DIAGNOSIS — I2510 Atherosclerotic heart disease of native coronary artery without angina pectoris: Secondary | ICD-10-CM | POA: Diagnosis not present

## 2020-02-10 DIAGNOSIS — F17210 Nicotine dependence, cigarettes, uncomplicated: Secondary | ICD-10-CM | POA: Diagnosis not present

## 2020-02-10 DIAGNOSIS — I5032 Chronic diastolic (congestive) heart failure: Secondary | ICD-10-CM | POA: Diagnosis not present

## 2020-02-10 DIAGNOSIS — N1832 Chronic kidney disease, stage 3b: Secondary | ICD-10-CM | POA: Diagnosis not present

## 2020-02-10 DIAGNOSIS — E1151 Type 2 diabetes mellitus with diabetic peripheral angiopathy without gangrene: Secondary | ICD-10-CM | POA: Diagnosis not present

## 2020-02-10 DIAGNOSIS — I700 Atherosclerosis of aorta: Secondary | ICD-10-CM | POA: Diagnosis not present

## 2020-02-10 DIAGNOSIS — E1169 Type 2 diabetes mellitus with other specified complication: Secondary | ICD-10-CM | POA: Diagnosis not present

## 2020-02-10 DIAGNOSIS — J449 Chronic obstructive pulmonary disease, unspecified: Secondary | ICD-10-CM | POA: Diagnosis not present

## 2020-02-10 DIAGNOSIS — I509 Heart failure, unspecified: Secondary | ICD-10-CM | POA: Diagnosis not present

## 2020-02-10 DIAGNOSIS — I252 Old myocardial infarction: Secondary | ICD-10-CM | POA: Diagnosis not present

## 2020-02-10 DIAGNOSIS — Z0001 Encounter for general adult medical examination with abnormal findings: Secondary | ICD-10-CM | POA: Diagnosis not present

## 2020-02-10 DIAGNOSIS — R9431 Abnormal electrocardiogram [ECG] [EKG]: Secondary | ICD-10-CM | POA: Diagnosis not present

## 2020-02-10 DIAGNOSIS — R1084 Generalized abdominal pain: Secondary | ICD-10-CM | POA: Diagnosis not present

## 2020-02-10 DIAGNOSIS — Z8719 Personal history of other diseases of the digestive system: Secondary | ICD-10-CM | POA: Diagnosis not present

## 2020-02-11 DIAGNOSIS — I517 Cardiomegaly: Secondary | ICD-10-CM | POA: Diagnosis not present

## 2020-02-11 DIAGNOSIS — R42 Dizziness and giddiness: Secondary | ICD-10-CM | POA: Diagnosis not present

## 2020-02-11 DIAGNOSIS — I509 Heart failure, unspecified: Secondary | ICD-10-CM | POA: Diagnosis not present

## 2020-02-11 DIAGNOSIS — R0602 Shortness of breath: Secondary | ICD-10-CM | POA: Diagnosis not present

## 2020-02-13 ENCOUNTER — Ambulatory Visit (INDEPENDENT_AMBULATORY_CARE_PROVIDER_SITE_OTHER): Payer: Medicare HMO | Admitting: Cardiology

## 2020-02-13 ENCOUNTER — Encounter: Payer: Self-pay | Admitting: Cardiology

## 2020-02-13 VITALS — BP 106/72 | HR 74 | Ht 61.0 in | Wt 162.0 lb

## 2020-02-13 DIAGNOSIS — E782 Mixed hyperlipidemia: Secondary | ICD-10-CM

## 2020-02-13 DIAGNOSIS — I10 Essential (primary) hypertension: Secondary | ICD-10-CM

## 2020-02-13 DIAGNOSIS — I5032 Chronic diastolic (congestive) heart failure: Secondary | ICD-10-CM | POA: Diagnosis not present

## 2020-02-13 NOTE — Progress Notes (Signed)
Clinical Summary Ms. Grieves is a 69 y.o.female seen today for follow up of the following medical problems.  1. SOB/ChronicdiasotlicHF - started 1 year ago. Can occur at rest or with activity. Can have some coughing or wheezing. Can have some chest tightness at times. Midchest, 4/10 in severity. Tightness can occur at rest or with activity. Can be positional. Lasts a few minutes - has had some recent LE edema. Started about 4 months. Started on lasix a few months ago by  - 08/2016 echo: LVEF >75%, abnormal diastolic function per report. Normal LA, normal RV. Regarding diastolic function E/A 0.9, lateral e' 9cm, E/e' 8, decel time not reported, no normal LA.   - some LE edema at times - no SOB or DOE - home weight 162 lbs.    2. COPD - followed by pcp    3. History of CVA - she has been on ASA and plavixby another provider. Not for cardiac reason  4. HTN -compliant with meds   5. CKD III - followed by Dr Wolfgang Phoenix - has proteinuria but not on RAS blockers due to propensity of AKI and hyperkalemia     Has had covid vaccine x 2.     Past Medical History:  Diagnosis Date  . Anxiety   . Arthritis   . CHF (congestive heart failure) (HCC)   . Chronic back pain   . Chronic pain   . COPD (chronic obstructive pulmonary disease) (HCC)   . Coronary artery disease   . Depression   . GERD (gastroesophageal reflux disease)   . Hypertension   . Migraine   . Stroke [facility])      Allergies  Allergen Reactions  . Penicillins Itching    Has patient had a PCN reaction causing immediate rash, facial/tongue/throat swelling, SOB or lightheadedness with hypotension: Yes Has patient had a PCN reaction causing severe rash involving mucus membranes or skin necrosis: No Has patient had a PCN reaction that required hospitalization: No Has patient had a PCN reaction occurring within the last 10 years: No If all of the above answers are "NO", then may proceed with  Cephalosporin use.      Current Outpatient Medications  Medication Sig Dispense Refill  . albuterol (PROVENTIL HFA;VENTOLIN HFA) 108 (90 BASE) MCG/ACT inhaler Inhale 2 puffs into the lungs 2 (two) times daily.    Marland Kitchen ALPRAZolam (XANAX) 1 MG tablet Take 1 tablet (1 mg total) by mouth at bedtime. 30 tablet 2  . amLODipine (NORVASC) 5 MG tablet Take 1 tablet by mouth daily.    . ARIPiprazole (ABILIFY) 5 MG tablet Take 1 tablet (5 mg total) by mouth at bedtime. 90 tablet 2  . aspirin EC 81 MG tablet Take 81 mg by mouth daily.    . clopidogrel (PLAVIX) 75 MG tablet Take 75 mg by mouth daily.    . diclofenac sodium (VOLTAREN) 1 % GEL Apply 1 application topically every 4 (four) hours as needed.    Marland Kitchen FLUoxetine (PROZAC) 40 MG capsule TAKE ONE CAPSULE BY MOUTH EVERY DAY 30 capsule 2  . furosemide (LASIX) 40 MG tablet TAKE 1 TABLET BY MOUTH TWICE DAILY EMERGENCY REFILL FAXED DR. 60 tablet 6  . gabapentin (NEURONTIN) 600 MG tablet Take 600 mg 3 (three) times daily by mouth.  3  . ipratropium-albuterol (DUONEB) 0.5-2.5 (3) MG/3ML SOLN Inhale 3 mLs into the lungs every 6 (six) hours as needed.    . magnesium oxide (MAG-OX) 400 (241.3 Mg) MG tablet TAKE  1 TABLET BY MOUTH TWICE DAILY 180 tablet 2  . meloxicam (MOBIC) 7.5 MG tablet Take 7.5 mg by mouth daily.    . methocarbamol (ROBAXIN) 500 MG tablet Take 500 mg by mouth 2 (two) times daily as needed.    Marland Kitchen oxyCODONE (OXY IR/ROXICODONE) 5 MG immediate release tablet Take 5 mg by mouth every 6 (six) hours as needed.    . pravastatin (PRAVACHOL) 80 MG tablet Take 80 mg by mouth daily.    Marland Kitchen spironolactone (ALDACTONE) 25 MG tablet Take 25 mg by mouth daily.    . traZODone (DESYREL) 50 MG tablet TAKE 1 AND 1/2 TABLETS BY MOUTH AT BEDTIME 135 tablet 2   No current facility-administered medications for this visit.     Past Surgical History:  Procedure Laterality Date  . ABDOMINAL HYSTERECTOMY    . breast lump removed    . BREAST SURGERY    .  CHOLECYSTECTOMY    . FOOT SURGERY       Allergies  Allergen Reactions  . Penicillins Itching    Has patient had a PCN reaction causing immediate rash, facial/tongue/throat swelling, SOB or lightheadedness with hypotension: Yes Has patient had a PCN reaction causing severe rash involving mucus membranes or skin necrosis: No Has patient had a PCN reaction that required hospitalization: No Has patient had a PCN reaction occurring within the last 10 years: No If all of the above answers are "NO", then may proceed with Cephalosporin use.       Family History  Problem Relation Age of Onset  . Bipolar disorder Daughter   . Diabetes Mother   . Stroke Father   . Diabetes Sister   . Diabetes Brother   . Diabetes Brother   . Colon cancer Sister   . Colon cancer Sister   . Pancreatic cancer Brother   . Heart attack Brother      Social History Ms. Wernette reports that she has been smoking cigarettes. She started smoking about 61 years ago. She has been smoking about 1.00 pack per day. She has never used smokeless tobacco. Ms. Epping reports no history of alcohol use.   Review of Systems CONSTITUTIONAL: No weight loss, fever, chills, weakness or fatigue.  HEENT: Eyes: No visual loss, blurred vision, double vision or yellow sclerae.No hearing loss, sneezing, congestion, runny nose or sore throat.  SKIN: No rash or itching.  CARDIOVASCULAR: per hpi RESPIRATORY: No shortness of breath, cough or sputum.  GASTROINTESTINAL: No anorexia, nausea, vomiting or diarrhea. No abdominal pain or blood.  GENITOURINARY: No burning on urination, no polyuria NEUROLOGICAL: No headache, dizziness, syncope, paralysis, ataxia, numbness or tingling in the extremities. No change in bowel or bladder control.  MUSCULOSKELETAL: No muscle, back pain, joint pain or stiffness.  LYMPHATICS: No enlarged nodes. No history of splenectomy.  PSYCHIATRIC: No history of depression or anxiety.  ENDOCRINOLOGIC: No reports  of sweating, cold or heat intolerance. No polyuria or polydipsia.  Marland Kitchen   Physical Examination Today's Vitals   02/13/20 1136  BP: 106/72  Pulse: 74  SpO2: 94%  Weight: 162 lb (73.5 kg)  Height: 5\' 1"  (1.549 m)   Body mass index is 30.61 kg/m.  Gen: resting comfortably, no acute distress HEENT: no scleral icterus, pupils equal round and reactive, no palptable cervical adenopathy,  CV: RRR, no m/r/g, no jvd Resp: Clear to auscultation bilaterally GI: abdomen is soft, non-tender, non-distended, normal bowel sounds, no hepatosplenomegaly MSK: extremities are warm,1+bilateral LE edema Skin: warm, no rash Neuro:  no  focal deficits Psych: appropriate affect   Diagnostic Studies     Assessment and Plan  1.Chronic diastolic HF - Careful diuretic dosing due to renal dysfnction - mild LE edema today but no pulmonary symptmos, continue current diuretic   2. HTN -at goal, continue current meds   3. Hyperlipidemia - request pcp labs, continue statin   EKG today shows NSR, on acute ischemic changes     Antoine Poche, M.D.

## 2020-02-13 NOTE — Patient Instructions (Signed)

## 2020-02-17 DIAGNOSIS — F32A Depression, unspecified: Secondary | ICD-10-CM | POA: Diagnosis not present

## 2020-02-17 DIAGNOSIS — Z9181 History of falling: Secondary | ICD-10-CM | POA: Diagnosis not present

## 2020-02-17 DIAGNOSIS — I2510 Atherosclerotic heart disease of native coronary artery without angina pectoris: Secondary | ICD-10-CM | POA: Diagnosis not present

## 2020-02-17 DIAGNOSIS — Z8673 Personal history of transient ischemic attack (TIA), and cerebral infarction without residual deficits: Secondary | ICD-10-CM | POA: Diagnosis not present

## 2020-02-17 DIAGNOSIS — N189 Chronic kidney disease, unspecified: Secondary | ICD-10-CM | POA: Diagnosis not present

## 2020-02-17 DIAGNOSIS — Z6832 Body mass index (BMI) 32.0-32.9, adult: Secondary | ICD-10-CM | POA: Diagnosis not present

## 2020-02-17 DIAGNOSIS — J449 Chronic obstructive pulmonary disease, unspecified: Secondary | ICD-10-CM | POA: Diagnosis not present

## 2020-02-17 DIAGNOSIS — I509 Heart failure, unspecified: Secondary | ICD-10-CM | POA: Diagnosis not present

## 2020-03-02 ENCOUNTER — Other Ambulatory Visit (HOSPITAL_COMMUNITY): Payer: Self-pay | Admitting: Psychiatry

## 2020-03-02 NOTE — Telephone Encounter (Signed)
Call for appt

## 2020-03-04 DIAGNOSIS — F32A Depression, unspecified: Secondary | ICD-10-CM | POA: Diagnosis not present

## 2020-03-04 DIAGNOSIS — I509 Heart failure, unspecified: Secondary | ICD-10-CM | POA: Diagnosis not present

## 2020-03-04 DIAGNOSIS — N189 Chronic kidney disease, unspecified: Secondary | ICD-10-CM | POA: Diagnosis not present

## 2020-03-04 DIAGNOSIS — J449 Chronic obstructive pulmonary disease, unspecified: Secondary | ICD-10-CM | POA: Diagnosis not present

## 2020-03-04 DIAGNOSIS — I2510 Atherosclerotic heart disease of native coronary artery without angina pectoris: Secondary | ICD-10-CM | POA: Diagnosis not present

## 2020-03-04 DIAGNOSIS — Z9181 History of falling: Secondary | ICD-10-CM | POA: Diagnosis not present

## 2020-03-04 DIAGNOSIS — Z8673 Personal history of transient ischemic attack (TIA), and cerebral infarction without residual deficits: Secondary | ICD-10-CM | POA: Diagnosis not present

## 2020-03-08 ENCOUNTER — Telehealth (HOSPITAL_COMMUNITY): Payer: Medicaid Other | Admitting: Psychiatry

## 2020-03-08 ENCOUNTER — Other Ambulatory Visit: Payer: Self-pay

## 2020-04-02 IMAGING — CR DG CHEST 1V PORT
1 series · 1 of 1 positions shown · non-contrast
Comparison: 05/12/2017

CLINICAL DATA: Chest pain and weakness.

EXAM:
PORTABLE CHEST 1 VIEW

[portable]
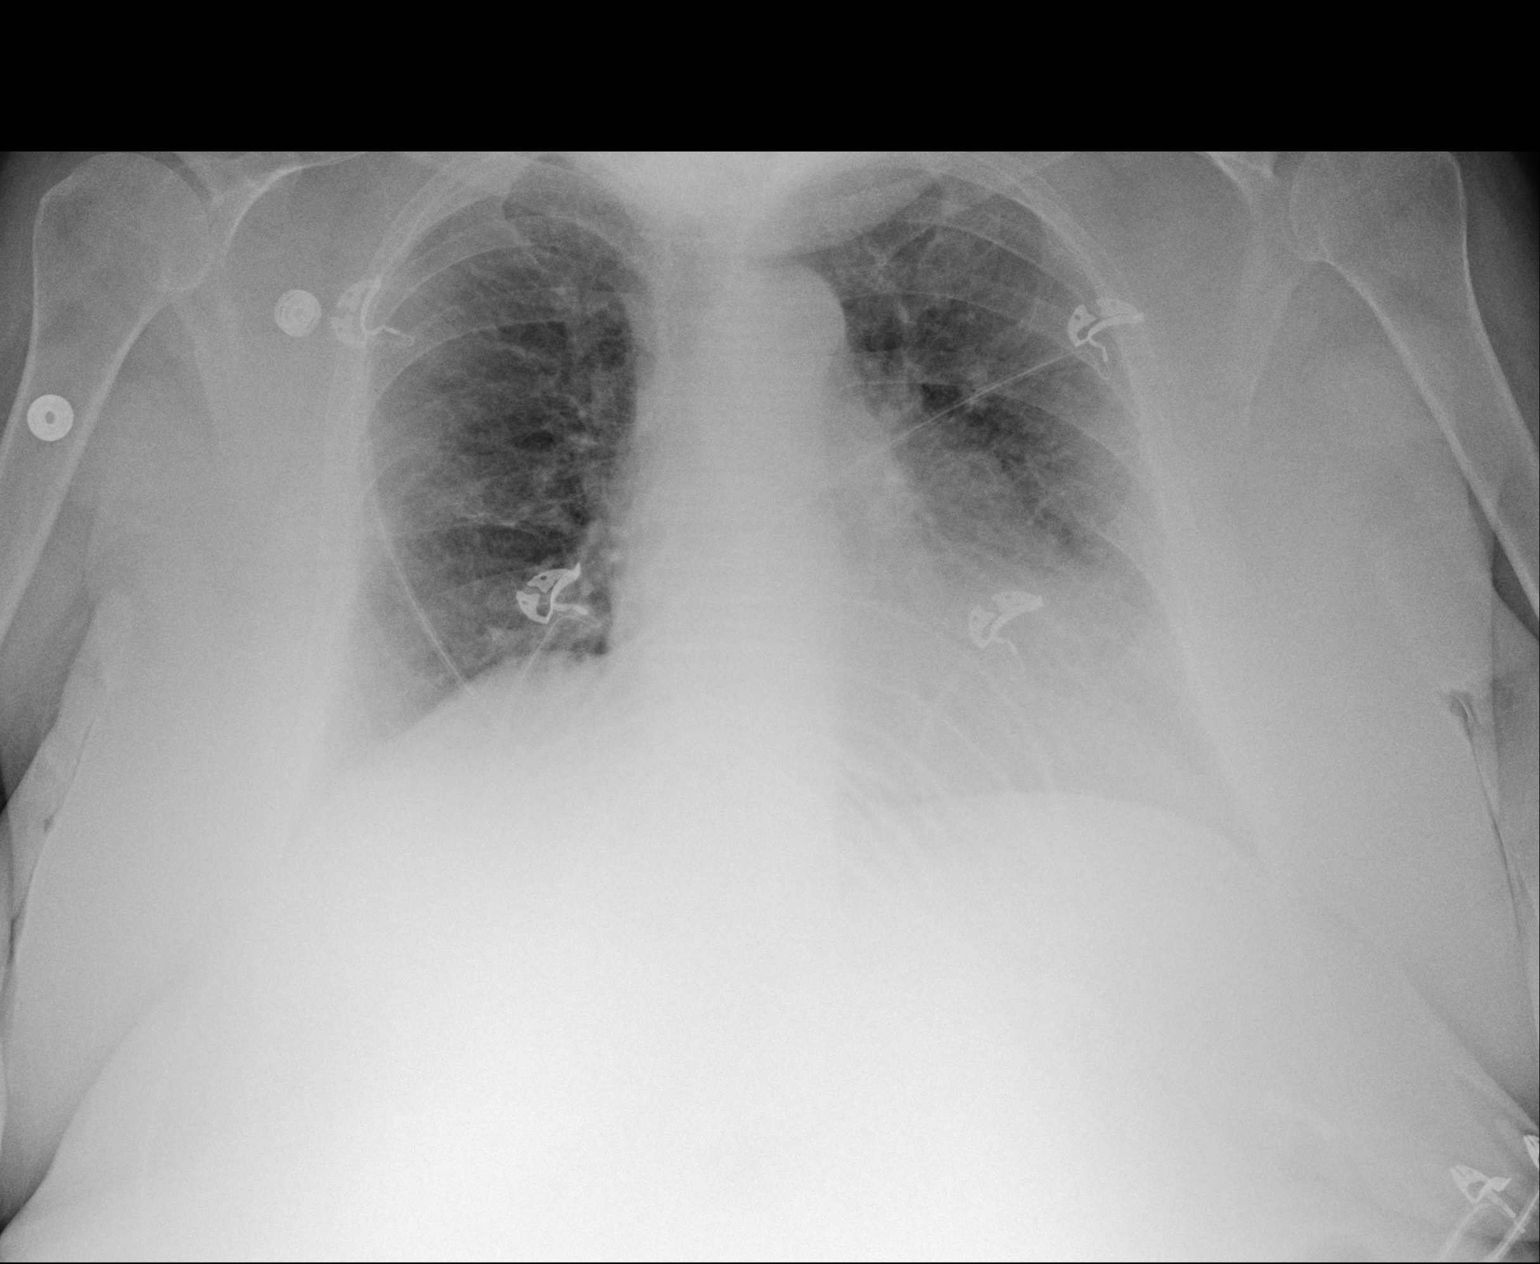

[1 of 1 positions shown; findings below may reference images not displayed]

FINDINGS: The heart size and mediastinal contours are within normal limits.
Low bilateral lung volumes. There is no evidence of pulmonary edema,
consolidation, pneumothorax, nodule or pleural fluid. The visualized
skeletal structures are unremarkable.
IMPRESSION: No active disease.

## 2020-04-14 NOTE — Telephone Encounter (Signed)
Called number on file and was not able to reach pt and number on file does not have a voicemail. Its asking for a remote access code

## 2020-04-21 DIAGNOSIS — Z8673 Personal history of transient ischemic attack (TIA), and cerebral infarction without residual deficits: Secondary | ICD-10-CM | POA: Diagnosis not present

## 2020-04-21 DIAGNOSIS — E7800 Pure hypercholesterolemia, unspecified: Secondary | ICD-10-CM | POA: Diagnosis not present

## 2020-04-21 DIAGNOSIS — G4700 Insomnia, unspecified: Secondary | ICD-10-CM | POA: Diagnosis not present

## 2020-04-21 DIAGNOSIS — M545 Low back pain, unspecified: Secondary | ICD-10-CM | POA: Diagnosis not present

## 2020-04-21 DIAGNOSIS — Z79891 Long term (current) use of opiate analgesic: Secondary | ICD-10-CM | POA: Diagnosis not present

## 2020-04-21 DIAGNOSIS — M130 Polyarthritis, unspecified: Secondary | ICD-10-CM | POA: Diagnosis not present

## 2020-04-21 DIAGNOSIS — M25569 Pain in unspecified knee: Secondary | ICD-10-CM | POA: Diagnosis not present

## 2020-04-21 DIAGNOSIS — N189 Chronic kidney disease, unspecified: Secondary | ICD-10-CM | POA: Diagnosis not present

## 2020-04-21 DIAGNOSIS — J449 Chronic obstructive pulmonary disease, unspecified: Secondary | ICD-10-CM | POA: Diagnosis not present

## 2020-04-21 DIAGNOSIS — N1832 Chronic kidney disease, stage 3b: Secondary | ICD-10-CM | POA: Diagnosis not present

## 2020-04-21 DIAGNOSIS — I509 Heart failure, unspecified: Secondary | ICD-10-CM | POA: Diagnosis not present

## 2020-04-21 DIAGNOSIS — Z23 Encounter for immunization: Secondary | ICD-10-CM | POA: Diagnosis not present

## 2020-04-21 DIAGNOSIS — E1165 Type 2 diabetes mellitus with hyperglycemia: Secondary | ICD-10-CM | POA: Diagnosis not present

## 2020-04-21 DIAGNOSIS — I2510 Atherosclerotic heart disease of native coronary artery without angina pectoris: Secondary | ICD-10-CM | POA: Diagnosis not present

## 2020-04-22 DIAGNOSIS — J449 Chronic obstructive pulmonary disease, unspecified: Secondary | ICD-10-CM | POA: Diagnosis not present

## 2020-04-27 DIAGNOSIS — I700 Atherosclerosis of aorta: Secondary | ICD-10-CM | POA: Diagnosis not present

## 2020-04-27 DIAGNOSIS — J449 Chronic obstructive pulmonary disease, unspecified: Secondary | ICD-10-CM | POA: Diagnosis not present

## 2020-04-27 DIAGNOSIS — Z8673 Personal history of transient ischemic attack (TIA), and cerebral infarction without residual deficits: Secondary | ICD-10-CM | POA: Diagnosis not present

## 2020-04-27 DIAGNOSIS — N1832 Chronic kidney disease, stage 3b: Secondary | ICD-10-CM | POA: Diagnosis not present

## 2020-04-27 DIAGNOSIS — I509 Heart failure, unspecified: Secondary | ICD-10-CM | POA: Diagnosis not present

## 2020-04-27 DIAGNOSIS — I2510 Atherosclerotic heart disease of native coronary artery without angina pectoris: Secondary | ICD-10-CM | POA: Diagnosis not present

## 2020-05-06 DIAGNOSIS — R809 Proteinuria, unspecified: Secondary | ICD-10-CM | POA: Diagnosis not present

## 2020-05-06 DIAGNOSIS — I129 Hypertensive chronic kidney disease with stage 1 through stage 4 chronic kidney disease, or unspecified chronic kidney disease: Secondary | ICD-10-CM | POA: Diagnosis not present

## 2020-05-06 DIAGNOSIS — I5032 Chronic diastolic (congestive) heart failure: Secondary | ICD-10-CM | POA: Diagnosis not present

## 2020-05-06 DIAGNOSIS — E876 Hypokalemia: Secondary | ICD-10-CM | POA: Diagnosis not present

## 2020-05-06 DIAGNOSIS — E211 Secondary hyperparathyroidism, not elsewhere classified: Secondary | ICD-10-CM | POA: Diagnosis not present

## 2020-05-23 DIAGNOSIS — J449 Chronic obstructive pulmonary disease, unspecified: Secondary | ICD-10-CM | POA: Diagnosis not present

## 2020-05-31 DIAGNOSIS — J449 Chronic obstructive pulmonary disease, unspecified: Secondary | ICD-10-CM | POA: Diagnosis not present

## 2020-05-31 DIAGNOSIS — N1832 Chronic kidney disease, stage 3b: Secondary | ICD-10-CM | POA: Diagnosis not present

## 2020-05-31 DIAGNOSIS — E7800 Pure hypercholesterolemia, unspecified: Secondary | ICD-10-CM | POA: Diagnosis not present

## 2020-05-31 DIAGNOSIS — I509 Heart failure, unspecified: Secondary | ICD-10-CM | POA: Diagnosis not present

## 2020-05-31 DIAGNOSIS — I2510 Atherosclerotic heart disease of native coronary artery without angina pectoris: Secondary | ICD-10-CM | POA: Diagnosis not present

## 2020-06-07 ENCOUNTER — Other Ambulatory Visit: Payer: Self-pay

## 2020-06-07 ENCOUNTER — Encounter (HOSPITAL_COMMUNITY): Payer: Medicaid Other | Admitting: Psychiatry

## 2020-06-08 DIAGNOSIS — N1832 Chronic kidney disease, stage 3b: Secondary | ICD-10-CM | POA: Diagnosis not present

## 2020-06-08 DIAGNOSIS — I700 Atherosclerosis of aorta: Secondary | ICD-10-CM | POA: Diagnosis not present

## 2020-06-08 DIAGNOSIS — M25522 Pain in left elbow: Secondary | ICD-10-CM | POA: Diagnosis not present

## 2020-06-08 DIAGNOSIS — Z8673 Personal history of transient ischemic attack (TIA), and cerebral infarction without residual deficits: Secondary | ICD-10-CM | POA: Diagnosis not present

## 2020-06-08 DIAGNOSIS — M25532 Pain in left wrist: Secondary | ICD-10-CM | POA: Diagnosis not present

## 2020-06-08 DIAGNOSIS — J449 Chronic obstructive pulmonary disease, unspecified: Secondary | ICD-10-CM | POA: Diagnosis not present

## 2020-06-08 DIAGNOSIS — I509 Heart failure, unspecified: Secondary | ICD-10-CM | POA: Diagnosis not present

## 2020-06-09 ENCOUNTER — Other Ambulatory Visit (HOSPITAL_COMMUNITY): Payer: Self-pay | Admitting: Psychiatry

## 2020-06-11 DIAGNOSIS — I2510 Atherosclerotic heart disease of native coronary artery without angina pectoris: Secondary | ICD-10-CM | POA: Diagnosis not present

## 2020-06-11 DIAGNOSIS — N183 Chronic kidney disease, stage 3 unspecified: Secondary | ICD-10-CM | POA: Diagnosis not present

## 2020-06-11 DIAGNOSIS — M25532 Pain in left wrist: Secondary | ICD-10-CM | POA: Diagnosis not present

## 2020-06-11 DIAGNOSIS — E1169 Type 2 diabetes mellitus with other specified complication: Secondary | ICD-10-CM | POA: Diagnosis not present

## 2020-06-11 DIAGNOSIS — M25512 Pain in left shoulder: Secondary | ICD-10-CM | POA: Diagnosis not present

## 2020-06-11 DIAGNOSIS — E11622 Type 2 diabetes mellitus with other skin ulcer: Secondary | ICD-10-CM | POA: Diagnosis not present

## 2020-06-11 DIAGNOSIS — M79605 Pain in left leg: Secondary | ICD-10-CM | POA: Diagnosis not present

## 2020-06-11 DIAGNOSIS — M25522 Pain in left elbow: Secondary | ICD-10-CM | POA: Diagnosis not present

## 2020-06-15 ENCOUNTER — Encounter: Payer: Self-pay | Admitting: Internal Medicine

## 2020-06-15 DIAGNOSIS — N1832 Chronic kidney disease, stage 3b: Secondary | ICD-10-CM | POA: Diagnosis not present

## 2020-06-15 DIAGNOSIS — I509 Heart failure, unspecified: Secondary | ICD-10-CM | POA: Diagnosis not present

## 2020-06-15 DIAGNOSIS — Z8673 Personal history of transient ischemic attack (TIA), and cerebral infarction without residual deficits: Secondary | ICD-10-CM | POA: Diagnosis not present

## 2020-06-15 DIAGNOSIS — I2510 Atherosclerotic heart disease of native coronary artery without angina pectoris: Secondary | ICD-10-CM | POA: Diagnosis not present

## 2020-06-15 DIAGNOSIS — I700 Atherosclerosis of aorta: Secondary | ICD-10-CM | POA: Diagnosis not present

## 2020-06-15 DIAGNOSIS — J449 Chronic obstructive pulmonary disease, unspecified: Secondary | ICD-10-CM | POA: Diagnosis not present

## 2020-06-20 DIAGNOSIS — J449 Chronic obstructive pulmonary disease, unspecified: Secondary | ICD-10-CM | POA: Diagnosis not present

## 2020-06-21 ENCOUNTER — Other Ambulatory Visit (HOSPITAL_COMMUNITY): Payer: Self-pay | Admitting: Psychiatry

## 2020-06-22 ENCOUNTER — Telehealth (HOSPITAL_COMMUNITY): Payer: Self-pay

## 2020-06-22 NOTE — Telephone Encounter (Signed)
Ok, she has already missed 2 appts recently, she must attend this one

## 2020-06-22 NOTE — Telephone Encounter (Signed)
Medication management - telephone call with patient after she left a message she is in need of medication refills to remind her of scheduled virtual visit with Dr. Tenny Craw on 06/23/20 at 11:20 am.  Pt agreed to keep appt as has medications to last until then

## 2020-06-23 ENCOUNTER — Other Ambulatory Visit: Payer: Self-pay

## 2020-06-23 ENCOUNTER — Encounter (HOSPITAL_COMMUNITY): Payer: Self-pay | Admitting: Psychiatry

## 2020-06-23 ENCOUNTER — Telehealth (INDEPENDENT_AMBULATORY_CARE_PROVIDER_SITE_OTHER): Payer: Medicare Other | Admitting: Psychiatry

## 2020-06-23 DIAGNOSIS — F321 Major depressive disorder, single episode, moderate: Secondary | ICD-10-CM

## 2020-06-23 MED ORDER — ALPRAZOLAM 1 MG PO TABS
1.0000 mg | ORAL_TABLET | Freq: Every day | ORAL | 2 refills | Status: DC
Start: 2020-06-23 — End: 2020-08-20

## 2020-06-23 MED ORDER — FLUOXETINE HCL 40 MG PO CAPS
40.0000 mg | ORAL_CAPSULE | Freq: Every day | ORAL | 2 refills | Status: DC
Start: 2020-06-23 — End: 2020-08-20

## 2020-06-23 MED ORDER — ARIPIPRAZOLE 5 MG PO TABS: 5.0000 mg | ORAL_TABLET | Freq: Every day | ORAL | 2 refills | Status: DC

## 2020-06-23 MED ORDER — DULOXETINE HCL 60 MG PO CPEP: 60.0000 mg | ORAL_CAPSULE | Freq: Every day | ORAL | 3 refills | Status: DC

## 2020-06-23 MED ORDER — TRAZODONE HCL 50 MG PO TABS
ORAL_TABLET | ORAL | 2 refills | Status: DC
Start: 2020-06-23 — End: 2020-08-20

## 2020-06-23 NOTE — Progress Notes (Signed)
Virtual Visit via Video Note  I connected with Mariah White on 06/23/20 at 11:20 AM EDT by a video enabled telemedicine application and verified that I am speaking with the correct person using two identifiers.  Location: Patient: home Provider: home   I discussed the limitations of evaluation and management by telemedicine and the availability of in person appointments. The patient expressed understanding and agreed to proceed.     I discussed the assessment and treatment plan with the patient. The patient was provided an opportunity to ask questions and all were answered. The patient agreed with the plan and demonstrated an understanding of the instructions.   The patient was advised to call back or seek an in-person evaluation if the symptoms worsen or if the condition fails to improve as anticipated.  I provided 15 minutes of non-face-to-face time during this encounter.   Diannia Ruder, MD  [facility] MD/PA/NP OP Progress Note  06/23/2020 11:45 AM Mariah White  MRN:  [id number]  Chief Complaint:  Chief Complaint    Depression; Anxiety; Follow-up     HPI: This patient is a 70 year old separated white female who lives with her son and daughter-in-law and 2 grandchildren in [place].  She is on disability for anxiety and back pain.  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  The patient returns for follow-up after long absence.  She was last seen about 10 months ago.  She is still having mobility issues from a stroke she suffered last year.  She is using a walker at home and a wheelchair when she goes out.  She feels "stuck in the house."  Her son and girlfriend both work and the children are in and out she states they do a good job taking care of her.  She no longer has much contact with friends.  She does feel more depressed but not suicidal.  She has been eating as well and has lost about 30 pounds in the last year.  She states her current weight is 142lbs.  Her energy is also low.  On the  positive side she is sleeping well and is hopeful that physical therapy will be helpful for her.  I suggested that we go up a bit on her Cymbalta.  She is also on Prozac and there is only so much medication we can give to someone her age.  The Xanax helps her good deal at night with anxiety and sleep Visit Diagnosis:    ICD-10-CM   1. Major depressive disorder, single episode, moderate (HCC)  F32.1     Past Psychiatric History: Long history of substance abuse, now in remission  Past Medical History:  Past Medical History:  Diagnosis Date  . Anxiety   . Arthritis   . CHF (congestive heart failure) (HCC)   . Chronic back pain   . Chronic pain   . COPD (chronic obstructive pulmonary disease) (HCC)   . Coronary artery disease   . Depression   . GERD (gastroesophageal reflux disease)   . Hypertension   . Migraine   . Stroke [facility])     Past Surgical History:  Procedure Laterality Date  . ABDOMINAL HYSTERECTOMY    . breast lump removed    . BREAST SURGERY    . CHOLECYSTECTOMY    . FOOT SURGERY      Family Psychiatric History: see below  Family History:  Family History  Problem Relation Age of Onset  . Bipolar disorder Daughter   . Diabetes  Mother   . Stroke Father   . Diabetes Sister   . Diabetes Brother   . Diabetes Brother   . Colon cancer Sister   . Colon cancer Sister   . Pancreatic cancer Brother   . Heart attack Brother     Social History:  Social History   Socioeconomic History  . Marital status: Single    Spouse name: Not on file  . Number of children: Not on file  . Years of education: Not on file  . Highest education level: Not on file  Occupational History  . Not on file  Tobacco Use  . Smoking status: Current Every Day Smoker    Packs/day: 1.00    Types: Cigarettes    Start date: 01/18/1959  . Smokeless tobacco: Never Used  . Tobacco comment: 1 pack per day   Vaping Use  . Vaping Use: Never used  Substance and Sexual Activity  . Alcohol use:  No  . Drug use: No  . Sexual activity: Not Currently  Other Topics Concern  . Not on file  Social History Narrative  . Not on file   Social Determinants of Health   Financial Resource Strain: Not on file  Food Insecurity: Not on file  Transportation Needs: Not on file  Physical Activity: Not on file  Stress: Not on file  Social Connections: Not on file    Allergies:  Allergies  Allergen Reactions  . Penicillins Itching    Has patient had a PCN reaction causing immediate rash, facial/tongue/throat swelling, SOB or lightheadedness with hypotension: Yes Has patient had a PCN reaction causing severe rash involving mucus membranes or skin necrosis: No Has patient had a PCN reaction that required hospitalization: No Has patient had a PCN reaction occurring within the last 10 years: No If all of the above answers are "NO", then may proceed with Cephalosporin use.     Metabolic Disorder Labs: No results found for: HGBA1C, MPG No results found for: PROLACTIN Lab Results  Component Value Date   TRIG 389 (H) 05/11/2017   No results found for: TSH  Therapeutic Level Labs: No results found for: LITHIUM No results found for: VALPROATE No components found for:  CBMZ  Current Medications: Current Outpatient Medications  Medication Sig Dispense Refill  . DULoxetine (CYMBALTA) 60 MG capsule Take 1 capsule (60 mg total) by mouth daily. 30 capsule 3  . albuterol (PROVENTIL HFA;VENTOLIN HFA) 108 (90 BASE) MCG/ACT inhaler Inhale 2 puffs into the lungs 2 (two) times daily.    Marland Kitchen ALPRAZolam (XANAX) 1 MG tablet Take 1 tablet (1 mg total) by mouth at bedtime. 30 tablet 2  . amLODipine (NORVASC) 5 MG tablet Take 1 tablet by mouth daily.    . ARIPiprazole (ABILIFY) 5 MG tablet Take 1 tablet (5 mg total) by mouth at bedtime. 90 tablet 2  . aspirin EC 81 MG tablet Take 81 mg by mouth daily.    . clopidogrel (PLAVIX) 75 MG tablet Take 75 mg by mouth daily.    . diclofenac sodium (VOLTAREN) 1 %  GEL Apply 1 application topically every 4 (four) hours as needed.    Marland Kitchen FLUoxetine (PROZAC) 40 MG capsule Take 1 capsule (40 mg total) by mouth daily. 30 capsule 2  . furosemide (LASIX) 40 MG tablet TAKE 1 TABLET BY MOUTH TWICE DAILY EMERGENCY REFILL FAXED DR. 60 tablet 6  . gabapentin (NEURONTIN) 600 MG tablet Take 600 mg 3 (three) times daily by mouth.  3  . ipratropium-albuterol (  DUONEB) 0.5-2.5 (3) MG/3ML SOLN Inhale 3 mLs into the lungs every 6 (six) hours as needed.    . magnesium oxide (MAG-OX) 400 (241.3 Mg) MG tablet TAKE 1 TABLET BY MOUTH TWICE DAILY 180 tablet 2  . meloxicam (MOBIC) 7.5 MG tablet Take 7.5 mg by mouth daily.    . methocarbamol (ROBAXIN) 500 MG tablet Take 500 mg by mouth 2 (two) times daily as needed.    Marland Kitchen oxyCODONE (OXY IR/ROXICODONE) 5 MG immediate release tablet Take 5 mg by mouth every 6 (six) hours as needed.    Marland Kitchen OZEMPIC, 0.25 OR 0.5 MG/DOSE, 2 MG/1.5ML SOPN Inject 1.5 mLs into the skin once a week.    . rosuvastatin (CRESTOR) 5 MG tablet Take 5 mg by mouth daily.    Marland Kitchen SPIRIVA HANDIHALER 18 MCG inhalation capsule Place 1 capsule into inhaler and inhale daily.    Marland Kitchen spironolactone (ALDACTONE) 25 MG tablet Take 25 mg by mouth daily.    . traZODone (DESYREL) 50 MG tablet TAKE 1 AND 1/2 TABLETS BY MOUTH AT BEDTIME 135 tablet 2   No current facility-administered medications for this visit.     Musculoskeletal: Strength & Muscle Tone: decreased Gait & Station: unsteady Patient leans: N/A  Psychiatric Specialty Exam: Review of Systems  Musculoskeletal: Positive for arthralgias and gait problem.  Neurological: Positive for weakness.  Psychiatric/Behavioral: Positive for dysphoric mood.  All other systems reviewed and are negative.   There were no vitals taken for this visit.There is no height or weight on file to calculate BMI.  General Appearance: NA  Eye Contact:  NA  Speech:  Clear and Coherent  Volume:  Normal  Mood:  Depressed  Affect:  NA  Thought  Process:  Goal Directed  Orientation:  Full (Time, Place, and Person)  Thought Content: Rumination   Suicidal Thoughts:  No  Homicidal Thoughts:  No  Memory:  Immediate;   Good Recent;   Good Remote;   Good  Judgement:  Fair  Insight:  Fair  Psychomotor Activity:  Decreased  Concentration:  Concentration: Fair and Attention Span: Fair  Recall:  Good  Fund of Knowledge: Fair  Language: Good  Akathisia:  No  Handed:  Right  AIMS (if indicated): not done  Assets:  Communication Skills Desire for Improvement Resilience Social Support  ADL's:  Intact  Cognition: WNL  Sleep:  Good   Screenings: PHQ2-9   Flowsheet Row [facility]23[facility]2020 [facility]  PHQ-2 Total Score 2  PHQ-9 Total Score 9    Flowsheet Row Video Visit from 06/23/2020 in [facility]  C-SSRS RISK CATEGORY No Risk       Assessment and Plan: This patient is a 70 year old female with a history of depression anxiety and a remote history of substance abuse.  She is still struggling to regain mobility from a stroke last year.  She has become more depressed and feeling more isolated.  I urged her to talk to family members about increased interaction.  She will increase Cymbalta to 60 mg daily along with Prozac 40 mg daily for depression, continue trazodone 75 mg at bedtime for sleep, Abilify 5 mg daily for augmentation and Xanax 1 mg at bedtime for anxiety.  She will return to see me in 2 months   Diannia Ruder, MD 06/23/2020, 11:45 AM

## 2020-06-30 DIAGNOSIS — I129 Hypertensive chronic kidney disease with stage 1 through stage 4 chronic kidney disease, or unspecified chronic kidney disease: Secondary | ICD-10-CM | POA: Diagnosis not present

## 2020-06-30 DIAGNOSIS — I5032 Chronic diastolic (congestive) heart failure: Secondary | ICD-10-CM | POA: Diagnosis not present

## 2020-06-30 DIAGNOSIS — E211 Secondary hyperparathyroidism, not elsewhere classified: Secondary | ICD-10-CM | POA: Diagnosis not present

## 2020-06-30 DIAGNOSIS — J449 Chronic obstructive pulmonary disease, unspecified: Secondary | ICD-10-CM | POA: Diagnosis not present

## 2020-06-30 DIAGNOSIS — I2510 Atherosclerotic heart disease of native coronary artery without angina pectoris: Secondary | ICD-10-CM | POA: Diagnosis not present

## 2020-06-30 DIAGNOSIS — R809 Proteinuria, unspecified: Secondary | ICD-10-CM | POA: Diagnosis not present

## 2020-06-30 DIAGNOSIS — E7800 Pure hypercholesterolemia, unspecified: Secondary | ICD-10-CM | POA: Diagnosis not present

## 2020-06-30 DIAGNOSIS — I509 Heart failure, unspecified: Secondary | ICD-10-CM | POA: Diagnosis not present

## 2020-07-13 DIAGNOSIS — M25561 Pain in right knee: Secondary | ICD-10-CM | POA: Diagnosis not present

## 2020-07-13 DIAGNOSIS — R29898 Other symptoms and signs involving the musculoskeletal system: Secondary | ICD-10-CM | POA: Diagnosis not present

## 2020-07-13 DIAGNOSIS — M25512 Pain in left shoulder: Secondary | ICD-10-CM | POA: Diagnosis not present

## 2020-07-13 DIAGNOSIS — M25532 Pain in left wrist: Secondary | ICD-10-CM | POA: Diagnosis not present

## 2020-07-13 DIAGNOSIS — R296 Repeated falls: Secondary | ICD-10-CM | POA: Diagnosis not present

## 2020-07-13 DIAGNOSIS — M25522 Pain in left elbow: Secondary | ICD-10-CM | POA: Diagnosis not present

## 2020-07-13 DIAGNOSIS — M25562 Pain in left knee: Secondary | ICD-10-CM | POA: Diagnosis not present

## 2020-07-15 ENCOUNTER — Ambulatory Visit: Payer: Medicare HMO | Admitting: Internal Medicine

## 2020-07-15 ENCOUNTER — Encounter: Payer: Self-pay | Admitting: Internal Medicine

## 2020-07-20 DIAGNOSIS — Z1231 Encounter for screening mammogram for malignant neoplasm of breast: Secondary | ICD-10-CM | POA: Diagnosis not present

## 2020-07-21 DIAGNOSIS — J449 Chronic obstructive pulmonary disease, unspecified: Secondary | ICD-10-CM | POA: Diagnosis not present

## 2020-07-22 DIAGNOSIS — M130 Polyarthritis, unspecified: Secondary | ICD-10-CM | POA: Diagnosis not present

## 2020-07-22 DIAGNOSIS — M545 Low back pain, unspecified: Secondary | ICD-10-CM | POA: Diagnosis not present

## 2020-07-22 DIAGNOSIS — Z79891 Long term (current) use of opiate analgesic: Secondary | ICD-10-CM | POA: Diagnosis not present

## 2020-07-22 DIAGNOSIS — M25569 Pain in unspecified knee: Secondary | ICD-10-CM | POA: Diagnosis not present

## 2020-07-22 DIAGNOSIS — G4700 Insomnia, unspecified: Secondary | ICD-10-CM | POA: Diagnosis not present

## 2020-07-23 DIAGNOSIS — M25532 Pain in left wrist: Secondary | ICD-10-CM | POA: Diagnosis not present

## 2020-07-23 DIAGNOSIS — M25562 Pain in left knee: Secondary | ICD-10-CM | POA: Diagnosis not present

## 2020-07-23 DIAGNOSIS — M25512 Pain in left shoulder: Secondary | ICD-10-CM | POA: Diagnosis not present

## 2020-07-23 DIAGNOSIS — M25522 Pain in left elbow: Secondary | ICD-10-CM | POA: Diagnosis not present

## 2020-07-23 DIAGNOSIS — M25561 Pain in right knee: Secondary | ICD-10-CM | POA: Diagnosis not present

## 2020-07-23 DIAGNOSIS — R29898 Other symptoms and signs involving the musculoskeletal system: Secondary | ICD-10-CM | POA: Diagnosis not present

## 2020-07-23 DIAGNOSIS — R296 Repeated falls: Secondary | ICD-10-CM | POA: Diagnosis not present

## 2020-07-31 DIAGNOSIS — I509 Heart failure, unspecified: Secondary | ICD-10-CM | POA: Diagnosis not present

## 2020-07-31 DIAGNOSIS — E7800 Pure hypercholesterolemia, unspecified: Secondary | ICD-10-CM | POA: Diagnosis not present

## 2020-07-31 DIAGNOSIS — I2510 Atherosclerotic heart disease of native coronary artery without angina pectoris: Secondary | ICD-10-CM | POA: Diagnosis not present

## 2020-07-31 DIAGNOSIS — J449 Chronic obstructive pulmonary disease, unspecified: Secondary | ICD-10-CM | POA: Diagnosis not present

## 2020-08-04 ENCOUNTER — Telehealth (HOSPITAL_COMMUNITY): Payer: Self-pay | Admitting: Psychiatry

## 2020-08-04 NOTE — Telephone Encounter (Signed)
Called to schedule f/u appt, left vm 

## 2020-08-06 DIAGNOSIS — G894 Chronic pain syndrome: Secondary | ICD-10-CM | POA: Diagnosis not present

## 2020-08-06 DIAGNOSIS — E1122 Type 2 diabetes mellitus with diabetic chronic kidney disease: Secondary | ICD-10-CM | POA: Diagnosis not present

## 2020-08-06 DIAGNOSIS — I214 Non-ST elevation (NSTEMI) myocardial infarction: Secondary | ICD-10-CM | POA: Diagnosis not present

## 2020-08-06 DIAGNOSIS — R0902 Hypoxemia: Secondary | ICD-10-CM | POA: Diagnosis not present

## 2020-08-06 DIAGNOSIS — M25552 Pain in left hip: Secondary | ICD-10-CM | POA: Diagnosis not present

## 2020-08-06 DIAGNOSIS — G9341 Metabolic encephalopathy: Secondary | ICD-10-CM | POA: Diagnosis not present

## 2020-08-06 DIAGNOSIS — Z7902 Long term (current) use of antithrombotics/antiplatelets: Secondary | ICD-10-CM | POA: Diagnosis not present

## 2020-08-06 DIAGNOSIS — N179 Acute kidney failure, unspecified: Secondary | ICD-10-CM | POA: Diagnosis not present

## 2020-08-06 DIAGNOSIS — I6523 Occlusion and stenosis of bilateral carotid arteries: Secondary | ICD-10-CM | POA: Diagnosis not present

## 2020-08-06 DIAGNOSIS — J9811 Atelectasis: Secondary | ICD-10-CM | POA: Diagnosis not present

## 2020-08-06 DIAGNOSIS — R062 Wheezing: Secondary | ICD-10-CM | POA: Diagnosis not present

## 2020-08-06 DIAGNOSIS — Z880 Allergy status to penicillin: Secondary | ICD-10-CM | POA: Diagnosis not present

## 2020-08-06 DIAGNOSIS — W19XXXA Unspecified fall, initial encounter: Secondary | ICD-10-CM | POA: Diagnosis not present

## 2020-08-06 DIAGNOSIS — I959 Hypotension, unspecified: Secondary | ICD-10-CM | POA: Diagnosis not present

## 2020-08-06 DIAGNOSIS — I5032 Chronic diastolic (congestive) heart failure: Secondary | ICD-10-CM | POA: Diagnosis not present

## 2020-08-06 DIAGNOSIS — Z7982 Long term (current) use of aspirin: Secondary | ICD-10-CM | POA: Diagnosis not present

## 2020-08-06 DIAGNOSIS — W1839XA Other fall on same level, initial encounter: Secondary | ICD-10-CM | POA: Diagnosis not present

## 2020-08-06 DIAGNOSIS — A419 Sepsis, unspecified organism: Secondary | ICD-10-CM | POA: Diagnosis not present

## 2020-08-06 DIAGNOSIS — N183 Chronic kidney disease, stage 3 unspecified: Secondary | ICD-10-CM | POA: Diagnosis not present

## 2020-08-06 DIAGNOSIS — I252 Old myocardial infarction: Secondary | ICD-10-CM | POA: Diagnosis not present

## 2020-08-06 DIAGNOSIS — Z8673 Personal history of transient ischemic attack (TIA), and cerebral infarction without residual deficits: Secondary | ICD-10-CM | POA: Diagnosis not present

## 2020-08-06 DIAGNOSIS — I708 Atherosclerosis of other arteries: Secondary | ICD-10-CM | POA: Diagnosis not present

## 2020-08-06 DIAGNOSIS — G319 Degenerative disease of nervous system, unspecified: Secondary | ICD-10-CM | POA: Diagnosis not present

## 2020-08-06 DIAGNOSIS — R778 Other specified abnormalities of plasma proteins: Secondary | ICD-10-CM | POA: Diagnosis not present

## 2020-08-06 DIAGNOSIS — Z79899 Other long term (current) drug therapy: Secondary | ICD-10-CM | POA: Diagnosis not present

## 2020-08-06 DIAGNOSIS — M4182 Other forms of scoliosis, cervical region: Secondary | ICD-10-CM | POA: Diagnosis not present

## 2020-08-06 DIAGNOSIS — E11649 Type 2 diabetes mellitus with hypoglycemia without coma: Secondary | ICD-10-CM | POA: Diagnosis not present

## 2020-08-06 DIAGNOSIS — R404 Transient alteration of awareness: Secondary | ICD-10-CM | POA: Diagnosis not present

## 2020-08-06 DIAGNOSIS — Z20822 Contact with and (suspected) exposure to covid-19: Secondary | ICD-10-CM | POA: Diagnosis not present

## 2020-08-06 DIAGNOSIS — J441 Chronic obstructive pulmonary disease with (acute) exacerbation: Secondary | ICD-10-CM | POA: Diagnosis not present

## 2020-08-06 DIAGNOSIS — F17210 Nicotine dependence, cigarettes, uncomplicated: Secondary | ICD-10-CM | POA: Diagnosis not present

## 2020-08-06 DIAGNOSIS — Z743 Need for continuous supervision: Secondary | ICD-10-CM | POA: Diagnosis not present

## 2020-08-06 DIAGNOSIS — R296 Repeated falls: Secondary | ICD-10-CM | POA: Diagnosis not present

## 2020-08-06 DIAGNOSIS — I739 Peripheral vascular disease, unspecified: Secondary | ICD-10-CM | POA: Diagnosis not present

## 2020-08-06 DIAGNOSIS — Z792 Long term (current) use of antibiotics: Secondary | ICD-10-CM | POA: Diagnosis not present

## 2020-08-06 DIAGNOSIS — N3000 Acute cystitis without hematuria: Secondary | ICD-10-CM | POA: Diagnosis not present

## 2020-08-06 DIAGNOSIS — R079 Chest pain, unspecified: Secondary | ICD-10-CM | POA: Diagnosis not present

## 2020-08-06 DIAGNOSIS — R410 Disorientation, unspecified: Secondary | ICD-10-CM | POA: Diagnosis not present

## 2020-08-07 DIAGNOSIS — G9341 Metabolic encephalopathy: Secondary | ICD-10-CM | POA: Diagnosis not present

## 2020-08-07 DIAGNOSIS — N183 Chronic kidney disease, stage 3 unspecified: Secondary | ICD-10-CM | POA: Diagnosis not present

## 2020-08-07 DIAGNOSIS — G894 Chronic pain syndrome: Secondary | ICD-10-CM | POA: Diagnosis not present

## 2020-08-07 DIAGNOSIS — Z792 Long term (current) use of antibiotics: Secondary | ICD-10-CM | POA: Diagnosis not present

## 2020-08-07 DIAGNOSIS — E11649 Type 2 diabetes mellitus with hypoglycemia without coma: Secondary | ICD-10-CM | POA: Diagnosis not present

## 2020-08-07 DIAGNOSIS — J441 Chronic obstructive pulmonary disease with (acute) exacerbation: Secondary | ICD-10-CM | POA: Diagnosis not present

## 2020-08-07 DIAGNOSIS — I5032 Chronic diastolic (congestive) heart failure: Secondary | ICD-10-CM | POA: Diagnosis not present

## 2020-08-07 DIAGNOSIS — R296 Repeated falls: Secondary | ICD-10-CM | POA: Diagnosis not present

## 2020-08-07 DIAGNOSIS — E1122 Type 2 diabetes mellitus with diabetic chronic kidney disease: Secondary | ICD-10-CM | POA: Diagnosis not present

## 2020-08-12 ENCOUNTER — Telehealth: Payer: Self-pay | Admitting: Internal Medicine

## 2020-08-12 ENCOUNTER — Inpatient Hospital Stay (HOSPITAL_COMMUNITY)
Admission: AD | Admit: 2020-08-12 | Discharge: 2020-08-20 | DRG: 286 | Disposition: A | Discharge status: Skilled Nursing Facility | Payer: Medicare Other | Source: Other Acute Inpatient Hospital | Attending: Internal Medicine | Admitting: Internal Medicine

## 2020-08-12 ENCOUNTER — Encounter (HOSPITAL_COMMUNITY): Payer: Self-pay | Admitting: Internal Medicine

## 2020-08-12 ENCOUNTER — Other Ambulatory Visit: Payer: Self-pay

## 2020-08-12 ENCOUNTER — Telehealth: Payer: Self-pay | Admitting: Cardiology

## 2020-08-12 DIAGNOSIS — N179 Acute kidney failure, unspecified: Secondary | ICD-10-CM | POA: Diagnosis present

## 2020-08-12 DIAGNOSIS — R079 Chest pain, unspecified: Secondary | ICD-10-CM | POA: Diagnosis present

## 2020-08-12 DIAGNOSIS — R0902 Hypoxemia: Secondary | ICD-10-CM | POA: Diagnosis not present

## 2020-08-12 DIAGNOSIS — T502X5A Adverse effect of carbonic-anhydrase inhibitors, benzothiadiazides and other diuretics, initial encounter: Secondary | ICD-10-CM | POA: Diagnosis not present

## 2020-08-12 DIAGNOSIS — Z880 Allergy status to penicillin: Secondary | ICD-10-CM

## 2020-08-12 DIAGNOSIS — Z20822 Contact with and (suspected) exposure to covid-19: Secondary | ICD-10-CM | POA: Diagnosis not present

## 2020-08-12 DIAGNOSIS — I25118 Atherosclerotic heart disease of native coronary artery with other forms of angina pectoris: Secondary | ICD-10-CM | POA: Diagnosis not present

## 2020-08-12 DIAGNOSIS — F32A Depression, unspecified: Secondary | ICD-10-CM | POA: Diagnosis present

## 2020-08-12 DIAGNOSIS — I6389 Other cerebral infarction: Secondary | ICD-10-CM | POA: Diagnosis not present

## 2020-08-12 DIAGNOSIS — M6281 Muscle weakness (generalized): Secondary | ICD-10-CM | POA: Diagnosis not present

## 2020-08-12 DIAGNOSIS — Z8673 Personal history of transient ischemic attack (TIA), and cerebral infarction without residual deficits: Secondary | ICD-10-CM

## 2020-08-12 DIAGNOSIS — G4733 Obstructive sleep apnea (adult) (pediatric): Secondary | ICD-10-CM | POA: Diagnosis not present

## 2020-08-12 DIAGNOSIS — J441 Chronic obstructive pulmonary disease with (acute) exacerbation: Secondary | ICD-10-CM | POA: Diagnosis not present

## 2020-08-12 DIAGNOSIS — Z8249 Family history of ischemic heart disease and other diseases of the circulatory system: Secondary | ICD-10-CM

## 2020-08-12 DIAGNOSIS — F17210 Nicotine dependence, cigarettes, uncomplicated: Secondary | ICD-10-CM | POA: Diagnosis present

## 2020-08-12 DIAGNOSIS — Z7982 Long term (current) use of aspirin: Secondary | ICD-10-CM

## 2020-08-12 DIAGNOSIS — I5021 Acute systolic (congestive) heart failure: Secondary | ICD-10-CM | POA: Diagnosis not present

## 2020-08-12 DIAGNOSIS — I2510 Atherosclerotic heart disease of native coronary artery without angina pectoris: Secondary | ICD-10-CM | POA: Diagnosis present

## 2020-08-12 DIAGNOSIS — L89152 Pressure ulcer of sacral region, stage 2: Secondary | ICD-10-CM | POA: Diagnosis present

## 2020-08-12 DIAGNOSIS — I248 Other forms of acute ischemic heart disease: Secondary | ICD-10-CM | POA: Diagnosis not present

## 2020-08-12 DIAGNOSIS — J449 Chronic obstructive pulmonary disease, unspecified: Secondary | ICD-10-CM | POA: Diagnosis not present

## 2020-08-12 DIAGNOSIS — F419 Anxiety disorder, unspecified: Secondary | ICD-10-CM | POA: Diagnosis present

## 2020-08-12 DIAGNOSIS — K219 Gastro-esophageal reflux disease without esophagitis: Secondary | ICD-10-CM | POA: Diagnosis not present

## 2020-08-12 DIAGNOSIS — J9611 Chronic respiratory failure with hypoxia: Secondary | ICD-10-CM | POA: Diagnosis present

## 2020-08-12 DIAGNOSIS — R778 Other specified abnormalities of plasma proteins: Secondary | ICD-10-CM | POA: Diagnosis present

## 2020-08-12 DIAGNOSIS — I959 Hypotension, unspecified: Secondary | ICD-10-CM | POA: Diagnosis present

## 2020-08-12 DIAGNOSIS — N390 Urinary tract infection, site not specified: Secondary | ICD-10-CM | POA: Diagnosis not present

## 2020-08-12 DIAGNOSIS — I25110 Atherosclerotic heart disease of native coronary artery with unstable angina pectoris: Secondary | ICD-10-CM | POA: Diagnosis not present

## 2020-08-12 DIAGNOSIS — I214 Non-ST elevation (NSTEMI) myocardial infarction: Secondary | ICD-10-CM | POA: Diagnosis not present

## 2020-08-12 DIAGNOSIS — N3000 Acute cystitis without hematuria: Secondary | ICD-10-CM | POA: Diagnosis not present

## 2020-08-12 DIAGNOSIS — E1122 Type 2 diabetes mellitus with diabetic chronic kidney disease: Secondary | ICD-10-CM | POA: Diagnosis not present

## 2020-08-12 DIAGNOSIS — R296 Repeated falls: Secondary | ICD-10-CM | POA: Diagnosis present

## 2020-08-12 DIAGNOSIS — I517 Cardiomegaly: Secondary | ICD-10-CM | POA: Diagnosis not present

## 2020-08-12 DIAGNOSIS — R7989 Other specified abnormal findings of blood chemistry: Secondary | ICD-10-CM | POA: Diagnosis present

## 2020-08-12 DIAGNOSIS — E871 Hypo-osmolality and hyponatremia: Secondary | ICD-10-CM | POA: Diagnosis present

## 2020-08-12 DIAGNOSIS — I10 Essential (primary) hypertension: Secondary | ICD-10-CM | POA: Diagnosis not present

## 2020-08-12 DIAGNOSIS — R531 Weakness: Secondary | ICD-10-CM | POA: Diagnosis present

## 2020-08-12 DIAGNOSIS — Z823 Family history of stroke: Secondary | ICD-10-CM

## 2020-08-12 DIAGNOSIS — N183 Chronic kidney disease, stage 3 unspecified: Secondary | ICD-10-CM | POA: Diagnosis not present

## 2020-08-12 DIAGNOSIS — R112 Nausea with vomiting, unspecified: Secondary | ICD-10-CM | POA: Diagnosis present

## 2020-08-12 DIAGNOSIS — Z800 Family history of malignant neoplasm of digestive organs: Secondary | ICD-10-CM

## 2020-08-12 DIAGNOSIS — G43909 Migraine, unspecified, not intractable, without status migrainosus: Secondary | ICD-10-CM | POA: Diagnosis not present

## 2020-08-12 DIAGNOSIS — N1832 Chronic kidney disease, stage 3b: Secondary | ICD-10-CM | POA: Diagnosis present

## 2020-08-12 DIAGNOSIS — M1990 Unspecified osteoarthritis, unspecified site: Secondary | ICD-10-CM | POA: Diagnosis present

## 2020-08-12 DIAGNOSIS — Z9981 Dependence on supplemental oxygen: Secondary | ICD-10-CM | POA: Diagnosis not present

## 2020-08-12 DIAGNOSIS — Z751 Person awaiting admission to adequate facility elsewhere: Secondary | ICD-10-CM

## 2020-08-12 DIAGNOSIS — I69391 Dysphagia following cerebral infarction: Secondary | ICD-10-CM | POA: Diagnosis not present

## 2020-08-12 DIAGNOSIS — E1140 Type 2 diabetes mellitus with diabetic neuropathy, unspecified: Secondary | ICD-10-CM | POA: Diagnosis not present

## 2020-08-12 DIAGNOSIS — Z791 Long term (current) use of non-steroidal anti-inflammatories (NSAID): Secondary | ICD-10-CM

## 2020-08-12 DIAGNOSIS — I5043 Acute on chronic combined systolic (congestive) and diastolic (congestive) heart failure: Secondary | ICD-10-CM | POA: Diagnosis not present

## 2020-08-12 DIAGNOSIS — I428 Other cardiomyopathies: Secondary | ICD-10-CM | POA: Diagnosis not present

## 2020-08-12 DIAGNOSIS — I130 Hypertensive heart and chronic kidney disease with heart failure and stage 1 through stage 4 chronic kidney disease, or unspecified chronic kidney disease: Secondary | ICD-10-CM | POA: Diagnosis not present

## 2020-08-12 DIAGNOSIS — E785 Hyperlipidemia, unspecified: Secondary | ICD-10-CM | POA: Diagnosis not present

## 2020-08-12 DIAGNOSIS — R42 Dizziness and giddiness: Secondary | ICD-10-CM | POA: Diagnosis not present

## 2020-08-12 DIAGNOSIS — E876 Hypokalemia: Secondary | ICD-10-CM | POA: Diagnosis not present

## 2020-08-12 DIAGNOSIS — Z7401 Bed confinement status: Secondary | ICD-10-CM | POA: Diagnosis not present

## 2020-08-12 DIAGNOSIS — Z90710 Acquired absence of both cervix and uterus: Secondary | ICD-10-CM

## 2020-08-12 DIAGNOSIS — A419 Sepsis, unspecified organism: Secondary | ICD-10-CM | POA: Diagnosis not present

## 2020-08-12 DIAGNOSIS — R443 Hallucinations, unspecified: Secondary | ICD-10-CM | POA: Diagnosis not present

## 2020-08-12 DIAGNOSIS — L8990 Pressure ulcer of unspecified site, unspecified stage: Secondary | ICD-10-CM | POA: Diagnosis present

## 2020-08-12 DIAGNOSIS — D72829 Elevated white blood cell count, unspecified: Secondary | ICD-10-CM

## 2020-08-12 DIAGNOSIS — Z7902 Long term (current) use of antithrombotics/antiplatelets: Secondary | ICD-10-CM

## 2020-08-12 DIAGNOSIS — Z833 Family history of diabetes mellitus: Secondary | ICD-10-CM

## 2020-08-12 DIAGNOSIS — G894 Chronic pain syndrome: Secondary | ICD-10-CM | POA: Diagnosis not present

## 2020-08-12 DIAGNOSIS — M1380 Other specified arthritis, unspecified site: Secondary | ICD-10-CM | POA: Diagnosis not present

## 2020-08-12 DIAGNOSIS — R9431 Abnormal electrocardiogram [ECG] [EKG]: Secondary | ICD-10-CM | POA: Diagnosis not present

## 2020-08-12 DIAGNOSIS — Z79899 Other long term (current) drug therapy: Secondary | ICD-10-CM

## 2020-08-12 DIAGNOSIS — Z7984 Long term (current) use of oral hypoglycemic drugs: Secondary | ICD-10-CM

## 2020-08-12 DIAGNOSIS — W19XXXA Unspecified fall, initial encounter: Secondary | ICD-10-CM | POA: Diagnosis not present

## 2020-08-12 DIAGNOSIS — Z720 Tobacco use: Secondary | ICD-10-CM | POA: Diagnosis not present

## 2020-08-12 DIAGNOSIS — Z743 Need for continuous supervision: Secondary | ICD-10-CM | POA: Diagnosis not present

## 2020-08-12 DIAGNOSIS — G9341 Metabolic encephalopathy: Secondary | ICD-10-CM | POA: Diagnosis present

## 2020-08-12 DIAGNOSIS — G8929 Other chronic pain: Secondary | ICD-10-CM | POA: Diagnosis present

## 2020-08-12 DIAGNOSIS — Z9181 History of falling: Secondary | ICD-10-CM | POA: Diagnosis not present

## 2020-08-12 DIAGNOSIS — I5030 Unspecified diastolic (congestive) heart failure: Secondary | ICD-10-CM | POA: Diagnosis not present

## 2020-08-12 DIAGNOSIS — M549 Dorsalgia, unspecified: Secondary | ICD-10-CM | POA: Diagnosis present

## 2020-08-12 LAB — CBC
HCT: 42.6 % (ref 36.0–46.0)
Hemoglobin: 14.3 g/dL (ref 12.0–15.0)
MCH: 28.7 pg (ref 26.0–34.0)
MCHC: 33.6 g/dL (ref 30.0–36.0)
MCV: 85.5 fL (ref 80.0–100.0)
Platelets: 297 10*3/uL (ref 150–400)
RBC: 4.98 MIL/uL (ref 3.87–5.11)
RDW: 14.4 % (ref 11.5–15.5)
WBC: 11.1 10*3/uL — ABNORMAL HIGH (ref 4.0–10.5)
nRBC: 0 % (ref 0.0–0.2)

## 2020-08-12 LAB — COMPREHENSIVE METABOLIC PANEL
ALT: 24 U/L (ref 0–44)
AST: 31 U/L (ref 15–41)
Albumin: 2.8 g/dL — ABNORMAL LOW (ref 3.5–5.0)
Alkaline Phosphatase: 93 U/L (ref 38–126)
Anion gap: 11 (ref 5–15)
BUN: 46 mg/dL — ABNORMAL HIGH (ref 8–23)
CO2: 25 mmol/L (ref 22–32)
Calcium: 8.4 mg/dL — ABNORMAL LOW (ref 8.9–10.3)
Chloride: 88 mmol/L — ABNORMAL LOW (ref 98–111)
Creatinine, Ser: 1.48 mg/dL — ABNORMAL HIGH (ref 0.44–1.00)
GFR, Estimated: 38 mL/min — ABNORMAL LOW (ref >60)
Glucose, Bld: 150 mg/dL — ABNORMAL HIGH (ref 70–99)
Potassium: 3.9 mmol/L (ref 3.5–5.1)
Sodium: 124 mmol/L — ABNORMAL LOW (ref 135–145)
Total Bilirubin: 0.9 mg/dL (ref 0.3–1.2)
Total Protein: 7.1 g/dL (ref 6.5–8.1)

## 2020-08-12 LAB — TROPONIN I (HIGH SENSITIVITY)
Troponin I (High Sensitivity): 197 ng/L (ref <18)
Troponin I (High Sensitivity): 164 ng/L (ref <18)

## 2020-08-12 LAB — HEMOGLOBIN A1C
Hgb A1c MFr Bld: 6 % — ABNORMAL HIGH (ref 4.8–5.6)
Mean Plasma Glucose: 125.5 mg/dL

## 2020-08-12 LAB — HIV ANTIBODY (ROUTINE TESTING W REFLEX): HIV Screen 4th Generation wRfx: NONREACTIVE

## 2020-08-12 MED ORDER — OXYCODONE HCL 5 MG PO TABS
5.0000 mg | ORAL_TABLET | Freq: Four times a day (QID) | ORAL | Status: DC | PRN
  Administered 2020-08-14 – 2020-08-20 (×8): 5 mg via ORAL
  Filled 2020-08-12 (×8): qty 1

## 2020-08-12 MED ORDER — DULOXETINE HCL 60 MG PO CPEP
60.0000 mg | ORAL_CAPSULE | Freq: Every day | ORAL | Status: DC
  Administered 2020-08-13 – 2020-08-20 (×8): 60 mg via ORAL
  Filled 2020-08-12 (×8): qty 1

## 2020-08-12 MED ORDER — ARIPIPRAZOLE 5 MG PO TABS
5.0000 mg | ORAL_TABLET | Freq: Every day | ORAL | Status: DC
  Administered 2020-08-13 – 2020-08-19 (×8): 5 mg via ORAL
  Filled 2020-08-12 (×8): qty 1

## 2020-08-12 MED ORDER — HEPARIN (PORCINE) 25000 UT/250ML-% IV SOLN: 900.0000 [IU]/h | INTRAVENOUS | Status: DC

## 2020-08-12 MED ORDER — CLOPIDOGREL BISULFATE 75 MG PO TABS
75.0000 mg | ORAL_TABLET | Freq: Every day | ORAL | Status: DC
  Administered 2020-08-13 – 2020-08-20 (×8): 75 mg via ORAL
  Filled 2020-08-12 (×8): qty 1

## 2020-08-12 MED ORDER — INSULIN ASPART 100 UNIT/ML IJ SOLN
0.0000 [IU] | Freq: Three times a day (TID) | INTRAMUSCULAR | Status: DC
  Administered 2020-08-13 – 2020-08-18 (×3): 1 [IU] via SUBCUTANEOUS
  Administered 2020-08-19: 2 [IU] via SUBCUTANEOUS

## 2020-08-12 MED ORDER — ONDANSETRON HCL 4 MG/2ML IJ SOLN
4.0000 mg | Freq: Four times a day (QID) | INTRAMUSCULAR | Status: DC | PRN
  Administered 2020-08-12: 4 mg via INTRAVENOUS
  Filled 2020-08-12: qty 2

## 2020-08-12 MED ORDER — NICOTINE 14 MG/24HR TD PT24
14.0000 mg | MEDICATED_PATCH | Freq: Every day | TRANSDERMAL | Status: DC
  Administered 2020-08-12 – 2020-08-20 (×7): 14 mg via TRANSDERMAL
  Filled 2020-08-12 (×9): qty 1

## 2020-08-12 MED ORDER — TIOTROPIUM BROMIDE MONOHYDRATE 18 MCG IN CAPS: 1.0000 | ORAL_CAPSULE | Freq: Every day | RESPIRATORY_TRACT | Status: DC

## 2020-08-12 MED ORDER — IPRATROPIUM-ALBUTEROL 0.5-2.5 (3) MG/3ML IN SOLN
3.0000 mL | Freq: Four times a day (QID) | RESPIRATORY_TRACT | Status: DC | PRN
  Administered 2020-08-16: 3 mL via RESPIRATORY_TRACT
  Filled 2020-08-12: qty 3

## 2020-08-12 MED ORDER — ASPIRIN EC 81 MG PO TBEC
81.0000 mg | DELAYED_RELEASE_TABLET | Freq: Every day | ORAL | Status: DC
  Administered 2020-08-13 – 2020-08-20 (×7): 81 mg via ORAL
  Filled 2020-08-12 (×7): qty 1

## 2020-08-12 MED ORDER — FLUOXETINE HCL 20 MG PO CAPS
40.0000 mg | ORAL_CAPSULE | Freq: Every day | ORAL | Status: DC
  Administered 2020-08-13 – 2020-08-20 (×8): 40 mg via ORAL
  Filled 2020-08-12 (×8): qty 2

## 2020-08-12 MED ORDER — ACETAMINOPHEN 325 MG PO TABS
650.0000 mg | ORAL_TABLET | ORAL | Status: DC | PRN
  Administered 2020-08-20: 650 mg via ORAL
  Filled 2020-08-12: qty 2

## 2020-08-12 MED ORDER — ROSUVASTATIN CALCIUM 5 MG PO TABS
5.0000 mg | ORAL_TABLET | Freq: Every day | ORAL | Status: DC
  Administered 2020-08-13: 5 mg via ORAL
  Filled 2020-08-12: qty 1

## 2020-08-12 MED ORDER — FUROSEMIDE 10 MG/ML IJ SOLN
40.0000 mg | Freq: Two times a day (BID) | INTRAMUSCULAR | Status: DC
  Administered 2020-08-12 – 2020-08-13 (×3): 40 mg via INTRAVENOUS
  Filled 2020-08-12 (×3): qty 4

## 2020-08-12 MED ORDER — ALBUTEROL SULFATE (2.5 MG/3ML) 0.083% IN NEBU: 2.5000 mg | INHALATION_SOLUTION | Freq: Four times a day (QID) | RESPIRATORY_TRACT | Status: DC | PRN

## 2020-08-12 NOTE — Telephone Encounter (Signed)
Received a phone call from [facility] Requesting: Mariah White Reason for transfer: ACS History: Patient with h/o Dm; COPD; chronic pain; stage 3 CKD; and chronic diastolic CHF presenting with AMS on 5/6.  Hypotensive but stable.  Last night, she complained of CP.  EKG with Q waves, troponin increased.  Cardiology consulted but sees Dr. Wyline Mood who agrees to consult for cath.  On Heparin now.  Stable otherwise.  Chest pain has resolved.  EF is decreased from prior. Plan of care:  Transfer for cath, started on Heparin drip. The patient will be accepted for admission to telemetry, will call cardiology upon arrival.   Georgana Curio, M.D. [facility]

## 2020-08-12 NOTE — H&P (Signed)
History and Physical    Mariah White:[id number] DOB: 1950-11-26 DOA: 08/12/2020  Referring MD/NP/PA: Jonah Blue, MD PCP: [facility]  Patient coming from: Transfer from [facility]  Chief Complaint: Chest pain  I have personally briefly reviewed patient's old medical records in [facility] [facility]   HPI: Mariah White is a 70 y.o. female with medical history significant of hypertension, diastolic CHF, CAD, prior CVA, and COPD on 2 L of oxygen 24/7 presents as a transfer with complaints of chest pain.  History is obtained from talks with the patient as well as review of records.  Patient had hospitalized at [facility] from 5/6-5/12, after initially presenting after having a fall at home with complaints of left hip pain..  She reports that at that time she was reaching for cigarettes while at bed and fell out hitting her head but denied any loss of consciousness.  CT scan of the head without contrast did not note any acute abnormalities and x-rays of her pelvis showed no acute fractures.  While in the emergency department patient was reported to complain of some dysuria and was temporarily noted to be hypotensive.  Hypotension resolved with IV fluids and she was treated for the urinary tract infection with Rocephin.  During her hospital stay patient had been noted to be intermittently confused at times.  Initially the plan was to transfer transfer the patient to a rehab facility due to ongoing weakness.  Patient reports chest pain may have started 2 to 3 days ago and is located in the middle of her chest and feels like someone sitting on her.  Reported associated symptoms of shortness of breath with exertion, nausea, vomiting, and diaphoresis with chest pain.  Chest pain was reportedly brought to the provider's attention yesterday and EKG was found to have concern for Q waves.  High-sensitivity troponins were noted to be elevated 325->369.  Echocardiogram was obtained  which showed EF of 30 to 35%.  Patient has been started on heparin IV as well as evaluated by cardiology who recommended cardiac cath.  Case was discussed with Dr. Wyline Mood of cardiology here at [facility] as well as Dr. Ophelia Charter of the [facility].  Patient accepted to a cardiac telemetry bed as inpatient.   ED Course: As seen above.  Review of Systems  Constitutional: Positive for diaphoresis. Negative for fever.  HENT: Negative for ear discharge and nosebleeds.   Eyes: Negative for photophobia and pain.  Respiratory: Positive for shortness of breath.   Cardiovascular: Positive for chest pain. Negative for leg swelling.  Gastrointestinal: Positive for nausea and vomiting. Negative for abdominal pain.  Genitourinary: Negative for hematuria and urgency.  Musculoskeletal: Positive for falls.  Neurological: Negative for loss of consciousness.  Psychiatric/Behavioral: Positive for substance abuse.       Positive for intermittent confusion    Past Medical History:  Diagnosis Date  . Anxiety   . Arthritis   . CHF (congestive heart failure) (HCC)   . Chronic back pain   . Chronic pain   . COPD (chronic obstructive pulmonary disease) (HCC)   . Coronary artery disease   . Depression   . GERD (gastroesophageal reflux disease)   . Hypertension   . Migraine   . Stroke [facility])     Past Surgical History:  Procedure Laterality Date  . ABDOMINAL HYSTERECTOMY    . breast lump removed    . BREAST SURGERY    . CHOLECYSTECTOMY    . FOOT  SURGERY       reports that she has been smoking cigarettes. She started smoking about 61 years ago. She has been smoking about 1.00 pack per day. She has never used smokeless tobacco. She reports that she does not drink alcohol and does not use drugs.  Allergies  Allergen Reactions  . Penicillins Itching    Has patient had a PCN reaction causing immediate rash, facial/tongue/throat swelling, SOB or lightheadedness with hypotension: Yes Has patient  had a PCN reaction causing severe rash involving mucus membranes or skin necrosis: No Has patient had a PCN reaction that required hospitalization: No Has patient had a PCN reaction occurring within the last 10 years: No If all of the above answers are "NO", then may proceed with Cephalosporin use.     Family History  Problem Relation Age of Onset  . Bipolar disorder Daughter   . Diabetes Mother   . Stroke Father   . Diabetes Sister   . Diabetes Brother   . Diabetes Brother   . Colon cancer Sister   . Colon cancer Sister   . Pancreatic cancer Brother   . Heart attack Brother     Prior to Admission medications   Medication Sig Start Date End Date Taking? Authorizing Provider  albuterol (PROVENTIL HFA;VENTOLIN HFA) 108 (90 BASE) MCG/ACT inhaler Inhale 2 puffs into the lungs 2 (two) times daily.    [provider]  ALPRAZolam Prudy Feeler) 1 MG tablet Take 1 tablet (1 mg total) by mouth at bedtime. 06/23/20   Myrlene Broker, MD  amLODipine (NORVASC) 5 MG tablet Take 1 tablet by mouth daily. 09/18/18   [provider]  ARIPiprazole (ABILIFY) 5 MG tablet Take 1 tablet (5 mg total) by mouth at bedtime. 06/23/20 06/23/21  Myrlene Broker, MD  aspirin EC 81 MG tablet Take 81 mg by mouth daily.    [provider]  clopidogrel (PLAVIX) 75 MG tablet Take 75 mg by mouth daily.    [provider]  diclofenac sodium (VOLTAREN) 1 % GEL Apply 1 application topically every 4 (four) hours as needed. 09/18/18   [provider]  DULoxetine (CYMBALTA) 60 MG capsule Take 1 capsule (60 mg total) by mouth daily. 06/23/20   Myrlene Broker, MD  FLUoxetine (PROZAC) 40 MG capsule Take 1 capsule (40 mg total) by mouth daily. 06/23/20   Myrlene Broker, MD  furosemide (LASIX) 40 MG tablet TAKE 1 TABLET BY MOUTH TWICE DAILY EMERGENCY REFILL FAXED DR. 12/16/19   Antoine Poche, MD  gabapentin (NEURONTIN) 600 MG tablet Take 600 mg 3 (three) times daily by mouth. 01/29/17    [provider]  ipratropium-albuterol (DUONEB) 0.5-2.5 (3) MG/3ML SOLN Inhale 3 mLs into the lungs every 6 (six) hours as needed. 09/18/18   [provider]  magnesium oxide (MAG-OX) 400 (241.3 Mg) MG tablet TAKE 1 TABLET BY MOUTH TWICE DAILY 01/05/20   Antoine Poche, MD  meloxicam (MOBIC) 7.5 MG tablet Take 7.5 mg by mouth daily. 07/16/19   [provider]  methocarbamol (ROBAXIN) 500 MG tablet Take 500 mg by mouth 2 (two) times daily as needed. 07/04/19   [provider]  oxyCODONE (OXY IR/ROXICODONE) 5 MG immediate release tablet Take 5 mg by mouth every 6 (six) hours as needed. 08/05/19   [provider]  OZEMPIC, 0.25 OR 0.5 MG/DOSE, 2 MG/1.5ML SOPN Inject 1.5 mLs into the skin once a week. 02/06/20   [provider]  rosuvastatin (CRESTOR) 5 MG  tablet Take 5 mg by mouth daily. 02/03/20   [provider]  SPIRIVA HANDIHALER 18 MCG inhalation capsule Place 1 capsule into inhaler and inhale daily. 02/06/20   [provider]  spironolactone (ALDACTONE) 25 MG tablet Take 25 mg by mouth daily. 01/21/19   [provider]  traZODone (DESYREL) 50 MG tablet TAKE 1 AND 1/2 TABLETS BY MOUTH AT BEDTIME 06/23/20   Myrlene Broker, MD    Physical Exam:  Constitutional: Elderly female who appears to be in no acute distress at this time Vitals:   08/12/20 1813  BP: 122/73  Pulse: 67  Resp: 17  Temp: 98.7 F (37.1 C)  TempSrc: Axillary   Eyes: PERRL, lids and conjunctivae normal ENMT: Mucous membranes are moist. Posterior pharynx clear of any exudate or lesions.  Neck: normal, supple, no masses, no thyromegaly Respiratory: clear to auscultation bilaterally, no wheezing, no crackles. Normal respiratory effort. No accessory muscle use.  Cardiovascular: Regular rate and rhythm, no murmurs / rubs / gallops. No extremity edema. 2+ pedal pulses. No carotid bruits.  Abdomen: no tenderness, no masses palpated. No  hepatosplenomegaly. Bowel sounds positive.  Musculoskeletal: no clubbing / cyanosis. No joint deformity upper and lower extremities. Good ROM, no contractures. Normal muscle tone.  Skin: no rashes, lesions, ulcers. No induration Neurologic: CN 2-12 grossly intact. Sensation intact, DTR normal. Strength 5/5 in all 4.  Psychiatric: Normal judgment and insight. Alert and oriented x 3.  Flat affect.    Labs on Admission: I have personally reviewed following labs and imaging studies  CBC: No results for input(s): WBC, NEUTROABS, HGB, HCT, MCV, PLT in the last 168 hours. Basic Metabolic Panel: No results for input(s): NA, K, CL, CO2, GLUCOSE, BUN, CREATININE, CALCIUM, MG, PHOS in the last 168 hours. GFR: CrCl cannot be calculated (Patient's most recent lab result is older than the maximum 21 days allowed.). Liver Function Tests: No results for input(s): AST, ALT, ALKPHOS, BILITOT, PROT, ALBUMIN in the last 168 hours. No results for input(s): LIPASE, AMYLASE in the last 168 hours. No results for input(s): AMMONIA in the last 168 hours. Coagulation Profile: No results for input(s): INR, PROTIME in the last 168 hours. Cardiac Enzymes: No results for input(s): CKTOTAL, CKMB, CKMBINDEX, TROPONINI in the last 168 hours. BNP (last 3 results) No results for input(s): PROBNP in the last 8760 hours. HbA1C: No results for input(s): HGBA1C in the last 72 hours. CBG: No results for input(s): GLUCAP in the last 168 hours. Lipid Profile: No results for input(s): CHOL, HDL, LDLCALC, TRIG, CHOLHDL, LDLDIRECT in the last 72 hours. Thyroid Function Tests: No results for input(s): TSH, T4TOTAL, FREET4, T3FREE, THYROIDAB in the last 72 hours. Anemia Panel: No results for input(s): VITAMINB12, FOLATE, FERRITIN, TIBC, IRON, RETICCTPCT in the last 72 hours. Urine analysis:    Component Value Date/Time   COLORURINE YELLOW 05/03/2017 1554   APPEARANCEUR CLEAR 05/03/2017 1554   LABSPEC 1.014 05/03/2017 1554    PHURINE 5.0 05/03/2017 1554   GLUCOSEU NEGATIVE 05/03/2017 1554   HGBUR NEGATIVE 05/03/2017 1554   BILIRUBINUR NEGATIVE 05/03/2017 1554   KETONESUR NEGATIVE 05/03/2017 1554   PROTEINUR NEGATIVE 05/03/2017 1554   NITRITE NEGATIVE 05/03/2017 1554   LEUKOCYTESUR NEGATIVE 05/03/2017 1554   Sepsis Labs: No results found for this or any previous visit (from the past 240 hour(s)).   Radiological Exams on Admission: No results found.  EKG: Independently reviewed.  Sinus rhythm at 76 bpm with signs of deepening Q waves in lead 1  Assessment/Plan Elevated troponin: Acute.  Patient presented after complaining of complaints of substernal chest pain that she reports feels like someone sitting on her chest.  At the outside facility high-sensitivity troponins were elevated up to 369.  Patient was noted to have Q waves on EKG and echocardiogram revealed EF of 30 to 35% when her previous echocardiogram had revealed EF of greater than 75% back in 2018.  Due to the significant reduced in function transfer was requested for need of cardiac cath. -Admit to a cardiac telemetry bed -Continue heparin drip per pharmacy -N.p.o. after midnight  -Check lipid panel and hemoglobin A1c -Continue Asa and plavix -Appreciate cardiology consultative services,  will follow-up for any further recommendations  Systolic congestive heart failure: Acute.  Patient does not appear acutely fluid overloaded at this time.  Echocardiogram at the outside facility and noted EF of 30 to 35%.   -Strict intake and output -Daily weights -Cardiology had started patient on Lasix 40 mg IV twice daily  Leukocytosis: Acute. WBC elevated 11.1.  Records note patient's white blood cell count was normal yesterday. -Recheck CBC in a.m.  Chronic respiratory failure with hypoxia COPD: Patient without any acute exacerbation of her COPD.  She chronically reports being on 2 L nasal cannula oxygen. -Continue nasal cannula oxygen at 2 L -DuoNebs  as needed  Generalized weakness: Records note patient has been having difficulty walking at baseline with reports of multiple falls.  During her hospital stay patient was noted to have continued weakness for which rehab had initially been recommended. -PT consulted scheduled to start on 5/14  -Transitions of care consulted for possible need of placement  Metabolic encephalopathy: Patient appears to be alert and oriented x3 at this time, but reports intermittent episodes of confusion.  Unclear cause of patient's symptoms. -Continue to monitor  Hyponatremia: Acute.  Sodium noted to be 124 on admission.  Question if secondary to patient being fluid overloaded. -Check urine sodium and urine osmolarity -Continue to monitor and treat as needed  Diabetes mellitus type 2 -Hypoglycemic protocols -Follow-up hemoglobin A1c -Hold Januvia -CBGs before every meal and at bedtime  Recent UTI: Patient was treated with IV Rocephin during hospitalization.  Lan to continue antibiotics at this time.  CKD stage III: At the outside facility patient's creatinine was noted to be around 1.3-1.5. -Continue to monitor kidney function with IV diuresis  Hyperlipidemia -Follow-up lipid panel -Continue Crestor   Tobacco abuse: Patient reports that she had continued to smoke cigarettes. -Nicotine patch offered -Continue to counsel on need of cessation of tobacco use  DVT prophylaxis: Heparin Code Status: Full Family Communication: none Disposition Plan: To be determined Consults called: Cardiology Admission status: Inpatient, require more than 2 midnight stay  Clydie Braun MD [facility]   If 7PM-7AM, please contact night-coverage   08/12/2020, 6:31 PM

## 2020-08-12 NOTE — Consult Note (Signed)
CONSULTATION NOTE   Patient Name: Mariah White Date of Encounter: 08/12/2020 Cardiologist: Dina Rich, MD Electrophysiologist: None Advanced Heart Failure: None   Chief Complaint   No chest pain  Patient Profile   70 yo female recently admitted to [facility] with UTI and altered mental status, subsequently developed chest pain and found to have a newly reduced LVEF down to 30 to 35% by echo.  Per Dr. Wyline Mood the patient was scheduled to be transferred to [facility] for cardiac catheterization.  HPI   Mariah White is a 70 y.o. female who is being seen today for the evaluation of new cardiomyopathy at the request of Dr. Katrinka Blazing. This is a 70 year old female patient of Dr. Wyline Mood with a history of shortness of breath and chronic diastolic congestive heart failure.  Her last echo in 2018 showed EF greater than 75%.  Other medical problems include COPD, history of stroke on aspirin and Plavix, hypertension and CKD 3.  Outside labs from yesterday indicated creatinine of 1.49 with a sodium of 129 and chloride of 93.  For 325 and 329 (high-sensitivity).  Currently she is chest pain-free.  She does report she has been short of breath.  PMHx   Past Medical History:  Diagnosis Date  . Anxiety   . Arthritis   . CHF (congestive heart failure) (HCC)   . Chronic back pain   . Chronic pain   . COPD (chronic obstructive pulmonary disease) (HCC)   . Coronary artery disease   . Depression   . GERD (gastroesophageal reflux disease)   . Hypertension   . Migraine   . Stroke [facility])     Past Surgical History:  Procedure Laterality Date  . ABDOMINAL HYSTERECTOMY    . breast lump removed    . BREAST SURGERY    . CHOLECYSTECTOMY    . FOOT SURGERY      FAMHx   Family History  Problem Relation Age of Onset  . Bipolar disorder Daughter   . Diabetes Mother   . Stroke Father   . Diabetes Sister   . Diabetes Brother   . Diabetes Brother   . Colon cancer Sister   . Colon cancer  Sister   . Pancreatic cancer Brother   . Heart attack Brother     SOCHx    reports that she has been smoking cigarettes. She started smoking about 61 years ago. She has been smoking about 1.00 pack per day. She has never used smokeless tobacco. She reports that she does not drink alcohol and does not use drugs.  Outpatient Medications   No current facility-administered medications on file prior to encounter.   Current Outpatient Medications on File Prior to Encounter  Medication Sig Dispense Refill  . albuterol (PROVENTIL HFA;VENTOLIN HFA) 108 (90 BASE) MCG/ACT inhaler Inhale 2 puffs into the lungs 2 (two) times daily.    Marland Kitchen ALPRAZolam (XANAX) 1 MG tablet Take 1 tablet (1 mg total) by mouth at bedtime. 30 tablet 2  . amLODipine (NORVASC) 5 MG tablet Take 1 tablet by mouth daily.    . ARIPiprazole (ABILIFY) 5 MG tablet Take 1 tablet (5 mg total) by mouth at bedtime. 90 tablet 2  . aspirin EC 81 MG tablet Take 81 mg by mouth daily.    . clopidogrel (PLAVIX) 75 MG tablet Take 75 mg by mouth daily.    . diclofenac sodium (VOLTAREN) 1 % GEL Apply 1 application topically every 4 (four) hours as needed.    Marland Kitchen  DULoxetine (CYMBALTA) 60 MG capsule Take 1 capsule (60 mg total) by mouth daily. 30 capsule 3  . FLUoxetine (PROZAC) 40 MG capsule Take 1 capsule (40 mg total) by mouth daily. 30 capsule 2  . furosemide (LASIX) 40 MG tablet TAKE 1 TABLET BY MOUTH TWICE DAILY EMERGENCY REFILL FAXED DR. 60 tablet 6  . gabapentin (NEURONTIN) 600 MG tablet Take 600 mg 3 (three) times daily by mouth.  3  . ipratropium-albuterol (DUONEB) 0.5-2.5 (3) MG/3ML SOLN Inhale 3 mLs into the lungs every 6 (six) hours as needed.    . magnesium oxide (MAG-OX) 400 (241.3 Mg) MG tablet TAKE 1 TABLET BY MOUTH TWICE DAILY 180 tablet 2  . meloxicam (MOBIC) 7.5 MG tablet Take 7.5 mg by mouth daily.    . methocarbamol (ROBAXIN) 500 MG tablet Take 500 mg by mouth 2 (two) times daily as needed.    Marland Kitchen oxyCODONE (OXY IR/ROXICODONE) 5  MG immediate release tablet Take 5 mg by mouth every 6 (six) hours as needed.    Marland Kitchen OZEMPIC, 0.25 OR 0.5 MG/DOSE, 2 MG/1.5ML SOPN Inject 1.5 mLs into the skin once a week.    . rosuvastatin (CRESTOR) 5 MG tablet Take 5 mg by mouth daily.    Marland Kitchen SPIRIVA HANDIHALER 18 MCG inhalation capsule Place 1 capsule into inhaler and inhale daily.    Marland Kitchen spironolactone (ALDACTONE) 25 MG tablet Take 25 mg by mouth daily.    . traZODone (DESYREL) 50 MG tablet TAKE 1 AND 1/2 TABLETS BY MOUTH AT BEDTIME 135 tablet 2    Inpatient Medications    Scheduled Meds:   Continuous Infusions: . heparin      PRN Meds: acetaminophen, ondansetron (ZOFRAN) IV   ALLERGIES   Allergies  Allergen Reactions  . Penicillins Itching    Has patient had a PCN reaction causing immediate rash, facial/tongue/throat swelling, SOB or lightheadedness with hypotension: Yes Has patient had a PCN reaction causing severe rash involving mucus membranes or skin necrosis: No Has patient had a PCN reaction that required hospitalization: No Has patient had a PCN reaction occurring within the last 10 years: No If all of the above answers are "NO", then may proceed with Cephalosporin use.     ROS   Pertinent items noted in HPI and remainder of comprehensive ROS otherwise negative.  Vitals   Vitals:   08/12/20 1813  BP: 122/73  Pulse: 67  Resp: 17  Temp: 98.7 F (37.1 C)  TempSrc: Axillary   No intake or output data in the 24 hours ending 08/12/20 1948 There were no vitals filed for this visit.  Physical Exam   General appearance: alert, no distress and Sitting upright, eating a sandwich Neck: JVD - a few cm above sternal notch, no carotid bruit and thyroid not enlarged, symmetric, no tenderness/mass/nodules Lungs: diminished breath sounds bibasilar Heart: regular rate and rhythm Abdomen: soft, non-tender; bowel sounds normal; no masses,  no organomegaly and Obese Extremities: extremities normal, atraumatic, no cyanosis  or edema Pulses: 2+ and symmetric Skin: Skin color, texture, turgor normal. No rashes or lesions Neurologic: Grossly normal Psych: Flat affect  Labs   No results found for this or any previous visit (from the past 48 hour(s)).  ECG   Pending  Telemetry   Sinus rhythm in the 70's- Personally Reviewed  Radiology   No results found.  Cardiac Studies   N/A  Impression   Principal Problem:   Acute systolic (congestive) heart failure (HCC) Active Problems:   CAD (coronary artery  disease)   Essential hypertension   Elevated troponin   Pressure injury of skin   Recommendation   1. Mariah White was found to have acute systolic congestive heart failure which is presumably new compared to prior echo in 2018.  She had chest pain but had 2 flat elevated troponins.  She is currently on a heparin drip.  She was transferred for heart catheterization however recent labs showed a low sodium and elevated creatinine most likely consistent with acute congestive heart failure.  I think she will need more diuresis prior to her being a candidate for catheterization.  Most likely that would be next week.  Would recommend starting IV Lasix 40 mg twice daily.  Check labs including a BNP in the a.m. as well as a lipid profile.  Keep n.p.o. after midnight in case the oncoming team desires an earlier ischemic evaluation.  Thanks for the consultation.  Time Spent Directly with Patient:  I have spent a total of 45 minutes with the patient reviewing hospital notes, telemetry, EKGs, labs and examining the patient as well as establishing an assessment and plan that was discussed personally with the patient.  > 50% of time was spent in direct patient care.  Length of Stay:  LOS: 0 days   Chrystie Nose, MD, [facility], FACP  [place]  [facility]  Medical Director of the Advanced Lipid Disorders &  Cardiovascular Risk Reduction Clinic Diplomate of the American Board of Clinical  Lipidology Attending Cardiologist  Direct Dial: [phone]  Fax: [phone]  Website:  [email or web address] 08/12/2020, 7:48 PM

## 2020-08-12 NOTE — Progress Notes (Signed)
ANTICOAGULATION CONSULT NOTE  Pharmacy Consult for heparin Indication: chest pain/ACS  Allergies  Allergen Reactions  . Penicillins Itching    Has patient had a PCN reaction causing immediate rash, facial/tongue/throat swelling, SOB or lightheadedness with hypotension: Yes Has patient had a PCN reaction causing severe rash involving mucus membranes or skin necrosis: No Has patient had a PCN reaction that required hospitalization: No Has patient had a PCN reaction occurring within the last 10 years: No If all of the above answers are "NO", then may proceed with Cephalosporin use.     Patient Measurements:   Heparin Dosing Weight: pending  Vital Signs: Temp: 98.7 F (37.1 C) (05/12 1813) Temp Source: Axillary (05/12 1813) BP: 122/73 (05/12 1813) Pulse Rate: 67 (05/12 1813)  Labs: No results for input(s): HGB, HCT, PLT, APTT, LABPROT, INR, HEPARINUNFRC, HEPRLOWMOCWT, CREATININE, CKTOTAL, CKMB, TROPONINIHS in the last 72 hours.  CrCl cannot be calculated (Patient's most recent lab result is older than the maximum 21 days allowed.).   Medical History: Past Medical History:  Diagnosis Date  . Anxiety   . Arthritis   . CHF (congestive heart failure) (HCC)   . Chronic back pain   . Chronic pain   . COPD (chronic obstructive pulmonary disease) (HCC)   . Coronary artery disease   . Depression   . GERD (gastroesophageal reflux disease)   . Hypertension   . Migraine   . Stroke [facility])     Assessment: 55 yoF transferred from OSH on IV heparin for ACS r/o. Heparin infusion at 900 units/h - unclear start time per paper chart from outside facility. No AC PTA, CBC wnl at OSH labs.  Goal of Therapy:  Heparin level 0.3-0.7 units/ml Monitor platelets by anticoagulation protocol: Yes   Plan:  Continue heparin 900 units/h Check heparin level in ~6h from presumed start  Fredonia Highland, PharmD, BCPS, [facility] Clinical Pharmacist [phone] Please check AMION for all [facility]  numbers 08/12/2020

## 2020-08-12 NOTE — Progress Notes (Signed)
CRITICAL VALUE ALERT  Critical value receivedTroponin  Date of notification:  08/12/20  Time of notification:  2100  Critical value read back:Yes.    Nurse who received alert:  Lissa Hoard RN  MD notified (1st page):  Dr.Zierle Camillo Flaming  Time of first page: 2100  MD notified (2nd page):  Time of second page:  Responding MD:Dr.Zierle-Ghosh  Time MD responded2130

## 2020-08-12 NOTE — Telephone Encounter (Signed)
Spoke with Dr Sherryll Burger admitting physician at [facility]. Patient admitted with UTI, AMS. Later in admission developed chest pains. Troponins elevated, newly found to have LVEF down to 30-35% by echo. I cannot see the EKGs. We discussed if her other medical issues are improved that a cath would be indicated. Plan would be for transfer to [facility] medicine team, would need to have cardiology consulted once there. Would antcipate likely heart cath if her other medical issues are stabilized.   Dina Rich MD

## 2020-08-12 NOTE — Plan of Care (Signed)

## 2020-08-13 DIAGNOSIS — R778 Other specified abnormalities of plasma proteins: Secondary | ICD-10-CM | POA: Diagnosis not present

## 2020-08-13 DIAGNOSIS — I5021 Acute systolic (congestive) heart failure: Secondary | ICD-10-CM | POA: Diagnosis not present

## 2020-08-13 DIAGNOSIS — I25118 Atherosclerotic heart disease of native coronary artery with other forms of angina pectoris: Secondary | ICD-10-CM

## 2020-08-13 DIAGNOSIS — J441 Chronic obstructive pulmonary disease with (acute) exacerbation: Secondary | ICD-10-CM

## 2020-08-13 DIAGNOSIS — E871 Hypo-osmolality and hyponatremia: Secondary | ICD-10-CM

## 2020-08-13 DIAGNOSIS — E785 Hyperlipidemia, unspecified: Secondary | ICD-10-CM

## 2020-08-13 LAB — GLUCOSE, CAPILLARY
Glucose-Capillary: 202 mg/dL — ABNORMAL HIGH (ref 70–99)
Glucose-Capillary: 108 mg/dL — ABNORMAL HIGH (ref 70–99)
Glucose-Capillary: 116 mg/dL — ABNORMAL HIGH (ref 70–99)
Glucose-Capillary: 129 mg/dL — ABNORMAL HIGH (ref 70–99)
Glucose-Capillary: 109 mg/dL — ABNORMAL HIGH (ref 70–99)

## 2020-08-13 LAB — CBC
HCT: 43.6 % (ref 36.0–46.0)
Hemoglobin: 14.3 g/dL (ref 12.0–15.0)
MCH: 28.1 pg (ref 26.0–34.0)
MCHC: 32.8 g/dL (ref 30.0–36.0)
MCV: 85.8 fL (ref 80.0–100.0)
Platelets: 318 10*3/uL (ref 150–400)
RBC: 5.08 MIL/uL (ref 3.87–5.11)
RDW: 14.3 % (ref 11.5–15.5)
WBC: 11.3 10*3/uL — ABNORMAL HIGH (ref 4.0–10.5)
nRBC: 0 % (ref 0.0–0.2)

## 2020-08-13 LAB — LIPID PANEL
Cholesterol: 205 mg/dL — ABNORMAL HIGH (ref 0–200)
HDL: 53 mg/dL (ref >40)
LDL Cholesterol: 128 mg/dL — ABNORMAL HIGH (ref 0–99)
Total CHOL/HDL Ratio: 3.9 RATIO
Triglycerides: 118 mg/dL (ref <150)
VLDL: 24 mg/dL (ref 0–40)

## 2020-08-13 LAB — HEPARIN LEVEL (UNFRACTIONATED)
Heparin Unfractionated: 0.35 IU/mL (ref 0.30–0.70)
Heparin Unfractionated: 0.44 IU/mL (ref 0.30–0.70)
Heparin Unfractionated: >1.1 IU/mL — ABNORMAL HIGH (ref 0.30–0.70)

## 2020-08-13 LAB — BASIC METABOLIC PANEL
Anion gap: 9 (ref 5–15)
BUN: 48 mg/dL — ABNORMAL HIGH (ref 8–23)
CO2: 27 mmol/L (ref 22–32)
Calcium: 8.5 mg/dL — ABNORMAL LOW (ref 8.9–10.3)
Chloride: 90 mmol/L — ABNORMAL LOW (ref 98–111)
Creatinine, Ser: 1.49 mg/dL — ABNORMAL HIGH (ref 0.44–1.00)
GFR, Estimated: 38 mL/min — ABNORMAL LOW (ref >60)
Glucose, Bld: 149 mg/dL — ABNORMAL HIGH (ref 70–99)
Potassium: 3.8 mmol/L (ref 3.5–5.1)
Sodium: 126 mmol/L — ABNORMAL LOW (ref 135–145)

## 2020-08-13 LAB — BRAIN NATRIURETIC PEPTIDE: B Natriuretic Peptide: 1089.8 pg/mL — ABNORMAL HIGH (ref 0.0–100.0)

## 2020-08-13 LAB — CREATININE, URINE, RANDOM: Creatinine, Urine: 40.37 mg/dL

## 2020-08-13 LAB — TROPONIN I (HIGH SENSITIVITY): Troponin I (High Sensitivity): 151 ng/L (ref <18)

## 2020-08-13 LAB — OSMOLALITY, URINE: Osmolality, Ur: 430 mOsm/kg (ref 300–900)

## 2020-08-13 LAB — SODIUM, URINE, RANDOM: Sodium, Ur: 73 mmol/L

## 2020-08-13 LAB — OSMOLALITY: Osmolality: 289 mOsm/kg (ref 275–295)

## 2020-08-13 MED ORDER — ALPRAZOLAM 0.25 MG PO TABS
0.2500 mg | ORAL_TABLET | Freq: Three times a day (TID) | ORAL | Status: DC | PRN
  Administered 2020-08-13 – 2020-08-20 (×10): 0.25 mg via ORAL
  Filled 2020-08-13 (×10): qty 1

## 2020-08-13 MED ORDER — SODIUM CHLORIDE 0.9 % IV BOLUS
500.0000 mL | Freq: Once | INTRAVENOUS | Status: AC
  Administered 2020-08-13: 500 mL via INTRAVENOUS

## 2020-08-13 MED ORDER — ROSUVASTATIN CALCIUM 20 MG PO TABS
20.0000 mg | ORAL_TABLET | Freq: Every day | ORAL | Status: DC
  Administered 2020-08-14 – 2020-08-15 (×2): 20 mg via ORAL
  Filled 2020-08-13 (×2): qty 1

## 2020-08-13 MED ORDER — HEPARIN (PORCINE) 25000 UT/250ML-% IV SOLN
850.0000 [IU]/h | INTRAVENOUS | Status: DC
  Administered 2020-08-13 (×2): 600 [IU]/h via INTRAVENOUS
  Administered 2020-08-15: 850 [IU]/h via INTRAVENOUS
  Filled 2020-08-13 (×2): qty 250

## 2020-08-13 MED ORDER — LORAZEPAM 2 MG/ML IJ SOLN: 1.0000 mg | Freq: Once | INTRAMUSCULAR | Status: DC

## 2020-08-13 MED ORDER — ALPRAZOLAM 0.25 MG PO TABS
0.2500 mg | ORAL_TABLET | Freq: Once | ORAL | Status: AC
  Administered 2020-08-13: 0.25 mg via ORAL
  Filled 2020-08-13: qty 1

## 2020-08-13 MED ORDER — METOPROLOL TARTRATE 12.5 MG HALF TABLET
12.5000 mg | ORAL_TABLET | Freq: Two times a day (BID) | ORAL | Status: DC
  Administered 2020-08-13 – 2020-08-20 (×15): 12.5 mg via ORAL
  Filled 2020-08-13 (×15): qty 1

## 2020-08-13 NOTE — Progress Notes (Signed)
ANTICOAGULATION CONSULT NOTE - Follow Up Consult  Pharmacy Consult for heparin Indication: chest pain/ACS  Labs: Recent Labs    08/12/20 1943 08/12/20 2115 08/13/20 0011  HGB 14.3  --  14.3  HCT 42.6  --  43.6  PLT 297  --  318  HEPARINUNFRC  --   --  >1.10*  CREATININE 1.48*  --  1.49*  TROPONINIHS 197* 164*  --     Assessment: 69yo female supratherapeutic on heparin with initial dosing for CP; no gtt issues or signs of bleeding per RN.  Goal of Therapy:  Heparin level 0.3-0.7 units/ml   Plan:  Will hold heparin gtt x1h then decrease heparin gtt by 4 units/kg/hr to 600 units/hr and check level in 6 hours.    Vernard Gambles, PharmD, BCPS  08/13/2020,1:11 AM

## 2020-08-13 NOTE — Progress Notes (Addendum)
Progress Note  Patient Name: Mariah White Date of Encounter: 08/13/2020  [facility] Cardiologist: Dina Rich, MD   Subjective   No acute overnight events. Still somewhat confused this morning and not fully oriented. No complaints though. No chest pain. She reports her breathing is better. Currently on 3L of O2 (on 2L at home). Able to lay almost completely flat right now comfortably. She still has a wet sounding cough.  Inpatient Medications    Scheduled Meds: . ARIPiprazole  5 mg Oral QHS  . aspirin EC  81 mg Oral Daily  . clopidogrel  75 mg Oral Daily  . DULoxetine  60 mg Oral Daily  . FLUoxetine  40 mg Oral Daily  . furosemide  40 mg Intravenous BID  . insulin aspart  0-9 Units Subcutaneous TID WC  . LORazepam  1 mg Intravenous Once  . nicotine  14 mg Transdermal Daily  . rosuvastatin  5 mg Oral Daily   Continuous Infusions: . heparin 600 Units/hr (08/13/20 0451)   PRN Meds: acetaminophen, ipratropium-albuterol, ondansetron (ZOFRAN) IV, oxyCODONE   Vital Signs    Vitals:   08/13/20 0413 08/13/20 0526 08/13/20 0653 08/13/20 0800  BP: 112/67 125/79 133/88 (!) 128/95  Pulse: 79   82  Resp: 19   16  Temp: 98.2 F (36.8 C)   97.9 F (36.6 C)  TempSrc: Oral   Oral  SpO2: 96%   100%  Weight: 65.2 kg       Intake/Output Summary (Last 24 hours) at 08/13/2020 0844 Last data filed at 08/13/2020 0006 Gross per 24 hour  Intake --  Output 450 ml  Net -450 ml   Last 3 Weights 08/13/2020 02/13/2020 08/08/2019  Weight (lbs) 143 lb 11.8 oz 162 lb 170 lb  Weight (kg) 65.2 kg 73.483 kg 77.111 kg  Some encounter information is confidential and restricted. Go to Review Flowsheets activity to see all data.      Telemetry    Normal sinus rhythm with rates in the 60s to 70s. - Personally Reviewed  ECG    No new ECG tracing today. - Personally Reviewed  Physical Exam   GEN: No acute distress.   Neck: No JVD Cardiac: RRR. No murmurs, rubs, or gallops. Radial  pulses 2+ and equal bilaterally. Respiratory: No increased work of breathing. Crackles noted in bilateral bases. Occasional wheeze noted. GI: Soft, non-distended, and non-tender.  MS: No lower extremity edema. No deformity. Skin: Warm and dry. Neuro: Still confused and not fully oriented (oriented to name, birthday, place (city), month but not year (stated it was [place]), and president. No focal deficits. Psych: Still confused. Staring off into room.  Labs    High Sensitivity Troponin:   Recent Labs  Lab 08/12/20 1943 08/12/20 2115 08/13/20 0011  TROPONINIHS 197* 164* 151*      Chemistry Recent Labs  Lab 08/12/20 1943 08/13/20 0011  NA 124* 126*  K 3.9 3.8  CL 88* 90*  CO2 25 27  GLUCOSE 150* 149*  BUN 46* 48*  CREATININE 1.48* 1.49*  CALCIUM 8.4* 8.5*  PROT 7.1  --   ALBUMIN 2.8*  --   AST 31  --   ALT 24  --   ALKPHOS 93  --   BILITOT 0.9  --   GFRNONAA 38* 38*  ANIONGAP 11 9     Hematology Recent Labs  Lab 08/12/20 1943 08/13/20 0011  WBC 11.1* 11.3*  RBC 4.98 5.08  HGB 14.3 14.3  HCT 42.6  43.6  MCV 85.5 85.8  MCH 28.7 28.1  MCHC 33.6 32.8  RDW 14.4 14.3  PLT 297 318    BNP Recent Labs  Lab 08/13/20 0011  BNP 1,089.8*     DDimer No results for input(s): DDIMER in the last 168 hours.   Radiology    No results found.  Cardiac Studies   Echocardiogram 08/06/2020 [facility]): - LV mildly dilated with mildly to moderately increased wall thickness. LVEF 30-35%. Atypical septal motion. Hypokinesis of anterolateral wall and mid inferolateral wall. LV diastolic function is indeterminate.  - RV normal in sizes. Normal systolic function.  - Left atrium mildltly to moderately dilated.  - Right atrium normal in size.  - No significant valvular disease.  Patient Profile     70 y.o. female with a history of chronic diastolic CHF with EF >75% on last Echo in 2018, prior stroke on Aspirin and Plavix, COPD, hypertension and CKD stage III. She was  admitted to [facility] on 08/06/2020 with UTI and altered mental status. She subsequently developed chest pain and found to have a newly reduced LVEF down to 30-35%. Decision was made to transfer patient to [facility] for cardiac catheterization.  Assessment & Plan    New Onset Acute Systolic CHF - BNP 1,089. - Echo at [facility] showed LVEF of 30-35% with atypical septal motion and hypokinesis of anterolateral wall and mid inferolateral wall. EF down from 75% in 2018.  - Currently on IV Lasix 40mg  twice daily. Only 450 mL of documented output after first dose but don't know if this is accurate. Will need to monitor sodium and creatinine closely with diuresis. - Sill has crackles on exam but otherwise dose not appear significantly volume overloaded.  - Continue current dose of IV Lasix. - Will hold on ACE/ARB/ARNI for now given renal function. - Will start Lopressor 12.5mg  twice daily. Can transition to Toprol-XL prior to discharge. - Continue to monitor daily weights, strict I/O's, and renal function.  Demand Ischemia vs NSTEMI - High-sensitivity troponin peaked at 369 at [facility]. Trending down here 197 >> 164 >>151. - Echo as above. - No chest pain.  - Continue IV Heparin for now. - Continue Aspirin. Will start Lopressor 12.5mg  twice daily and Crestor 20mg  daily. - Plan is for cardiac catheterization hopefully on Monday after she is diuresed more and electrolytes stabilize. I will go ahead and put her on the board for LHC. Will need to consent  her over the weekend (she is currently not oriented enough to consent).   Possible CAD - History of CAD listed in chart but I do not see this mentioned in outpatient Cardiology notes. Patient tells me this morning that she has had a cardiac cath before a long time ago and states she had a stent placed. However, she is still confused and not fully oriented so unsure if this is accurate. - Continue Aspirin. Will start Lopressor 12.5mg   twice daily and Crestor 20mg  daily. - Plan is for cath as above.  Hypertension - BP mildly elevated (diastolic BP in the 90s). - Will add Lopressor as above. - Home Amlodipine currently on hold.  Hyperlipidemia - Lipid panel this admission: Total Cholesterol 205, Triglycerides 118, HDL 53, LDL 128.  - LDL goal <70 given history of stroke and CAD. - Will start Crestor 20mg  daily. - Will need repeat lipid panel and LFTs.  CKD Stage III - Creatinine 2.10 on admission to [facility]. Improved to 1.49 today. Baseline creatinine  1.5 to 1.6 in 06/2020. - Continue to monitor closely with diuresis.  History of Stroke - On Aspirin and Plavix. - Will start statin as above.  Hyponatremia - Sodium dropped as low as 124 on 5/12. Stable at 126 today. - Possibly due to hypervolemia. Continue diuresis as above. - Management per primary team.  Otherwise, per primary team: - UTI - Metabolic encephalopathy - COPD with chronic respiratory failure on 2L of O2 at home - Type 2 diabetes - Generalized weakness - Tobacco abuse   For questions or updates, please contact [facility] Please consult [email or web address] for contact info under        Signed, Corrin Parker, PA-C  08/13/2020, 8:44 AM     Patient seen and examined. Agree with assessment and plan.  No chest pain presently.  Admits to confusion earlier this morning.  Troponins mildly elevated but declining, not consistent with STEMI and most likely contributed by demand ischemia secondary to CHF.  BNP today elevated at 1089.  Echo Doppler study at [facility] showed EF 30 to 35% with hypokinesis anterolaterally and mid inferolateral he.  Mild to moderate LA dilation.  Resting pulse is in the 80s.  Blood pressure 130/86.  JVD.  She is kyphotic.  There is an area of ecchymosis in the right upper back from a prior fall last week.  There are decreased breath sounds at bases with faint basilar rales.  Is regular with rates in the 60s to 70s.   Abdomen is soft and nontender.  There is no significant edema.  Serum sodium remains low, was 124, today 126.  She is not on any thiazide diuretic.  We will make certain thyroid function has been checked.  May be contributed by CHF, if sodium remains low with diuresis, consider SIADH evaluation.  Creatinine today 1.49.  Plan to stabilize over the weekend.  Will initiate low-dose beta-blocker and statin.  Will need definitive cardiac catheterization and will tentatively set up for Monday   Lennette Bihari, MD, [facility] 08/13/2020 10:43 AM

## 2020-08-13 NOTE — Progress Notes (Addendum)
ANTICOAGULATION CONSULT NOTE  Pharmacy Consult for IV Heparin Indication: chest pain/ACS  Allergies  Allergen Reactions  . Penicillins Itching    Has patient had a PCN reaction causing immediate rash, facial/tongue/throat swelling, SOB or lightheadedness with hypotension: Yes Has patient had a PCN reaction causing severe rash involving mucus membranes or skin necrosis: No Has patient had a PCN reaction that required hospitalization: No Has patient had a PCN reaction occurring within the last 10 years: No If all of the above answers are "NO", then may proceed with Cephalosporin use.     Patient Measurements: Weight: 65.2 kg (143 lb 11.8 oz) Heparin Dosing Weight: 61.4 kg (based on ht from 11/21)  Vital Signs: Temp: 97.5 F (36.4 C) (05/13 1618) Temp Source: Oral (05/13 1618) BP: 94/59 (05/13 1618) Pulse Rate: 73 (05/13 1618)  Labs: Recent Labs    08/12/20 1943 08/12/20 2115 08/13/20 0011 08/13/20 1027 08/13/20 1617  HGB 14.3  --  14.3  --   --   HCT 42.6  --  43.6  --   --   PLT 297  --  318  --   --   HEPARINUNFRC  --   --  >1.10* 0.44 0.35  CREATININE 1.48*  --  1.49*  --   --   TROPONINIHS 197* 164* 151*  --   --     CrCl cannot be calculated (Unknown ideal weight.).   Medical History: Past Medical History:  Diagnosis Date  . Anxiety   . Arthritis   . CHF (congestive heart failure) (HCC)   . Chronic back pain   . Chronic pain   . COPD (chronic obstructive pulmonary disease) (HCC)   . Coronary artery disease   . Depression   . GERD (gastroesophageal reflux disease)   . Hypertension   . Migraine   . Stroke [facility])     Assessment: 70 yr old female transferred from OSH on IV heparin for R/O ACS. Pt was on no anticoagulant PTA to OSH. Cardiac cath planned for Monday. Plans are for cath on Monday  Confirmatory heparin level on heparin infusion at 600 units/hr drawn ~6 hrs after initial therapeutic heparin level was 0.35 units/ml, which is within the goal  range for this pt. H/H 14.3/43.6, plt 318. Per RN, no issues with IV or bleeding observed.  Goal of Therapy:  Heparin level 0.3-0.7 units/ml Monitor platelets by anticoagulation protocol: Yes   Plan:  Continue heparin infusion at 600 units/hr Monitor daily heparin level, CBC Monitor for bleeding F/U after cath on Monday  Vicki Mallet, PharmD, BCPS, [facility] Clinical Pharmacist

## 2020-08-13 NOTE — Progress Notes (Signed)
PROGRESS NOTE    Mariah White  PXT:[id number] DOB: [birth year] DOA: 08/12/2020 PCP: [facility], [facility]    Brief Narrative:  Mariah White is a 70 year old female with past medical history significant for essential hypertension, chronic diastolic congestive heart failure, CAD, prior CVA, COPD on 2 L nasal cannula oxygen at baseline presents as a transfer from [facility] for chest pain.  Patient had been hospitalized at [facility] from 5/6-08/2010; after initially presenting following fall at home with complaints of left hip pain.  She reports at that time she was reaching for cigarettes while in bed and fell without loss of consciousness.  CT scan of her head without contrast did not show any acute intracranial abnormalities and x-rays pelvis with no acute fractures.  While in the ED at [facility], patient reported to complain of some dysuria and was noted to be with some mild hypertension.  Hypotension resolved with IV fluids and she was started on IV antibiotics for urinary tract infection with ceftriaxone.  During her hospital stay, patient has been noted to be intermittently confused at times with plan to transfer patient to a rehab facility due to ongoing weakness.  Patient then started to complain of chest pain 2-3 days prior to transfer with localization in the middle of her chest with pressure-like feeling.  Also associated with shortness of breath with exertion, nausea/vomiting and diaphoresis. Chest pain was reportedly brought to the provider's attention the day prior to transfer and EKG was found to have concern for Q waves.  High-sensitivity troponins were noted to be elevated 325->369.  Echocardiogram was obtained which showed EF of 30 to 35%.  Patient has been started on heparin IV as well as evaluated by cardiology who recommended cardiac cath.  Case was discussed with Dr. Wyline Mood of cardiology here at [facility] as well as Dr. Ophelia Charter of the [facility].  Patient  accepted to a cardiac telemetry bed as inpatient.    Assessment & Plan:   Principal Problem:   Elevated troponin Active Problems:   COPD (chronic obstructive pulmonary disease) (HCC)   CAD (coronary artery disease)   Essential hypertension   Pressure injury of skin   Acute systolic (congestive) heart failure (HCC)   Leukocytosis   NSTEMI (non-ST elevated myocardial infarction) (HCC)   Acute metabolic encephalopathy   Hyponatremia   Hyperlipidemia with target LDL less than 70   NSTEMI Systolic congestive heart failure, acute, new diagnosis Patient presenting from [facility] with chest pain.  TTE ED at outside hospital notable for EF 30-35%.  BNP 1089.  On arrival, high-sensitivity troponin elevated 197>164>151.  EKG with NSR, rate 76, QTc 499, T wave inversions in 1/aVL, lead II, V4-V6; there is change in comparison to EKG dated 05/04/2017 in lead II, V4-V6, otherwise unchanged. --Cardiology following, appreciate assistance --Furosemide 40 mg IV every 12 hours --Heparin drip --Metoprolol tartrate 12.5 mg p.o. twice daily --Holding ACE inhibitor/ARB/ARNI given renal function --Strict I's and O's and daily weights --Cardiology planning left heart catheterization 5/16 --Continue monitor on telemetry  Chronic respiratory failure with hypoxia, 2L [place] baseline COPD --Continue supplemental oxygen, maintain SPO2 greater than 88%, currently on 3 L nasal cannula --DuoNebs as needed  Hyponatremia On transfer, sodium noted to be 124.  Suspect volume overload in the setting of decompensated congestive heart failure.  Urine sodium 73. --Na 124>126 --Check urine/serum osmolality --Check TSH --IV furosemide as above  Recent UTI Patient was treated with IV Rocephin during her most recent hospitalization.  Continues with leukocytosis. --Repeat urinalysis --CBC in am  Type 2 diabetes mellitus Hemoglobin A1c 6.0, well controlled.  On Ozempic and Januvia 25 mg p.o. daily outpatient. --SSI for  coverage --CBGs QAC/HS  CKD stage IIIb Baseline creatinine 1.5-1.6.  Creatinine on admission to [facility] 2.10. -- BMP daily in the setting of active IV diuresis as above  Hyperlipidemia HDL 53, LDL 128. --Continue Crestor 20 mg p.o. daily  CAD  --Aspirin 81 mg p.o. daily, Plavix 75 mg p.o. daily, statin  Depression/anxiety --Abilify 5 mg p.o. nightly --Duloxetine 60 mg p.o. daily --Fluoxetine 40 mg p.o. daily  Tobacco use disorder Continues to endorse cigarette use.  Counseled on need for complete cessation. --Nicotine patch  Generalized weakness Records on transfer report patient has been having difficulty walking at baseline with multiple falls.  During her hospitalization at [facility], patient was noted to have continued weakness for which rehab had initially been recommended. --PT/OT evaluation --TOC consult for possible need of placement  DVT prophylaxis: Heparin drip   Code Status: Full Code Family Communication: No family present at bedside this morning  Disposition Plan:  Level of care: Telemetry Cardiac Status is: Inpatient  Remains inpatient appropriate because:Persistent severe electrolyte disturbances, Ongoing diagnostic testing needed not appropriate for outpatient work up, Unsafe d/c plan, IV treatments appropriate due to intensity of illness or inability to take PO and Inpatient level of care appropriate due to severity of illness   Dispo: The patient is from: Home              Anticipated d/c is to: SNF              Patient currently is not medically stable to d/c.   Difficult to place patient No   Consultants:   Cardiology  Procedures:   None  Antimicrobials:   Ceftriaxone   Subjective: Patient seen and examined at bedside, resting comfortably.  Slightly confused.  She reports dyspnea improved.  Denies further chest pain.  No other complaints at this time.  Further denies headache, no dizziness, no chest pain, no palpitations, no  fever/chills/night sweats, no nausea/vomiting/diarrhea, no abdominal pain, no cough/congestion.  No acute events overnight per nursing staff.  Objective: Vitals:   08/13/20 0653 08/13/20 0800 08/13/20 0958 08/13/20 1209  BP: 133/88 (!) 128/95  104/73  Pulse:  82 79 76  Resp:  16  16  Temp:  97.9 F (36.6 C)  (!) 97.5 F (36.4 C)  TempSrc:  Oral  Oral  SpO2:  100%  96%  Weight:        Intake/Output Summary (Last 24 hours) at 08/13/2020 1301 Last data filed at 08/13/2020 1018 Gross per 24 hour  Intake 32.37 ml  Output 450 ml  Net -417.63 ml   Filed Weights   08/13/20 0413  Weight: 65.2 kg    Examination:  General exam: Appears calm and comfortable, slightly confused Respiratory system: Decreased breath sounds bilateral bases with mild crackles and bilateral late expiratory wheezing, normal [place] effort,  On 3 L nasal cannula with SPO2 96% Cardiovascular system: S1 & S2 heard, RRR. No JVD, murmurs, rubs, gallops or clicks. No pedal edema. Gastrointestinal system: Abdomen is nondistended, soft and nontender. No organomegaly or masses felt. Normal bowel sounds heard. Central nervous system: Alert. No focal neurological deficits. Extremities: Symmetric 5 x 5 power. Skin: No rashes, lesions or ulcers Psychiatry: Judgement and insight appear poor. Mood & affect appropriate.     Data Reviewed: I have personally reviewed  following labs and imaging studies  CBC: Recent Labs  Lab 08/12/20 1943 08/13/20 0011  WBC 11.1* 11.3*  HGB 14.3 14.3  HCT 42.6 43.6  MCV 85.5 85.8  PLT 297 318   Basic Metabolic Panel: Recent Labs  Lab 08/12/20 1943 08/13/20 0011  NA 124* 126*  K 3.9 3.8  CL 88* 90*  CO2 25 27  GLUCOSE 150* 149*  BUN 46* 48*  CREATININE 1.48* 1.49*  CALCIUM 8.4* 8.5*   GFR: CrCl cannot be calculated (Unknown ideal weight.). Liver Function Tests: Recent Labs  Lab 08/12/20 1943  AST 31  ALT 24  ALKPHOS 93  BILITOT 0.9  PROT 7.1  ALBUMIN 2.8*   No  results for input(s): LIPASE, AMYLASE in the last 168 hours. No results for input(s): AMMONIA in the last 168 hours. Coagulation Profile: No results for input(s): INR, PROTIME in the last 168 hours. Cardiac Enzymes: No results for input(s): CKTOTAL, CKMB, CKMBINDEX, TROPONINI in the last 168 hours. BNP (last 3 results) No results for input(s): PROBNP in the last 8760 hours. HbA1C: Recent Labs    08/12/20 2115  HGBA1C 6.0*   CBG: Recent Labs  Lab 08/12/20 2114 08/13/20 0802 08/13/20 1208  GLUCAP 202* 108* 116*   Lipid Profile: Recent Labs    08/13/20 0011  CHOL 205*  HDL 53  LDLCALC 128*  TRIG 118  CHOLHDL 3.9   Thyroid Function Tests: No results for input(s): TSH, T4TOTAL, FREET4, T3FREE, THYROIDAB in the last 72 hours. Anemia Panel: No results for input(s): VITAMINB12, FOLATE, FERRITIN, TIBC, IRON, RETICCTPCT in the last 72 hours. Sepsis Labs: No results for input(s): PROCALCITON, LATICACIDVEN in the last 168 hours.  No results found for this or any previous visit (from the past 240 hour(s)).       Radiology Studies: No results found.      Scheduled Meds: . ARIPiprazole  5 mg Oral QHS  . aspirin EC  81 mg Oral Daily  . clopidogrel  75 mg Oral Daily  . DULoxetine  60 mg Oral Daily  . FLUoxetine  40 mg Oral Daily  . furosemide  40 mg Intravenous BID  . insulin aspart  0-9 Units Subcutaneous TID WC  . LORazepam  1 mg Intravenous Once  . metoprolol tartrate  12.5 mg Oral BID  . nicotine  14 mg Transdermal Daily  . [START ON 08/14/2020] rosuvastatin  20 mg Oral Daily   Continuous Infusions: . heparin 600 Units/hr (08/13/20 1018)     LOS: 1 day    Time spent: 42 minutes spent on chart review, discussion with nursing staff, consultants, updating family and interview/physical exam; more than 50% of that time was spent in counseling and/or coordination of care.    Alvira Philips [place], DO [facility] Available via Epic secure chat 7am-7pm After  these hours, please refer to coverage provider listed on [email or web address] 08/13/2020, 1:01 PM

## 2020-08-13 NOTE — Progress Notes (Signed)
ANTICOAGULATION CONSULT NOTE  Pharmacy Consult for heparin Indication: chest pain/ACS  Allergies  Allergen Reactions  . Penicillins Itching    Has patient had a PCN reaction causing immediate rash, facial/tongue/throat swelling, SOB or lightheadedness with hypotension: Yes Has patient had a PCN reaction causing severe rash involving mucus membranes or skin necrosis: No Has patient had a PCN reaction that required hospitalization: No Has patient had a PCN reaction occurring within the last 10 years: No If all of the above answers are "NO", then may proceed with Cephalosporin use.     Patient Measurements: Weight: 65.2 kg (143 lb 11.8 oz) Heparin Dosing Weight: pending  Vital Signs: Temp: 97.5 F (36.4 C) (05/13 1209) Temp Source: Oral (05/13 1209) BP: 104/73 (05/13 1209) Pulse Rate: 76 (05/13 1209)  Labs: Recent Labs    08/12/20 1943 08/12/20 2115 08/13/20 0011 08/13/20 1027  HGB 14.3  --  14.3  --   HCT 42.6  --  43.6  --   PLT 297  --  318  --   HEPARINUNFRC  --   --  >1.10* 0.44  CREATININE 1.48*  --  1.49*  --   TROPONINIHS 197* 164* 151*  --     CrCl cannot be calculated (Unknown ideal weight.).   Medical History: Past Medical History:  Diagnosis Date  . Anxiety   . Arthritis   . CHF (congestive heart failure) (HCC)   . Chronic back pain   . Chronic pain   . COPD (chronic obstructive pulmonary disease) (HCC)   . Coronary artery disease   . Depression   . GERD (gastroesophageal reflux disease)   . Hypertension   . Migraine   . Stroke [facility])     Assessment: 31 yoF transferred from OSH on IV heparin for ACS r/o. No AC PTA.  Plans are for cath on Monday -heparin level at goal  -CBC stable  Goal of Therapy:  Heparin level 0.3-0.7 units/ml Monitor platelets by anticoagulation protocol: Yes   Plan:  Continue heparin 900 units/h Recheck heparin level later today Daily heparin level and CBC  Harland German, PharmD Clinical Pharmacist **Pharmacist  phone directory can now be found on [email or web address] ([email or web address]).  Listed under [facility].

## 2020-08-14 DIAGNOSIS — I5021 Acute systolic (congestive) heart failure: Secondary | ICD-10-CM | POA: Diagnosis not present

## 2020-08-14 DIAGNOSIS — E871 Hypo-osmolality and hyponatremia: Secondary | ICD-10-CM

## 2020-08-14 DIAGNOSIS — E785 Hyperlipidemia, unspecified: Secondary | ICD-10-CM

## 2020-08-14 DIAGNOSIS — I10 Essential (primary) hypertension: Secondary | ICD-10-CM | POA: Diagnosis not present

## 2020-08-14 DIAGNOSIS — R778 Other specified abnormalities of plasma proteins: Secondary | ICD-10-CM | POA: Diagnosis not present

## 2020-08-14 DIAGNOSIS — E876 Hypokalemia: Secondary | ICD-10-CM

## 2020-08-14 LAB — HEPARIN LEVEL (UNFRACTIONATED)
Heparin Unfractionated: 0.25 IU/mL — ABNORMAL LOW (ref 0.30–0.70)
Heparin Unfractionated: 0.29 IU/mL — ABNORMAL LOW (ref 0.30–0.70)

## 2020-08-14 LAB — MAGNESIUM: Magnesium: 1.9 mg/dL (ref 1.7–2.4)

## 2020-08-14 LAB — CBC
HCT: 42.1 % (ref 36.0–46.0)
Hemoglobin: 13.6 g/dL (ref 12.0–15.0)
MCH: 28.1 pg (ref 26.0–34.0)
MCHC: 32.3 g/dL (ref 30.0–36.0)
MCV: 87 fL (ref 80.0–100.0)
Platelets: 226 10*3/uL (ref 150–400)
RBC: 4.84 MIL/uL (ref 3.87–5.11)
RDW: 14.4 % (ref 11.5–15.5)
WBC: 10.6 10*3/uL — ABNORMAL HIGH (ref 4.0–10.5)
nRBC: 0 % (ref 0.0–0.2)

## 2020-08-14 LAB — BASIC METABOLIC PANEL
Anion gap: 10 (ref 5–15)
BUN: 56 mg/dL — ABNORMAL HIGH (ref 8–23)
CO2: 28 mmol/L (ref 22–32)
Calcium: 8.4 mg/dL — ABNORMAL LOW (ref 8.9–10.3)
Chloride: 93 mmol/L — ABNORMAL LOW (ref 98–111)
Creatinine, Ser: 1.59 mg/dL — ABNORMAL HIGH (ref 0.44–1.00)
GFR, Estimated: 35 mL/min — ABNORMAL LOW (ref >60)
Glucose, Bld: 91 mg/dL (ref 70–99)
Potassium: 3.3 mmol/L — ABNORMAL LOW (ref 3.5–5.1)
Sodium: 131 mmol/L — ABNORMAL LOW (ref 135–145)

## 2020-08-14 LAB — TSH: TSH: 1.358 u[IU]/mL (ref 0.350–4.500)

## 2020-08-14 LAB — GLUCOSE, CAPILLARY
Glucose-Capillary: 121 mg/dL — ABNORMAL HIGH (ref 70–99)
Glucose-Capillary: 70 mg/dL (ref 70–99)
Glucose-Capillary: 113 mg/dL — ABNORMAL HIGH (ref 70–99)
Glucose-Capillary: 136 mg/dL — ABNORMAL HIGH (ref 70–99)

## 2020-08-14 LAB — UREA NITROGEN, URINE: Urea Nitrogen, Ur: 622 mg/dL

## 2020-08-14 MED ORDER — POTASSIUM CHLORIDE CRYS ER 20 MEQ PO TBCR
30.0000 meq | EXTENDED_RELEASE_TABLET | ORAL | Status: AC
  Administered 2020-08-14 (×2): 30 meq via ORAL
  Filled 2020-08-14 (×2): qty 1

## 2020-08-14 NOTE — Evaluation (Signed)
Physical Therapy Evaluation Patient Details Name: Mariah White MRN: [id number] DOB: 03-04-1951 Today's Date: 08/14/2020   History of Present Illness  70 y.o. female presents to [facility] hospital on 08/12/2020 as a transfer from [place] with reports of chest and L hip pain after fall. Head CT without contrast did not note acute abnormalities. X-rays of her pelvis showed no acute fractures. EKG findings to have concern for Q waves. High-sensitivity troponins noted to be elevated. PMH includes HTN, diastolic CHF, CAD, prior CVA, and COPD on 2 L of oxygen 24/7. Pt newly diagnosed with acute  systolic CHF.  Clinical Impression  Pt reports indicate she is hallucinating during session, with statements like "the chimney is on fire" and "the kitten's head is sticking out". Pt demonstrates ability to perform supine>sit transfers without the need for physical assistance, but requires physical assistance to perform sit>supine. Pt is able to perform sit>stand transfers and ambulation of household distances with RW and without physical assistance,  but is limited due to fatigue. Pt demonstrates deficits in strength, endurance, power, activity tolerance, cognition and will benefit from acute PT to improve safety. Pt's cognitive deficits, history of falls, and strength deficits make her a falls risk. Pt will benefit from HHPT to improve safety and independence in mobility.     Follow Up Recommendations Home health PT;Supervision/Assistance - 24 hour    Equipment Recommendations  None recommended by PT    Recommendations for Other Services       Precautions / Restrictions Precautions Precautions: Fall Precaution Comments: hallucinations Restrictions Weight Bearing Restrictions: No      Mobility  Bed Mobility Overal bed mobility: Modified Independent;Needs Assistance Bed Mobility: Supine to Sit;Sit to Supine     Supine to sit: Min guard;HOB elevated (VCs to scoot hips forward.) Sit to supine: Min  assist        Transfers Overall transfer level: Needs assistance Equipment used: Rolling walker (2 wheeled) Transfers: Sit to/from Stand Sit to Stand: Min guard         General transfer comment: min G for safety, VCs for hand placement to perform sit>stand transfer.  Ambulation/Gait Ambulation/Gait assistance: Min guard Gait Distance (Feet): 20 Feet Assistive device: Rolling walker (2 wheeled) Gait Pattern/deviations: Step-through pattern;Narrow base of support;Trunk flexed;Decreased stride length Gait velocity: decreased Gait velocity interpretation: <1.8 ft/sec, indicate of risk for recurrent falls General Gait Details: Pt demonstrates L knee valgus during gait and narrow base of support.  Stairs            Wheelchair Mobility    Modified Rankin (Stroke Patients Only)       Balance Overall balance assessment: Needs assistance Sitting-balance support: Feet supported;No upper extremity supported Sitting balance-Leahy Scale: Fair     Standing balance support: During functional activity;Bilateral upper extremity supported;Single extremity supported Standing balance-Leahy Scale: Poor Standing balance comment: at least single UE support for static standing and during gait.                             Pertinent Vitals/Pain Pain Assessment: No/denies pain    Home Living Family/patient expects to be discharged to:: Private residence Living Arrangements: Children (son and his girlfriend) Available Help at Discharge: Family;Available 24 hours/day Type of Home: House Home Access: Stairs to enter Entrance Stairs-Rails: Left (Pt reports to PT L hand rail; reports to OT no hand rail.) Entrance Stairs-Number of Steps: 3 Home Layout: One level Home Equipment: Walker - 2 wheels;Cane -  single point;Wheelchair - manual;Toilet riser      Prior Function Level of Independence: Independent (Pt reports to have been independent prior to hospitalization. Pt may be  a poor historian, conflicting reports between therapists and previous admission.)               Hand Dominance        Extremity/Trunk Assessment   Upper Extremity Assessment Upper Extremity Assessment: Defer to OT evaluation    Lower Extremity Assessment Lower Extremity Assessment: Generalized weakness    Cervical / Trunk Assessment Cervical / Trunk Assessment: Kyphotic  Communication   Communication: No difficulties  Cognition Arousal/Alertness: Awake/alert Behavior During Therapy: Flat affect Overall Cognitive Status: No family/caregiver present to determine baseline cognitive functioning Area of Impairment: Safety/judgement;Awareness;Problem solving;Following commands;Attention;Orientation                 Orientation Level: Situation;Disoriented to Current Attention Level: Sustained   Following Commands: Follows one step commands with increased time;Follows one step commands consistently Safety/Judgement: Decreased awareness of safety;Decreased awareness of deficits Awareness: Intellectual Problem Solving: Slow processing;Decreased initiation;Difficulty sequencing;Requires verbal cues;Requires tactile cues General Comments: When asked if PT okay to work with pt, pt states that it is not a good time because the chimney is on fire. Pt later reports that there is a kitten's head sticking out of the wall.      General Comments General comments (skin integrity, edema, etc.): Pt 3 L [place] on PT arrival with oxygen sat levels 100%. Pt weaned to 2 L, maintaining oxygen sat levels in mid 90s.    Exercises     Assessment/Plan    PT Assessment Patient needs continued PT services  PT Problem List Decreased strength;Decreased activity tolerance;Decreased balance;Decreased mobility;Decreased cognition;Decreased knowledge of use of DME;Decreased safety awareness;Decreased knowledge of precautions       PT Treatment Interventions DME instruction;Gait training;Functional  mobility training;Therapeutic activities;Therapeutic exercise;Stair training;Balance training;Patient/family education    PT Goals (Current goals can be found in the Care Plan section)  Acute Rehab PT Goals Patient Stated Goal: To sit at EOB PT Goal Formulation: With patient Time For Goal Achievement: 08/28/20 Potential to Achieve Goals: Good    Frequency Min 3X/week   Barriers to discharge        Co-evaluation               AM-PAC PT "6 Clicks" Mobility  Outcome Measure Help needed turning from your back to your side while in a flat bed without using bedrails?: A Little Help needed moving from lying on your back to sitting on the side of a flat bed without using bedrails?: A Little Help needed moving to and from a bed to a chair (including a wheelchair)?: A Little Help needed standing up from a chair using your arms (e.g., wheelchair or bedside chair)?: A Little Help needed to walk in hospital room?: A Little Help needed climbing 3-5 steps with a railing? : A Lot 6 Click Score: 17    End of Session Equipment Utilized During Treatment: Gait belt;Oxygen Activity Tolerance: Patient limited by fatigue Patient left: in bed;with call bell/phone within reach;with bed alarm set Nurse Communication: Mobility status PT Visit Diagnosis: Other abnormalities of gait and mobility (R26.89);Muscle weakness (generalized) (M62.81);Difficulty in walking, not elsewhere classified (R26.2);History of falling (Z91.81)    Time: 4166-0630 PT Time Calculation (min) (ACUTE ONLY): 31 min   Charges:   PT Evaluation $PT Eval Low Complexity: 1 Low          Acute  Rehab  Pager: [phone]   Waldemar Dickens, SPT  08/14/2020, 5:31 PM

## 2020-08-14 NOTE — Progress Notes (Signed)
PROGRESS NOTE    Mariah White  HWE:[id number] DOB: 02-10-1951 DOA: 08/12/2020 PCP: [facility], [facility]    Brief Narrative:  Mariah White is a 70 year old female with past medical history significant for essential hypertension, chronic diastolic congestive heart failure, CAD, prior CVA, COPD on 2 L nasal cannula oxygen at baseline presents as a transfer from [facility] for chest pain.  Patient had been hospitalized at [facility] from 5/6-08/2010; after initially presenting following fall at home with complaints of left hip pain.  She reports at that time she was reaching for cigarettes while in bed and fell without loss of consciousness.  CT scan of her head without contrast did not show any acute intracranial abnormalities and x-rays pelvis with no acute fractures.  While in the ED at [facility] ER, patient reported to complain of some dysuria and was noted to be with some mild hypertension.  Hypotension resolved with IV fluids and she was started on IV antibiotics for urinary tract infection with ceftriaxone.  During her hospital stay, patient has been noted to be intermittently confused at times with plan to transfer patient to a rehab facility due to ongoing weakness.  Patient then started to complain of chest pain 2-3 days prior to transfer with localization in the middle of her chest with pressure-like feeling.  Also associated with shortness of breath with exertion, nausea/vomiting and diaphoresis. Chest pain was reportedly brought to the provider's attention the day prior to transfer and EKG was found to have concern for Q waves.  High-sensitivity troponins were noted to be elevated 325->369.  Echocardiogram was obtained which showed EF of 30 to 35%.  Patient has been started on heparin IV as well as evaluated by cardiology who recommended cardiac cath.  Case was discussed with Dr. Wyline Mood of cardiology here at [facility] as well as Dr. Ophelia Charter of the [facility].  Patient  accepted to a cardiac telemetry bed as inpatient.    Assessment & Plan:   Principal Problem:   Elevated troponin Active Problems:   COPD (chronic obstructive pulmonary disease) (HCC)   CAD (coronary artery disease)   Essential hypertension   Pressure injury of skin   Acute systolic (congestive) heart failure (HCC)   Leukocytosis   NSTEMI (non-ST elevated myocardial infarction) (HCC)   Acute metabolic encephalopathy   Hyponatremia   Hyperlipidemia with target LDL less than 70   NSTEMI Systolic congestive heart failure, acute, new diagnosis Patient presenting from [facility] with chest pain.  TTE ED at outside hospital notable for EF 30-35%.  BNP 1089.  On arrival, high-sensitivity troponin elevated 197>164>151.  EKG with NSR, rate 76, QTc 499, T wave inversions in 1/aVL, lead II, V4-V6; there is change in comparison to EKG dated 05/04/2017 in lead II, V4-V6, otherwise unchanged. --Cardiology following, appreciate assistance --Cardiology holding IV diuresis today for bump in creatinine and anticipated [facility] Monday --Heparin drip --Metoprolol tartrate 12.5 mg p.o. twice daily --Holding ACE inhibitor/ARB/ARNI given renal function and borderline hypotension --Strict I's and O's and daily weights --Cardiology planning left heart catheterization 5/16 --Continue monitor on telemetry  Chronic respiratory failure with hypoxia, 2L [place] baseline COPD --Continue supplemental oxygen, maintain SPO2 > 88%, currently on 2L [employer] which is her home dose --DuoNebs as needed  Hyponatremia On transfer, sodium noted to be 124.  Suspect volume overload in the setting of decompensated congestive heart failure.  Urine sodium 73, urine osmolality 430, serum osmolality 289.  Stage I 0.358 --Na 124>126>131 --Holding furosemide  as above for bump in creatinine. -- BMP in the a.m.  Hypokalemia Potassium 3.3 today, will replete. --Repeat electrolytes in a.m. to include magnesium  Recent UTI Patient was treated  with IV Rocephin during her most recent hospitalization.  Leukocytosis now resolved.  Type 2 diabetes mellitus Hemoglobin A1c 6.0, well controlled.  On Ozempic and Januvia 25 mg p.o. daily outpatient. --SSI for coverage --CBGs QAC/HS  CKD stage IIIb Baseline creatinine 1.5-1.6.  Creatinine on admission to [facility] 2.10. --Cr 1.48>1.49>1.59 --Cardiology holding IV furosemide as above --BMP daily  Hyperlipidemia HDL 53, LDL 128. --Continue Crestor 20 mg p.o. daily  CAD  --Aspirin 81 mg p.o. daily, Plavix 75 mg p.o. daily, statin  Depression/anxiety --Abilify 5 mg p.o. nightly --Duloxetine 60 mg p.o. daily --Fluoxetine 40 mg p.o. daily  Tobacco use disorder Continues to endorse cigarette use.  Counseled on need for complete cessation. --Nicotine patch  Generalized weakness Records on transfer report patient has been having difficulty walking at baseline with multiple falls.  During her hospitalization at [facility] ER, patient was noted to have continued weakness for which rehab had initially been recommended. --PT/OT evaluation --TOC consult for possible need of placement  DVT prophylaxis: Heparin drip   Code Status: Full Code Family Communication: No family present at bedside this morning  Disposition Plan:  Level of care: Telemetry Cardiac Status is: Inpatient  Remains inpatient appropriate because:Persistent severe electrolyte disturbances, Ongoing diagnostic testing needed not appropriate for outpatient work up, Unsafe d/c plan, IV treatments appropriate due to intensity of illness or inability to take PO and Inpatient level of care appropriate due to severity of illness   Dispo: The patient is from: Home              Anticipated d/c is to: SNF              Patient currently is not medically stable to d/c.   Difficult to place patient No   Consultants:   Cardiology  Procedures:   None  Antimicrobials:   Ceftriaxone   Subjective: Patient seen and  examined at bedside, resting comfortably.  Sitting in bedside chair eating breakfast.  Worked with occupational therapy this morning.  OT reports patient with mild confusion/hallucination; now appears resolved.  Dyspnea continues to improve.  Denies chest pain.  Seen by cardiology this morning with plans to hold further IV diuresis given bump in creatinine with plans for left heart catheterization on Monday.  No other complaints at this time.  Denies headache, no dizziness, no chest pain, no palpitations, no fever/chills/night sweats, no nausea/vomiting/diarrhea, no abdominal pain, no cough/congestion.  No other acute events overnight per nursing staff.  Objective: Vitals:   08/13/20 2104 08/14/20 0001 08/14/20 0508 08/14/20 0800  BP: (!) 97/59 95/60 104/62 98/73  Pulse: 73 69 72   Resp:  18 15   Temp:  97.6 F (36.4 C) 98 F (36.7 C)   TempSrc:  Axillary Axillary   SpO2:  95% 100%   Weight:   67.2 kg     Intake/Output Summary (Last 24 hours) at 08/14/2020 0928 Last data filed at 08/14/2020 0500 Gross per 24 hour  Intake 440.38 ml  Output 1150 ml  Net -709.62 ml   Filed Weights   08/13/20 0413 08/14/20 0508  Weight: 65.2 kg 67.2 kg    Examination:  General exam: Appears calm and comfortable Respiratory system: Slightly decreased breath sounds bilateral bases, otherwise clear to auscultation bilaterally, no wheezing/crackles, normal [place] effort, on 2 L  [place] (2L at home) Cardiovascular system: S1 & S2 heard, RRR. No JVD, murmurs, rubs, gallops or clicks. No pedal edema. Gastrointestinal system: Abdomen is nondistended, soft and nontender. No organomegaly or masses felt. Normal bowel sounds heard. Central nervous system: Alert and oriented to person/place/time/situation. No focal neurological deficits. Extremities: Symmetric 5 x 5 power. Skin: No rashes, lesions or ulcers Psychiatry: Judgement and insight appear poor. Mood & affect appropriate.     Data Reviewed: I have  personally reviewed following labs and imaging studies  CBC: Recent Labs  Lab 08/12/20 1943 08/13/20 0011 08/14/20 0340  WBC 11.1* 11.3* 10.6*  HGB 14.3 14.3 13.6  HCT 42.6 43.6 42.1  MCV 85.5 85.8 87.0  PLT 297 318 226   Basic Metabolic Panel: Recent Labs  Lab 08/12/20 1943 08/13/20 0011 08/14/20 0340  NA 124* 126* 131*  K 3.9 3.8 3.3*  CL 88* 90* 93*  CO2 25 27 28   GLUCOSE 150* 149* 91  BUN 46* 48* 56*  CREATININE 1.48* 1.49* 1.59*  CALCIUM 8.4* 8.5* 8.4*  MG  --   --  1.9   GFR: CrCl cannot be calculated (Unknown ideal weight.). Liver Function Tests: Recent Labs  Lab 08/12/20 1943  AST 31  ALT 24  ALKPHOS 93  BILITOT 0.9  PROT 7.1  ALBUMIN 2.8*   No results for input(s): LIPASE, AMYLASE in the last 168 hours. No results for input(s): AMMONIA in the last 168 hours. Coagulation Profile: No results for input(s): INR, PROTIME in the last 168 hours. Cardiac Enzymes: No results for input(s): CKTOTAL, CKMB, CKMBINDEX, TROPONINI in the last 168 hours. BNP (last 3 results) No results for input(s): PROBNP in the last 8760 hours. HbA1C: Recent Labs    08/12/20 2115  HGBA1C 6.0*   CBG: Recent Labs  Lab 08/13/20 0802 08/13/20 1208 08/13/20 1622 08/13/20 2135 08/14/20 0809  GLUCAP 108* 116* 129* 109* 121*   Lipid Profile: Recent Labs    08/13/20 0011  CHOL 205*  HDL 53  LDLCALC 128*  TRIG 118  CHOLHDL 3.9   Thyroid Function Tests: Recent Labs    08/14/20 0340  TSH 1.358   Anemia Panel: No results for input(s): VITAMINB12, FOLATE, FERRITIN, TIBC, IRON, RETICCTPCT in the last 72 hours. Sepsis Labs: No results for input(s): PROCALCITON, LATICACIDVEN in the last 168 hours.  No results found for this or any previous visit (from the past 240 hour(s)).       Radiology Studies: No results found.      Scheduled Meds: . ARIPiprazole  5 mg Oral QHS  . aspirin EC  81 mg Oral Daily  . clopidogrel  75 mg Oral Daily  . DULoxetine  60 mg  Oral Daily  . FLUoxetine  40 mg Oral Daily  . insulin aspart  0-9 Units Subcutaneous TID WC  . LORazepam  1 mg Intravenous Once  . metoprolol tartrate  12.5 mg Oral BID  . nicotine  14 mg Transdermal Daily  . potassium chloride  30 mEq Oral Q3H  . rosuvastatin  20 mg Oral Daily   Continuous Infusions: . heparin 750 Units/hr (08/14/20 0502)     LOS: 2 days    Time spent: 38 minutes spent on chart review, discussion with nursing staff, consultants, updating family and interview/physical exam; more than 50% of that time was spent in counseling and/or coordination of care.    Alvira Philips [place], DO [facility] Available via Epic secure chat 7am-7pm After these hours, please refer to coverage provider  listed on [email or web address] 08/14/2020, 9:28 AM

## 2020-08-14 NOTE — Progress Notes (Addendum)
ANTICOAGULATION CONSULT NOTE  Pharmacy Consult for IV Heparin Indication: chest pain/ACS  Allergies  Allergen Reactions  . Penicillins Itching    Has patient had a PCN reaction causing immediate rash, facial/tongue/throat swelling, SOB or lightheadedness with hypotension: Yes Has patient had a PCN reaction causing severe rash involving mucus membranes or skin necrosis: No Has patient had a PCN reaction that required hospitalization: No Has patient had a PCN reaction occurring within the last 10 years: No If all of the above answers are "NO", then may proceed with Cephalosporin use.     Patient Measurements: Weight: 67.2 kg (148 lb 3.2 oz) Heparin Dosing Weight: 61.4 kg (based on ht from 11/21)  Vital Signs: Temp: 98 F (36.7 C) (05/14 0508) Temp Source: Axillary (05/14 0508) BP: 98/73 (05/14 0800) Pulse Rate: 72 (05/14 0508)  Labs: Recent Labs    08/12/20 1943 08/12/20 1943 08/12/20 2115 08/13/20 0011 08/13/20 1027 08/13/20 1617 08/14/20 0340  HGB 14.3  --   --  14.3  --   --  13.6  HCT 42.6  --   --  43.6  --   --  42.1  PLT 297  --   --  318  --   --  226  HEPARINUNFRC  --    < >  --  >1.10* 0.44 0.35 0.25*  CREATININE 1.48*  --   --  1.49*  --   --  1.59*  TROPONINIHS 197*  --  164* 151*  --   --   --    < > = values in this interval not displayed.    CrCl cannot be calculated (Unknown ideal weight.).   Assessment: 70 yr old female transferred from OSH on IV heparin for R/O ACS. Pt was on no anticoagulant PTA to OSH. Cardiac cath planned for Monday. Plans are for cath on Monday  Heparin level is borderline therapeutic at 0.29 after rate increased from 600 to 750 units/hr. CBC stable, no s/sx bleeding reported.   Goal of Therapy:  Heparin level 0.3-0.7 units/ml Monitor platelets by anticoagulation protocol: Yes   Plan:  Increase heparin infusion to 850 units/hr Heparin level in 8 hours Daily heparin level, CBC  Laverna Peace, PharmD PGY-1 Pharmacy  Resident 08/14/2020 3:27 PM Please see AMION for all pharmacy numbers

## 2020-08-14 NOTE — Progress Notes (Signed)
Progress Note  Patient Name: Mariah White Date of Encounter: 08/14/2020  [facility] Cardiologist: Dina Rich, MD   Subjective   Currently on 2L of O2 which is her home does with O2 sats 100%.  SOB improved.  Denies any chest pain  Inpatient Medications    Scheduled Meds: . ARIPiprazole  5 mg Oral QHS  . aspirin EC  81 mg Oral Daily  . clopidogrel  75 mg Oral Daily  . DULoxetine  60 mg Oral Daily  . FLUoxetine  40 mg Oral Daily  . furosemide  40 mg Intravenous BID  . insulin aspart  0-9 Units Subcutaneous TID WC  . LORazepam  1 mg Intravenous Once  . metoprolol tartrate  12.5 mg Oral BID  . nicotine  14 mg Transdermal Daily  . potassium chloride  30 mEq Oral Q3H  . rosuvastatin  20 mg Oral Daily   Continuous Infusions: . heparin 750 Units/hr (08/14/20 0502)   PRN Meds: acetaminophen, ALPRAZolam, ipratropium-albuterol, ondansetron (ZOFRAN) IV, oxyCODONE   Vital Signs    Vitals:   08/13/20 2104 08/14/20 0001 08/14/20 0508 08/14/20 0800  BP: (!) 97/59 95/60 104/62 98/73  Pulse: 73 69 72   Resp:  18 15   Temp:  97.6 F (36.4 C) 98 F (36.7 C)   TempSrc:  Axillary Axillary   SpO2:  95% 100%   Weight:   67.2 kg     Intake/Output Summary (Last 24 hours) at 08/14/2020 0838 Last data filed at 08/14/2020 0500 Gross per 24 hour  Intake 440.38 ml  Output 1150 ml  Net -709.62 ml   Last 3 Weights 08/14/2020 08/13/2020 02/13/2020  Weight (lbs) 148 lb 3.2 oz 143 lb 11.8 oz 162 lb  Weight (kg) 67.223 kg 65.2 kg 73.483 kg  Some encounter information is confidential and restricted. Go to Review Flowsheets activity to see all data.      Telemetry    NSR. - Personally Reviewed  ECG  No new EKG to review - Personally Reviewed  Physical Exam   GEN: Well nourished, well developed in no acute distress HEENT: Normal NECK: No JVD; No carotid bruits LYMPHATICS: No lymphadenopathy CARDIAC:RRR, no murmurs, rubs, gallops RESPIRATORY:  Clear to auscultation without  rales, wheezing or rhonchi  ABDOMEN: Soft, non-tender, non-distended MUSCULOSKELETAL:  1+ BLE edema; No deformity  SKIN: Warm and dry NEUROLOGIC:  Alert and oriented x 3 PSYCHIATRIC:  Normal affect    Labs    High Sensitivity Troponin:   Recent Labs  Lab 08/12/20 1943 08/12/20 2115 08/13/20 0011  TROPONINIHS 197* 164* 151*      Chemistry Recent Labs  Lab 08/12/20 1943 08/13/20 0011 08/14/20 0340  NA 124* 126* 131*  K 3.9 3.8 3.3*  CL 88* 90* 93*  CO2 25 27 28   GLUCOSE 150* 149* 91  BUN 46* 48* 56*  CREATININE 1.48* 1.49* 1.59*  CALCIUM 8.4* 8.5* 8.4*  PROT 7.1  --   --   ALBUMIN 2.8*  --   --   AST 31  --   --   ALT 24  --   --   ALKPHOS 93  --   --   BILITOT 0.9  --   --   GFRNONAA 38* 38* 35*  ANIONGAP 11 9 10      Hematology Recent Labs  Lab 08/12/20 1943 08/13/20 0011 08/14/20 0340  WBC 11.1* 11.3* 10.6*  RBC 4.98 5.08 4.84  HGB 14.3 14.3 13.6  HCT 42.6 43.6 42.1  MCV 85.5 85.8 87.0  MCH 28.7 28.1 28.1  MCHC 33.6 32.8 32.3  RDW 14.4 14.3 14.4  PLT 297 318 226    BNP Recent Labs  Lab 08/13/20 0011  BNP 1,089.8*     DDimer No results for input(s): DDIMER in the last 168 hours.   Radiology    No results found.  Cardiac Studies   Echocardiogram 08/06/2020 [facility]): - LV mildly dilated with mildly to moderately increased wall thickness. LVEF 30-35%. Atypical septal motion. Hypokinesis of anterolateral wall and mid inferolateral wall. LV diastolic function is indeterminate.  - RV normal in sizes. Normal systolic function.  - Left atrium mildltly to moderately dilated.  - Right atrium normal in size.  - No significant valvular disease.  Patient Profile     70 y.o. female with a history of chronic diastolic CHF with EF >75% on last Echo in 2018, prior stroke on Aspirin and Plavix, COPD, hypertension and CKD stage III. She was admitted to [facility] on 08/06/2020 with UTI and altered mental status. She subsequently developed chest  pain and found to have a newly reduced LVEF down to 30-35%. Decision was made to transfer patient to [facility] for cardiac catheterization.  Assessment & Plan    New Onset Acute Systolic CHF - BNP 1,089. - Echo at [facility] showed LVEF of 30-35% with atypical septal motion and hypokinesis of anterolateral wall and mid inferolateral wall. EF down from 75% in 2018.  - Currently on IV Lasix 40mg  twice daily.  - she put out 1.15L yesterday and is net neg 1.15L since admit - SCr slightly bumped from 1.49 to 1.59 today - her lungs are clear but does still have some mild LE edema - will order compression hose - will hold lasix today as her renal function has bumped and need to do cath on Monday - ACE/ARB/ARNI are on hold for now given renal function. - continue Lopressor 12.5mg  BID and transition to Toprol at discharge - Continue to monitor daily weights, strict I/O's, and renal function. - replete K+ to keep > 4 (K+ 3.3 this am)>>per TRH - Shared Decision Making/Informed Consent The risks [stroke (1 in 1000), death (1 in 1000), kidney failure [usually temporary] (1 in 500), bleeding (1 in 200), allergic reaction [possibly serious] (1 in 200)], benefits (diagnostic support and management of coronary artery disease) and alternatives of a cardiac catheterization were discussed in detail with Mariah White and she is willing to proceed.  Demand Ischemia vs NSTEMI - High-sensitivity troponin peaked at 369 at [facility]. Trending down here 197 >> 164 >>151. - Echo as above. - she denies any chest pain - Continue IV Heparin for now. - Continue Aspirin, Plavix 75mg  daily, lopressor 12.5mg  BID, IV Heparin gtt and Crestor - plan for LHC on Monday  Possible CAD - History of CAD listed in chart but not mentioned in outpatient Cardiology notes. Apparently  had a cardiac cath before a long time ago and states she had a stent placed. However, she is still confused and not fully oriented so unsure if  this is accurate. - Continue Aspirin, Crestor 20mg  daily and Lopressor 12.5mg  BID - Plan is for cath as above.  Hypertension - BP soft today at 98/8mmHg - continue low dose BB - home amlodipine has been stopped - BP and renal function will not tolerate ARB at this time  Hyperlipidemia - Lipid panel this admission: Total Cholesterol 205, Triglycerides 118, HDL 53, LDL 128.  - LDL  goal <70 given history of stroke and CAD. - started on Crestor 20mg  daily - will need outpt FLp and ALT in 6 weeks.  CKD Stage III - Creatinine 2.10 on admission to [facility]. Improved to 1.49 yesterday and slightly increased to 1.59 this am. - Baseline creatinine 1.5 to 1.6 in 06/2020. - Continue to monitor closely with diuresis.  History of Stroke - On Aspirin and Plavix. - started on statin  Hyponatremia - Sodium dropped as low as 124 on 5/12 and now improved to 131 - Possibly due to hypervolemia.  - Management per primary team.  Hypokalemia -secondary to diuresis -K+ 3.3 this am -will give Kdur 7/12 this am>>ordered by [facility] -repeat BMET in am  Otherwise, per primary team: - UTI - Metabolic encephalopathy - COPD with chronic respiratory failure on 2L of O2 at home - Type 2 diabetes - Generalized weakness - Tobacco abuse  I have spent a total of 30 minutes with patient reviewing risks and benefits of cardiac cath , telemetry, EKGs, labs and examining patient as well as establishing an assessment and plan that was discussed with the patient.  > 50% of time was spent in direct patient care.     For questions or updates, please contact [facility] Please consult [email or web address] for contact info under        Signed, [facility], MD  08/14/2020, 8:38 AM     Patient seen and examined. Agree with assessment and plan.  No chest pain presently.  Admits to confusion earlier this morning.  Troponins mildly elevated but declining, not consistent with STEMI and most likely contributed by  demand ischemia secondary to CHF.  BNP today elevated at 1089.  Echo Doppler study at [facility] showed EF 30 to 35% with hypokinesis anterolaterally and mid inferolateral he.  Mild to moderate LA dilation.  Resting pulse is in the 80s.  Blood pressure 130/86.  JVD.  She is kyphotic.  There is an area of ecchymosis in the right upper back from a prior fall last week.  There are decreased breath sounds at bases with faint basilar rales.  Is regular with rates in the 60s to 70s.  Abdomen is soft and nontender.  There is no significant edema.  Serum sodium remains low, was 124, today 126.  She is not on any thiazide diuretic.  We will make certain thyroid function has been checked.  May be contributed by CHF, if sodium remains low with diuresis, consider SIADH evaluation.  Creatinine today 1.49.  Plan to stabilize over the weekend.  Will initiate low-dose beta-blocker and statin.  Will need definitive cardiac catheterization and will tentatively set up for Monday   Sunday, MD, [facility] 08/14/2020 8:38 AM

## 2020-08-14 NOTE — Plan of Care (Signed)
?  Problem: Health Behavior/Discharge Planning: ?Goal: Ability to manage health-related needs will improve ?Outcome: Progressing ?  ?Problem: Clinical Measurements: ?Goal: Will remain free from infection ?Outcome: Progressing ?Goal: Respiratory complications will improve ?Outcome: Progressing ?  ?

## 2020-08-14 NOTE — Progress Notes (Signed)
ANTICOAGULATION CONSULT NOTE  Pharmacy Consult for IV Heparin Indication: chest pain/ACS  Allergies  Allergen Reactions  . Penicillins Itching    Has patient had a PCN reaction causing immediate rash, facial/tongue/throat swelling, SOB or lightheadedness with hypotension: Yes Has patient had a PCN reaction causing severe rash involving mucus membranes or skin necrosis: No Has patient had a PCN reaction that required hospitalization: No Has patient had a PCN reaction occurring within the last 10 years: No If all of the above answers are "NO", then may proceed with Cephalosporin use.     Patient Measurements: Weight: 65.2 kg (143 lb 11.8 oz) Heparin Dosing Weight: 61.4 kg (based on ht from 11/21)  Vital Signs: Temp: 97.6 F (36.4 C) (05/14 0001) Temp Source: Axillary (05/14 0001) BP: 95/60 (05/14 0001) Pulse Rate: 69 (05/14 0001)  Labs: Recent Labs    08/12/20 1943 08/12/20 1943 08/12/20 2115 08/13/20 0011 08/13/20 1027 08/13/20 1617 08/14/20 0340  HGB 14.3  --   --  14.3  --   --  13.6  HCT 42.6  --   --  43.6  --   --  42.1  PLT 297  --   --  318  --   --  226  HEPARINUNFRC  --    < >  --  >1.10* 0.44 0.35 0.25*  CREATININE 1.48*  --   --  1.49*  --   --   --   TROPONINIHS 197*  --  164* 151*  --   --   --    < > = values in this interval not displayed.    CrCl cannot be calculated (Unknown ideal weight.).   Assessment: 70 yr old female transferred from OSH on IV heparin for R/O ACS. Pt was on no anticoagulant PTA to OSH. Cardiac cath planned for Monday. Plans are for cath on Monday  Heparin level subtherapeutic (0.25) on gtt at 600 units/hr. No issues with line or bleeding reported per RN.  Goal of Therapy:  Heparin level 0.3-0.7 units/ml Monitor platelets by anticoagulation protocol: Yes   Plan:  Increase heparin infusion to 750 units/hr F/u 8hr heparin level  Christoper Fabian, PharmD, BCPS Please see amion for complete clinical pharmacist phone  list 08/14/2020 4:52 AM

## 2020-08-14 NOTE — Evaluation (Signed)
Occupational Therapy Evaluation Patient Details Name: Mariah White MRN: [id number] DOB: March 12, 1951 Today's Date: 08/14/2020    History of Present Illness 70 y.o. female presents to [facility] hospital on 08/12/2020 as a transfer from [place] with reports of chest and L hip pain after fall. Head CT without contrast did not note acute abnormalities. X-rays of her pelvis showed no acute fractures. EKG findings to have concern for Q waves. High-sensitivity troponins noted to be elevated. PMH includes HTN, diastolic CHF, CAD, prior CVA, and COPD on 2 L of oxygen 24/7. Pt newly diagnosed with acute  systolic CHF.   Clinical Impression   PTA, pt was living with her son and daughter-in-law and reports she performed BADLs and used RW for mobility; reports family performs all IADLs. Pt currently requiring Min A for ADLs and Min Guard-Min A for functional mobility with RW. Pt presenting with decreased balance, activity tolerance, and cognition. Unsure of baseline cognition, pt with decreased problem solving, awareness, and attention; also with hallucinations of person in room during session. BP soft during session; RN notified. Pt would benefit from further acute OT to facilitate safe dc. Pending pt's support at home, recommend dc to home with [facility] for further OT to optimize safety, independence with ADLs, and return to PLOF.   SpO2 92-91% on 3L during activity. HR 90-110s. BP 98/73 once seated in recliner after ADLs. BP 91/75 after seated for 5 minutes    Follow Up Recommendations  Home health OT;Supervision/Assistance - 24 hour (If does not have 24 hour support as reported; may need SNF)    Equipment Recommendations  None recommended by OT    Recommendations for Other Services PT consult     Precautions / Restrictions Precautions Precautions: Fall Precaution Comments: hallucinations Restrictions Weight Bearing Restrictions: No      Mobility Bed Mobility               General bed mobility  comments: At EOB upon arrival    Transfers Overall transfer level: Needs assistance   Transfers: Sit to/from Stand Sit to Stand: Min assist         General transfer comment: MIn A to initiate power up from EOB. MIn Guard A for safety from [facility]    Balance Overall balance assessment: Needs assistance Sitting-balance support: Feet supported;No upper extremity supported Sitting balance-Leahy Scale: Good     Standing balance support: Bilateral upper extremity supported Standing balance-Leahy Scale: Poor Standing balance comment: reliant on UE support                           ADL either performed or assessed with clinical judgement   ADL Overall ADL's : Needs assistance/impaired Eating/Feeding: Minimal assistance;Sitting Eating/Feeding Details (indicate cue type and reason): opening containers Grooming: Wash/dry face;Min guard;Standing Grooming Details (indicate cue type and reason): MIn GUard A for safety Upper Body Bathing: Minimal assistance;Sitting   Lower Body Bathing: Minimal assistance;Sit to/from stand   Upper Body Dressing : Set up;Supervision/safety;Sitting   Lower Body Dressing: Minimal assistance;Sit to/from stand Lower Body Dressing Details (indicate cue type and reason): Pt donning her socks while seated at EOB; bending forward and performing wiht MIn GUard A. Min A for standing balance Toilet Transfer: Minimal assistance;Min guard;Ambulation;BSC;RW (BSC over toilet) Toilet Transfer Details (indicate cue type and reason): MIn A to initate power up from EOB; MIn GUard A from [facility] Toileting- Clothing Manipulation and Hygiene: Supervision/safety;Sitting/lateral lean       Functional mobility  during ADLs: Minimal assistance;Rolling walker General ADL Comments: Pt presenting with decreased balance, cognition, strength, and activity tolerance.     Vision         Perception     Praxis      Pertinent Vitals/Pain Pain Assessment: No/denies pain      Hand Dominance Right   Extremity/Trunk Assessment Upper Extremity Assessment Upper Extremity Assessment: Overall WFL for tasks assessed   Lower Extremity Assessment Lower Extremity Assessment: Defer to PT evaluation   Cervical / Trunk Assessment Cervical / Trunk Assessment: Kyphotic   Communication Communication Communication: No difficulties   Cognition Arousal/Alertness: Awake/alert Behavior During Therapy: Flat affect (Very flat; mouth agape and staring past therapist. Moments of frustration) Overall Cognitive Status: No family/caregiver present to determine baseline cognitive functioning Area of Impairment: Attention;Memory;Following commands;Safety/judgement;Awareness;Problem solving                   Current Attention Level: Sustained Memory: Decreased short-term memory Following Commands: Follows one step commands with increased time;Follows multi-step commands inconsistently Safety/Judgement: Decreased awareness of safety;Decreased awareness of deficits Awareness: Intellectual Problem Solving: Slow processing;Decreased initiation;Difficulty sequencing;Requires verbal cues General Comments: Pt very flat; staring off and mouth wide open at rest. Answering simple one step questions. Requiring significant time throughout. Noting less verbal during mobility/standing. Once seated, pt more verbal - becoming slightly frustrated and irritated stating "you're an aggitating little creature arent you" when therapist managing lines. Once seated, pt also stating "what are you doing back there?" and then pt reporting a man that is standing behind the chair. Later pt stating "you'e doing alright slim" and with further questions, pt refering to  the man and saying "I just named him slim".   General Comments  SpO2 92-91% on 3L during activity. HR 90-110s. BP once seated in recliner after toileting and hand hygiene was 98/73 and then after 5 min seated 91/75; notified RN     Exercises     Shoulder Instructions      Home Living Family/patient expects to be discharged to:: Private residence Living Arrangements: Children (Son and daughter in Social worker) Available Help at Discharge: Family;Available 24 hours/day (Reports "my son is with my all day") Type of Home: House Home Access: Stairs to enter [employer] of Steps: 3 Entrance Stairs-Rails: None Home Layout: One level     Bathroom Shower/Tub: Producer, television/film/video: Standard (BSC over toilet)         Additional Comments: Poor historian      Prior Functioning/Environment Level of Independence: Needs assistance  Gait / Transfers Assistance Needed: Uses a RW for mobility ADL's / Homemaking Assistance Needed: Performs BADLs; reports son will assist with ADLs when she tired. Son and daughter in law performing all ADLs            OT Problem List: Decreased strength;Decreased range of motion;Decreased activity tolerance;Impaired balance (sitting and/or standing);Decreased safety awareness;Decreased knowledge of use of DME or AE;Decreased knowledge of precautions      OT Treatment/Interventions: Self-care/ADL training;Therapeutic exercise;Energy conservation;DME and/or AE instruction;Therapeutic activities;Patient/family education    OT Goals(Current goals can be found in the care plan section) Acute Rehab OT Goals Patient Stated Goal: Go to bathroom OT Goal Formulation: With patient Time For Goal Achievement: 08/28/20 Potential to Achieve Goals: Good  OT Frequency: Min 2X/week   Barriers to D/C:    Unsure of home support       Co-evaluation  AM-PAC OT "6 Clicks" Daily Activity     Outcome Measure Help from another person eating meals?: A Little Help from another person taking care of personal grooming?: A Little Help from another person toileting, which includes using toliet, bedpan, or urinal?: A Little Help from another person bathing (including  washing, rinsing, drying)?: A Little Help from another person to put on and taking off regular upper body clothing?: A Little Help from another person to put on and taking off regular lower body clothing?: A Little 6 Click Score: 18   End of Session Equipment Utilized During Treatment: Rolling walker;Gait belt;Oxygen Nurse Communication: Mobility status (BP; hallucinations)  Activity Tolerance: Patient tolerated treatment well Patient left: in chair;with call bell/phone within reach;with chair alarm set  OT Visit Diagnosis: Unsteadiness on feet (R26.81);Other abnormalities of gait and mobility (R26.89);Muscle weakness (generalized) (M62.81);History of falling (Z91.81)                Time: 1610-9604 OT Time Calculation (min): 34 min Charges:  OT General Charges $OT Visit: 1 Visit OT Evaluation $OT Eval Moderate Complexity: 1 Mod OT Treatments $Self Care/Home Management : 8-22 mins  Wren Gallaga MSOT, OTR/L Acute Rehab Pager: [phone] Office: [phone]  Theodoro Grist Cheyane Ayon 08/14/2020, 9:22 AM

## 2020-08-15 DIAGNOSIS — I5021 Acute systolic (congestive) heart failure: Secondary | ICD-10-CM | POA: Diagnosis not present

## 2020-08-15 DIAGNOSIS — E785 Hyperlipidemia, unspecified: Secondary | ICD-10-CM | POA: Diagnosis not present

## 2020-08-15 DIAGNOSIS — R778 Other specified abnormalities of plasma proteins: Secondary | ICD-10-CM | POA: Diagnosis not present

## 2020-08-15 DIAGNOSIS — I10 Essential (primary) hypertension: Secondary | ICD-10-CM | POA: Diagnosis not present

## 2020-08-15 LAB — CBC
HCT: 37.5 % (ref 36.0–46.0)
Hemoglobin: 12.2 g/dL (ref 12.0–15.0)
MCH: 28.7 pg (ref 26.0–34.0)
MCHC: 32.5 g/dL (ref 30.0–36.0)
MCV: 88.2 fL (ref 80.0–100.0)
Platelets: 206 10*3/uL (ref 150–400)
RBC: 4.25 MIL/uL (ref 3.87–5.11)
RDW: 14.4 % (ref 11.5–15.5)
WBC: 10.6 10*3/uL — ABNORMAL HIGH (ref 4.0–10.5)
nRBC: 0 % (ref 0.0–0.2)

## 2020-08-15 LAB — BASIC METABOLIC PANEL
Anion gap: 6 (ref 5–15)
BUN: 53 mg/dL — ABNORMAL HIGH (ref 8–23)
CO2: 26 mmol/L (ref 22–32)
Calcium: 8.4 mg/dL — ABNORMAL LOW (ref 8.9–10.3)
Chloride: 99 mmol/L (ref 98–111)
Creatinine, Ser: 1.3 mg/dL — ABNORMAL HIGH (ref 0.44–1.00)
GFR, Estimated: 44 mL/min — ABNORMAL LOW (ref >60)
Glucose, Bld: 103 mg/dL — ABNORMAL HIGH (ref 70–99)
Potassium: 4.1 mmol/L (ref 3.5–5.1)
Sodium: 131 mmol/L — ABNORMAL LOW (ref 135–145)

## 2020-08-15 LAB — MAGNESIUM: Magnesium: 2 mg/dL (ref 1.7–2.4)

## 2020-08-15 LAB — GLUCOSE, CAPILLARY
Glucose-Capillary: 95 mg/dL (ref 70–99)
Glucose-Capillary: 99 mg/dL (ref 70–99)
Glucose-Capillary: 105 mg/dL — ABNORMAL HIGH (ref 70–99)
Glucose-Capillary: 148 mg/dL — ABNORMAL HIGH (ref 70–99)

## 2020-08-15 LAB — HEPARIN LEVEL (UNFRACTIONATED)
Heparin Unfractionated: 0.45 IU/mL (ref 0.30–0.70)
Heparin Unfractionated: 0.5 IU/mL (ref 0.30–0.70)

## 2020-08-15 MED ORDER — SODIUM CHLORIDE 0.9 % IV SOLN: 250.0000 mL | INTRAVENOUS | Status: DC | PRN

## 2020-08-15 MED ORDER — ASPIRIN 81 MG PO CHEW
81.0000 mg | CHEWABLE_TABLET | ORAL | Status: AC
  Administered 2020-08-16: 81 mg via ORAL
  Filled 2020-08-15: qty 1

## 2020-08-15 MED ORDER — SODIUM CHLORIDE 0.9% FLUSH: 3.0000 mL | INTRAVENOUS | Status: DC | PRN

## 2020-08-15 MED ORDER — SODIUM CHLORIDE 0.9% FLUSH: 3.0000 mL | Freq: Two times a day (BID) | INTRAVENOUS | Status: DC

## 2020-08-15 MED ORDER — SODIUM CHLORIDE 0.9 % IV SOLN: INTRAVENOUS | Status: DC

## 2020-08-15 NOTE — Progress Notes (Signed)
Progress Note  Patient Name: Mariah White Date of Encounter: 08/15/2020  [facility] Cardiologist: Mariah Rich, MD   Subjective   She denies any chest pain today and SOB is much improved  Inpatient Medications    Scheduled Meds: . ARIPiprazole  5 mg Oral QHS  . aspirin EC  81 mg Oral Daily  . clopidogrel  75 mg Oral Daily  . DULoxetine  60 mg Oral Daily  . FLUoxetine  40 mg Oral Daily  . insulin aspart  0-9 Units Subcutaneous TID WC  . LORazepam  1 mg Intravenous Once  . metoprolol tartrate  12.5 mg Oral BID  . nicotine  14 mg Transdermal Daily  . rosuvastatin  20 mg Oral Daily   Continuous Infusions: . heparin 850 Units/hr (08/15/20 0641)   PRN Meds: acetaminophen, ALPRAZolam, ipratropium-albuterol, ondansetron (ZOFRAN) IV, oxyCODONE   Vital Signs    Vitals:   08/14/20 1955 08/14/20 2124 08/15/20 0028 08/15/20 0428  BP: (!) 107/59 98/62 (!) 90/52 (!) 102/58  Pulse: 77 78 64 68  Resp: 15  18 15   Temp: 97.9 F (36.6 C)  98.1 F (36.7 C) 97.9 F (36.6 C)  TempSrc: Oral  Oral Oral  SpO2: 98%  96% 96%  Weight:    66.4 kg    Intake/Output Summary (Last 24 hours) at 08/15/2020 0712 Last data filed at 08/15/2020 0641 Gross per 24 hour  Intake 266.63 ml  Output 350 ml  Net -83.37 ml   Last 3 Weights 08/15/2020 08/14/2020 08/13/2020  Weight (lbs) 146 lb 6.2 oz 148 lb 3.2 oz 143 lb 11.8 oz  Weight (kg) 66.4 kg 67.223 kg 65.2 kg  Some encounter information is confidential and restricted. Go to Review Flowsheets activity to see all data.      Telemetry    NSR - Personally Reviewed  ECG  No new EKG to review - Personally Reviewed  Physical Exam   GEN: Well nourished, well developed in no acute distress HEENT: Normal NECK: No JVD; No carotid bruits LYMPHATICS: No lymphadenopathy CARDIAC:RRR, no murmurs, rubs, gallops RESPIRATORY:  Clear to auscultation without rales, wheezing or rhonchi  ABDOMEN: Soft, non-tender, non-distended MUSCULOSKELETAL:  No  edema; No deformity  SKIN: Warm and dry NEUROLOGIC:  Alert and oriented x 3 PSYCHIATRIC:  Normal affect    Labs    High Sensitivity Troponin:   Recent Labs  Lab 08/12/20 1943 08/12/20 2115 08/13/20 0011  TROPONINIHS 197* 164* 151*      Chemistry Recent Labs  Lab 08/12/20 1943 08/13/20 0011 08/14/20 0340 08/15/20 0015  NA 124* 126* 131* 131*  K 3.9 3.8 3.3* 4.1  CL 88* 90* 93* 99  CO2 25 27 28 26   GLUCOSE 150* 149* 91 103*  BUN 46* 48* 56* 53*  CREATININE 1.48* 1.49* 1.59* 1.30*  CALCIUM 8.4* 8.5* 8.4* 8.4*  PROT 7.1  --   --   --   ALBUMIN 2.8*  --   --   --   AST 31  --   --   --   ALT 24  --   --   --   ALKPHOS 93  --   --   --   BILITOT 0.9  --   --   --   GFRNONAA 38* 38* 35* 44*  ANIONGAP 11 9 10 6      Hematology Recent Labs  Lab 08/13/20 0011 08/14/20 0340 08/15/20 0015  WBC 11.3* 10.6* 10.6*  RBC 5.08 4.84 4.25  HGB 14.3  13.6 12.2  HCT 43.6 42.1 37.5  MCV 85.8 87.0 88.2  MCH 28.1 28.1 28.7  MCHC 32.8 32.3 32.5  RDW 14.3 14.4 14.4  PLT 318 226 206    BNP Recent Labs  Lab 08/13/20 0011  BNP 1,089.8*     DDimer No results for input(s): DDIMER in the last 168 hours.   Radiology    No results found.  Cardiac Studies   Echocardiogram 08/06/2020 [facility]): - LV mildly dilated with mildly to moderately increased wall thickness. LVEF 30-35%. Atypical septal motion. Hypokinesis of anterolateral wall and mid inferolateral wall. LV diastolic function is indeterminate.  - RV normal in sizes. Normal systolic function.  - Left atrium mildltly to moderately dilated.  - Right atrium normal in size.  - No significant valvular disease.  Patient Profile     70 y.o. female with a history of chronic diastolic CHF with EF >75% on last Echo in 2018, prior stroke on Aspirin and Plavix, COPD, hypertension and CKD stage III. She was admitted to [facility] on 08/06/2020 with UTI and altered mental status. She subsequently developed chest pain and  found to have a newly reduced LVEF down to 30-35%. Decision was made to transfer patient to [facility] for cardiac catheterization.  Assessment & Plan    New Onset Acute Systolic CHF - BNP 1,089. - Echo at [facility] showed LVEF of 30-35% with atypical septal motion and hypokinesis of anterolateral wall and mid inferolateral wall. EF down from 75% in 2018.  - Currently on IV Lasix 40mg  twice daily.  - she put out 350cc yesterday and is net neg 1.2L since admit>>>UOP dropped off due to diuretics being held due to bump in SCr - SCr slightly bumped from 1.49 to 1.59 yesterday but improved today to 1.3 with holding diuretics - lungs are clear on exam today - compression hose ordered for LE edema - continue to hold diuretics as I do not think she is markedly volume overloaded and her SCr bumped with Lasix on Saturday - ACE/ARB/ARNI are on hold for now given renal function. - continue Lopressor 12.5mg  BID and transition to Toprol at discharge - Continue to monitor daily weights, strict I/O's, and renal function. - replete K+ to keep > 4 (K+ 4.1 this am)>>per TRH - Shared Decision Making/Informed Consent The risks [stroke (1 in 1000), death (1 in 1000), kidney failure [usually temporary] (1 in 500), bleeding (1 in 200), allergic reaction [possibly serious] (1 in 200)], benefits (diagnostic support and management of coronary artery disease) and alternatives of a cardiac catheterization were discussed in detail with Mariah White and she is willing to proceed.  Demand Ischemia vs NSTEMI - High-sensitivity troponin peaked at 369 at [facility]. Trending down here 197 >> 164 >>151. - Echo as above. - she has not had any chest pain - Continue medical management with ASA 81mg  daily Plavix 75mg  daily, lopressor 12.5mg  BID,statin and IV Heparin per pharmacy - LHC planned for Monday and will add RHC as well to assess filling pressures  Possible CAD - History of CAD listed in chart but not mentioned  in outpatient Cardiology notes. Apparently  had a cardiac cath before a long time ago and states she had a stent placed. However, she is still confused and not fully oriented so unsure if this is accurate. - Continue Aspirin, Crestor 20mg  daily and Lopressor 12.5mg  BID - Plan is for cath as above.  Hypertension - BP remains soft today at 102/38mmHg - continue  low dose BB - home amlodipine has been stopped - no ARB at this time due to soft BP  Hyperlipidemia - Lipid panel this admission: Total Cholesterol 205, Triglycerides 118, HDL 53, LDL 128.  - LDL goal <70 given history of stroke and CAD. - started on Crestor 20mg  daily - will need outpt FLp and ALT in 6 weeks.  CKD Stage III - Creatinine 2.10 on admission to [facility].  - Baseline creatinine 1.5 to 1.6 in 06/2020. - SCr 1.3 today - will need to follow closely post cath for contrast induced nephropathy  History of Stroke - On Aspirin and Plavix. - started on statin  Hyponatremia - Sodium dropped as low as 124 but now maintaining around 131 - likely related to CHF and improved with diuresis - Management per primary team.  Hypokalemia -secondary to diuresis -K+ now repleted at 4.1 today  Otherwise, per primary team: - UTI - Metabolic encephalopathy - COPD with chronic respiratory failure on 2L of O2 at home - Type 2 diabetes - Generalized weakness - Tobacco abuse     For questions or updates, please contact [facility] Please consult [email or web address] for contact info under        Signed, 07/2020, MD  08/15/2020, 7:12 AM

## 2020-08-15 NOTE — H&P (View-Only) (Signed)
Progress Note  Patient Name: Mariah White Date of Encounter: 08/15/2020  [facility] Cardiologist: Mariah Rich, MD   Subjective   She denies any chest pain today and SOB is much improved  Inpatient Medications    Scheduled Meds: . ARIPiprazole  5 mg Oral QHS  . aspirin EC  81 mg Oral Daily  . clopidogrel  75 mg Oral Daily  . DULoxetine  60 mg Oral Daily  . FLUoxetine  40 mg Oral Daily  . insulin aspart  0-9 Units Subcutaneous TID WC  . LORazepam  1 mg Intravenous Once  . metoprolol tartrate  12.5 mg Oral BID  . nicotine  14 mg Transdermal Daily  . rosuvastatin  20 mg Oral Daily   Continuous Infusions: . heparin 850 Units/hr (08/15/20 0641)   PRN Meds: acetaminophen, ALPRAZolam, ipratropium-albuterol, ondansetron (ZOFRAN) IV, oxyCODONE   Vital Signs    Vitals:   08/14/20 1955 08/14/20 2124 08/15/20 0028 08/15/20 0428  BP: (!) 107/59 98/62 (!) 90/52 (!) 102/58  Pulse: 77 78 64 68  Resp: 15  18 15   Temp: 97.9 F (36.6 C)  98.1 F (36.7 C) 97.9 F (36.6 C)  TempSrc: Oral  Oral Oral  SpO2: 98%  96% 96%  Weight:    66.4 kg    Intake/Output Summary (Last 24 hours) at 08/15/2020 0712 Last data filed at 08/15/2020 0641 Gross per 24 hour  Intake 266.63 ml  Output 350 ml  Net -83.37 ml   Last 3 Weights 08/15/2020 08/14/2020 08/13/2020  Weight (lbs) 146 lb 6.2 oz 148 lb 3.2 oz 143 lb 11.8 oz  Weight (kg) 66.4 kg 67.223 kg 65.2 kg  Some encounter information is confidential and restricted. Go to Review Flowsheets activity to see all data.      Telemetry    NSR - Personally Reviewed  ECG  No new EKG to review - Personally Reviewed  Physical Exam   GEN: Well nourished, well developed in no acute distress HEENT: Normal NECK: No JVD; No carotid bruits LYMPHATICS: No lymphadenopathy CARDIAC:RRR, no murmurs, rubs, gallops RESPIRATORY:  Clear to auscultation without rales, wheezing or rhonchi  ABDOMEN: Soft, non-tender, non-distended MUSCULOSKELETAL:  No  edema; No deformity  SKIN: Warm and dry NEUROLOGIC:  Alert and oriented x 3 PSYCHIATRIC:  Normal affect    Labs    High Sensitivity Troponin:   Recent Labs  Lab 08/12/20 1943 08/12/20 2115 08/13/20 0011  TROPONINIHS 197* 164* 151*      Chemistry Recent Labs  Lab 08/12/20 1943 08/13/20 0011 08/14/20 0340 08/15/20 0015  NA 124* 126* 131* 131*  K 3.9 3.8 3.3* 4.1  CL 88* 90* 93* 99  CO2 25 27 28 26   GLUCOSE 150* 149* 91 103*  BUN 46* 48* 56* 53*  CREATININE 1.48* 1.49* 1.59* 1.30*  CALCIUM 8.4* 8.5* 8.4* 8.4*  PROT 7.1  --   --   --   ALBUMIN 2.8*  --   --   --   AST 31  --   --   --   ALT 24  --   --   --   ALKPHOS 93  --   --   --   BILITOT 0.9  --   --   --   GFRNONAA 38* 38* 35* 44*  ANIONGAP 11 9 10 6      Hematology Recent Labs  Lab 08/13/20 0011 08/14/20 0340 08/15/20 0015  WBC 11.3* 10.6* 10.6*  RBC 5.08 4.84 4.25  HGB 14.3  13.6 12.2  HCT 43.6 42.1 37.5  MCV 85.8 87.0 88.2  MCH 28.1 28.1 28.7  MCHC 32.8 32.3 32.5  RDW 14.3 14.4 14.4  PLT 318 226 206    BNP Recent Labs  Lab 08/13/20 0011  BNP 1,089.8*     DDimer No results for input(s): DDIMER in the last 168 hours.   Radiology    No results found.  Cardiac Studies   Echocardiogram 08/06/2020 [facility]): - LV mildly dilated with mildly to moderately increased wall thickness. LVEF 30-35%. Atypical septal motion. Hypokinesis of anterolateral wall and mid inferolateral wall. LV diastolic function is indeterminate.  - RV normal in sizes. Normal systolic function.  - Left atrium mildltly to moderately dilated.  - Right atrium normal in size.  - No significant valvular disease.  Patient Profile     70 y.o. female with a history of chronic diastolic CHF with EF >75% on last Echo in 2018, prior stroke on Aspirin and Plavix, COPD, hypertension and CKD stage III. She was admitted to [facility] on 08/06/2020 with UTI and altered mental status. She subsequently developed chest pain and  found to have a newly reduced LVEF down to 30-35%. Decision was made to transfer patient to [facility] for cardiac catheterization.  Assessment & Plan    New Onset Acute Systolic CHF - BNP 1,089. - Echo at [facility] showed LVEF of 30-35% with atypical septal motion and hypokinesis of anterolateral wall and mid inferolateral wall. EF down from 75% in 2018.  - Currently on IV Lasix 40mg  twice daily.  - she put out 350cc yesterday and is net neg 1.2L since admit>>>UOP dropped off due to diuretics being held due to bump in SCr - SCr slightly bumped from 1.49 to 1.59 yesterday but improved today to 1.3 with holding diuretics - lungs are clear on exam today - compression hose ordered for LE edema - continue to hold diuretics as I do not think she is markedly volume overloaded and her SCr bumped with Lasix on Saturday - ACE/ARB/ARNI are on hold for now given renal function. - continue Lopressor 12.5mg  BID and transition to Toprol at discharge - Continue to monitor daily weights, strict I/O's, and renal function. - replete K+ to keep > 4 (K+ 4.1 this am)>>per TRH - Shared Decision Making/Informed Consent The risks [stroke (1 in 1000), death (1 in 1000), kidney failure [usually temporary] (1 in 500), bleeding (1 in 200), allergic reaction [possibly serious] (1 in 200)], benefits (diagnostic support and management of coronary artery disease) and alternatives of a cardiac catheterization were discussed in detail with Mariah White and she is willing to proceed.  Demand Ischemia vs NSTEMI - High-sensitivity troponin peaked at 369 at [facility]. Trending down here 197 >> 164 >>151. - Echo as above. - she has not had any chest pain - Continue medical management with ASA 81mg  daily Plavix 75mg  daily, lopressor 12.5mg  BID,statin and IV Heparin per pharmacy - LHC planned for Monday and will add RHC as well to assess filling pressures  Possible CAD - History of CAD listed in chart but not mentioned  in outpatient Cardiology notes. Apparently  had a cardiac cath before a long time ago and states she had a stent placed. However, she is still confused and not fully oriented so unsure if this is accurate. - Continue Aspirin, Crestor 20mg  daily and Lopressor 12.5mg  BID - Plan is for cath as above.  Hypertension - BP remains soft today at 102/38mmHg - continue  low dose BB - home amlodipine has been stopped - no ARB at this time due to soft BP  Hyperlipidemia - Lipid panel this admission: Total Cholesterol 205, Triglycerides 118, HDL 53, LDL 128.  - LDL goal <70 given history of stroke and CAD. - started on Crestor 20mg  daily - will need outpt FLp and ALT in 6 weeks.  CKD Stage III - Creatinine 2.10 on admission to [facility].  - Baseline creatinine 1.5 to 1.6 in 06/2020. - SCr 1.3 today - will need to follow closely post cath for contrast induced nephropathy  History of Stroke - On Aspirin and Plavix. - started on statin  Hyponatremia - Sodium dropped as low as 124 but now maintaining around 131 - likely related to CHF and improved with diuresis - Management per primary team.  Hypokalemia -secondary to diuresis -K+ now repleted at 4.1 today  Otherwise, per primary team: - UTI - Metabolic encephalopathy - COPD with chronic respiratory failure on 2L of O2 at home - Type 2 diabetes - Generalized weakness - Tobacco abuse     For questions or updates, please contact [facility] Please consult [email or web address] for contact info under        Signed, 07/2020, MD  08/15/2020, 7:12 AM

## 2020-08-15 NOTE — Progress Notes (Signed)
Spoke with pt's son, Elta Guadeloupe. He is agreeable to pt being d/c'd to SNF/Rehab at discharge. Updated contact info obtained from son. See Emergency contact list. Son does not have mobile #. Best way to contact him is by calling [employer] mobile. Pt also agreeable to SNF/Rehab at discharge.
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
## 2020-08-15 NOTE — Progress Notes (Signed)
PROGRESS NOTE    Mariah HUANTE  White:[id number] DOB: 08/13/1950 DOA: 08/12/2020 PCP: [facility]    Brief Narrative:  Mariah White is a 70 year old female with past medical history significant for essential hypertension, chronic diastolic congestive heart failure, CAD, prior CVA, COPD on 2 L nasal cannula oxygen at baseline presents as a transfer from [facility] for chest pain.  Patient had been hospitalized at [facility] from 5/6-08/2010; after initially presenting following fall at home with complaints of left hip pain.  She reports at that time she was reaching for cigarettes while in bed and fell without loss of consciousness.  CT scan of her head without contrast did not show any acute intracranial abnormalities and x-rays pelvis with no acute fractures.  While in the ED at [facility] ER, patient reported to complain of some dysuria and was noted to be with some mild hypertension.  Hypotension resolved with IV fluids and she was started on IV antibiotics for urinary tract infection with ceftriaxone.  During her hospital stay, patient has been noted to be intermittently confused at times with plan to transfer patient to a rehab facility due to ongoing weakness.  Patient then started to complain of chest pain 2-3 days prior to transfer with localization in the middle of her chest with pressure-like feeling.  Also associated with shortness of breath with exertion, nausea/vomiting and diaphoresis. Chest pain was reportedly brought to the provider's attention the day prior to transfer and EKG was found to have concern for Q waves.  High-sensitivity troponins were noted to be elevated 325->369.  Echocardiogram was obtained which showed EF of 30 to 35%.  Patient has been started on heparin IV as well as evaluated by cardiology who recommended cardiac cath.  Case was discussed with Dr. Wyline Mood of cardiology here at [facility] as well as Dr. Ophelia Charter of [facility].  Patient  accepted to a cardiac telemetry bed as inpatient.    Assessment & Plan:   Principal Problem:   Elevated troponin Active Problems:   COPD (chronic obstructive pulmonary disease) (HCC)   CAD (coronary artery disease)   Essential hypertension   Pressure injury of skin   Acute systolic (congestive) heart failure (HCC)   Leukocytosis   NSTEMI (non-ST elevated myocardial infarction) (HCC)   Acute metabolic encephalopathy   Hyponatremia   Hyperlipidemia with target LDL less than 70   NSTEMI Systolic congestive heart failure, acute, new diagnosis Patient presenting from [facility] with chest pain.  TTE ED at outside hospital notable for EF 30-35%.  BNP 1089.  On arrival, high-sensitivity troponin elevated 197>164>151.  EKG with NSR, rate 76, QTc 499, T wave inversions in 1/aVL, lead II, V4-V6; there is change in comparison to EKG dated 05/04/2017 in lead II, V4-V6, otherwise unchanged. --Cardiology following, appreciate assistance --Cardiology holding IV diuresis today for bump in creatinine and anticipated [facility] Monday --Heparin drip --Metoprolol tartrate 12.5 mg p.o. twice daily --Holding ACE inhibitor/ARB/ARNI given renal function and borderline hypotension --Strict I's and O's and daily weights --Cardiology planning left/right heart catheterization 5/16 --Continue monitor on telemetry --NPO after MN  Chronic respiratory failure with hypoxia, 2L [place] baseline COPD --Continue supplemental oxygen, maintain SPO2 > 88%, currently on 2L [place] which is her home dose --DuoNebs as needed  Hyponatremia On transfer, sodium noted to be 124.  Suspect volume overload in the setting of decompensated congestive heart failure.  Urine sodium 73, urine osmolality 430, serum osmolality 289.  --Na 124>126>131>131 --BMP in  the a.m.  Hypokalemia: resolved Potassium 4.1 today --Repeat electrolytes in a.m. to include magnesium  Recent UTI Patient was treated with IV Rocephin during her most recent  hospitalization.  Leukocytosis now resolved.  Type 2 diabetes mellitus Hemoglobin A1c 6.0, well controlled.  On Ozempic and Januvia 25 mg p.o. daily outpatient. --SSI for coverage --CBGs QAC/HS  CKD stage IIIb Baseline creatinine 1.5-1.6.  Creatinine on admission to [facility] 2.10. --Cr 1.48>1.49>1.59>1.30 --Cardiology holding IV furosemide as above --BMP daily  Hyperlipidemia HDL 53, LDL 128. --Continue Crestor 20 mg p.o. daily  CAD  --Aspirin 81 mg p.o. daily, Plavix 75 mg p.o. daily, statin  Depression/anxiety --Abilify 5 mg p.o. nightly --Duloxetine 60 mg p.o. daily --Fluoxetine 40 mg p.o. daily  Tobacco use disorder Continues to endorse cigarette use.  Counseled on need for complete cessation. --Nicotine patch  Generalized weakness Records on transfer report patient has been having difficulty walking at baseline with multiple falls.  During her hospitalization at [facility] ER, patient was noted to have continued weakness for which rehab had initially been recommended. --PT/OT recommend home health on discharge --Continue therapy while inpatient  DVT prophylaxis: Heparin drip   Code Status: Full Code Family Communication: No family present at bedside this morning, attempted to contact patient's son, Jomarie Longs via telephone, went straight to voicemail  Disposition Plan:  Level of care: Telemetry Cardiac Status is: Inpatient  Remains inpatient appropriate because:Persistent severe electrolyte disturbances, Ongoing diagnostic testing needed not appropriate for outpatient work up, Unsafe d/c plan, IV treatments appropriate due to intensity of illness or inability to take PO and Inpatient level of care appropriate due to severity of illness   Dispo: The patient is from: Home              Anticipated d/c is to: SNF              Patient currently is not medically stable to d/c.   Difficult to place patient No   Consultants:   Cardiology  Procedures:    None  Antimicrobials:   Ceftriaxone completed course at OSH   Subjective: Patient seen and examined at bedside, resting comfortably.  Lying in bed.  No specific complaints this morning.  Cardiology plans left/right heart catheterization tomorrow.  Continues without chest pain.  No family present at bedside. Denies headache, no dizziness, no chest pain, no palpitations, no fever/chills/night sweats, no nausea/vomiting/diarrhea, no abdominal pain, no cough/congestion.  Nursing reports occasional confusion overnight, otherwise no other acute issues/events per nursing staff.  Objective: Vitals:   08/14/20 2124 08/15/20 0028 08/15/20 0428 08/15/20 0959  BP: 98/62 (!) 90/52 (!) 102/58 (!) 115/59  Pulse: 78 64 68 70  Resp:  18 15 16   Temp:  98.1 F (36.7 C) 97.9 F (36.6 C) (!) 97.1 F (36.2 C)  TempSrc:  Oral Oral Oral  SpO2:  96% 96%   Weight:   66.4 kg     Intake/Output Summary (Last 24 hours) at 08/15/2020 1113 Last data filed at 08/15/2020 0641 Gross per 24 hour  Intake 266.63 ml  Output 350 ml  Net -83.37 ml   Filed Weights   08/13/20 0413 08/14/20 0508 08/15/20 0428  Weight: 65.2 kg 67.2 kg 66.4 kg    Examination:  General exam: Appears calm and comfortable Respiratory system: Slightly decreased breath sounds bilateral bases, otherwise clear to auscultation bilaterally, no wheezing/crackles, normal respiratory effort, on 2 L [place] (2L at home) Cardiovascular system: S1 & S2 heard, RRR. No JVD, murmurs, rubs, gallops  or clicks. No pedal edema. Gastrointestinal system: Abdomen is nondistended, soft and nontender. No organomegaly or masses felt. Normal bowel sounds heard. Central nervous system: Alert and oriented to person/place/time/situation. No focal neurological deficits. Extremities: Symmetric 5 x 5 power. Skin: No rashes, lesions or ulcers Psychiatry: Judgement and insight appear poor. Mood & affect appropriate.     Data Reviewed: I have personally reviewed  following labs and imaging studies  CBC: Recent Labs  Lab 08/12/20 1943 08/13/20 0011 08/14/20 0340 08/15/20 0015  WBC 11.1* 11.3* 10.6* 10.6*  HGB 14.3 14.3 13.6 12.2  HCT 42.6 43.6 42.1 37.5  MCV 85.5 85.8 87.0 88.2  PLT 297 318 226 206   Basic Metabolic Panel: Recent Labs  Lab 08/12/20 1943 08/13/20 0011 08/14/20 0340 08/15/20 0015  NA 124* 126* 131* 131*  K 3.9 3.8 3.3* 4.1  CL 88* 90* 93* 99  CO2 25 27 28 26   GLUCOSE 150* 149* 91 103*  BUN 46* 48* 56* 53*  CREATININE 1.48* 1.49* 1.59* 1.30*  CALCIUM 8.4* 8.5* 8.4* 8.4*  MG  --   --  1.9 2.0   GFR: CrCl cannot be calculated (Unknown ideal weight.). Liver Function Tests: Recent Labs  Lab 08/12/20 1943  AST 31  ALT 24  ALKPHOS 93  BILITOT 0.9  PROT 7.1  ALBUMIN 2.8*   No results for input(s): LIPASE, AMYLASE in the last 168 hours. No results for input(s): AMMONIA in the last 168 hours. Coagulation Profile: No results for input(s): INR, PROTIME in the last 168 hours. Cardiac Enzymes: No results for input(s): CKTOTAL, CKMB, CKMBINDEX, TROPONINI in the last 168 hours. BNP (last 3 results) No results for input(s): PROBNP in the last 8760 hours. HbA1C: Recent Labs    08/12/20 2115  HGBA1C 6.0*   CBG: Recent Labs  Lab 08/14/20 0809 08/14/20 1215 08/14/20 1630 08/14/20 2103 08/15/20 0755  GLUCAP 121* 70 113* 136* 95   Lipid Profile: Recent Labs    08/13/20 0011  CHOL 205*  HDL 53  LDLCALC 128*  TRIG 118  CHOLHDL 3.9   Thyroid Function Tests: Recent Labs    08/14/20 0340  TSH 1.358   Anemia Panel: No results for input(s): VITAMINB12, FOLATE, FERRITIN, TIBC, IRON, RETICCTPCT in the last 72 hours. Sepsis Labs: No results for input(s): PROCALCITON, LATICACIDVEN in the last 168 hours.  No results found for this or any previous visit (from the past 240 hour(s)).       Radiology Studies: No results found.      Scheduled Meds: . ARIPiprazole  5 mg Oral QHS  . aspirin EC  81  mg Oral Daily  . clopidogrel  75 mg Oral Daily  . DULoxetine  60 mg Oral Daily  . FLUoxetine  40 mg Oral Daily  . insulin aspart  0-9 Units Subcutaneous TID WC  . LORazepam  1 mg Intravenous Once  . metoprolol tartrate  12.5 mg Oral BID  . nicotine  14 mg Transdermal Daily  . rosuvastatin  20 mg Oral Daily   Continuous Infusions: . heparin 850 Units/hr (08/15/20 0952)     LOS: 3 days    Time spent: 37 minutes spent on chart review, discussion with nursing staff, consultants, updating family and interview/physical exam; more than 50% of that time was spent in counseling and/or coordination of care.    08/17/20 Alvira Philips, DO [facility] Available via Epic secure chat 7am-7pm After these hours, please refer to coverage provider listed on [email or web address] 08/15/2020, 11:13 AM

## 2020-08-15 NOTE — Progress Notes (Signed)
ANTICOAGULATION CONSULT NOTE - Follow Up Consult  Pharmacy Consult for heparin Indication: demand ischemia vs NSTEMI  Labs: Recent Labs    08/12/20 1943 08/12/20 2115 08/13/20 0011 08/13/20 1027 08/14/20 0340 08/14/20 1355 08/15/20 0015  HGB 14.3  --  14.3  --  13.6  --  12.2  HCT 42.6  --  43.6  --  42.1  --  37.5  PLT 297  --  318  --  226  --  206  HEPARINUNFRC  --   --  >1.10*   < > 0.25* 0.29* 0.45  CREATININE 1.48*  --  1.49*  --  1.59*  --  1.30*  TROPONINIHS 197* 164* 151*  --   --   --   --    < > = values in this interval not displayed.    Assessment/Plan:  70yo female therapeutic on heparin after rate changes. Will continue gtt at current rate of 850 units/hr and confirm stable with additional level.   Vernard Gambles, PharmD, BCPS  08/15/2020,1:30 AM

## 2020-08-15 NOTE — Progress Notes (Addendum)
ANTICOAGULATION CONSULT NOTE  Pharmacy Consult for IV Heparin Indication: chest pain/ACS  Allergies  Allergen Reactions  . Tape Other (See Comments)    SKIN IS THIN- WILL TEAR AND BRUISE EASILY!! PAPER TAPE IS TOLERATED.  Marland Kitchen Penicillins Itching    Has patient had a PCN reaction causing immediate rash, facial/tongue/throat swelling, SOB or lightheadedness with hypotension: Yes Has patient had a PCN reaction causing severe rash involving mucus membranes or skin necrosis: No Has patient had a PCN reaction that required hospitalization: No Has patient had a PCN reaction occurring within the last 10 years: No If all of the above answers are "NO", then may proceed with Cephalosporin use.     Patient Measurements: Weight: 66.4 kg (146 lb 6.2 oz) Heparin Dosing Weight: 61.4 kg (based on ht from 11/21)  Vital Signs: Temp: 97.9 F (36.6 C) (05/15 0428) Temp Source: Oral (05/15 0428) BP: 102/58 (05/15 0428) Pulse Rate: 68 (05/15 0428)  Labs: Recent Labs    08/12/20 1943 08/12/20 2115 08/13/20 0011 08/13/20 1027 08/14/20 0340 08/14/20 1355 08/15/20 0015 08/15/20 0731  HGB 14.3  --  14.3  --  13.6  --  12.2  --   HCT 42.6  --  43.6  --  42.1  --  37.5  --   PLT 297  --  318  --  226  --  206  --   HEPARINUNFRC  --   --  >1.10*   < > 0.25* 0.29* 0.45 0.50  CREATININE 1.48*  --  1.49*  --  1.59*  --  1.30*  --   TROPONINIHS 197* 164* 151*  --   --   --   --   --    < > = values in this interval not displayed.    CrCl cannot be calculated (Unknown ideal weight.).   Assessment: 70 yr old female transferred from OSH on IV heparin for R/O ACS. Pt was on no anticoagulant PTA to OSH. Cardiac cath planned for Monday. Plans are for cath on Monday  Confirmatory heparin level is therapeutic at 0.50 units/hr. CBC stable, no s/sx bleeding reported.   Goal of Therapy:  Heparin level 0.3-0.7 units/ml Monitor platelets by anticoagulation protocol: Yes   Plan:  Continue heparin infusion  850 units/hr Daily heparin level, CBC  Laverna Peace, PharmD PGY-1 Pharmacy Resident 08/15/2020 8:24 AM Please see AMION for all pharmacy numbers

## 2020-08-16 ENCOUNTER — Inpatient Hospital Stay (HOSPITAL_COMMUNITY): Payer: Medicare Other

## 2020-08-16 ENCOUNTER — Encounter (HOSPITAL_COMMUNITY)
Admission: AD | Disposition: A | Discharge status: Skilled Nursing Facility | Payer: Self-pay | Source: Other Acute Inpatient Hospital | Attending: Internal Medicine

## 2020-08-16 ENCOUNTER — Encounter (HOSPITAL_COMMUNITY): Payer: Self-pay | Admitting: Internal Medicine

## 2020-08-16 DIAGNOSIS — I5021 Acute systolic (congestive) heart failure: Secondary | ICD-10-CM

## 2020-08-16 DIAGNOSIS — R778 Other specified abnormalities of plasma proteins: Secondary | ICD-10-CM | POA: Diagnosis not present

## 2020-08-16 DIAGNOSIS — I10 Essential (primary) hypertension: Secondary | ICD-10-CM | POA: Diagnosis not present

## 2020-08-16 DIAGNOSIS — E785 Hyperlipidemia, unspecified: Secondary | ICD-10-CM | POA: Diagnosis not present

## 2020-08-16 DIAGNOSIS — N183 Chronic kidney disease, stage 3 unspecified: Secondary | ICD-10-CM | POA: Diagnosis not present

## 2020-08-16 DIAGNOSIS — I2510 Atherosclerotic heart disease of native coronary artery without angina pectoris: Secondary | ICD-10-CM

## 2020-08-16 DIAGNOSIS — Z720 Tobacco use: Secondary | ICD-10-CM

## 2020-08-16 HISTORY — PX: LEFT HEART CATH AND CORONARY ANGIOGRAPHY: CATH118249

## 2020-08-16 LAB — MAGNESIUM: Magnesium: 2.1 mg/dL (ref 1.7–2.4)

## 2020-08-16 LAB — ECHOCARDIOGRAM LIMITED
AR max vel: 2.41 cm2
AV Area VTI: 2.45 cm2
AV Area mean vel: 2.31 cm2
AV Mean grad: 4 mmHg
AV Peak grad: 8.1 mmHg
Ao pk vel: 1.42 m/s
Area-P 1/2: 4.06 cm2
Height: 62 in
S' Lateral: 3.4 cm
Weight: 2249.6 oz

## 2020-08-16 LAB — CBC
HCT: 38.1 % (ref 36.0–46.0)
Hemoglobin: 11.7 g/dL — ABNORMAL LOW (ref 12.0–15.0)
MCH: 28.1 pg (ref 26.0–34.0)
MCHC: 30.7 g/dL (ref 30.0–36.0)
MCV: 91.6 fL (ref 80.0–100.0)
Platelets: 199 10*3/uL (ref 150–400)
RBC: 4.16 MIL/uL (ref 3.87–5.11)
RDW: 14.6 % (ref 11.5–15.5)
WBC: 12.1 10*3/uL — ABNORMAL HIGH (ref 4.0–10.5)
nRBC: 0 % (ref 0.0–0.2)

## 2020-08-16 LAB — BASIC METABOLIC PANEL
Anion gap: 3 — ABNORMAL LOW (ref 5–15)
BUN: 30 mg/dL — ABNORMAL HIGH (ref 8–23)
CO2: 28 mmol/L (ref 22–32)
Calcium: 8.5 mg/dL — ABNORMAL LOW (ref 8.9–10.3)
Chloride: 104 mmol/L (ref 98–111)
Creatinine, Ser: 1.12 mg/dL — ABNORMAL HIGH (ref 0.44–1.00)
GFR, Estimated: 53 mL/min — ABNORMAL LOW (ref >60)
Glucose, Bld: 90 mg/dL (ref 70–99)
Potassium: 4.2 mmol/L (ref 3.5–5.1)
Sodium: 135 mmol/L (ref 135–145)

## 2020-08-16 LAB — GLUCOSE, CAPILLARY
Glucose-Capillary: 93 mg/dL (ref 70–99)
Glucose-Capillary: 154 mg/dL — ABNORMAL HIGH (ref 70–99)
Glucose-Capillary: 108 mg/dL — ABNORMAL HIGH (ref 70–99)
Glucose-Capillary: 128 mg/dL — ABNORMAL HIGH (ref 70–99)

## 2020-08-16 LAB — HEPARIN LEVEL (UNFRACTIONATED): Heparin Unfractionated: 0.32 IU/mL (ref 0.30–0.70)

## 2020-08-16 SURGERY — LEFT HEART CATH AND CORONARY ANGIOGRAPHY
Anesthesia: LOCAL

## 2020-08-16 MED ORDER — FENTANYL CITRATE (PF) 100 MCG/2ML IJ SOLN
INTRAMUSCULAR | Status: AC
  Filled 2020-08-16: qty 2

## 2020-08-16 MED ORDER — IOHEXOL 350 MG/ML SOLN
INTRAVENOUS | Status: DC | PRN
  Administered 2020-08-16: 30 mL

## 2020-08-16 MED ORDER — LABETALOL HCL 5 MG/ML IV SOLN: 10.0000 mg | INTRAVENOUS | Status: AC | PRN

## 2020-08-16 MED ORDER — SODIUM CHLORIDE 0.9% FLUSH
3.0000 mL | Freq: Two times a day (BID) | INTRAVENOUS | Status: DC
  Administered 2020-08-16 – 2020-08-19 (×7): 3 mL via INTRAVENOUS

## 2020-08-16 MED ORDER — SODIUM CHLORIDE 0.9 % IV SOLN: 250.0000 mL | INTRAVENOUS | Status: DC | PRN

## 2020-08-16 MED ORDER — ROSUVASTATIN CALCIUM 20 MG PO TABS
40.0000 mg | ORAL_TABLET | Freq: Every day | ORAL | Status: DC
  Administered 2020-08-16 – 2020-08-20 (×5): 40 mg via ORAL
  Filled 2020-08-16 (×5): qty 2

## 2020-08-16 MED ORDER — SODIUM CHLORIDE 0.9 % IV SOLN: INTRAVENOUS | Status: AC

## 2020-08-16 MED ORDER — HEPARIN (PORCINE) IN NACL 1000-0.9 UT/500ML-% IV SOLN
INTRAVENOUS | Status: AC
  Filled 2020-08-16: qty 1000

## 2020-08-16 MED ORDER — HYDRALAZINE HCL 20 MG/ML IJ SOLN: 10.0000 mg | INTRAMUSCULAR | Status: AC | PRN

## 2020-08-16 MED ORDER — VERAPAMIL HCL 2.5 MG/ML IV SOLN
INTRAVENOUS | Status: DC | PRN
  Administered 2020-08-16: 10 mL via INTRA_ARTERIAL

## 2020-08-16 MED ORDER — MIDAZOLAM HCL 2 MG/2ML IJ SOLN
INTRAMUSCULAR | Status: AC
  Filled 2020-08-16: qty 2

## 2020-08-16 MED ORDER — ENOXAPARIN SODIUM 40 MG/0.4ML IJ SOSY
40.0000 mg | PREFILLED_SYRINGE | INTRAMUSCULAR | Status: DC
  Administered 2020-08-17 – 2020-08-19 (×3): 40 mg via SUBCUTANEOUS
  Filled 2020-08-16 (×3): qty 0.4

## 2020-08-16 MED ORDER — SODIUM CHLORIDE 0.9% FLUSH: 3.0000 mL | INTRAVENOUS | Status: DC | PRN

## 2020-08-16 MED ORDER — VERAPAMIL HCL 2.5 MG/ML IV SOLN
INTRAVENOUS | Status: AC
  Filled 2020-08-16: qty 2

## 2020-08-16 MED ORDER — HEPARIN SODIUM (PORCINE) 1000 UNIT/ML IJ SOLN
INTRAMUSCULAR | Status: DC | PRN
  Administered 2020-08-16: 3000 [IU] via INTRAVENOUS

## 2020-08-16 MED ORDER — LIDOCAINE HCL (PF) 1 % IJ SOLN
INTRAMUSCULAR | Status: DC | PRN
  Administered 2020-08-16: 2 mL

## 2020-08-16 MED ORDER — LIDOCAINE HCL (PF) 1 % IJ SOLN
INTRAMUSCULAR | Status: AC
  Filled 2020-08-16: qty 30

## 2020-08-16 MED ORDER — HEPARIN SODIUM (PORCINE) 1000 UNIT/ML IJ SOLN
INTRAMUSCULAR | Status: AC
  Filled 2020-08-16: qty 1

## 2020-08-16 MED ORDER — HEPARIN (PORCINE) IN NACL 1000-0.9 UT/500ML-% IV SOLN
INTRAVENOUS | Status: DC | PRN
  Administered 2020-08-16 (×2): 500 mL

## 2020-08-16 SURGICAL SUPPLY — 9 items

## 2020-08-16 NOTE — Progress Notes (Signed)
Physical Therapy Treatment Patient Details Name: Mariah White MRN: [id number] DOB: [birth year] Today's Date: 08/16/2020    History of Present Illness Pt is a 70 y.o. female who presents to [facility] hospital on 08/12/2020 as a transfer from [place] with reports of chest and L hip pain after fall. Head CT with no acute abnormalities. Pelvic xray negative for fx. EKG with concern for Q waves; high-sensitivity troponins elevated. Workup for newly dx acute systolic CHF. PMH includes HTN, diastolic CHF, CAD, CVA, COPD (2L O2 baseline)   PT Comments    Pt with decreased participation this session; pt declining OOB mobility despite max encouragement. Pt demonstrates good BUE/BLE strength by actively resisting attempts at mobility/repositioning, but not performing AROM/therex to command. Pt with purposeful movement, but not following simple commands. Minimal engagement in conversation; pt states, "You're so aggravating... leave me alone..." Session limited by patient's cognitive status. Recommend SNF-level therapies to maximize functional mobility; pt may benefit from LTC/ALF after that. Will continue to follow acutely to address established goals.   Follow Up Recommendations  SNF;Supervision/Assistance - 24 hour     Equipment Recommendations  None recommended by PT    Recommendations for Other Services       Precautions / Restrictions Precautions Precautions: Fall;Other (comment) Precaution Comments: Per chart, wears 2L O2 baseline (maintaining 100% on RA during sesesion 5/16) Restrictions Weight Bearing Restrictions: No    Mobility  Bed Mobility               General bed mobility comments: Pt strongly resisting any attempts to initiate and assist OOB mobility    Transfers                 General transfer comment:  (pt declining)  Ambulation/Gait                 Stairs             Wheelchair Mobility    Modified Rankin (Stroke Patients Only)        Balance                                            Cognition Arousal/Alertness: Awake/alert Behavior During Therapy: Flat affect;Agitated Overall Cognitive Status: No family/caregiver present to determine baseline cognitive functioning Area of Impairment: Orientation;Attention;Memory;Following commands;Safety/judgement;Awareness;Problem solving                   Current Attention Level: Focused;Sustained Memory: Decreased short-term memory Following Commands: Follows one step commands inconsistently Safety/Judgement: Decreased awareness of safety;Decreased awareness of deficits Awareness: Intellectual Problem Solving: Slow processing;Decreased initiation;Difficulty sequencing;Requires verbal cues;Requires tactile cues General Comments: Pt with minimal responses to questions, only responding with name mentioned first, but then becoming aggravated by discussion ("you're really aggravating"); not consistently interacting or following commands      Exercises Other Exercises Other Exercises: Strong bilateral elbow flex/ext and wrist/grip strength when grabbing therapist's arm; would not repeat AROM when asked for therex Other Exercises: Actively resisting BLE movement/repositioning, but not actively moving when asked for AROM/therex    General Comments General comments (skin integrity, edema, etc.): Pt received on 2L O2 [place]; SpO2 maintaining 100% on RA when removed. Initiated conversation regarding d/c planning and importance of OOB mobility while admitted; pt adamantly declining OOB mobility, reports not interested in post-acute rehab; question with cognitive impairment and questionable ability to make decisions Banker and  SW aware)      Pertinent Vitals/Pain Pain Assessment: Faces Faces Pain Scale: No hurt Pain Intervention(s): Monitored during session    Home Living                      Prior Function            PT Goals (current goals can now be  found in the care plan section) Acute Rehab PT Goals Patient Stated Goal: "Leave me the fuck alone" PT Goal Formulation: With patient Time For Goal Achievement: 08/28/20 Potential to Achieve Goals: Fair Progress towards PT goals: Not progressing toward goals - comment (limited by AMS, resisting mobility)    Frequency    Min 2X/week      PT Plan Discharge plan needs to be updated;Frequency needs to be updated    Co-evaluation              AM-PAC PT "6 Clicks" Mobility   Outcome Measure  Help needed turning from your back to your side while in a flat bed without using bedrails?: A Lot Help needed moving from lying on your back to sitting on the side of a flat bed without using bedrails?: A Lot Help needed moving to and from a bed to a chair (including a wheelchair)?: A Lot Help needed standing up from a chair using your arms (e.g., wheelchair or bedside chair)?: A Lot Help needed to walk in hospital room?: A Lot Help needed climbing 3-5 steps with a railing? : A Lot 6 Click Score: 12    End of Session   Activity Tolerance: Other (comment) (pt limited by cognitive status) Patient left: in bed;with call bell/phone within reach;with bed alarm set Nurse Communication: Mobility status PT Visit Diagnosis: Other abnormalities of gait and mobility (R26.89);Muscle weakness (generalized) (M62.81);Difficulty in walking, not elsewhere classified (R26.2);History of falling (Z91.81)     Time: 6578-4696 PT Time Calculation (min) (ACUTE ONLY): 15 min  Charges:  $Therapeutic Activity: 8-22 mins                     Ina Homes, PT, DPT Acute Rehabilitation Services  Pager [phone] Office [phone]  Malachy Chamber 08/16/2020, 1:51 PM

## 2020-08-16 NOTE — Progress Notes (Addendum)
Progress Note  Patient Name: Mariah White Date of Encounter: 08/16/2020  Primary Cardiologist: Dina Rich, MD   Subjective   Somnolent this AM. Oriented. Plan for cath today given new systolic dysfunction. No chest pain, SOB   Inpatient Medications    Scheduled Meds: . ARIPiprazole  5 mg Oral QHS  . aspirin EC  81 mg Oral Daily  . clopidogrel  75 mg Oral Daily  . DULoxetine  60 mg Oral Daily  . FLUoxetine  40 mg Oral Daily  . insulin aspart  0-9 Units Subcutaneous TID WC  . LORazepam  1 mg Intravenous Once  . metoprolol tartrate  12.5 mg Oral BID  . nicotine  14 mg Transdermal Daily  . rosuvastatin  20 mg Oral Daily  . sodium chloride flush  3 mL Intravenous Q12H   Continuous Infusions: . sodium chloride    . sodium chloride 10 mL/hr at 08/16/20 0626  . heparin 850 Units/hr (08/16/20 0626)   PRN Meds: sodium chloride, acetaminophen, ALPRAZolam, ipratropium-albuterol, ondansetron (ZOFRAN) IV, oxyCODONE, sodium chloride flush   Vital Signs    Vitals:   08/15/20 2000 08/15/20 2132 08/15/20 2354 08/16/20 0410  BP: 118/60 106/63 108/71 122/76  Pulse: 66 71 62 73  Resp: 18  17 15   Temp: 97.7 F (36.5 C)  97.9 F (36.6 C) 97.9 F (36.6 C)  TempSrc: Oral  Oral Oral  SpO2: 100%  99% 100%  Weight:    63.8 kg  Height:    5\' 2"  (1.575 m)    Intake/Output Summary (Last 24 hours) at 08/16/2020 0700 Last data filed at 08/16/2020 0626 Gross per 24 hour  Intake 185.76 ml  Output 352 ml  Net -166.24 ml   Filed Weights   08/14/20 0508 08/15/20 0428 08/16/20 0410  Weight: 67.2 kg 66.4 kg 63.8 kg    Physical Exam   General: Elderly, NAD Neck: Negative for carotid bruits. No JVD Lungs:Clear to ausculation bilaterally.  Breathing is unlabored. Cardiovascular: RRR with S1 S2. No murmurs Abdomen: Soft, non-tender, non-distended. No obvious abdominal masses. Extremities: No edema. Radial pulses 2+ bilaterally Neuro: Alert and oriented to person and place. No  focal deficits. No facial asymmetry. MAE spontaneously. Psych: Responds to questions appropriately with normal affect.    Labs    Chemistry Recent Labs  Lab 08/12/20 1943 08/13/20 0011 08/14/20 0340 08/15/20 0015 08/16/20 0255  NA 124*   < > 131* 131* 135  K 3.9   < > 3.3* 4.1 4.2  CL 88*   < > 93* 99 104  CO2 25   < > 28 26 28   GLUCOSE 150*   < > 91 103* 90  BUN 46*   < > 56* 53* 30*  CREATININE 1.48*   < > 1.59* 1.30* 1.12*  CALCIUM 8.4*   < > 8.4* 8.4* 8.5*  PROT 7.1  --   --   --   --   ALBUMIN 2.8*  --   --   --   --   AST 31  --   --   --   --   ALT 24  --   --   --   --   ALKPHOS 93  --   --   --   --   BILITOT 0.9  --   --   --   --   GFRNONAA 38*   < > 35* 44* 53*  ANIONGAP 11   < > 10 6  3*   < > = values in this interval not displayed.     Hematology Recent Labs  Lab 08/14/20 0340 08/15/20 0015 08/16/20 0255  WBC 10.6* 10.6* 12.1*  RBC 4.84 4.25 4.16  HGB 13.6 12.2 11.7*  HCT 42.1 37.5 38.1  MCV 87.0 88.2 91.6  MCH 28.1 28.7 28.1  MCHC 32.3 32.5 30.7  RDW 14.4 14.4 14.6  PLT 226 206 199    Cardiac EnzymesNo results for input(s): TROPONINI in the last 168 hours. No results for input(s): TROPIPOC in the last 168 hours.   BNP Recent Labs  Lab 08/13/20 0011  BNP 1,089.8*     DDimer No results for input(s): DDIMER in the last 168 hours.   Radiology    No results found.  Telemetry    08/16/20 NSR with HR 60's - Personally Reviewed  ECG    No new tracing as of 08/16/20- Personally Reviewed  Cardiac Studies   Echocardiogram 08/06/2020 [facility]): - LV mildly dilated with mildly to moderately increased wall thickness. LVEF 30-35%. Atypical septal motion. Hypokinesis of anterolateral wall and mid inferolateral wall. LV diastolic function is indeterminate.  - RV normal in sizes. Normal systolic function.  - Left atrium mildltly to moderately dilated.  - Right atrium normal in size.  - No significant valvular disease.  Patient Profile      70 y.o. female with a history of chronic diastolic CHF with EF >75% on last Echo in 2018, prior stroke on Aspirin and Plavix, COPD, hypertension and CKD stage III. She was admitted to [facility] on 08/06/2020 with UTI and altered mental status. She subsequently developed chest pain and found to have a newly reduced LVEF down to 30-35%. Decision was made to transfer patient to [facility] for cardiac catheterization.  Assessment & Plan    1. New on systolic CHF: -Echocardiogram with LVEF at 30-35% with atypical septal motion. Hypokinesis of anterolateral wall and mid inferolateral wall. LV diastolic function is indeterminate. Previous echo in 2018 with EF at 75% -Placed on IV Lasix 40mg  BID however this has since been held  -Will consider starting ARB in the post cath setting given stabilization of renal function and BP's -Appears to be in PTA spironolactone 25mg >>plan to restart post cath  -Weight, 140lb>>down from 143lb on 08/13/20 -I&O, net negative 1.4L since admission  -Appears euvolemic on exam today   2. Demand Ischemia vs NSTEMI: -HsT peak at 369 at [facility]>>197>>164>>151 -Echocardiogram with new LV dysfunction with plans for [facility] for further ischemic evaluation  -Denies chest pain or other anginal symptoms -Continue ASA, Plavix, beta blocker, statin and IV Heparin  -Plan for cath today   3. Possible CAD: -Hx of CAD listed in chart however not mentioned in OP cardiology notes>>>reports remote cath with stent placement  -Continue ASA, statin, beta blocker -Plan for cath today   4. HTN: -Stable, 122/76>>108/71>>106/63 -Continue lopressor 12.5mg  BID  -PTA amlodipine has been held  -Will consider the addition of ARB in the post cath setting if renal function remains stable   5. HLD: -Last LDL, 128  -Continue Crestor>>up titrate to 40mg  QD  -Goal LDL <70  6. CKD Stage III: -Creatinine, 1.12 today with admission creatinine at 1.48>>1.59>>1.30 -Baseline appears to be  in the 1.5-1.6 06/2020 -Follow closely in the post cath setting  7. Hypokalemia: -Replaced per IM  -K stable today at 4.2   8. Tobacco Use: -Cessation strongly encouraged   9. DM2: -SSI for glucose control  -HbA1c, 6.0 -  Consider SGLT2   Other hospi per IM: -UTI -Metabolic encephalopathy -DM2 -Generalized weakness   Signed, Georgie Chard NP-C [facility] Pager: [phone] 08/16/2020, 7:00 AM     For questions or updates, please contact   Please consult [email or web address] for contact info under Cardiology/STEMI.  Patient seen, examined. Available data reviewed. Agree with findings, assessment, and plan as outlined by Georgie Chard, NP-C.  The patient is independently interviewed and examined.  She is an elderly appearing woman in no distress.  She is not able to provide any meaningful history.  JVP is normal, lungs are clear, heart is regular rate and rhythm with no murmur gallop, right radial cath site is clear with a TR band still in place, abdomen is soft and nontender, extremities have no edema.  Cardiac catheterization performed this morning shows mild to moderate diffuse nonobstructive CAD with no culprit lesion or targets for PCI.  The patient was noted to have severe LV dysfunction at presentation.  This is new from previous as she has had vigorous LV systolic function.  Troponin is mildly elevated and the patient presented with chest pain.  Her clinical findings are suggestive of acute Takotsubo syndrome.  Recommend medical therapy.  She will likely require skilled nursing at discharge.  Continue low-dose beta-blocker as tolerated.  We will follow-up to assess her clinical progress tomorrow.  I am going to order a limited 2D echocardiogram for LV function assessment tomorrow.  Tonny Bollman, M.D. 08/16/2020 1:06 PM

## 2020-08-16 NOTE — Interval H&P Note (Signed)
History and Physical Interval Note:  08/16/2020 7:38 AM  Mariah White  has presented today for surgery, with the diagnosis of acute HFrEF and chest pain.  The various methods of treatment have been discussed with the patient and family. After consideration of risks, benefits and other options for treatment, the patient has consented to  Procedure(s): LEFT HEART CATH AND CORONARY ANGIOGRAPHY (N/A) as a surgical intervention.  The patient's history has been reviewed, patient examined, no change in status, stable for surgery.  I have reviewed the patient's chart and labs.  Questions were answered to the patient's satisfaction.    Cath Lab Visit (complete for each Cath Lab visit)  Clinical Evaluation Leading to the Procedure:   ACS: Yes.    Non-ACS:  N/A  [name]

## 2020-08-16 NOTE — Progress Notes (Incomplete)
  Echocardiogram 2D Echocardiogram has been performed.  Shirlean Kelly 08/16/2020, 2:43 PM

## 2020-08-16 NOTE — TOC Progression Note (Addendum)
Transition of Care [facility]) - Progression Note    Patient Details  Name: Mariah White MRN: [id number] Date of Birth: [birth year]  Transition of Care [facility]) CM/SW Contact  Terrial Rhodes, LCSWA Phone Number: 08/16/2020, 12:34 PM  Clinical Narrative:     Update- CSW spoke with patients son Mariah White. Patient comes from home with son Mariah White. Mariah White confirmed he would like for CSW to fax out patient for SNF placement. Patients son gave CSW permission to fax out initial referral near [place] area. Patient has received both covid vaccines as well as booster. Mariah White confirmed that he is the primary contact for patient. CSW will continue to follow and assist with discharge planning needs.  CSW called Mariah White to follow up on APS report. Mariah White confirmed that point of contact would be Mariah White APS supervisor. Tele# [phone] EX: [phone]. CSW called Mariah White and left voicemail. CSW awaiting callback. CSW tried to call patients son Mariah White to speak with him about possible SNF placement for patient. CSW left voicemail. CSW awaiting callback. CSW will continue to follow and assist with discharge planning needs.         Expected Discharge Plan and Services                                                 Social Determinants of Health (SDOH) Interventions    Readmission Risk Interventions No flowsheet data found.

## 2020-08-16 NOTE — Evaluation (Addendum)
OT Cancellation Note  Patient Details Name: Mariah White MRN: [id number] DOB: Nov 15, 1950   Cancelled Treatment:    Reason Eval/Treat Not Completed: Patient at procedure or test/ unavailable (cardiac cath). Will follow-up for OT treatment as schedule permits.  Ignacia Palma, OTR/L Acute Rehab Services Pager [phone] Office [phone]     Evette Georges 08/16/2020, 8:21 AM

## 2020-08-16 NOTE — Progress Notes (Signed)
Pt taken to cath lab

## 2020-08-16 NOTE — Progress Notes (Signed)
Pt returned from cath lab.

## 2020-08-16 NOTE — Progress Notes (Signed)
PT Cancellation Note  Patient Details Name: Mariah White MRN: [id number] DOB: 23-Oct-1950   Cancelled Treatment:    Reason Eval/Treat Not Completed: Patient at procedure or test/unavailable (cardiac cath). Will follow-up for PT treatment as schedule permits.  Ina Homes, PT, DPT Acute Rehabilitation Services  Pager [phone] Office [phone]  Malachy Chamber 08/16/2020, 8:04 AM

## 2020-08-16 NOTE — Progress Notes (Signed)
PROGRESS NOTE    Mariah White  LYY:[id number] DOB: 1950/08/21 DOA: 08/12/2020 PCP: [facility]    Brief Narrative:  Mariah White is a 70 year old female with past medical history significant for essential hypertension, chronic diastolic congestive heart failure, CAD, prior CVA, COPD on 2 L nasal cannula oxygen at baseline presents as a transfer from [facility] for chest pain.  Patient had been hospitalized at [facility] from 5/6-08/2010; after initially presenting following fall at home with complaints of left hip pain.  She reports at that time she was reaching for cigarettes while in bed and fell without loss of consciousness.  CT scan of her head without contrast did not show any acute intracranial abnormalities and x-rays pelvis with no acute fractures.  While in the ED at [facility] ER, patient reported to complain of some dysuria and was noted to be with some mild hypertension.  Hypotension resolved with IV fluids and she was started on IV antibiotics for urinary tract infection with ceftriaxone.  During her hospital stay, patient has been noted to be intermittently confused at times with plan to transfer patient to a rehab facility due to ongoing weakness.  Patient then started to complain of chest pain 2-3 days prior to transfer with localization in the middle of her chest with pressure-like feeling.  Also associated with shortness of breath with exertion, nausea/vomiting and diaphoresis. Chest pain was reportedly brought to the provider's attention the day prior to transfer and EKG was found to have concern for Q waves.  High-sensitivity troponins were noted to be elevated 325->369.  Echocardiogram was obtained which showed EF of 30 to 35%.  Patient has been started on heparin IV as well as evaluated by cardiology who recommended cardiac cath.  Case was discussed with Dr. Wyline Mood of cardiology here at [facility] as well as Dr. Ophelia Charter of the [facility].  Patient  accepted to a cardiac telemetry bed as inpatient.    Assessment & Plan:   Principal Problem:   Elevated troponin Active Problems:   COPD (chronic obstructive pulmonary disease) (HCC)   CAD (coronary artery disease)   Essential hypertension   Pressure injury of skin   Acute HFrEF (heart failure with reduced ejection fraction) (HCC)   Leukocytosis   NSTEMI (non-ST elevated myocardial infarction) (HCC)   Acute metabolic encephalopathy   Hyponatremia   Hyperlipidemia with target LDL less than 70   NSTEMI Systolic congestive heart failure, acute, new diagnosis Patient presenting from [facility] with chest pain.  TTE ED at outside hospital notable for EF 30-35%.  BNP 1089.  On arrival, high-sensitivity troponin elevated 197>164>151.  EKG with NSR, rate 76, QTc 499, T wave inversions in 1/aVL, lead II, V4-V6; there is change in comparison to EKG dated 05/04/2017 in lead II, V4-V6, otherwise unchanged.  Underwent left heart catheterization on 08/16/2020 with nonobstructive CAD, heparin drip now discontinued. --Cardiology following, appreciate assistance --Metoprolol tartrate 12.5 mg p.o. twice daily --Holding ACE inhibitor/ARB/ARNI given renal function and borderline hypotension --Strict I's and O's and daily weights --Continue monitor on telemetry  Chronic respiratory failure with hypoxia, 2L [place] baseline COPD --Continue supplemental oxygen, maintain SPO2 > 88%, currently on 2L [place] which is her home dose --DuoNebs as needed  Hyponatremia On transfer, sodium noted to be 124.  Suspect volume overload in the setting of decompensated congestive heart failure.  Urine sodium 73, urine osmolality 430, serum osmolality 289.  --Na 124>126>131>131>135 --BMP in the a.m.  Hypokalemia: resolved Potassium 4.2  today --Repeat electrolytes in a.m. to include magnesium  Recent UTI Patient was treated with IV Rocephin during her most recent hospitalization.  Leukocytosis now resolved.  Type 2 diabetes  mellitus Hemoglobin A1c 6.0, well controlled.  On Ozempic and Januvia 25 mg p.o. daily outpatient. --SSI for coverage --CBGs QAC/HS  CKD stage IIIb Baseline creatinine 1.5-1.6.  Creatinine on admission to [facility] 2.10. --Cr 1.48>1.49>1.59>1.30 --Cardiology holding IV furosemide as above --BMP daily  Hyperlipidemia HDL 53, LDL 128. --Continue Crestor 40 mg p.o. daily  CAD  --Aspirin 81 mg p.o. daily, Plavix 75 mg p.o. daily, statin  Depression/anxiety --Abilify 5 mg p.o. nightly --Duloxetine 60 mg p.o. daily --Fluoxetine 40 mg p.o. daily  Tobacco use disorder Continues to endorse cigarette use.  Counseled on need for complete cessation. --Nicotine patch  Generalized weakness Records on transfer report patient has been having difficulty walking at baseline with multiple falls.  During her hospitalization at [facility] ER, patient was noted to have continued weakness for which rehab had initially been recommended. --Continue PT/OT efforts while inpatient --TOC for placement  DVT prophylaxis: Lovenox   Code Status: Full Code Family Communication: No family present at bedside this morning  Disposition Plan:  Level of care: Telemetry Cardiac Status is: Inpatient  Remains inpatient appropriate because:Persistent severe electrolyte disturbances, Ongoing diagnostic testing needed not appropriate for outpatient work up, Unsafe d/c plan, IV treatments appropriate due to intensity of illness or inability to take PO and Inpatient level of care appropriate due to severity of illness   Dispo: The patient is from: Home              Anticipated d/c is to: SNF              Patient currently is not medically stable to d/c.   Difficult to place patient No   Consultants:   Cardiology  Procedures:   None  Antimicrobials:   Ceftriaxone: completed course at OSH   Subjective: Patient seen and examined at bedside, resting comfortably.  Lying in bed.  Just returned from left heart  catheterization this morning.  No family present.  No specific complaints at this time. Denies headache, no dizziness, no chest pain, no palpitations, no fever/chills/night sweats, no nausea/vomiting/diarrhea, no abdominal pain, no cough/congestion.  Nursing reports occasional confusion overnight, otherwise no other acute issues/events per nursing staff.  Objective: Vitals:   08/16/20 0759 08/16/20 0804 08/16/20 0830 08/16/20 0845  BP: 116/64 109/61 109/66 110/86  Pulse: 100 65 62 (!) 59  Resp: 14 14 16    Temp:   98 F (36.7 C)   TempSrc:   Oral   SpO2: (!) 0% 100% 100% 97%  Weight:      Height:        Intake/Output Summary (Last 24 hours) at 08/16/2020 1012 Last data filed at 08/16/2020 0626 Gross per 24 hour  Intake 185.76 ml  Output 352 ml  Net -166.24 ml   Filed Weights   08/14/20 0508 08/15/20 0428 08/16/20 0410  Weight: 67.2 kg 66.4 kg 63.8 kg    Examination:  General exam: Appears calm and comfortable Respiratory system: Slightly decreased breath sounds bilateral bases, otherwise clear to auscultation bilaterally, no wheezing/crackles, normal respiratory effort, on 2 L [place] (2L at home) Cardiovascular system: S1 & S2 heard, RRR. No JVD, murmurs, rubs, gallops or clicks. No pedal edema. Gastrointestinal system: Abdomen is nondistended, soft and nontender. No organomegaly or masses felt. Normal bowel sounds heard. Central nervous system: Alert and oriented to  person/place/time/situation. No focal neurological deficits. Extremities: Symmetric 5 x 5 power. Skin: No rashes, lesions or ulcers Psychiatry: Judgement and insight appear poor. Mood & affect appropriate.     Data Reviewed: I have personally reviewed following labs and imaging studies  CBC: Recent Labs  Lab 08/12/20 1943 08/13/20 0011 08/14/20 0340 08/15/20 0015 08/16/20 0255  WBC 11.1* 11.3* 10.6* 10.6* 12.1*  HGB 14.3 14.3 13.6 12.2 11.7*  HCT 42.6 43.6 42.1 37.5 38.1  MCV 85.5 85.8 87.0 88.2 91.6  PLT  297 318 226 206 199   Basic Metabolic Panel: Recent Labs  Lab 08/12/20 1943 08/13/20 0011 08/14/20 0340 08/15/20 0015 08/16/20 0255  NA 124* 126* 131* 131* 135  K 3.9 3.8 3.3* 4.1 4.2  CL 88* 90* 93* 99 104  CO2 25 27 28 26 28   GLUCOSE 150* 149* 91 103* 90  BUN 46* 48* 56* 53* 30*  CREATININE 1.48* 1.49* 1.59* 1.30* 1.12*  CALCIUM 8.4* 8.5* 8.4* 8.4* 8.5*  MG  --   --  1.9 2.0 2.1   GFR: Estimated Creatinine Clearance: 41 mL/min (A) (by C-G formula based on SCr of 1.12 mg/dL (H)). Liver Function Tests: Recent Labs  Lab 08/12/20 1943  AST 31  ALT 24  ALKPHOS 93  BILITOT 0.9  PROT 7.1  ALBUMIN 2.8*   No results for input(s): LIPASE, AMYLASE in the last 168 hours. No results for input(s): AMMONIA in the last 168 hours. Coagulation Profile: No results for input(s): INR, PROTIME in the last 168 hours. Cardiac Enzymes: No results for input(s): CKTOTAL, CKMB, CKMBINDEX, TROPONINI in the last 168 hours. BNP (last 3 results) No results for input(s): PROBNP in the last 8760 hours. HbA1C: No results for input(s): HGBA1C in the last 72 hours. CBG: Recent Labs  Lab 08/15/20 0755 08/15/20 1210 08/15/20 1652 08/15/20 2116 08/16/20 0634  GLUCAP 95 99 105* 148* 93   Lipid Profile: No results for input(s): CHOL, HDL, LDLCALC, TRIG, CHOLHDL, LDLDIRECT in the last 72 hours. Thyroid Function Tests: Recent Labs    08/14/20 0340  TSH 1.358   Anemia Panel: No results for input(s): VITAMINB12, FOLATE, FERRITIN, TIBC, IRON, RETICCTPCT in the last 72 hours. Sepsis Labs: No results for input(s): PROCALCITON, LATICACIDVEN in the last 168 hours.  No results found for this or any previous visit (from the past 240 hour(s)).       Radiology Studies: CARDIAC CATHETERIZATION  Result Date: 08/16/2020 Conclusions: 1. Mild to moderate, non-obstructive coronary artery disease. 2. Normal left ventricular filling pressure. Recommendations: 1. Medical therapy of nonischemic  cardiomyopathy and risk factor modification to prevent progression of coronary artery disease.  LVEDP suggests that the patient is euvolemic. 08/18/2020, MD [facility]        Scheduled Meds: . ARIPiprazole  5 mg Oral QHS  . aspirin EC  81 mg Oral Daily  . clopidogrel  75 mg Oral Daily  . DULoxetine  60 mg Oral Daily  . [START ON 08/17/2020] enoxaparin (LOVENOX) injection  40 mg Subcutaneous Q24H  . FLUoxetine  40 mg Oral Daily  . insulin aspart  0-9 Units Subcutaneous TID WC  . LORazepam  1 mg Intravenous Once  . metoprolol tartrate  12.5 mg Oral BID  . nicotine  14 mg Transdermal Daily  . rosuvastatin  40 mg Oral Daily  . sodium chloride flush  3 mL Intravenous Q12H   Continuous Infusions: . sodium chloride 50 mL/hr at 08/16/20 0834  . sodium chloride  LOS: 4 days    Time spent: 36 minutes spent on chart review, discussion with nursing staff, consultants, updating family and interview/physical exam; more than 50% of that time was spent in counseling and/or coordination of care.    Alvira Philips [place], DO [facility] Available via Epic secure chat 7am-7pm After these hours, please refer to coverage provider listed on [email or web address] 08/16/2020, 10:12 AM

## 2020-08-16 NOTE — Progress Notes (Signed)
ANTICOAGULATION CONSULT NOTE  Pharmacy Consult for IV Heparin Indication: chest pain/ACS  Allergies  Allergen Reactions  . Tape Other (See Comments)    SKIN IS THIN- WILL TEAR AND BRUISE EASILY!! PAPER TAPE IS TOLERATED.  Marland Kitchen Penicillins Itching    Has patient had a PCN reaction causing immediate rash, facial/tongue/throat swelling, SOB or lightheadedness with hypotension: Yes Has patient had a PCN reaction causing severe rash involving mucus membranes or skin necrosis: No Has patient had a PCN reaction that required hospitalization: No Has patient had a PCN reaction occurring within the last 10 years: No If all of the above answers are "NO", then may proceed with Cephalosporin use.     Patient Measurements: Height: 5\' 2"  (157.5 cm) Weight: 63.8 kg (140 lb 9.6 oz) IBW/kg (Calculated) : 50.1 Heparin Dosing Weight: 61.4 kg (based on ht from 11/21)  Vital Signs: Temp: 97.9 F (36.6 C) (05/16 0410) Temp Source: Oral (05/16 0410) BP: 122/76 (05/16 0410) Pulse Rate: 73 (05/16 0410)  Labs: Recent Labs    08/14/20 0340 08/14/20 1355 08/15/20 0015 08/15/20 0731 08/16/20 0255  HGB 13.6  --  12.2  --  11.7*  HCT 42.1  --  37.5  --  38.1  PLT 226  --  206  --  199  HEPARINUNFRC 0.25*   < > 0.45 0.50 0.32  CREATININE 1.59*  --  1.30*  --  1.12*   < > = values in this interval not displayed.    Estimated Creatinine Clearance: 41 mL/min (A) (by C-G formula based on SCr of 1.12 mg/dL (H)).   Assessment: 70 yr old female transferred from OSH on IV heparin for R/O ACS. Pt was on no anticoagulant PTA to OSH. Cardiac cath planned for Monday. Plans are for cath on Monday  Heparin level at goal this AM, then turned off for cath lab. CBC stable, no s/sx bleeding reported.   Goal of Therapy:  Heparin level 0.3-0.7 units/ml Monitor platelets by anticoagulation protocol: Yes   Plan:  F/u plans for heparin after cath lab.  Wednesday, Reece Leader, BCCP Clinical Pharmacist   08/16/2020 7:34 AM   [facility] pharmacy phone numbers are listed on [email or web address]

## 2020-08-16 NOTE — Progress Notes (Signed)
RE: Mariah White. Methot  Date of Birth: 07/31/1950  Date: 08/16/2020  To Whom It May Concern:  Please be advised that the above-named patient will require a short-term nursing home stay - anticipated 30 days or less for rehabilitation and strengthening. The plan is for return home.

## 2020-08-16 NOTE — NC FL2 (Addendum)
[place] MEDICAID FL2 LEVEL OF CARE SCREENING TOOL     IDENTIFICATION  Patient Name: Mariah White Birthdate: 02/03/1951 Sex: female Admission Date (Current Location): 08/12/2020  [facility] and [place] Number:  Producer, television/film/video and Address:  [facility] [place], [address] [place], [place] [address]      Provider Number: [id number]  Attending Physician Name and Address:  [place], Eric J, DO  Relative Name and Phone Number:  Jomarie Longs [phone]    Current Level of Care: Hospital Recommended Level of Care: Skilled Nursing Facility Prior Approval Number:    Date Approved/Denied:   PASRR Number: [id number]  Discharge Plan: SNF    Current Diagnoses: Patient Active Problem List   Diagnosis Date Noted  . Hyponatremia   . Hyperlipidemia with target LDL less than 70   . Elevated troponin 08/12/2020  . Pressure injury of skin 08/12/2020  . Acute HFrEF (heart failure with reduced ejection fraction) (HCC) 08/12/2020  . Leukocytosis 08/12/2020  . NSTEMI (non-ST elevated myocardial infarction) (HCC) 08/12/2020  . Acute metabolic encephalopathy 08/12/2020  . CHF (congestive heart failure) (HCC) 05/03/2017  . COPD (chronic obstructive pulmonary disease) (HCC) 05/03/2017  . CAD (coronary artery disease) 05/03/2017  . Essential hypertension 05/03/2017  . Stroke (HCC) 05/03/2017  . Aspiration pneumonia (HCC) 05/03/2017  . Sepsis (HCC) 05/03/2017  . AKI (acute kidney injury) (HCC) 05/03/2017  . Major depression 07/01/2014    Orientation RESPIRATION BLADDER Height & Weight     Self,Place  O2 (Nasal Cannula 1 liter) Incontinent,External catheter (External Urinary Catheter) Weight: 140 lb 9.6 oz (63.8 kg) Height:  5\' 2"  (157.5 cm)  BEHAVIORAL SYMPTOMS/MOOD NEUROLOGICAL BOWEL NUTRITION STATUS      Incontinent Diet (See Discharge Summary)  AMBULATORY STATUS COMMUNICATION OF NEEDS Skin   Limited Assist Verbally Other (Comment) (Ecchymosis, PI sacrum stage  2, PRN dressing in place)                       Personal Care Assistance Level of Assistance  Bathing,Feeding,Dressing Bathing Assistance: Limited assistance Feeding assistance: Independent Dressing Assistance: Limited assistance     Functional Limitations Info  Sight,Hearing,Speech     Speech Info: Adequate    SPECIAL CARE FACTORS FREQUENCY  PT (By licensed PT),OT (By licensed OT)     PT Frequency: 5x min weekly OT Frequency: 5x min weekly            Contractures Contractures Info: Not present    Additional Factors Info  Code Status,Allergies,Psychotropic,Insulin Sliding Scale Code Status Info: FULL Allergies Info: Tape,Penicillins Psychotropic Info: ARIPiprazole (ABILIFY) tablet 5 mg daily at bedtime,FLUoxetine (PROZAC) capsule 40 mg daily,LORazepam (ATIVAN) injection 1 mg once Insulin Sliding Scale Info: insulin aspart (novoLOG) injection 0-9 Units 3 times daily with meals       Current Medications (08/16/2020):  This is the current hospital active medication list Current Facility-Administered Medications  Medication Dose Route Frequency Provider Last Rate Last Admin  . 0.9 %  sodium chloride infusion   Intravenous Continuous End, Christopher, MD 50 mL/hr at 08/16/20 0834 Rate Change at 08/16/20 0834  . 0.9 %  sodium chloride infusion  250 mL Intravenous PRN End, 08/18/20, MD      . acetaminophen (TYLENOL) tablet 650 mg  650 mg Oral Q4H PRN End, Cristal Deer, MD      . ALPRAZolam Cristal Deer) tablet 0.25 mg  0.25 mg Oral TID PRN End, Prudy Feeler, MD   0.25 mg at 08/15/20 2132  . ARIPiprazole (  ABILIFY) tablet 5 mg  5 mg Oral QHS End, Christopher, MD   5 mg at 08/15/20 2132  . aspirin EC tablet 81 mg  81 mg Oral Daily End, Christopher, MD   81 mg at 08/15/20 1009  . clopidogrel (PLAVIX) tablet 75 mg  75 mg Oral Daily End, Christopher, MD   75 mg at 08/16/20 0603  . DULoxetine (CYMBALTA) DR capsule 60 mg  60 mg Oral Daily End, Christopher, MD   60 mg at 08/16/20 1056   . [START ON 08/17/2020] enoxaparin (LOVENOX) injection 40 mg  40 mg Subcutaneous Q24H End, Christopher, MD      . FLUoxetine (PROZAC) capsule 40 mg  40 mg Oral Daily End, Christopher, MD   40 mg at 08/16/20 1056  . hydrALAZINE (APRESOLINE) injection 10 mg  10 mg Intravenous Q20 Min PRN End, Christopher, MD      . insulin aspart (novoLOG) injection 0-9 Units  0-9 Units Subcutaneous TID WC End, Christopher, MD   1 Units at 08/13/20 1706  . ipratropium-albuterol (DUONEB) 0.5-2.5 (3) MG/3ML nebulizer solution 3 mL  3 mL Nebulization Q6H PRN End, Cristal Deer, MD   3 mL at 08/16/20 1049  . labetalol (NORMODYNE) injection 10 mg  10 mg Intravenous Q10 min PRN End, Cristal Deer, MD      . LORazepam (ATIVAN) injection 1 mg  1 mg Intravenous Once End, Christopher, MD      . metoprolol tartrate (LOPRESSOR) tablet 12.5 mg  12.5 mg Oral BID End, Christopher, MD   12.5 mg at 08/16/20 1057  . nicotine (NICODERM CQ - dosed in mg/24 hours) patch 14 mg  14 mg Transdermal Daily End, Christopher, MD   14 mg at 08/15/20 1010  . ondansetron (ZOFRAN) injection 4 mg  4 mg Intravenous Q6H PRN End, Cristal Deer, MD   4 mg at 08/12/20 2151  . oxyCODONE (Oxy IR/ROXICODONE) immediate release tablet 5 mg  5 mg Oral Q6H PRN End, Cristal Deer, MD   5 mg at 08/15/20 2019  . rosuvastatin (CRESTOR) tablet 40 mg  40 mg Oral Daily End, Christopher, MD   40 mg at 08/16/20 1056  . sodium chloride flush (NS) 0.9 % injection 3 mL  3 mL Intravenous Q12H End, Christopher, MD      . sodium chloride flush (NS) 0.9 % injection 3 mL  3 mL Intravenous PRN End, Cristal Deer, MD         Discharge Medications: Please see discharge summary for a list of discharge medications.  Relevant Imaging Results:  Relevant Lab Results:   Additional Information [id number]  Terrial Rhodes, LCSWA

## 2020-08-17 DIAGNOSIS — N183 Chronic kidney disease, stage 3 unspecified: Secondary | ICD-10-CM | POA: Diagnosis not present

## 2020-08-17 DIAGNOSIS — R778 Other specified abnormalities of plasma proteins: Secondary | ICD-10-CM | POA: Diagnosis not present

## 2020-08-17 DIAGNOSIS — E785 Hyperlipidemia, unspecified: Secondary | ICD-10-CM | POA: Diagnosis not present

## 2020-08-17 DIAGNOSIS — I5021 Acute systolic (congestive) heart failure: Secondary | ICD-10-CM | POA: Diagnosis not present

## 2020-08-17 DIAGNOSIS — I10 Essential (primary) hypertension: Secondary | ICD-10-CM | POA: Diagnosis not present

## 2020-08-17 LAB — BASIC METABOLIC PANEL
Anion gap: 4 — ABNORMAL LOW (ref 5–15)
BUN: 19 mg/dL (ref 8–23)
CO2: 26 mmol/L (ref 22–32)
Calcium: 8.3 mg/dL — ABNORMAL LOW (ref 8.9–10.3)
Chloride: 106 mmol/L (ref 98–111)
Creatinine, Ser: 0.93 mg/dL (ref 0.44–1.00)
GFR, Estimated: >60 mL/min (ref >60)
Glucose, Bld: 96 mg/dL (ref 70–99)
Potassium: 4 mmol/L (ref 3.5–5.1)
Sodium: 136 mmol/L (ref 135–145)

## 2020-08-17 LAB — CBC
HCT: 34.5 % — ABNORMAL LOW (ref 36.0–46.0)
Hemoglobin: 10.8 g/dL — ABNORMAL LOW (ref 12.0–15.0)
MCH: 28.7 pg (ref 26.0–34.0)
MCHC: 31.3 g/dL (ref 30.0–36.0)
MCV: 91.8 fL (ref 80.0–100.0)
Platelets: 194 10*3/uL (ref 150–400)
RBC: 3.76 MIL/uL — ABNORMAL LOW (ref 3.87–5.11)
RDW: 14.6 % (ref 11.5–15.5)
WBC: 8.3 10*3/uL (ref 4.0–10.5)
nRBC: 0 % (ref 0.0–0.2)

## 2020-08-17 LAB — GLUCOSE, CAPILLARY
Glucose-Capillary: 97 mg/dL (ref 70–99)
Glucose-Capillary: 122 mg/dL — ABNORMAL HIGH (ref 70–99)
Glucose-Capillary: 109 mg/dL — ABNORMAL HIGH (ref 70–99)
Glucose-Capillary: 126 mg/dL — ABNORMAL HIGH (ref 70–99)

## 2020-08-17 MED ORDER — SPIRONOLACTONE 25 MG PO TABS
25.0000 mg | ORAL_TABLET | Freq: Every day | ORAL | Status: DC
  Administered 2020-08-17 – 2020-08-20 (×4): 25 mg via ORAL
  Filled 2020-08-17 (×4): qty 1

## 2020-08-17 NOTE — Progress Notes (Addendum)
Progress Note  Patient Name: Mariah ColtMary A Pullin Date of Encounter: 08/17/2020  [facility] Cardiologist: Dina RichBranch, Jonathan, MD   Subjective   Patient is eating breakfast this morning, oriented to place/person/time. She states she is feeling better. She is aware she had cardiac cath yesterday. She denied any pain of her right wrist. She states he had some pain of her chest last week but all has resolved.  She denies any shortness of breath, orthopnea, leg edema.  She continues to have mild dysuria, denied any fever or chills.   Inpatient Medications    Scheduled Meds: . ARIPiprazole  5 mg Oral QHS  . aspirin EC  81 mg Oral Daily  . clopidogrel  75 mg Oral Daily  . DULoxetine  60 mg Oral Daily  . enoxaparin (LOVENOX) injection  40 mg Subcutaneous Q24H  . FLUoxetine  40 mg Oral Daily  . insulin aspart  0-9 Units Subcutaneous TID WC  . LORazepam  1 mg Intravenous Once  . metoprolol tartrate  12.5 mg Oral BID  . nicotine  14 mg Transdermal Daily  . rosuvastatin  40 mg Oral Daily  . sodium chloride flush  3 mL Intravenous Q12H   Continuous Infusions: . sodium chloride     PRN Meds: sodium chloride, acetaminophen, ALPRAZolam, ipratropium-albuterol, ondansetron (ZOFRAN) IV, oxyCODONE, sodium chloride flush   Vital Signs    Vitals:   08/16/20 2011 08/16/20 2140 08/17/20 0003 08/17/20 0446  BP: 124/66 126/78 134/65 (!) 115/59  Pulse: 66 65 68 68  Resp: 16  16 16   Temp: 98 F (36.7 C)  98.2 F (36.8 C) 97.9 F (36.6 C)  TempSrc: Oral  Oral Oral  SpO2: 97%  99% 98%  Weight:    65.9 kg  Height:        Intake/Output Summary (Last 24 hours) at 08/17/2020 0905 Last data filed at 08/16/2020 2040 Gross per 24 hour  Intake 248.73 ml  Output 200 ml  Net 48.73 ml   Last 3 Weights 08/17/2020 08/16/2020 08/15/2020  Weight (lbs) 145 lb 4.5 oz 140 lb 9.6 oz 146 lb 6.2 oz  Weight (kg) 65.9 kg 63.776 kg 66.4 kg  Some encounter information is confidential and restricted. Go to Review  Flowsheets activity to see all data.      Telemetry    Sinus rhythm with ventricular rate of 60s, occasional PVCs- Personally Reviewed  ECG    No new tracing today - Personally Reviewed  Physical Exam   GEN: No acute distress.  Eating breakfast, sitting in bed Neck: No JVD Cardiac: RRR, no murmurs, rubs, or gallops.  Respiratory: Clear to auscultation bilaterally.  On room air GI: Soft, nontender, non-distended  GU: Suction catheter in place, urine appears concentrated MS:  Trace edema bilateral lower extremity; No deformity. Neuro:  Alert and oriented x3, carry conversation appropriately, slowed mentation Psych: Normal affect  Right wrist with dressing in place, small bruise noted, pulse +, no large hematoma/drainage/tenderness   Labs    High Sensitivity Troponin:   Recent Labs  Lab 08/12/20 1943 08/12/20 2115 08/13/20 0011  TROPONINIHS 197* 164* 151*      Chemistry Recent Labs  Lab 08/12/20 1943 08/13/20 0011 08/15/20 0015 08/16/20 0255 08/17/20 0219  NA 124*   < > 131* 135 136  K 3.9   < > 4.1 4.2 4.0  CL 88*   < > 99 104 106  CO2 25   < > 26 28 26   GLUCOSE 150*   < >  103* 90 96  BUN 46*   < > 53* 30* 19  CREATININE 1.48*   < > 1.30* 1.12* 0.93  CALCIUM 8.4*   < > 8.4* 8.5* 8.3*  PROT 7.1  --   --   --   --   ALBUMIN 2.8*  --   --   --   --   AST 31  --   --   --   --   ALT 24  --   --   --   --   ALKPHOS 93  --   --   --   --   BILITOT 0.9  --   --   --   --   GFRNONAA 38*   < > 44* 53* >60  ANIONGAP 11   < > 6 3* 4*   < > = values in this interval not displayed.     Hematology Recent Labs  Lab 08/15/20 0015 08/16/20 0255 08/17/20 0219  WBC 10.6* 12.1* 8.3  RBC 4.25 4.16 3.76*  HGB 12.2 11.7* 10.8*  HCT 37.5 38.1 34.5*  MCV 88.2 91.6 91.8  MCH 28.7 28.1 28.7  MCHC 32.5 30.7 31.3  RDW 14.4 14.6 14.6  PLT 206 199 194    BNP Recent Labs  Lab 08/13/20 0011  BNP 1,089.8*     DDimer No results for input(s): DDIMER in the last 168  hours.   Radiology    CARDIAC CATHETERIZATION  Result Date: 08/16/2020 Conclusions: 1. Mild to moderate, non-obstructive coronary artery disease. 2. Normal left ventricular filling pressure. Recommendations: 1. Medical therapy of nonischemic cardiomyopathy and risk factor modification to prevent progression of coronary artery disease.  LVEDP suggests that the patient is euvolemic. Mariah Kendall, MD [facility]   ECHOCARDIOGRAM LIMITED  Result Date: 08/16/2020    ECHOCARDIOGRAM LIMITED REPORT   Patient Name:   Mariah White Date of Exam: 08/16/2020 Medical Rec #:  [id number]     Height:       62.0 in Accession #:    [id number]    Weight:       140.6 lb Date of Birth:  [birth year]    BSA:          1.646 m Patient Age:    69 years      BP:           105/55 mmHg Patient Gender: F             HR:           64 bpm. Exam Location:  Inpatient Procedure: 2D Echo, Cardiac Doppler and Color Doppler Indications:    CHF  History:        Patient has prior history of Echocardiogram examinations, most                 recent 08/14/2016. CAD, COPD and Stroke; Risk Factors:Current                 Smoker and Hypertension.  Sonographer:    Shirlean Kelly Referring Phys: [phone] Mikiala Fugett IMPRESSIONS  1. Left ventricular ejection fraction, by estimation, is 65 to 70%. The left ventricle has normal function. Left ventricular diastolic parameters are consistent with Grade I diastolic dysfunction (impaired relaxation).  2. Right ventricular systolic function is normal. The right ventricular size is normal.  3. Left atrial size was mildly dilated.  4. Trivial mitral valve regurgitation.  5. The aortic valve is normal in structure. Aortic valve regurgitation is  not visualized.  6. The inferior vena cava is normal in size with greater than 50% respiratory variability, suggesting right atrial pressure of 3 mmHg. FINDINGS  Left Ventricle: Left ventricular ejection fraction, by estimation, is 65 to 70%. The left ventricle has  normal function. Left ventricular diastolic parameters are consistent with Grade I diastolic dysfunction (impaired relaxation). Right Ventricle: The right ventricular size is normal. Right ventricular systolic function is normal. Left Atrium: Left atrial size was mildly dilated. Right Atrium: Right atrial size was normal in size. Pericardium: Trivial pericardial effusion is present. Mitral Valve: Mild mitral annular calcification. Trivial mitral valve regurgitation. Tricuspid Valve: The tricuspid valve is normal in structure. Tricuspid valve regurgitation is trivial. Aortic Valve: The aortic valve is normal in structure. Aortic valve regurgitation is not visualized. Aortic valve mean gradient measures 4.0 mmHg. Aortic valve peak gradient measures 8.1 mmHg. Aortic valve area, by VTI measures 2.45 cm. Pulmonic Valve: The pulmonic valve was normal in structure. Pulmonic valve regurgitation is mild. Aorta: The aortic root and ascending aorta are structurally normal, with no evidence of dilitation. Venous: The inferior vena cava is normal in size with greater than 50% respiratory variability, suggesting right atrial pressure of 3 mmHg. IAS/Shunts: No atrial level shunt detected by color flow Doppler. LEFT VENTRICLE PLAX 2D LVIDd:         4.60 cm  Diastology LVIDs:         3.40 cm  LV e' medial:    5.55 cm/s LV PW:         0.90 cm  LV E/e' medial:  14.3 LV IVS:        0.90 cm  LV e' lateral:   6.31 cm/s LVOT diam:     2.00 cm  LV E/e' lateral: 12.6 LV SV:         71 LV SV Index:   43 LVOT Area:     3.14 cm  RIGHT VENTRICLE            IVC RV Basal diam:  2.80 cm    IVC diam: 1.00 cm RV S prime:     9.03 cm/s TAPSE (M-mode): 1.6 cm LEFT ATRIUM             Index       RIGHT ATRIUM          Index LA diam:        3.40 cm 2.07 cm/m  RA Area:     9.60 cm LA Vol (A2C):   44.5 ml 27.04 ml/m RA Volume:   19.90 ml 12.09 ml/m LA Vol (A4C):   57.0 ml 34.63 ml/m LA Biplane Vol: 52.3 ml 31.78 ml/m  AORTIC VALVE AV Area (Vmax):     2.41 cm AV Area (Vmean):   2.31 cm AV Area (VTI):     2.45 cm AV Vmax:           142.00 cm/s AV Vmean:          92.800 cm/s AV VTI:            0.288 m AV Peak Grad:      8.1 mmHg AV Mean Grad:      4.0 mmHg LVOT Vmax:         109.00 cm/s LVOT Vmean:        68.300 cm/s LVOT VTI:          0.225 m LVOT/AV VTI ratio: 0.78  AORTA Ao Root diam: 2.80 cm Ao Asc diam:  3.10 cm MITRAL VALVE MV Area (PHT): 4.06 cm    SHUNTS MV Decel Time: 187 msec    Systemic VTI:  0.22 m MV E velocity: 79.20 cm/s  Systemic Diam: 2.00 cm MV A velocity: 92.70 cm/s MV E/A ratio:  0.85 Dietrich Pates MD Electronically signed by Dietrich Pates MD Signature Date/Time: 08/16/2020/7:05:58 PM    Final     Cardiac Studies   Echo from 08/16/20:  1. Left ventricular ejection fraction, by estimation, is 65 to 70%. The  left ventricle has normal function. Left ventricular diastolic parameters  are consistent with Grade I diastolic dysfunction (impaired relaxation).  2. Right ventricular systolic function is normal. The right ventricular  size is normal.  3. Left atrial size was mildly dilated.  4. Trivial mitral valve regurgitation.  5. The aortic valve is normal in structure. Aortic valve regurgitation is  not visualized.  6. The inferior vena cava is normal in size with greater than 50%  respiratory variability, suggesting right atrial pressure of 3 mmHg.   Left Heart Cath on 08/16/20:  Conclusions: 1. Mild to moderate, non-obstructive coronary artery disease. 2. Normal left ventricular filling pressure.  Recommendations: 1. Medical therapy of nonischemic cardiomyopathy and risk factor modification to prevent progression of coronary artery disease.  LVEDP suggests that the patient is euvolemic.   Echocardiogram 08/06/2020 [facility]): - LV mildly dilated with mildly to moderately increased wall thickness. LVEF 30-35%. Atypical septal motion. Hypokinesis of anterolateral wall and mid inferolateral wall. LV diastolic  function is indeterminate.  - RV normal in sizes. Normal systolic function.  - Left atrium mildltly to moderately dilated.  - Right atrium normal in size.  - No significant valvular disease.    Patient Profile     70 y.o. female with a history of chronic diastolic heart failure,  prior stroke on Aspirin and Plavix, COPD, hypertension, CKD, type 2 diabetes, tobacco abuse. She was admitted to [facility] on 08/06/2020 with UTI and altered mental status. She subsequently developed chest pain and found to have a newly reduced LVEF down to 30-35% on echocardiogram 08/06/20. Decision was made to transfer patient to [facility] for cardiac catheterization.   Assessment & Plan    Acute on chronic diastolic heart failure Non-ischemic cardiomyopathy  -Echo on 08/06/2020 [facility]) with LVEF at 30-35% with atypical septal motion. Hypokinesis of anterolateral wall and mid inferolateral wall. LV diastolic function is indeterminate. Previous echo in 2018 with EF at 75%. - Transferred here for cardiac cath 5/16 revealed: Mild to moderate, non-obstructive coronary artery disease. Normal left ventricular filling pressure. - Repeat Echo here 5/16 showed 65 to 70%,Grade I DD - was treated with IV Lasix 40mg  BID, discontinued due to rising Cr.  - Net -1.3L since admission, weight 143 >145 ibs since admission - renal index has recovered to near baseline today, will resume PTA spironolactone 25mg  today - appears euvolemic, no need for further loop diuretics use   Non-obstructive CAD (self reports remote stent placement) Demand Ischemia -HsT peak at 369 at [facility]>>197>>164>>151 -Cath reports as above -Continue ASA, Plavix, BBlocker, statin   HTN: - controlled  - continue lopressor 12.5mg  BID , transition to XL at discharge  - PTA amlodipine has been held, may resume if if BP elevated  - consider ACEi or ARB addition if inadequate control   HLD -Last LDL, 128  08/13/20  -Continue Crestor>>up  titrate to 40mg  QD  -Goal LDL <70  AKI on CKD - Creatinine 1.48 PO here,  downtrend to 0.93 today, improved upon tx of UTI and holding lasix  - baseline difficult to assess, labs seems obtained during AKIs in the past , last Cr 1.59 -2.1 from March to May 2022  - avoid nephrotoxin, trend BMP daily, monitor UOP   Recent UTI History of metabolic encephalopathy, resolved - treated with IV rocephin at outside facility, leukocytosis and confusion resolved , continue to have mild dysuria, management per primary team  Tobacco Use: -Cessation strongly encouraged, patient states she will quit now  DM type 2: -HbA1c, 6.0 - on Januvia at home, SSI in house,  fairly controlled   COPD -No evidence of clear exacerbation today  Hx of CVA - continue DAPT and statin       Will keep follow up appointment with cardiology outpatient on 08/24/20   For questions or updates, please contact [facility] Please consult [email or web address] for contact info under        Signed, Cyndi Bender, NP  08/17/2020, 9:05 AM    Patient seen, examined. Available data reviewed. Agree with findings, assessment, and plan as outlined by Cyndi Bender, NP.  On my evaluation, the patient is resting comfortably.  She is in no distress.  Lungs are clear, heart is regular rate and rhythm with no murmur gallop, HEENT is normal, abdomen soft nontender, extremities have no edema.  I personally reviewed her echo which shows normal LV systolic function.  Difficult to explain her rapid improvement in LV function, but suspect she had rapid recovery of acute Takotsubo syndrome.  I agree with the medication recommendations above with continuation of metoprolol and restarting her on her home dose of spironolactone.  Cardiology will sign off today.  Please call if any questions.  Tonny Bollman, M.D. 08/17/2020 11:25 AM

## 2020-08-17 NOTE — TOC Progression Note (Addendum)
Transition of Care [facility]) - Progression Note    Patient Details  Name: LENAE WHERLEY MRN: [id number] Date of Birth: 1951/01/26  [facility] CM/SW Contact  Terrial Rhodes, LCSWA Phone Number: 08/17/2020, 11:09 AM  Clinical Narrative:      Update- CSW received call from patients insurance and patients insurance is under medical director review.CSW will continue to follow.   Update- CSW spoke with patients girlfriend to try and get in touch with patients son to provide SNF bed offer. Patients sons girlfriend Toniann Fail said she will have patients son call CSW back. CSW called alison with [facility] who confirmed SNF bed offer. Insurance authorization is pending. CSW will continue to follow and assist with discharge planning needs.  CSW left voicemail for patients son. CSW awaiting callback to provide SNF bed offers. CSW started insurance authorization for patient. Reference number is # [id number]. Requested start date is for tomorrow 5/18. CSW faxed over clinicals for review. CSW still awaiting callback from APS caseworker.  SNF choice pending. Insurance authorization pending.    Expected Discharge Plan: Skilled Nursing Facility Barriers to Discharge: Continued Medical Work up  Expected Discharge Plan and Services Expected Discharge Plan: Skilled Nursing Facility In-house Referral: Clinical Social Work     Living arrangements for the past 2 months: Single Family Home                                       Social Determinants of Health (SDOH) Interventions    Readmission Risk Interventions No flowsheet data found.

## 2020-08-17 NOTE — Discharge Instructions (Signed)

## 2020-08-17 NOTE — Progress Notes (Signed)
PROGRESS NOTE    Mariah LINE  White DOB: 11/24/1950 DOA: 08/12/2020 PCP: [facility]    Brief Narrative:  Mariah White is a 70 year old female with past medical history significant for essential hypertension, chronic diastolic congestive heart failure, CAD, prior CVA, COPD on 2 L nasal cannula oxygen at baseline presents as a transfer from [facility] for chest pain.  Patient had been hospitalized at [facility] from 5/6-08/2010; after initially presenting following fall at home with complaints of left hip pain.  She reports at that time she was reaching for cigarettes while in bed and fell without loss of consciousness.  CT scan of her head without contrast did not show any acute intracranial abnormalities and x-rays pelvis with no acute fractures.  While in the ED at [facility] ER, patient reported to complain of some dysuria and was noted to be with some mild hypertension.  Hypotension resolved with IV fluids and she was started on IV antibiotics for urinary tract infection with ceftriaxone.  During her hospital stay, patient has been noted to be intermittently confused at times with plan to transfer patient to a rehab facility due to ongoing weakness.  Patient then started to complain of chest pain 2-3 days prior to transfer with localization in the middle of her chest with pressure-like feeling.  Also associated with shortness of breath with exertion, nausea/vomiting and diaphoresis. Chest pain was reportedly brought to the provider's attention the day prior to transfer and EKG was found to have concern for Q waves.  High-sensitivity troponins were noted to be elevated 325->369.  Echocardiogram was obtained which showed EF of 30 to 35%.  Patient has been started on heparin IV as well as evaluated by cardiology who recommended cardiac cath.  Case was discussed with Dr. Wyline Mood of cardiology here at [facility] as well as Dr. Ophelia Charter of [facility].  Patient  accepted to a cardiac telemetry bed as inpatient.    Assessment & Plan:   Principal Problem:   Elevated troponin Active Problems:   COPD (chronic obstructive pulmonary disease) (HCC)   CAD (coronary artery disease)   Essential hypertension   Pressure injury of skin   Acute HFrEF (heart failure with reduced ejection fraction) (HCC)   Leukocytosis   NSTEMI (non-ST elevated myocardial infarction) (HCC)   Acute metabolic encephalopathy   Hyponatremia   Hyperlipidemia with target LDL less than 70   Acute on chronic diastolic congestive heart failure Nonischemic cardiomyopathy Patient presenting from [facility] with chest pain.  TTE ED at outside hospital notable for EF 30-35%.  BNP 1089.  On arrival, high-sensitivity troponin elevated 197>164>151.  EKG with NSR, rate 76, QTc 499, T wave inversions in 1/aVL, lead II, V4-V6; there is change in comparison to EKG dated 05/04/2017 in lead II, V4-V6, otherwise unchanged.  Underwent left heart catheterization on 08/16/2020 with nonobstructive CAD, heparin drip now discontinued.  Repeat TTE 5/16 with recovery of EF to 65-70% with grade 1 diastolic dysfunction.  Etiology of suppressed EF thought to be secondary to Takotsubo's cardiomyopathy. --Cardiology following, appreciate assistance --net negative 1.3L since admission --Metoprolol tartrate 12.5 mg p.o. twice daily --Restarted home spironolactone 25 mg p.o. daily today --Strict I's and O's and daily weights --Continue monitor on telemetry  Chronic respiratory failure with hypoxia, 2L [place] baseline COPD --Continue supplemental oxygen, maintain SPO2 > 88%, currently on 2L [place] which is her home dose --DuoNebs as needed  Hyponatremia On transfer, sodium noted to be 124.  Suspect  volume overload in the setting of decompensated congestive heart failure.  Urine sodium 73, urine osmolality 430, serum osmolality 289.  --Na 124>126>131>131>135>136 --BMP in the a.m.  Hypokalemia: resolved Potassium 4.0  today --Repeat electrolytes in a.m. to include magnesium  Recent UTI Patient was treated with IV Rocephin during her most recent hospitalization.  Leukocytosis now resolved.  Type 2 diabetes mellitus Hemoglobin A1c 6.0, well controlled.  On Ozempic and Januvia 25 mg p.o. daily outpatient. --SSI for coverage --CBGs QAC/HS  CKD stage IIIb Baseline creatinine 1.5-1.6.  Creatinine on admission to [facility] 2.10. --Cr 1.48>1.49>1.59>1.30>1.12>0.93 -- Restarted spironolactone as above, cardiology does not believe any further loop diuretics necessary at this time --BMP daily  Hyperlipidemia HDL 53, LDL 128. --Continue Crestor 40 mg p.o. daily  CAD  --Aspirin 81 mg p.o. daily, Plavix 75 mg p.o. daily, statin  Depression/anxiety --Abilify 5 mg p.o. nightly --Duloxetine 60 mg p.o. daily --Fluoxetine 40 mg p.o. daily  Tobacco use disorder Continues to endorse cigarette use.  Counseled on need for complete cessation. --Nicotine patch  Generalized weakness Records on transfer report patient has been having difficulty walking at baseline with multiple falls.  During her hospitalization at [facility] ER, patient was noted to have continued weakness for which rehab had initially been recommended. --Continue PT/OT efforts while inpatient --Pending SNF placement  DVT prophylaxis: Lovenox   Code Status: Full Code Family Communication: No family present at bedside this morning  Disposition Plan:  Level of care: Telemetry Cardiac Status is: Inpatient  Remains inpatient appropriate because:Persistent severe electrolyte disturbances, Ongoing diagnostic testing needed not appropriate for outpatient work up, Unsafe d/c plan, IV treatments appropriate due to intensity of illness or inability to take PO and Inpatient level of care appropriate due to severity of illness   Dispo: The patient is from: Home              Anticipated d/c is to: SNF              Patient currently is medically stable  to d/c.   Difficult to place patient No   Consultants:   Cardiology  Procedures:   None  Antimicrobials:   Ceftriaxone: completed course at OSH   Subjective: Patient seen and examined at bedside, resting comfortably.  Lying in bed.  No complaints or concerns this morning.  No family present.  Awaiting SNF placement.  EF has now recovered on echocardiogram from yesterday.  Cardiology restarting spironolactone today. Denies headache, no dizziness, no chest pain, no palpitations, no fever/chills/night sweats, no nausea/vomiting/diarrhea, no abdominal pain, no cough/congestion.  Nursing reports occasional confusion overnight, otherwise no other acute issues/events per nursing staff.  Objective: Vitals:   08/17/20 0003 08/17/20 0446 08/17/20 0900 08/17/20 0954  BP: 134/65 (!) 115/59 (!) 110/58 116/61  Pulse: 68 68 74 79  Resp: 16 16 16    Temp: 98.2 F (36.8 C) 97.9 F (36.6 C) 98.2 F (36.8 C)   TempSrc: Oral Oral Oral   SpO2: 99% 98% 98%   Weight:  65.9 kg    Height:        Intake/Output Summary (Last 24 hours) at 08/17/2020 1107 Last data filed at 08/17/2020 0959 Gross per 24 hour  Intake 251.73 ml  Output 200 ml  Net 51.73 ml   Filed Weights   08/15/20 0428 08/16/20 0410 08/17/20 0446  Weight: 66.4 kg 63.8 kg 65.9 kg    Examination:  General exam: Appears calm and comfortable Respiratory system: CTAB, now wheezing/crackles, normal respiratory effort,  on 2 L [place] (2L at home) Cardiovascular system: S1 & S2 heard, RRR. No JVD, murmurs, rubs, gallops or clicks. No pedal edema. Gastrointestinal system: Abdomen is nondistended, soft and nontender. No organomegaly or masses felt. Normal bowel sounds heard. Central nervous system: Alert and oriented to person/place/time/situation. No focal neurological deficits. Extremities: Symmetric 5 x 5 power. Skin: No rashes, lesions or ulcers Psychiatry: Judgement and insight appear poor. Mood & affect appropriate.     Data  Reviewed: I have personally reviewed following labs and imaging studies  CBC: Recent Labs  Lab 08/13/20 0011 08/14/20 0340 08/15/20 0015 08/16/20 0255 08/17/20 0219  WBC 11.3* 10.6* 10.6* 12.1* 8.3  HGB 14.3 13.6 12.2 11.7* 10.8*  HCT 43.6 42.1 37.5 38.1 34.5*  MCV 85.8 87.0 88.2 91.6 91.8  PLT 318 226 206 199 194   Basic Metabolic Panel: Recent Labs  Lab 08/13/20 0011 08/14/20 0340 08/15/20 0015 08/16/20 0255 08/17/20 0219  NA 126* 131* 131* 135 136  K 3.8 3.3* 4.1 4.2 4.0  CL 90* 93* 99 104 106  CO2 27 28 26 28 26   GLUCOSE 149* 91 103* 90 96  BUN 48* 56* 53* 30* 19  CREATININE 1.49* 1.59* 1.30* 1.12* 0.93  CALCIUM 8.5* 8.4* 8.4* 8.5* 8.3*  MG  --  1.9 2.0 2.1  --    GFR: Estimated Creatinine Clearance: 50.1 mL/min (by C-G formula based on SCr of 0.93 mg/dL). Liver Function Tests: Recent Labs  Lab 08/12/20 1943  AST 31  ALT 24  ALKPHOS 93  BILITOT 0.9  PROT 7.1  ALBUMIN 2.8*   No results for input(s): LIPASE, AMYLASE in the last 168 hours. No results for input(s): AMMONIA in the last 168 hours. Coagulation Profile: No results for input(s): INR, PROTIME in the last 168 hours. Cardiac Enzymes: No results for input(s): CKTOTAL, CKMB, CKMBINDEX, TROPONINI in the last 168 hours. BNP (last 3 results) No results for input(s): PROBNP in the last 8760 hours. HbA1C: No results for input(s): HGBA1C in the last 72 hours. CBG: Recent Labs  Lab 08/16/20 0634 08/16/20 1125 08/16/20 1640 08/16/20 2018 08/17/20 0902  GLUCAP 93 154* 108* 128* 97   Lipid Profile: No results for input(s): CHOL, HDL, LDLCALC, TRIG, CHOLHDL, LDLDIRECT in the last 72 hours. Thyroid Function Tests: No results for input(s): TSH, T4TOTAL, FREET4, T3FREE, THYROIDAB in the last 72 hours. Anemia Panel: No results for input(s): VITAMINB12, FOLATE, FERRITIN, TIBC, IRON, RETICCTPCT in the last 72 hours. Sepsis Labs: No results for input(s): PROCALCITON, LATICACIDVEN in the last 168  hours.  No results found for this or any previous visit (from the past 240 hour(s)).       Radiology Studies: CARDIAC CATHETERIZATION  Result Date: 08/16/2020 Conclusions: 1. Mild to moderate, non-obstructive coronary artery disease. 2. Normal left ventricular filling pressure. Recommendations: 1. Medical therapy of nonischemic cardiomyopathy and risk factor modification to prevent progression of coronary artery disease.  LVEDP suggests that the patient is euvolemic. 08/18/2020, MD [facility]   ECHOCARDIOGRAM LIMITED  Result Date: 08/16/2020    ECHOCARDIOGRAM LIMITED REPORT   Patient Name:   08/18/2020 Date of Exam: 08/16/2020 Medical Rec #:  08/18/2020     Height:       62.0 in Accession #:    [id number]    Weight:       140.6 lb Date of Birth:  Feb 12, 1951    BSA:          1.646 m Patient Age:  69 years      BP:           105/55 mmHg Patient Gender: F             HR:           64 bpm. Exam Location:  Inpatient Procedure: 2D Echo, Cardiac Doppler and Color Doppler Indications:    CHF  History:        Patient has prior history of Echocardiogram examinations, most                 recent 08/14/2016. CAD, COPD and Stroke; Risk Factors:Current                 Smoker and Hypertension.  Sonographer:    Shirlean KellyJohn Mendel Brown Referring Phys: [phone] MICHAEL COOPER IMPRESSIONS  1. Left ventricular ejection fraction, by estimation, is 65 to 70%. The left ventricle has normal function. Left ventricular diastolic parameters are consistent with Grade I diastolic dysfunction (impaired relaxation).  2. Right ventricular systolic function is normal. The right ventricular size is normal.  3. Left atrial size was mildly dilated.  4. Trivial mitral valve regurgitation.  5. The aortic valve is normal in structure. Aortic valve regurgitation is not visualized.  6. The inferior vena cava is normal in size with greater than 50% respiratory variability, suggesting right atrial pressure of 3 mmHg. FINDINGS  Left  Ventricle: Left ventricular ejection fraction, by estimation, is 65 to 70%. The left ventricle has normal function. Left ventricular diastolic parameters are consistent with Grade I diastolic dysfunction (impaired relaxation). Right Ventricle: The right ventricular size is normal. Right ventricular systolic function is normal. Left Atrium: Left atrial size was mildly dilated. Right Atrium: Right atrial size was normal in size. Pericardium: Trivial pericardial effusion is present. Mitral Valve: Mild mitral annular calcification. Trivial mitral valve regurgitation. Tricuspid Valve: The tricuspid valve is normal in structure. Tricuspid valve regurgitation is trivial. Aortic Valve: The aortic valve is normal in structure. Aortic valve regurgitation is not visualized. Aortic valve mean gradient measures 4.0 mmHg. Aortic valve peak gradient measures 8.1 mmHg. Aortic valve area, by VTI measures 2.45 cm. Pulmonic Valve: The pulmonic valve was normal in structure. Pulmonic valve regurgitation is mild. Aorta: The aortic root and ascending aorta are structurally normal, with no evidence of dilitation. Venous: The inferior vena cava is normal in size with greater than 50% respiratory variability, suggesting right atrial pressure of 3 mmHg. IAS/Shunts: No atrial level shunt detected by color flow Doppler. LEFT VENTRICLE PLAX 2D LVIDd:         4.60 cm  Diastology LVIDs:         3.40 cm  LV e' medial:    5.55 cm/s LV PW:         0.90 cm  LV E/e' medial:  14.3 LV IVS:        0.90 cm  LV e' lateral:   6.31 cm/s LVOT diam:     2.00 cm  LV E/e' lateral: 12.6 LV SV:         71 LV SV Index:   43 LVOT Area:     3.14 cm  RIGHT VENTRICLE            IVC RV Basal diam:  2.80 cm    IVC diam: 1.00 cm RV S prime:     9.03 cm/s TAPSE (M-mode): 1.6 cm LEFT ATRIUM             Index  RIGHT ATRIUM          Index LA diam:        3.40 cm 2.07 cm/m  RA Area:     9.60 cm LA Vol (A2C):   44.5 ml 27.04 ml/m RA Volume:   19.90 ml 12.09 ml/m LA  Vol (A4C):   57.0 ml 34.63 ml/m LA Biplane Vol: 52.3 ml 31.78 ml/m  AORTIC VALVE AV Area (Vmax):    2.41 cm AV Area (Vmean):   2.31 cm AV Area (VTI):     2.45 cm AV Vmax:           142.00 cm/s AV Vmean:          92.800 cm/s AV VTI:            0.288 m AV Peak Grad:      8.1 mmHg AV Mean Grad:      4.0 mmHg LVOT Vmax:         109.00 cm/s LVOT Vmean:        68.300 cm/s LVOT VTI:          0.225 m LVOT/AV VTI ratio: 0.78  AORTA Ao Root diam: 2.80 cm Ao Asc diam:  3.10 cm MITRAL VALVE MV Area (PHT): 4.06 cm    SHUNTS MV Decel Time: 187 msec    Systemic VTI:  0.22 m MV E velocity: 79.20 cm/s  Systemic Diam: 2.00 cm MV A velocity: 92.70 cm/s MV E/A ratio:  0.85 Dietrich Pates MD Electronically signed by Dietrich Pates MD Signature Date/Time: 08/16/2020/7:05:58 PM    Final         Scheduled Meds: . ARIPiprazole  5 mg Oral QHS  . aspirin EC  81 mg Oral Daily  . clopidogrel  75 mg Oral Daily  . DULoxetine  60 mg Oral Daily  . enoxaparin (LOVENOX) injection  40 mg Subcutaneous Q24H  . FLUoxetine  40 mg Oral Daily  . insulin aspart  0-9 Units Subcutaneous TID WC  . LORazepam  1 mg Intravenous Once  . metoprolol tartrate  12.5 mg Oral BID  . nicotine  14 mg Transdermal Daily  . rosuvastatin  40 mg Oral Daily  . sodium chloride flush  3 mL Intravenous Q12H  . spironolactone  25 mg Oral Daily   Continuous Infusions: . sodium chloride       LOS: 5 days    Time spent: 36 minutes spent on chart review, discussion with nursing staff, consultants, updating family and interview/physical exam; more than 50% of that time was spent in counseling and/or coordination of care.    Alvira Philips [place], DO [facility] Available via Epic secure chat 7am-7pm After these hours, please refer to coverage provider listed on [email or web address] 08/17/2020, 11:07 AM

## 2020-08-17 NOTE — Progress Notes (Signed)
OT Cancellation Note  Patient Details Name: Mariah White MRN: [id number] DOB: February 04, 1951   Cancelled Treatment:    Reason Eval/Treat Not Completed: Patient declined, no reason specified. Pt refused reporting that she did not feel like getting up. Pt was educated on the importance of mobility and working on ADL's, Pt continued to refuse saying that she was not going to get up today. OT will follow up as time and schedule allows.   Sharae Zappulla H., OTR/L Acute Rehabilitation  Tressie Ragin Elane Bing Plume 08/17/2020, 12:00 PM

## 2020-08-17 NOTE — Care Management Important Message (Signed)
Important Message  Patient Details  Name: Mariah White MRN: [id number] Date of Birth: 1951/01/01   Medicare Important Message Given:  Yes     Renie Ora 08/17/2020, 10:09 AM

## 2020-08-18 ENCOUNTER — Inpatient Hospital Stay (HOSPITAL_COMMUNITY): Payer: Medicare Other

## 2020-08-18 DIAGNOSIS — J441 Chronic obstructive pulmonary disease with (acute) exacerbation: Secondary | ICD-10-CM | POA: Diagnosis not present

## 2020-08-18 DIAGNOSIS — R778 Other specified abnormalities of plasma proteins: Secondary | ICD-10-CM | POA: Diagnosis not present

## 2020-08-18 LAB — BASIC METABOLIC PANEL
Anion gap: 6 (ref 5–15)
BUN: 17 mg/dL (ref 8–23)
CO2: 23 mmol/L (ref 22–32)
Calcium: 8.5 mg/dL — ABNORMAL LOW (ref 8.9–10.3)
Chloride: 105 mmol/L (ref 98–111)
Creatinine, Ser: 0.97 mg/dL (ref 0.44–1.00)
GFR, Estimated: >60 mL/min (ref >60)
Glucose, Bld: 87 mg/dL (ref 70–99)
Potassium: 4 mmol/L (ref 3.5–5.1)
Sodium: 134 mmol/L — ABNORMAL LOW (ref 135–145)

## 2020-08-18 LAB — GLUCOSE, CAPILLARY
Glucose-Capillary: 100 mg/dL — ABNORMAL HIGH (ref 70–99)
Glucose-Capillary: 131 mg/dL — ABNORMAL HIGH (ref 70–99)
Glucose-Capillary: 122 mg/dL — ABNORMAL HIGH (ref 70–99)
Glucose-Capillary: 112 mg/dL — ABNORMAL HIGH (ref 70–99)

## 2020-08-18 LAB — SARS CORONAVIRUS 2 (TAT 6-24 HRS): SARS Coronavirus 2: NEGATIVE

## 2020-08-18 NOTE — Progress Notes (Addendum)
PROGRESS NOTE    Mariah White  RAQ:[id number] DOB: 08/07/1950 DOA: 08/12/2020 PCP: [facility]    No chief complaint on file.   Brief Narrative:  Mariah White is a 70 year old female with past medical history significant for essential hypertension, chronic diastolic congestive heart failure, CAD, prior CVA, COPD on 2 L nasal cannula oxygen at baseline presents as a transfer from [facility] for chest pain.  Patient had been hospitalized [facility] from 5/6-08/2010; after initially presenting following fall at home with complaints of left hip pain.  She reports at that time she was reaching for cigarettes while in bed and fell without loss of consciousness.  CT scan of her head without contrast did not show any acute intracranial abnormalities and x-rays pelvis with no acute fractures.  While in the ED at [facility] ER, patient reported to complain of some dysuria and was noted to be with some mild hypertension.  Hypotension resolved with IV fluids and she was started on IV antibiotics for urinary tract infection with ceftriaxone.  During her hospital stay, patient has been noted to be intermittently confused at times with plan to transfer patient to a rehab facility due to ongoing weakness.  Patient then started to complain of chest pain 2-3 days prior to transfer with localization in the middle of her chest with pressure-like feeling.  Also associated with shortness of breath with exertion, nausea/vomiting and diaphoresis. Chest pain was reportedlybrought to the provider's attention the day prior to transfer and EKG was found to have concern for Q waves. High-sensitivity troponins were noted to be elevated 325->369.Echocardiogram was obtained which showed EF of 30 to 35%. Patient has been started on heparin IV as well as evaluated by cardiology who recommended cardiac cath. Assessment & Plan:   Principal Problem:   Elevated troponin Active Problems:   COPD  (chronic obstructive pulmonary disease) (HCC)   CAD (coronary artery disease)   Essential hypertension   Pressure injury of skin   Acute HFrEF (heart failure with reduced ejection fraction) (HCC)   Leukocytosis   NSTEMI (non-ST elevated myocardial infarction) (HCC)   Acute metabolic encephalopathy   Hyponatremia   Hyperlipidemia with target LDL less than 70    Acute on chronic diastolic CHF/ NICM:  Echocardiogram on 08/06/2020 at [facility] showed left ventricular ejection fraction of 30 to 35% with atypical septal motion.  Hypokinesis of the anterolateral lateral wall and mid inferolateral wall. She was transferred to [facility] for cardiac catheterization which showed mild to moderate nonobstructive coronary artery disease with normal left ventricular filling pressures. Repeat echocardiogram on 08/16/2020 showed left ventricular ejection fraction of 65 to 70% with grade 1 diastolic dysfunction.  She was initially treated with IV Lasix 40 mg twice daily which was discontinued due to rising creatinine. Patient appears stable, euvolemic no need for any Lasix at this time.   Chronic respiratory failure with hypoxia, 2L  baseline COPD Continue with nasal cannula oxygen to keep sats greater than 88%, and bronchodilators as needed.  Hyponatremia Probably secondary to volume overload from decompensated CHF. Sodium has improved.  Hypokalemia: resolved   Recent UTI Completely treated with IV Rocephin.  Type 2 diabetes mellitus Hemoglobin A1c 6.0, well controlled.  On Ozempic and Januvia 25 mg p.o. daily outpatient. Resume home medications on discharge  CKD stage IIIb Baseline creatinine 1.5-1.6.  Creatinine on admission at 2.1 Avoid nephrotoxins at this time  Hyperlipidemia HDL 53, LDL 128. --Continue Crestor 40 mg p.o. daily  CAD  Continue with aspirin, Plavix and statin  Depression/anxiety Resume Abilify, duloxetine and fluoxetine.  Tobacco use  disorder Continue with nicotine patch Generalized weakness Deconditioning, therapy evaluations recommending SNF waiting for a bed and insurance authorization.    DVT prophylaxis: LOVENOX Code Status: Full code Family Communication: none at bedside.  Disposition:   Status is: Inpatient  Remains inpatient appropriate because:Unsafe d/c plan   Dispo: The patient is from: Home              Anticipated d/c is to: SNF              Patient currently is not medically stable to d/c.   Difficult to place patient No       Consultants:   Cardiology    Procedures:  Antimicrobials: None.   Subjective: Comfortable. No new complaints.   Objective: Vitals:   08/18/20 0500 08/18/20 0746 08/18/20 0804 08/18/20 1115  BP:   135/76 (!) 111/51  Pulse:   76 63  Resp:   18 16  Temp:   97.7 F (36.5 C) 97.8 F (36.6 C)  TempSrc:   Oral Oral  SpO2:  99% 97% 100%  Weight: 65.8 kg     Height:        Intake/Output Summary (Last 24 hours) at 08/18/2020 1529 Last data filed at 08/18/2020 0500 Gross per 24 hour  Intake 240 ml  Output 225 ml  Net 15 ml   Filed Weights   08/16/20 0410 08/17/20 0446 08/18/20 0500  Weight: 63.8 kg 65.9 kg 65.8 kg    Examination:  General exam: Appears calm and comfortable  Respiratory system: Clear to auscultation. Respiratory effort normal. Cardiovascular system: S1 & S2 heard, RRR. No JVD, murmurs, rubs, gallops or clicks. No pedal edema. Gastrointestinal system: Abdomen is nondistended, soft and nontender. No organomegaly or masses felt. Normal bowel sounds heard. Central nervous system: Alert and oriented. No focal neurological deficits. Extremities: Symmetric 5 x 5 power. Skin: No rashes, lesions or ulcers Psychiatry: Judgement and insight appear normal. Mood & affect appropriate.     Data Reviewed: I have personally reviewed following labs and imaging studies  CBC: Recent Labs  Lab 08/13/20 0011 08/14/20 0340 08/15/20 0015  08/16/20 0255 08/17/20 0219  WBC 11.3* 10.6* 10.6* 12.1* 8.3  HGB 14.3 13.6 12.2 11.7* 10.8*  HCT 43.6 42.1 37.5 38.1 34.5*  MCV 85.8 87.0 88.2 91.6 91.8  PLT 318 226 206 199 194    Basic Metabolic Panel: Recent Labs  Lab 08/14/20 0340 08/15/20 0015 08/16/20 0255 08/17/20 0219 08/18/20 0308  NA 131* 131* 135 136 134*  K 3.3* 4.1 4.2 4.0 4.0  CL 93* 99 104 106 105  CO2 28 26 28 26 23   GLUCOSE 91 103* 90 96 87  BUN 56* 53* 30* 19 17  CREATININE 1.59* 1.30* 1.12* 0.93 0.97  CALCIUM 8.4* 8.4* 8.5* 8.3* 8.5*  MG 1.9 2.0 2.1  --   --     GFR: Estimated Creatinine Clearance: 48 mL/min (by C-G formula based on SCr of 0.97 mg/dL).  Liver Function Tests: Recent Labs  Lab 08/12/20 1943  AST 31  ALT 24  ALKPHOS 93  BILITOT 0.9  PROT 7.1  ALBUMIN 2.8*    CBG: Recent Labs  Lab 08/17/20 1213 08/17/20 1706 08/17/20 2320 08/18/20 0802 08/18/20 1117  GLUCAP 122* 109* 126* 100* 131*     No results found for this or any previous visit (from the past 240 hour(s)).  Radiology Studies: No results found.      Scheduled Meds: . ARIPiprazole  5 mg Oral QHS  . aspirin EC  81 mg Oral Daily  . clopidogrel  75 mg Oral Daily  . DULoxetine  60 mg Oral Daily  . enoxaparin (LOVENOX) injection  40 mg Subcutaneous Q24H  . FLUoxetine  40 mg Oral Daily  . insulin aspart  0-9 Units Subcutaneous TID WC  . LORazepam  1 mg Intravenous Once  . metoprolol tartrate  12.5 mg Oral BID  . nicotine  14 mg Transdermal Daily  . rosuvastatin  40 mg Oral Daily  . sodium chloride flush  3 mL Intravenous Q12H  . spironolactone  25 mg Oral Daily   Continuous Infusions: . sodium chloride       LOS: 6 days        Kathlen Mody, MD [facility]   To contact the attending provider between 7A-7P or the covering provider during after hours 7P-7A, please log into the web site [email or web address] and access using universal [place] password for that web site. If you do not  have the password, please call the hospital operator.  08/18/2020, 3:29 PM

## 2020-08-18 NOTE — TOC Progression Note (Signed)
Transition of Care [facility]) - Progression Note    Patient Details  Name: Mariah White MRN: [id number] Date of Birth: Jan 14, 1951  Transition of Care [facility]) CM/SW Contact  Terrial Rhodes, LCSWA Phone Number: 08/18/2020, 1:18 PM  Clinical Narrative:     CSW received call from patients insurance requesting updated MD note. CSW faxed over current MD note for patients insurance to review.  Patients insurance is under [employer] review. CSW received call from patients son who confirmed he would like to accept SNF bed offer with [facility] for patient. CSW will continue to follow and assist with discharge planning needs.    Expected Discharge Plan: Skilled Nursing Facility Barriers to Discharge: Continued Medical Work up  Expected Discharge Plan and Services Expected Discharge Plan: Skilled Nursing Facility In-house Referral: Clinical Social Work     Living arrangements for the past 2 months: Single Family Home                                       Social Determinants of Health (SDOH) Interventions    Readmission Risk Interventions No flowsheet data found.

## 2020-08-18 NOTE — Progress Notes (Signed)
Occupational Therapy Treatment Patient Details Name: Mariah White MRN: [id number] DOB: 1950-06-14 Today's Date: 08/18/2020    History of present illness 70 y.o. female presents to [facility] hospital on 08/12/2020 as a transfer from [place] with reports of chest and L hip pain after fall. Head CT with no acute abnormalities. Pelvic xray negative for fx. EKG with concern for Q waves; high-sensitivity troponins elevated. Workup for newly dx acute systolic CHF. PMH includes HTN, diastolic CHF, CAD, CVA, COPD (2L O2 baseline)   OT comments  This 70 yo female admitted with above seen today to see how she followed commands and was able to physically move. Noted intermittent disconjugate gaze (RN made aware), pt attempted to do all asked of her but needs increased A from when she was evaluated and decreased cognition from when she was evaluated. We will continue to follow patient acutely with recommendation now changed to SNF.  Follow Up Recommendations  SNF;Supervision/Assistance - 24 hour    Equipment Recommendations  Other (comment) (TBD next venue of care)       Precautions / Restrictions Precautions Precautions: Fall Restrictions Weight Bearing Restrictions: No       Mobility Bed Mobility Overal bed mobility: Needs Assistance Bed Mobility: Supine to Sit;Sit to Supine     Supine to sit: Max assist;+2 for physical assistance Sit to supine: Max assist;+2 for physical assistance   General bed mobility comments: step by step sequencing       Balance Overall balance assessment: Needs assistance Sitting-balance support: Feet supported;Bilateral upper extremity supported Sitting balance-Leahy Scale: Fair Sitting balance - Comments: started out at poor and advanced to fair       Standing balance comment: unable to stand with even +2 A                           ADL either performed or assessed with clinical judgement   ADL Overall ADL's : Needs assistance/impaired      Grooming: Wash/dry face;Bed level Grooming Details (indicate cue type and reason): setup/S                                     Vision   Additional Comments: Noted pt to have somewhat of a disconjugate gaze (right eye midline and left eye more off center to left). Pt was able to bring gaze together intermittenlty, but at times she reported seeing 2 of one item          Cognition Arousal/Alertness: Awake/alert Behavior During Therapy: Flat affect Overall Cognitive Status: No family/caregiver present to determine baseline cognitive functioning Area of Impairment: Following commands;Safety/judgement;Problem solving                       Following Commands: Follows one step commands inconsistently;Follows one step commands with increased time Safety/Judgement: Decreased awareness of safety;Decreased awareness of deficits   Problem Solving: Slow processing;Decreased initiation;Difficulty sequencing;Requires verbal cues;Requires tactile cues                     Pertinent Vitals/ Pain       Pain Assessment: Faces Faces Pain Scale: No hurt         Frequency  Min 2X/week        Progress Toward Goals  OT Goals(current goals can now be found in the care plan section)  Progress towards OT  goals: Not progressing toward goals - comment (not doing as well phyically or cognitively)  Acute Rehab OT Goals Patient Stated Goal: did not state--agreeable to try and work with therapy OT Goal Formulation: Patient unable to participate in goal setting Time For Goal Achievement: 09/01/20 Potential to Achieve Goals: Fair  Plan Discharge plan needs to be updated       AM-PAC OT "6 Clicks" Daily Activity     Outcome Measure   Help from another person eating meals?: A Lot Help from another person taking care of personal grooming?: A Lot Help from another person toileting, which includes using toliet, bedpan, or urinal?: A Lot Help from another person bathing  (including washing, rinsing, drying)?: A Lot Help from another person to put on and taking off regular upper body clothing?: A Lot Help from another person to put on and taking off regular lower body clothing?: Total 6 Click Score: 11    End of Session Equipment Utilized During Treatment: Rolling walker;Gait belt;Oxygen  OT Visit Diagnosis: Other abnormalities of gait and mobility (R26.89);Muscle weakness (generalized) (M62.81);Other symptoms and signs involving cognitive function   Activity Tolerance Patient limited by fatigue   Patient Left in bed;with call bell/phone within reach;with bed alarm set   Nurse Communication  (noted an intermittent disconjugate gaze)        Time: 9381-8299 OT Time Calculation (min): 34 min  Charges: OT General Charges $OT Visit: 1 Visit OT Treatments $Self Care/Home Management : 23-37 mins  Ignacia Palma, OTR/L Acute [employer] Pager [phone] Office [phone]      Evette Georges 08/18/2020, 2:58 PM

## 2020-08-19 DIAGNOSIS — E871 Hypo-osmolality and hyponatremia: Secondary | ICD-10-CM | POA: Diagnosis not present

## 2020-08-19 DIAGNOSIS — G9341 Metabolic encephalopathy: Secondary | ICD-10-CM | POA: Diagnosis not present

## 2020-08-19 DIAGNOSIS — I10 Essential (primary) hypertension: Secondary | ICD-10-CM | POA: Diagnosis not present

## 2020-08-19 DIAGNOSIS — I5021 Acute systolic (congestive) heart failure: Secondary | ICD-10-CM | POA: Diagnosis not present

## 2020-08-19 LAB — GLUCOSE, CAPILLARY
Glucose-Capillary: 93 mg/dL (ref 70–99)
Glucose-Capillary: 163 mg/dL — ABNORMAL HIGH (ref 70–99)
Glucose-Capillary: 114 mg/dL — ABNORMAL HIGH (ref 70–99)
Glucose-Capillary: 106 mg/dL — ABNORMAL HIGH (ref 70–99)

## 2020-08-19 MED ORDER — ALPRAZOLAM 0.25 MG PO TABS: 0.2500 mg | ORAL_TABLET | Freq: Three times a day (TID) | ORAL | 0 refills | Status: DC | PRN

## 2020-08-19 MED ORDER — METOPROLOL TARTRATE 25 MG PO TABS: 12.5000 mg | ORAL_TABLET | Freq: Two times a day (BID) | ORAL | 1 refills | Status: DC

## 2020-08-19 MED ORDER — ROSUVASTATIN CALCIUM 40 MG PO TABS: 40.0000 mg | ORAL_TABLET | Freq: Every day | ORAL | 1 refills | Status: DC

## 2020-08-19 MED ORDER — NICOTINE 14 MG/24HR TD PT24: 14.0000 mg | MEDICATED_PATCH | Freq: Every day | TRANSDERMAL | 0 refills | Status: DC

## 2020-08-19 NOTE — TOC Progression Note (Addendum)
Transition of Care [facility]) - Progression Note    Patient Details  Name: Mariah White MRN: [id number] Date of Birth: [birth year]  Transition of Care [facility]) CM/SW Contact  Terrial Rhodes, LCSWA Phone Number: 08/19/2020, 9:54 AM  Clinical Narrative:     Update 3:10pm- CSW checked on patients insurance authorization. Patients insurance confirmed they received PT note that was faxed over for review. Patients insurance confirmed that patients insurance authorization is still pending. MD updated.CSW will continue to follow.  Update-CSW faxed over current PT note over to patients insurance for review. CSW awaiting determination.CSW tried to call patients son to give update. CSW unable to get in touch with patients son.CSW will try to call again shortly. Jill Side with [facility] was updated.  Update- Peer to peer was completed by MD. Patients insurance is requesting current PT note for review. CSW informed PT. CSW awaiting PT note to fax over to patients insurance for review.  CSW received call from patients insurance who confirmed that patients insurance went to a peer to peer review. CSW informed MD. Telephone number given to MD to call for peer to peer review.Telephone number [phone] then press option [phone]. Deadline is today at 1:15pm [place] standard time. Patient has SNF bed [facility].CSW tried to call APS caseworker multiple times. CSW awaiting callback. CSW will continue to follow and assist with discharge planning needs.  Expected Discharge Plan: Skilled Nursing Facility Barriers to Discharge: Continued Medical Work up  Expected Discharge Plan and Services Expected Discharge Plan: Skilled Nursing Facility In-house Referral: Clinical Social Work     Living arrangements for the past 2 months: Single Family Home                                       Social Determinants of Health (SDOH) Interventions    Readmission Risk Interventions No flowsheet data  found.

## 2020-08-19 NOTE — Discharge Summary (Addendum)
Physician Discharge Summary  Mariah White WUJ:[id number] DOB: 01/05/1951 DOA: 08/12/2020  PCP: [facility]  Admit date: 08/12/2020 Discharge date: 08/20/2020  Admitted From: Home Disposition: SNF  Recommendations for Outpatient Follow-up:  1. Follow up with PCP in 1-2 weeks 2. Please obtain BMP/CBC in one week Please follow up with cardiology as needed    Discharge Condition: Stable  CODE STATUS: Full code Diet recommendation: Heart Healthy   Brief/Interim Summary: Mariah Gergen Wootenis a 70 year old female with past medical history significant for essential hypertension, chronic diastolic congestive heart failure, CAD, prior CVA, COPD on 2 L nasal cannula oxygen at baseline presents as a transfer from [facility] for chest pain. Patient had been hospitalized at [facility] from 5/6-08/2010; after initially presenting following fall at home with complaints of left hip pain. She reports at that time she was reaching for cigarettes while in bed and fell without loss of consciousness. CT scan of her head without contrast did not show any acute intracranial abnormalities and x-rays pelvis with no acute fractures. While in the ED at [facility] ER, patient reported to complain of some dysuria and was noted to be with some mild hypertension. Hypotension resolved with IV fluids and she was started on IV antibiotics for urinary tract infection with ceftriaxone. During her hospital stay, patient has been noted to be intermittently confused at times with plan to transfer patient to a rehab facility due to ongoing weakness. Patient then started to complain of chest pain 2-3 days prior to transfer with localization in the middle of her chest with pressure-like feeling. Also associated with shortness of breath with exertion, nausea/vomiting and diaphoresis. Chest pain was reportedlybrought to the provider's attention the day prior to transfer and EKG was found to have concern for Q  waves. High-sensitivity troponins were noted to be elevated 325->369.Echocardiogram was obtained which showed EF of 30 to 35%. Patient has been started on heparin IV as well as evaluated by cardiology who recommended cardiac cath.  Discharge Diagnoses:  Principal Problem:   Elevated troponin Active Problems:   COPD (chronic obstructive pulmonary disease) (HCC)   CAD (coronary artery disease)   Essential hypertension   Pressure injury of skin   Acute HFrEF (heart failure with reduced ejection fraction) (HCC)   Leukocytosis   NSTEMI (non-ST elevated myocardial infarction) (HCC)   Acute metabolic encephalopathy   Hyponatremia   Hyperlipidemia with target LDL less than 70   Acute on chronic diastolic CHF/ NICM:  Echocardiogram on 08/06/2020 at [facility] showed left ventricular ejection fraction of 30 to 35% with atypical septal motion.  Hypokinesis of the anterolateral lateral wall and mid inferolateral wall. She was transferred to [facility] for cardiac catheterization which showed mild to moderate nonobstructive coronary artery disease with normal left ventricular filling pressures. Repeat echocardiogram on 08/16/2020 showed left ventricular ejection fraction of 65 to 70% with grade 1 diastolic dysfunction.  She was initially treated with IV Lasix 40 mg twice daily which was discontinued due to rising creatinine. Patient appears stable, euvolemic no need for any Lasix at this time.   Chronic respiratory failure with hypoxia, 2L [place] baseline COPD Continue with nasal cannula oxygen to keep sats greater than 88%, and bronchodilators as needed.  Hyponatremia Probably secondary to volume overload from decompensated CHF. Sodium has improved.  Hypokalemia: resolved   Recent UTI Completely treated with IV Rocephin.  Type 2 diabetes mellitus Hemoglobin A1c 6.0, well controlled.  On Ozempic and Januvia 25 mg p.o. daily outpatient.  Resume home medications on discharge  CKD  stage IIIb Baseline creatinine 1.5-1.6.  Creatinine on admission at 2.1 Avoid nephrotoxins at this time  Hyperlipidemia HDL 53, LDL 128. --Continue Crestor 40 mg p.o. daily  CAD  Continue with aspirin, Plavix and statin  Depression/anxiety Resume Abilify, duloxetine and fluoxetine.  Tobacco use disorder Continue with nicotine patch Generalized weakness Deconditioning, therapy evaluations recommending SNF waiting for a bed and insurance authorization.     Discharge Instructions   Allergies as of 08/19/2020      Reactions   Tape Other (See Comments)   SKIN IS THIN- WILL TEAR AND BRUISE EASILY!! PAPER TAPE IS TOLERATED.   Penicillins Itching   Has patient had a PCN reaction causing immediate rash, facial/tongue/throat swelling, SOB or lightheadedness with hypotension: Yes Has patient had a PCN reaction causing severe rash involving mucus membranes or skin necrosis: No Has patient had a PCN reaction that required hospitalization: No Has patient had a PCN reaction occurring within the last 10 years: No If all of the above answers are "NO", then may proceed with Cephalosporin use.      Medication List    STOP taking these medications   amLODipine 5 MG tablet Commonly known as: NORVASC   FLUoxetine 40 MG capsule Commonly known as: PROZAC   furosemide 40 MG tablet Commonly known as: LASIX   gabapentin 600 MG tablet Commonly known as: NEURONTIN   meloxicam 7.5 MG tablet Commonly known as: MOBIC   methocarbamol 500 MG tablet Commonly known as: ROBAXIN   ondansetron 4 MG disintegrating tablet Commonly known as: ZOFRAN-ODT   Oxycodone HCl 10 MG Tabs   Ozempic (0.25 or 0.5 MG/DOSE) 2 MG/1.5ML Sopn Generic drug: Semaglutide(0.25 or 0.5MG /DOS)   spironolactone 25 MG tablet Commonly known as: ALDACTONE   traZODone 50 MG tablet Commonly known as: DESYREL     TAKE these medications   albuterol 108 (90 Base) MCG/ACT inhaler Commonly known as: VENTOLIN  HFA Inhale 2 puffs into the lungs See admin instructions. Inhale 2 puffs into the lungs two to three times a day   ALPRAZolam 0.25 MG tablet Commonly known as: XANAX Take 1 tablet (0.25 mg total) by mouth 3 (three) times daily as needed for anxiety or sleep. What changed:   medication strength  how much to take  when to take this  reasons to take this   ARIPiprazole 5 MG tablet Commonly known as: ABILIFY Take 1 tablet (5 mg total) by mouth at bedtime.   aspirin EC 81 MG tablet Take 81 mg by mouth in the morning.   clopidogrel 75 MG tablet Commonly known as: PLAVIX Take 75 mg by mouth in the morning.   diclofenac sodium 1 % Gel Commonly known as: VOLTAREN Apply 2 g topically every 4 (four) hours as needed (for pain).   diphenhydrAMINE 25 mg capsule Commonly known as: BENADRYL Take 25 mg by mouth daily as needed for allergies.   DULoxetine 60 MG capsule Commonly known as: Cymbalta Take 1 capsule (60 mg total) by mouth daily. What changed: when to take this   ipratropium-albuterol 0.5-2.5 (3) MG/3ML Soln Commonly known as: DUONEB Inhale 3 mLs into the lungs every 6 (six) hours as needed (for shortness of breath or wheezing).   Januvia 25 MG tablet Generic drug: sitaGLIPtin Take 25 mg by mouth in the morning.   magnesium oxide 400 (241.3 Mg) MG tablet Commonly known as: MAG-OX TAKE 1 TABLET BY MOUTH TWICE DAILY What changed: when to take this  metoprolol tartrate 25 MG tablet Commonly known as: LOPRESSOR Take 0.5 tablets (12.5 mg total) by mouth 2 (two) times daily.   nicotine 14 mg/24hr patch Commonly known as: NICODERM CQ - dosed in mg/24 hours Place 1 patch (14 mg total) onto the skin daily. Start taking on: Aug 20, 2020   OXYGEN Inhale 2 L/min into the lungs as needed (for shortness of breath).   rosuvastatin 40 MG tablet Commonly known as: CRESTOR Take 1 tablet (40 mg total) by mouth daily. Start taking on: Aug 20, 2020 What changed:    medication strength  how much to take   Spiriva HandiHaler 18 MCG inhalation capsule Generic drug: tiotropium Place 1 capsule into inhaler and inhale daily.   Vitamin D (Ergocalciferol) 1.25 MG (50000 UNIT) Caps capsule Commonly known as: DRISDOL Take 50,000 Units by mouth every 7 (seven) days.       Contact information for after-discharge care    Destination    [facility] Preferred SNF .   Service: Skilled Nursing Contact information: [address] [place] [id number] [phone]                 Allergies  Allergen Reactions  . Tape Other (See Comments)    SKIN IS THIN- WILL TEAR AND BRUISE EASILY!! PAPER TAPE IS TOLERATED.  Marland Kitchen. Penicillins Itching    Has patient had a PCN reaction causing immediate rash, facial/tongue/throat swelling, SOB or lightheadedness with hypotension: Yes Has patient had a PCN reaction causing severe rash involving mucus membranes or skin necrosis: No Has patient had a PCN reaction that required hospitalization: No Has patient had a PCN reaction occurring within the last 10 years: No If all of the above answers are "NO", then may proceed with Cephalosporin use.     Consultations:  Cardiology.   Procedures/Studies: CT HEAD WO CONTRAST  Result Date: 08/18/2020 CLINICAL DATA:  Stroke follow-up.  Dizziness. EXAM: CT HEAD WITHOUT CONTRAST TECHNIQUE: Contiguous axial images were obtained from the base of the skull through the vertex without intravenous contrast. COMPARISON:  Head CT 05/03/2017 and MRI 05/04/2017 FINDINGS: Brain: There is no evidence of an acute infarct, intracranial hemorrhage, mass, midline shift, or extra-axial fluid collection. Small chronic cortical infarcts are again noted laterally along the left cerebral hemisphere consistent with MCA/watershed infarcts with mild progression from the prior studies in the left frontal lobe. There is mild cerebral atrophy. Vascular: Calcified atherosclerosis at  the skull base. No hyperdense vessel. Skull: No fracture or suspicious osseous lesion. Sinuses/Orbits: Paranasal sinuses and mastoid air cells are clear. Bilateral cataract extraction. Other: None. IMPRESSION: 1. No evidence of acute intracranial abnormality. 2. Chronic left MCA/watershed infarcts, mildly progressed from 2019. Electronically Signed   By: Sebastian AcheAllen  Grady M.D.   On: 08/18/2020 16:44   CARDIAC CATHETERIZATION  Result Date: 08/16/2020 Conclusions: 1. Mild to moderate, non-obstructive coronary artery disease. 2. Normal left ventricular filling pressure. Recommendations: 1. Medical therapy of nonischemic cardiomyopathy and risk factor modification to prevent progression of coronary artery disease.  LVEDP suggests that the patient is euvolemic. Yvonne Kendallhristopher End, MD [facility]   ECHOCARDIOGRAM LIMITED  Result Date: 08/16/2020    ECHOCARDIOGRAM LIMITED REPORT   Patient Name:   Mariah White Date of Exam: 08/16/2020 Medical Rec #:  [id number]     Height:       62.0 in Accession #:    [id number]    Weight:       140.6 lb Date of Birth:  Jun 22, 1950  BSA:          1.646 m Patient Age:    69 years      BP:           105/55 mmHg Patient Gender: F             HR:           64 bpm. Exam Location:  Inpatient Procedure: 2D Echo, Cardiac Doppler and Color Doppler Indications:    CHF  History:        Patient has prior history of Echocardiogram examinations, most                 recent 08/14/2016. CAD, COPD and Stroke; Risk Factors:Current                 Smoker and Hypertension.  Sonographer:    Shirlean Kelly Referring Phys: [phone] MICHAEL COOPER IMPRESSIONS  1. Left ventricular ejection fraction, by estimation, is 65 to 70%. The left ventricle has normal function. Left ventricular diastolic parameters are consistent with Grade I diastolic dysfunction (impaired relaxation).  2. Right ventricular systolic function is normal. The right ventricular size is normal.  3. Left atrial size was mildly dilated.  4.  Trivial mitral valve regurgitation.  5. The aortic valve is normal in structure. Aortic valve regurgitation is not visualized.  6. The inferior vena cava is normal in size with greater than 50% respiratory variability, suggesting right atrial pressure of 3 mmHg. FINDINGS  Left Ventricle: Left ventricular ejection fraction, by estimation, is 65 to 70%. The left ventricle has normal function. Left ventricular diastolic parameters are consistent with Grade I diastolic dysfunction (impaired relaxation). Right Ventricle: The right ventricular size is normal. Right ventricular systolic function is normal. Left Atrium: Left atrial size was mildly dilated. Right Atrium: Right atrial size was normal in size. Pericardium: Trivial pericardial effusion is present. Mitral Valve: Mild mitral annular calcification. Trivial mitral valve regurgitation. Tricuspid Valve: The tricuspid valve is normal in structure. Tricuspid valve regurgitation is trivial. Aortic Valve: The aortic valve is normal in structure. Aortic valve regurgitation is not visualized. Aortic valve mean gradient measures 4.0 mmHg. Aortic valve peak gradient measures 8.1 mmHg. Aortic valve area, by VTI measures 2.45 cm. Pulmonic Valve: The pulmonic valve was normal in structure. Pulmonic valve regurgitation is mild. Aorta: The aortic root and ascending aorta are structurally normal, with no evidence of dilitation. Venous: The inferior vena cava is normal in size with greater than 50% respiratory variability, suggesting right atrial pressure of 3 mmHg. IAS/Shunts: No atrial level shunt detected by color flow Doppler. LEFT VENTRICLE PLAX 2D LVIDd:         4.60 cm  Diastology LVIDs:         3.40 cm  LV e' medial:    5.55 cm/s LV PW:         0.90 cm  LV E/e' medial:  14.3 LV IVS:        0.90 cm  LV e' lateral:   6.31 cm/s LVOT diam:     2.00 cm  LV E/e' lateral: 12.6 LV SV:         71 LV SV Index:   43 LVOT Area:     3.14 cm  RIGHT VENTRICLE            IVC RV Basal  diam:  2.80 cm    IVC diam: 1.00 cm RV S prime:     9.03 cm/s TAPSE (M-mode): 1.6 cm  LEFT ATRIUM             Index       RIGHT ATRIUM          Index LA diam:        3.40 cm 2.07 cm/m  RA Area:     9.60 cm LA Vol (A2C):   44.5 ml 27.04 ml/m RA Volume:   19.90 ml 12.09 ml/m LA Vol (A4C):   57.0 ml 34.63 ml/m LA Biplane Vol: 52.3 ml 31.78 ml/m  AORTIC VALVE AV Area (Vmax):    2.41 cm AV Area (Vmean):   2.31 cm AV Area (VTI):     2.45 cm AV Vmax:           142.00 cm/s AV Vmean:          92.800 cm/s AV VTI:            0.288 m AV Peak Grad:      8.1 mmHg AV Mean Grad:      4.0 mmHg LVOT Vmax:         109.00 cm/s LVOT Vmean:        68.300 cm/s LVOT VTI:          0.225 m LVOT/AV VTI ratio: 0.78  AORTA Ao Root diam: 2.80 cm Ao Asc diam:  3.10 cm MITRAL VALVE MV Area (PHT): 4.06 cm    SHUNTS MV Decel Time: 187 msec    Systemic VTI:  0.22 m MV E velocity: 79.20 cm/s  Systemic Diam: 2.00 cm MV A velocity: 92.70 cm/s MV E/A ratio:  0.85 Dietrich Pates MD Electronically signed by Dietrich Pates MD Signature Date/Time: 08/16/2020/7:05:58 PM    Final       Subjective:  No new complaints.  Discharge Exam: Vitals:   08/18/20 2040 08/19/20 0459  BP: (!) 112/59 136/68  Pulse: 67 (!) 59  Resp:  17  Temp:  97.8 F (36.6 C)  SpO2:  98%   Vitals:   08/18/20 1115 08/18/20 1916 08/18/20 2040 08/19/20 0459  BP: (!) 111/51 114/67 (!) 112/59 136/68  Pulse: 63 66 67 (!) 59  Resp: 16 16  17   Temp: 97.8 F (36.6 C) 97.8 F (36.6 C)  97.8 F (36.6 C)  TempSrc: Oral Oral  Oral  SpO2: 100% 98%  98%  Weight:    65.3 kg  Height:        General: Pt is alert, awake, not in acute distress Cardiovascular: RRR, S1/S2 +, no rubs, no gallops Respiratory: CTA bilaterally, no wheezing, no rhonchi Abdominal: Soft, NT, ND, bowel sounds + Extremities: no edema, no cyanosis    The results of significant diagnostics from this hospitalization (including imaging, microbiology, ancillary and laboratory) are listed below for  reference.     Microbiology: Recent Results (from the past 240 hour(s))  SARS CORONAVIRUS 2 (TAT 6-24 HRS) Nasopharyngeal Nasopharyngeal Swab     Status: None   Collection Time: 08/18/20 11:39 AM   Specimen: Nasopharyngeal Swab  Result Value Ref Range Status   SARS Coronavirus 2 NEGATIVE NEGATIVE Final    Comment: (NOTE) SARS-CoV-2 target nucleic acids are NOT DETECTED.  The SARS-CoV-2 RNA is generally detectable in upper and lower respiratory specimens during the acute phase of infection. Negative results do not preclude SARS-CoV-2 infection, do not rule out co-infections with other pathogens, and should not be used as the sole basis for treatment or other patient management decisions. Negative results must be combined with clinical observations, patient history, and  epidemiological information. The expected result is Negative.  Fact Sheet for Patients: HairSlick.no  Fact Sheet for Healthcare Providers: quierodirigir.com  This test is not yet approved or cleared by the [place] FDA and  has been authorized for detection and/or diagnosis of SARS-CoV-2 by FDA under an Emergency Use Authorization (EUA). This EUA will remain  in effect (meaning this test can be used) for the duration of the COVID-19 declaration under Se ction 564(b)(1) of the Act, 21 U.S.C. section 360bbb-3(b)(1), unless the authorization is terminated or revoked sooner.  Performed at [facility], [address] [place], [place] [address]      Labs: BNP (last 3 results) Recent Labs    08/13/20 0011  BNP 1,089.8*   Basic Metabolic Panel: Recent Labs  Lab 08/14/20 0340 08/15/20 0015 08/16/20 0255 08/17/20 0219 08/18/20 0308  NA 131* 131* 135 136 134*  K 3.3* 4.1 4.2 4.0 4.0  CL 93* 99 104 106 105  CO2 28 26 28 26 23   GLUCOSE 91 103* 90 96 87  BUN 56* 53* 30* 19 17  CREATININE 1.59* 1.30* 1.12* 0.93 0.97  CALCIUM 8.4* 8.4* 8.5*  8.3* 8.5*  MG 1.9 2.0 2.1  --   --    Liver Function Tests: Recent Labs  Lab 08/12/20 1943  AST 31  ALT 24  ALKPHOS 93  BILITOT 0.9  PROT 7.1  ALBUMIN 2.8*   No results for input(s): LIPASE, AMYLASE in the last 168 hours. No results for input(s): AMMONIA in the last 168 hours. CBC: Recent Labs  Lab 08/13/20 0011 08/14/20 0340 08/15/20 0015 08/16/20 0255 08/17/20 0219  WBC 11.3* 10.6* 10.6* 12.1* 8.3  HGB 14.3 13.6 12.2 11.7* 10.8*  HCT 43.6 42.1 37.5 38.1 34.5*  MCV 85.8 87.0 88.2 91.6 91.8  PLT 318 226 206 199 194   Cardiac Enzymes: No results for input(s): CKTOTAL, CKMB, CKMBINDEX, TROPONINI in the last 168 hours. BNP: Invalid input(s): POCBNP CBG: Recent Labs  Lab 08/18/20 1117 08/18/20 1603 08/18/20 2258 08/19/20 0804 08/19/20 1211  GLUCAP 131* 122* 112* 93 163*   D-Dimer No results for input(s): DDIMER in the last 72 hours. Hgb A1c No results for input(s): HGBA1C in the last 72 hours. Lipid Profile No results for input(s): CHOL, HDL, LDLCALC, TRIG, CHOLHDL, LDLDIRECT in the last 72 hours. Thyroid function studies No results for input(s): TSH, T4TOTAL, T3FREE, THYROIDAB in the last 72 hours.  Invalid input(s): FREET3 Anemia work up No results for input(s): VITAMINB12, FOLATE, FERRITIN, TIBC, IRON, RETICCTPCT in the last 72 hours. Urinalysis    Component Value Date/Time   COLORURINE YELLOW 05/03/2017 1554   APPEARANCEUR CLEAR 05/03/2017 1554   LABSPEC 1.014 05/03/2017 1554   PHURINE 5.0 05/03/2017 1554   GLUCOSEU NEGATIVE 05/03/2017 1554   HGBUR NEGATIVE 05/03/2017 1554   BILIRUBINUR NEGATIVE 05/03/2017 1554   KETONESUR NEGATIVE 05/03/2017 1554   PROTEINUR NEGATIVE 05/03/2017 1554   NITRITE NEGATIVE 05/03/2017 1554   LEUKOCYTESUR NEGATIVE 05/03/2017 1554   Sepsis Labs Invalid input(s): PROCALCITONIN,  WBC,  LACTICIDVEN Microbiology Recent Results (from the past 240 hour(s))  SARS CORONAVIRUS 2 (TAT 6-24 HRS) Nasopharyngeal Nasopharyngeal  Swab     Status: None   Collection Time: 08/18/20 11:39 AM   Specimen: Nasopharyngeal Swab  Result Value Ref Range Status   SARS Coronavirus 2 NEGATIVE NEGATIVE Final    Comment: (NOTE) SARS-CoV-2 target nucleic acids are NOT DETECTED.  The SARS-CoV-2 RNA is generally detectable in upper and lower respiratory specimens during the  acute phase of infection. Negative results do not preclude SARS-CoV-2 infection, do not rule out co-infections with other pathogens, and should not be used as the sole basis for treatment or other patient management decisions. Negative results must be combined with clinical observations, patient history, and epidemiological information. The expected result is Negative.  Fact Sheet for Patients: HairSlick.no  Fact Sheet for Healthcare Providers: quierodirigir.com  This test is not yet approved or cleared by the [place] FDA and  has been authorized for detection and/or diagnosis of SARS-CoV-2 by FDA under an Emergency Use Authorization (EUA). This EUA will remain  in effect (meaning this test can be used) for the duration of the COVID-19 declaration under Se ction 564(b)(1) of the Act, 21 U.S.C. section 360bbb-3(b)(1), unless the authorization is terminated or revoked sooner.  Performed at [facility], [address] [place], [place] [address]      Time coordinating discharge: 36 minutes.  SIGNED:   Kathlen Mody, MD  [facility] 08/19/2020, 4:21 PM

## 2020-08-19 NOTE — Progress Notes (Signed)
Physical Therapy Treatment Patient Details Name: Mariah White MRN: [id number] DOB: 1950-09-09 Today's Date: 08/19/2020    History of Present Illness 70 y.o. female presents to [facility] on 08/12/2020 as a transfer from [place] with reports of chest and L hip pain after fall. Head CT with no acute abnormalities. Pelvic xray negative for fx. EKG with concern for Q waves; high-sensitivity troponins elevated. Workup for newly dx acute systolic CHF. PMH includes HTN, diastolic CHF, CAD, CVA, COPD (2L O2 baseline)   PT Comments    Pt progressing with mobility. Tolerated multiple bouts of bed mobility, as well as standing and pivot transfer, requiring modA+1 to maxA+2 for mobility. Pt with difficulty initiating steps this session, but able to maintain static standign balance with external assist. Pt limited by generalized weakness, decreased activity tolerance, poor balance strategies and cognitive impairment. Improved affect and participation this session. Continue to recommend SNF-level therapies to maximize functional mobility and independence prior to return home.    Follow Up Recommendations  SNF;Supervision/Assistance - 24 hour     Equipment Recommendations  None recommended by PT    Recommendations for Other Services       Precautions / Restrictions Precautions Precautions: Fall;Other (comment) Precaution Comments: Bladder/bowel incontinence Restrictions Weight Bearing Restrictions: No    Mobility  Bed Mobility Overal bed mobility: Needs Assistance Bed Mobility: Rolling;Sidelying to Sit Rolling: Min assist;Mod assist Sidelying to sit: Mod assist       General bed mobility comments: Multiple rolls onto R-side with use of bed rail, min-modA for full pelvic rotation; sidelying to sit with modA for trunk elevation and scooting hips to EOB    Transfers Overall transfer level: Needs assistance Equipment used: 2 person hand held assist Transfers: Sit to/from [employer] Sit to Stand: Mod assist;+2 physical assistance Stand pivot transfers: Max assist;+2 physical assistance       General transfer comment: ModA+2 to elevate trunk and maintain balance, cues for upright posture; pt with difficulty taking complete steps, maxA+2 for pivot to recliner with BUE support  Ambulation/Gait                 Stairs             Wheelchair Mobility    Modified Rankin (Stroke Patients Only)       Balance Overall balance assessment: Needs assistance Sitting-balance support: Feet supported;No upper extremity supported;Feet unsupported Sitting balance-Leahy Scale: Fair Sitting balance - Comments: Able to maintain static sitting without UE support     Standing balance-Leahy Scale: Poor Standing balance comment: Reliant on external assist and BUE support                            Cognition Arousal/Alertness: Awake/alert Behavior During Therapy: Flat affect Overall Cognitive Status: No family/caregiver present to determine baseline cognitive functioning Area of Impairment: Following commands;Safety/judgement;Problem solving;Attention;Memory;Awareness                   Current Attention Level: Sustained   Following Commands: Follows one step commands with increased time Safety/Judgement: Decreased awareness of safety;Decreased awareness of deficits Awareness: Intellectual Problem Solving: Slow processing;Decreased initiation;Difficulty sequencing;Requires verbal cues;Requires tactile cues General Comments: Much improved cognition and participation this session, following majority of simple commands with increased time      Exercises      General Comments General comments (skin integrity, edema, etc.): Pt with bowel incontinence, dependent for pericare and linen change, assist for  dressing. SpO2 96% on RA      Pertinent Vitals/Pain Pain Assessment: Faces Faces Pain Scale: Hurts little more Pain Location:  Lower back, BLEs, sacral area with pericare Pain Descriptors / Indicators: Discomfort;Sore Pain Intervention(s): Monitored during session;Limited activity within patient's tolerance;Repositioned    Home Living                      Prior Function            PT Goals (current goals can now be found in the care plan section) Progress towards PT goals: Progressing toward goals    Frequency    Min 2X/week      PT Plan Current plan remains appropriate    Co-evaluation              AM-PAC PT "6 Clicks" Mobility   Outcome Measure  Help needed turning from your back to your side while in a flat bed without using bedrails?: A Lot Help needed moving from lying on your back to sitting on the side of a flat bed without using bedrails?: A Lot Help needed moving to and from a bed to a chair (including a wheelchair)?: Total Help needed standing up from a chair using your arms (e.g., wheelchair or bedside chair)?: A Lot Help needed to walk in hospital room?: Total Help needed climbing 3-5 steps with a railing? : Total 6 Click Score: 9    End of Session   Activity Tolerance: Patient tolerated treatment well Patient left: in chair;with call bell/phone within reach;with nursing/sitter in room Nurse Communication: Mobility status PT Visit Diagnosis: Other abnormalities of gait and mobility (R26.89);Muscle weakness (generalized) (M62.81);Difficulty in walking, not elsewhere classified (R26.2);History of falling (Z91.81)     Time: 2119-4174 PT Time Calculation (min) (ACUTE ONLY): 18 min  Charges:  $Therapeutic Activity: 8-22 mins                     Ina Homes, PT, DPT Acute Rehabilitation Services  Pager [phone] Office [phone]  Malachy Chamber 08/19/2020, 12:08 PM

## 2020-08-20 DIAGNOSIS — Z20822 Contact with and (suspected) exposure to covid-19: Secondary | ICD-10-CM | POA: Diagnosis not present

## 2020-08-20 DIAGNOSIS — M6281 Muscle weakness (generalized): Secondary | ICD-10-CM | POA: Diagnosis not present

## 2020-08-20 DIAGNOSIS — K219 Gastro-esophageal reflux disease without esophagitis: Secondary | ICD-10-CM | POA: Diagnosis not present

## 2020-08-20 DIAGNOSIS — Z743 Need for continuous supervision: Secondary | ICD-10-CM | POA: Diagnosis not present

## 2020-08-20 DIAGNOSIS — N390 Urinary tract infection, site not specified: Secondary | ICD-10-CM | POA: Diagnosis not present

## 2020-08-20 DIAGNOSIS — R0902 Hypoxemia: Secondary | ICD-10-CM | POA: Diagnosis not present

## 2020-08-20 DIAGNOSIS — M1380 Other specified arthritis, unspecified site: Secondary | ICD-10-CM | POA: Diagnosis not present

## 2020-08-20 DIAGNOSIS — J449 Chronic obstructive pulmonary disease, unspecified: Secondary | ICD-10-CM | POA: Diagnosis not present

## 2020-08-20 DIAGNOSIS — G43909 Migraine, unspecified, not intractable, without status migrainosus: Secondary | ICD-10-CM | POA: Diagnosis not present

## 2020-08-20 DIAGNOSIS — J9611 Chronic respiratory failure with hypoxia: Secondary | ICD-10-CM | POA: Diagnosis not present

## 2020-08-20 DIAGNOSIS — R0689 Other abnormalities of breathing: Secondary | ICD-10-CM | POA: Diagnosis not present

## 2020-08-20 DIAGNOSIS — E7800 Pure hypercholesterolemia, unspecified: Secondary | ICD-10-CM | POA: Diagnosis not present

## 2020-08-20 DIAGNOSIS — I2510 Atherosclerotic heart disease of native coronary artery without angina pectoris: Secondary | ICD-10-CM | POA: Diagnosis not present

## 2020-08-20 DIAGNOSIS — E871 Hypo-osmolality and hyponatremia: Secondary | ICD-10-CM | POA: Diagnosis not present

## 2020-08-20 DIAGNOSIS — N3001 Acute cystitis with hematuria: Secondary | ICD-10-CM | POA: Diagnosis not present

## 2020-08-20 DIAGNOSIS — W19XXXD Unspecified fall, subsequent encounter: Secondary | ICD-10-CM | POA: Diagnosis not present

## 2020-08-20 DIAGNOSIS — Z7982 Long term (current) use of aspirin: Secondary | ICD-10-CM | POA: Diagnosis not present

## 2020-08-20 DIAGNOSIS — R6889 Other general symptoms and signs: Secondary | ICD-10-CM | POA: Diagnosis not present

## 2020-08-20 DIAGNOSIS — Z7401 Bed confinement status: Secondary | ICD-10-CM | POA: Diagnosis not present

## 2020-08-20 DIAGNOSIS — F17210 Nicotine dependence, cigarettes, uncomplicated: Secondary | ICD-10-CM | POA: Diagnosis not present

## 2020-08-20 DIAGNOSIS — E119 Type 2 diabetes mellitus without complications: Secondary | ICD-10-CM | POA: Diagnosis not present

## 2020-08-20 DIAGNOSIS — N1832 Chronic kidney disease, stage 3b: Secondary | ICD-10-CM | POA: Diagnosis not present

## 2020-08-20 DIAGNOSIS — I130 Hypertensive heart and chronic kidney disease with heart failure and stage 1 through stage 4 chronic kidney disease, or unspecified chronic kidney disease: Secondary | ICD-10-CM | POA: Diagnosis not present

## 2020-08-20 DIAGNOSIS — S81819A Laceration without foreign body, unspecified lower leg, initial encounter: Secondary | ICD-10-CM | POA: Diagnosis not present

## 2020-08-20 DIAGNOSIS — I214 Non-ST elevation (NSTEMI) myocardial infarction: Secondary | ICD-10-CM | POA: Diagnosis not present

## 2020-08-20 DIAGNOSIS — R509 Fever, unspecified: Secondary | ICD-10-CM | POA: Diagnosis not present

## 2020-08-20 DIAGNOSIS — I10 Essential (primary) hypertension: Secondary | ICD-10-CM | POA: Diagnosis not present

## 2020-08-20 DIAGNOSIS — N179 Acute kidney failure, unspecified: Secondary | ICD-10-CM | POA: Diagnosis not present

## 2020-08-20 DIAGNOSIS — N183 Chronic kidney disease, stage 3 unspecified: Secondary | ICD-10-CM | POA: Diagnosis not present

## 2020-08-20 DIAGNOSIS — R059 Cough, unspecified: Secondary | ICD-10-CM | POA: Diagnosis not present

## 2020-08-20 DIAGNOSIS — I509 Heart failure, unspecified: Secondary | ICD-10-CM | POA: Diagnosis not present

## 2020-08-20 DIAGNOSIS — E872 Acidosis: Secondary | ICD-10-CM | POA: Diagnosis not present

## 2020-08-20 DIAGNOSIS — G894 Chronic pain syndrome: Secondary | ICD-10-CM | POA: Diagnosis not present

## 2020-08-20 DIAGNOSIS — G9341 Metabolic encephalopathy: Secondary | ICD-10-CM | POA: Diagnosis not present

## 2020-08-20 DIAGNOSIS — I69391 Dysphagia following cerebral infarction: Secondary | ICD-10-CM | POA: Diagnosis not present

## 2020-08-20 DIAGNOSIS — E43 Unspecified severe protein-calorie malnutrition: Secondary | ICD-10-CM | POA: Diagnosis not present

## 2020-08-20 DIAGNOSIS — J9610 Chronic respiratory failure, unspecified whether with hypoxia or hypercapnia: Secondary | ICD-10-CM | POA: Diagnosis not present

## 2020-08-20 DIAGNOSIS — Z66 Do not resuscitate: Secondary | ICD-10-CM | POA: Diagnosis not present

## 2020-08-20 DIAGNOSIS — I5032 Chronic diastolic (congestive) heart failure: Secondary | ICD-10-CM | POA: Diagnosis not present

## 2020-08-20 DIAGNOSIS — G4733 Obstructive sleep apnea (adult) (pediatric): Secondary | ICD-10-CM | POA: Diagnosis not present

## 2020-08-20 DIAGNOSIS — Z7901 Long term (current) use of anticoagulants: Secondary | ICD-10-CM | POA: Diagnosis not present

## 2020-08-20 DIAGNOSIS — N189 Chronic kidney disease, unspecified: Secondary | ICD-10-CM | POA: Diagnosis not present

## 2020-08-20 DIAGNOSIS — Z9181 History of falling: Secondary | ICD-10-CM | POA: Diagnosis not present

## 2020-08-20 DIAGNOSIS — E785 Hyperlipidemia, unspecified: Secondary | ICD-10-CM | POA: Diagnosis not present

## 2020-08-20 DIAGNOSIS — E875 Hyperkalemia: Secondary | ICD-10-CM | POA: Diagnosis not present

## 2020-08-20 DIAGNOSIS — E876 Hypokalemia: Secondary | ICD-10-CM | POA: Diagnosis not present

## 2020-08-20 DIAGNOSIS — R410 Disorientation, unspecified: Secondary | ICD-10-CM | POA: Diagnosis not present

## 2020-08-20 DIAGNOSIS — M1712 Unilateral primary osteoarthritis, left knee: Secondary | ICD-10-CM | POA: Diagnosis not present

## 2020-08-20 DIAGNOSIS — I5033 Acute on chronic diastolic (congestive) heart failure: Secondary | ICD-10-CM | POA: Diagnosis not present

## 2020-08-20 DIAGNOSIS — A419 Sepsis, unspecified organism: Secondary | ICD-10-CM | POA: Diagnosis not present

## 2020-08-20 DIAGNOSIS — E1122 Type 2 diabetes mellitus with diabetic chronic kidney disease: Secondary | ICD-10-CM | POA: Diagnosis not present

## 2020-08-20 DIAGNOSIS — E1140 Type 2 diabetes mellitus with diabetic neuropathy, unspecified: Secondary | ICD-10-CM | POA: Diagnosis not present

## 2020-08-20 DIAGNOSIS — N3000 Acute cystitis without hematuria: Secondary | ICD-10-CM | POA: Diagnosis not present

## 2020-08-20 DIAGNOSIS — I5021 Acute systolic (congestive) heart failure: Secondary | ICD-10-CM | POA: Diagnosis not present

## 2020-08-20 DIAGNOSIS — I129 Hypertensive chronic kidney disease with stage 1 through stage 4 chronic kidney disease, or unspecified chronic kidney disease: Secondary | ICD-10-CM | POA: Diagnosis not present

## 2020-08-20 DIAGNOSIS — I499 Cardiac arrhythmia, unspecified: Secondary | ICD-10-CM | POA: Diagnosis not present

## 2020-08-20 DIAGNOSIS — E559 Vitamin D deficiency, unspecified: Secondary | ICD-10-CM | POA: Diagnosis not present

## 2020-08-20 DIAGNOSIS — Z7902 Long term (current) use of antithrombotics/antiplatelets: Secondary | ICD-10-CM | POA: Diagnosis not present

## 2020-08-20 DIAGNOSIS — I6389 Other cerebral infarction: Secondary | ICD-10-CM | POA: Diagnosis not present

## 2020-08-20 LAB — GLUCOSE, CAPILLARY
Glucose-Capillary: 90 mg/dL (ref 70–99)
Glucose-Capillary: 135 mg/dL — ABNORMAL HIGH (ref 70–99)

## 2020-08-20 MED ORDER — ALPRAZOLAM 0.25 MG PO TABS: 0.2500 mg | ORAL_TABLET | Freq: Three times a day (TID) | ORAL | 0 refills | Status: DC | PRN

## 2020-08-20 NOTE — Discharge Summary (Signed)
Physician Discharge Summary  Mariah White WUJ:[id number] DOB: 01/05/1951 DOA: 08/12/2020  PCP: [facility]  Admit date: 08/12/2020 Discharge date: 08/20/2020  Admitted From: Home Disposition: SNF  Recommendations for Outpatient Follow-up:  1. Follow up with PCP in 1-2 weeks 2. Please obtain BMP/CBC in one week Please follow up with cardiology as needed    Discharge Condition: Stable  CODE STATUS: Full code Diet recommendation: Heart Healthy   Brief/Interim Summary: Mariah Gergen Wootenis a 70 year old female with past medical history significant for essential hypertension, chronic diastolic congestive heart failure, CAD, prior CVA, COPD on 2 L nasal cannula oxygen at baseline presents as a transfer from [facility] for chest pain. Patient had been hospitalized at [facility] from 5/6-08/2010; after initially presenting following fall at home with complaints of left hip pain. She reports at that time she was reaching for cigarettes while in bed and fell without loss of consciousness. CT scan of her head without contrast did not show any acute intracranial abnormalities and x-rays pelvis with no acute fractures. While in the ED at [facility] ER, patient reported to complain of some dysuria and was noted to be with some mild hypertension. Hypotension resolved with IV fluids and she was started on IV antibiotics for urinary tract infection with ceftriaxone. During her hospital stay, patient has been noted to be intermittently confused at times with plan to transfer patient to a rehab facility due to ongoing weakness. Patient then started to complain of chest pain 2-3 days prior to transfer with localization in the middle of her chest with pressure-like feeling. Also associated with shortness of breath with exertion, nausea/vomiting and diaphoresis. Chest pain was reportedlybrought to the provider's attention the day prior to transfer and EKG was found to have concern for Q  waves. High-sensitivity troponins were noted to be elevated 325->369.Echocardiogram was obtained which showed EF of 30 to 35%. Patient has been started on heparin IV as well as evaluated by cardiology who recommended cardiac cath.  Discharge Diagnoses:  Principal Problem:   Elevated troponin Active Problems:   COPD (chronic obstructive pulmonary disease) (HCC)   CAD (coronary artery disease)   Essential hypertension   Pressure injury of skin   Acute HFrEF (heart failure with reduced ejection fraction) (HCC)   Leukocytosis   NSTEMI (non-ST elevated myocardial infarction) (HCC)   Acute metabolic encephalopathy   Hyponatremia   Hyperlipidemia with target LDL less than 70   Acute on chronic diastolic CHF/ NICM:  Echocardiogram on 08/06/2020 at [facility] showed left ventricular ejection fraction of 30 to 35% with atypical septal motion.  Hypokinesis of the anterolateral lateral wall and mid inferolateral wall. She was transferred to [facility] for cardiac catheterization which showed mild to moderate nonobstructive coronary artery disease with normal left ventricular filling pressures. Repeat echocardiogram on 08/16/2020 showed left ventricular ejection fraction of 65 to 70% with grade 1 diastolic dysfunction.  She was initially treated with IV Lasix 40 mg twice daily which was discontinued due to rising creatinine. Patient appears stable, euvolemic no need for any Lasix at this time.   Chronic respiratory failure with hypoxia, 2L [place] baseline COPD Continue with nasal cannula oxygen to keep sats greater than 88%, and bronchodilators as needed.  Hyponatremia Probably secondary to volume overload from decompensated CHF. Sodium has improved.  Hypokalemia: resolved   Recent UTI Completely treated with IV Rocephin.  Type 2 diabetes mellitus Hemoglobin A1c 6.0, well controlled.  On Ozempic and Januvia 25 mg p.o. daily outpatient.  Resume home medications on discharge  CKD  stage IIIb Baseline creatinine 1.5-1.6.  Creatinine on admission at 2.1 Avoid nephrotoxins at this time  Hyperlipidemia HDL 53, LDL 128. --Continue Crestor 40 mg p.o. daily  CAD  Continue with aspirin, Plavix and statin  Depression/anxiety Resume Abilify, duloxetine and fluoxetine.  Tobacco use disorder Continue with nicotine patch Generalized weakness Deconditioning, therapy evaluations recommending SNF waiting for a bed and insurance authorization.     Discharge Instructions   Allergies as of 08/20/2020      Reactions   Tape Other (See Comments)   SKIN IS THIN- WILL TEAR AND BRUISE EASILY!! PAPER TAPE IS TOLERATED.   Penicillins Itching   Has patient had a PCN reaction causing immediate rash, facial/tongue/throat swelling, SOB or lightheadedness with hypotension: Yes Has patient had a PCN reaction causing severe rash involving mucus membranes or skin necrosis: No Has patient had a PCN reaction that required hospitalization: No Has patient had a PCN reaction occurring within the last 10 years: No If all of the above answers are "NO", then may proceed with Cephalosporin use.      Medication List    STOP taking these medications   amLODipine 5 MG tablet Commonly known as: NORVASC   FLUoxetine 40 MG capsule Commonly known as: PROZAC   furosemide 40 MG tablet Commonly known as: LASIX   gabapentin 600 MG tablet Commonly known as: NEURONTIN   meloxicam 7.5 MG tablet Commonly known as: MOBIC   methocarbamol 500 MG tablet Commonly known as: ROBAXIN   ondansetron 4 MG disintegrating tablet Commonly known as: ZOFRAN-ODT   Oxycodone HCl 10 MG Tabs   Ozempic (0.25 or 0.5 MG/DOSE) 2 MG/1.5ML Sopn Generic drug: Semaglutide(0.25 or 0.5MG /DOS)   spironolactone 25 MG tablet Commonly known as: ALDACTONE   traZODone 50 MG tablet Commonly known as: DESYREL     TAKE these medications   albuterol 108 (90 Base) MCG/ACT inhaler Commonly known as: VENTOLIN  HFA Inhale 2 puffs into the lungs See admin instructions. Inhale 2 puffs into the lungs two to three times a day   ALPRAZolam 0.25 MG tablet Commonly known as: XANAX Take 1 tablet (0.25 mg total) by mouth 3 (three) times daily as needed for anxiety or sleep. What changed:   medication strength  how much to take  when to take this  reasons to take this   ARIPiprazole 5 MG tablet Commonly known as: ABILIFY Take 1 tablet (5 mg total) by mouth at bedtime.   aspirin EC 81 MG tablet Take 81 mg by mouth in the morning.   clopidogrel 75 MG tablet Commonly known as: PLAVIX Take 75 mg by mouth in the morning.   diclofenac sodium 1 % Gel Commonly known as: VOLTAREN Apply 2 g topically every 4 (four) hours as needed (for pain).   diphenhydrAMINE 25 mg capsule Commonly known as: BENADRYL Take 25 mg by mouth daily as needed for allergies.   DULoxetine 60 MG capsule Commonly known as: Cymbalta Take 1 capsule (60 mg total) by mouth daily. What changed: when to take this   ipratropium-albuterol 0.5-2.5 (3) MG/3ML Soln Commonly known as: DUONEB Inhale 3 mLs into the lungs every 6 (six) hours as needed (for shortness of breath or wheezing).   Januvia 25 MG tablet Generic drug: sitaGLIPtin Take 25 mg by mouth in the morning.   magnesium oxide 400 (241.3 Mg) MG tablet Commonly known as: MAG-OX TAKE 1 TABLET BY MOUTH TWICE DAILY What changed: when to take this  metoprolol tartrate 25 MG tablet Commonly known as: LOPRESSOR Take 0.5 tablets (12.5 mg total) by mouth 2 (two) times daily.   nicotine 14 mg/24hr patch Commonly known as: NICODERM CQ - dosed in mg/24 hours Place 1 patch (14 mg total) onto the skin daily.   OXYGEN Inhale 2 L/min into the lungs as needed (for shortness of breath).   rosuvastatin 40 MG tablet Commonly known as: CRESTOR Take 1 tablet (40 mg total) by mouth daily. What changed:   medication strength  how much to take   Spiriva HandiHaler 18 MCG  inhalation capsule Generic drug: tiotropium Place 1 capsule into inhaler and inhale daily.   Vitamin D (Ergocalciferol) 1.25 MG (50000 UNIT) Caps capsule Commonly known as: DRISDOL Take 50,000 Units by mouth every 7 (seven) days.       Contact information for after-discharge care    Destination    [facility] Preferred SNF .   Service: Skilled Nursing Contact information: [address] [place] [address] [phone]                 Allergies  Allergen Reactions  . Tape Other (See Comments)    SKIN IS THIN- WILL TEAR AND BRUISE EASILY!! PAPER TAPE IS TOLERATED.  Marland Kitchen Penicillins Itching    Has patient had a PCN reaction causing immediate rash, facial/tongue/throat swelling, SOB or lightheadedness with hypotension: Yes Has patient had a PCN reaction causing severe rash involving mucus membranes or skin necrosis: No Has patient had a PCN reaction that required hospitalization: No Has patient had a PCN reaction occurring within the last 10 years: No If all of the above answers are "NO", then may proceed with Cephalosporin use.     Consultations:  Cardiology.   Procedures/Studies: CT HEAD WO CONTRAST  Result Date: 08/18/2020 CLINICAL DATA:  Stroke follow-up.  Dizziness. EXAM: CT HEAD WITHOUT CONTRAST TECHNIQUE: Contiguous axial images were obtained from the base of the skull through the vertex without intravenous contrast. COMPARISON:  Head CT 05/03/2017 and MRI 05/04/2017 FINDINGS: Brain: There is no evidence of an acute infarct, intracranial hemorrhage, mass, midline shift, or extra-axial fluid collection. Small chronic cortical infarcts are again noted laterally along the left cerebral hemisphere consistent with MCA/watershed infarcts with mild progression from the prior studies in the left frontal lobe. There is mild cerebral atrophy. Vascular: Calcified atherosclerosis at the skull base. No hyperdense vessel. Skull: No fracture or suspicious  osseous lesion. Sinuses/Orbits: Paranasal sinuses and mastoid air cells are clear. Bilateral cataract extraction. Other: None. IMPRESSION: 1. No evidence of acute intracranial abnormality. 2. Chronic left MCA/watershed infarcts, mildly progressed from 2019. Electronically Signed   By: Sebastian Ache M.D.   On: 08/18/2020 16:44   CARDIAC CATHETERIZATION  Result Date: 08/16/2020 Conclusions: 1. Mild to moderate, non-obstructive coronary artery disease. 2. Normal left ventricular filling pressure. Recommendations: 1. Medical therapy of nonischemic cardiomyopathy and risk factor modification to prevent progression of coronary artery disease.  LVEDP suggests that the patient is euvolemic. Yvonne Kendall, MD [facility]   ECHOCARDIOGRAM LIMITED  Result Date: 08/16/2020    ECHOCARDIOGRAM LIMITED REPORT   Patient Name:   [name] Date of Exam: 08/16/2020 Medical Rec #:  [id number]     Height:       62.0 in Accession #:    [id number]    Weight:       140.6 lb Date of Birth:  [birth year]    BSA:  1.646 m Patient Age:    69 years      BP:           105/55 mmHg Patient Gender: F             HR:           64 bpm. Exam Location:  Inpatient Procedure: 2D Echo, Cardiac Doppler and Color Doppler Indications:    CHF  History:        Patient has prior history of Echocardiogram examinations, most                 recent 08/14/2016. CAD, COPD and Stroke; Risk Factors:Current                 Smoker and Hypertension.  Sonographer:    Shirlean Kelly Referring Phys: [phone] MICHAEL COOPER IMPRESSIONS  1. Left ventricular ejection fraction, by estimation, is 65 to 70%. The left ventricle has normal function. Left ventricular diastolic parameters are consistent with Grade I diastolic dysfunction (impaired relaxation).  2. Right ventricular systolic function is normal. The right ventricular size is normal.  3. Left atrial size was mildly dilated.  4. Trivial mitral valve regurgitation.  5. The aortic valve is normal in  structure. Aortic valve regurgitation is not visualized.  6. The inferior vena cava is normal in size with greater than 50% respiratory variability, suggesting right atrial pressure of 3 mmHg. FINDINGS  Left Ventricle: Left ventricular ejection fraction, by estimation, is 65 to 70%. The left ventricle has normal function. Left ventricular diastolic parameters are consistent with Grade I diastolic dysfunction (impaired relaxation). Right Ventricle: The right ventricular size is normal. Right ventricular systolic function is normal. Left Atrium: Left atrial size was mildly dilated. Right Atrium: Right atrial size was normal in size. Pericardium: Trivial pericardial effusion is present. Mitral Valve: Mild mitral annular calcification. Trivial mitral valve regurgitation. Tricuspid Valve: The tricuspid valve is normal in structure. Tricuspid valve regurgitation is trivial. Aortic Valve: The aortic valve is normal in structure. Aortic valve regurgitation is not visualized. Aortic valve mean gradient measures 4.0 mmHg. Aortic valve peak gradient measures 8.1 mmHg. Aortic valve area, by VTI measures 2.45 cm. Pulmonic Valve: The pulmonic valve was normal in structure. Pulmonic valve regurgitation is mild. Aorta: The aortic root and ascending aorta are structurally normal, with no evidence of dilitation. Venous: The inferior vena cava is normal in size with greater than 50% respiratory variability, suggesting right atrial pressure of 3 mmHg. IAS/Shunts: No atrial level shunt detected by color flow Doppler. LEFT VENTRICLE PLAX 2D LVIDd:         4.60 cm  Diastology LVIDs:         3.40 cm  LV e' medial:    5.55 cm/s LV PW:         0.90 cm  LV E/e' medial:  14.3 LV IVS:        0.90 cm  LV e' lateral:   6.31 cm/s LVOT diam:     2.00 cm  LV E/e' lateral: 12.6 LV SV:         71 LV SV Index:   43 LVOT Area:     3.14 cm  RIGHT VENTRICLE            IVC RV Basal diam:  2.80 cm    IVC diam: 1.00 cm RV S prime:     9.03 cm/s TAPSE  (M-mode): 1.6 cm LEFT ATRIUM  Index       RIGHT ATRIUM          Index LA diam:        3.40 cm 2.07 cm/m  RA Area:     9.60 cm LA Vol (A2C):   44.5 ml 27.04 ml/m RA Volume:   19.90 ml 12.09 ml/m LA Vol (A4C):   57.0 ml 34.63 ml/m LA Biplane Vol: 52.3 ml 31.78 ml/m  AORTIC VALVE AV Area (Vmax):    2.41 cm AV Area (Vmean):   2.31 cm AV Area (VTI):     2.45 cm AV Vmax:           142.00 cm/s AV Vmean:          92.800 cm/s AV VTI:            0.288 m AV Peak Grad:      8.1 mmHg AV Mean Grad:      4.0 mmHg LVOT Vmax:         109.00 cm/s LVOT Vmean:        68.300 cm/s LVOT VTI:          0.225 m LVOT/AV VTI ratio: 0.78  AORTA Ao Root diam: 2.80 cm Ao Asc diam:  3.10 cm MITRAL VALVE MV Area (PHT): 4.06 cm    SHUNTS MV Decel Time: 187 msec    Systemic VTI:  0.22 m MV E velocity: 79.20 cm/s  Systemic Diam: 2.00 cm MV A velocity: 92.70 cm/s MV E/A ratio:  0.85 Dietrich Pates MD Electronically signed by Dietrich Pates MD Signature Date/Time: 08/16/2020/7:05:58 PM    Final       Subjective:  No new complaints.  Discharge Exam: Vitals:   08/19/20 2221 08/20/20 0323  BP: 116/73 125/63  Pulse: 79 63  Resp:  17  Temp:  97.8 F (36.6 C)  SpO2:  99%   Vitals:   08/19/20 1920 08/19/20 2221 08/20/20 0316 08/20/20 0323  BP: 121/71 116/73  125/63  Pulse:  79  63  Resp: 16   17  Temp: 97.9 F (36.6 C)   97.8 F (36.6 C)  TempSrc: Oral   Oral  SpO2: 99%   99%  Weight:   64.5 kg   Height:        General: Pt is alert, awake, not in acute distress Cardiovascular: RRR, S1/S2 +, no rubs, no gallops Respiratory: CTA bilaterally, no wheezing, no rhonchi Abdominal: Soft, NT, ND, bowel sounds + Extremities: no edema, no cyanosis    The results of significant diagnostics from this hospitalization (including imaging, microbiology, ancillary and laboratory) are listed below for reference.     Microbiology: Recent Results (from the past 240 hour(s))  SARS CORONAVIRUS 2 (TAT 6-24 HRS) Nasopharyngeal  Nasopharyngeal Swab     Status: None   Collection Time: 08/18/20 11:39 AM   Specimen: Nasopharyngeal Swab  Result Value Ref Range Status   SARS Coronavirus 2 NEGATIVE NEGATIVE Final    Comment: (NOTE) SARS-CoV-2 target nucleic acids are NOT DETECTED.  The SARS-CoV-2 RNA is generally detectable in upper and lower respiratory specimens during the acute phase of infection. Negative results do not preclude SARS-CoV-2 infection, do not rule out co-infections with other pathogens, and should not be used as the sole basis for treatment or other patient management decisions. Negative results must be combined with clinical observations, patient history, and epidemiological information. The expected result is Negative.  Fact Sheet for Patients: HairSlick.no  Fact Sheet for Healthcare Providers: quierodirigir.com  This  test is not yet approved or cleared by the [place] FDA and  has been authorized for detection and/or diagnosis of SARS-CoV-2 by FDA under an Emergency Use Authorization (EUA). This EUA will remain  in effect (meaning this test can be used) for the duration of the COVID-19 declaration under Se ction 564(b)(1) of the Act, 21 U.S.C. section 360bbb-3(b)(1), unless the authorization is terminated or revoked sooner.  Performed at [facility], [address] [place], [place] [address]      Labs: BNP (last 3 results) Recent Labs    08/13/20 0011  BNP 1,089.8*   Basic Metabolic Panel: Recent Labs  Lab 08/14/20 0340 08/15/20 0015 08/16/20 0255 08/17/20 0219 08/18/20 0308  NA 131* 131* 135 136 134*  K 3.3* 4.1 4.2 4.0 4.0  CL 93* 99 104 106 105  CO2 28 26 28 26 23   GLUCOSE 91 103* 90 96 87  BUN 56* 53* 30* 19 17  CREATININE 1.59* 1.30* 1.12* 0.93 0.97  CALCIUM 8.4* 8.4* 8.5* 8.3* 8.5*  MG 1.9 2.0 2.1  --   --    Liver Function Tests: No results for input(s): AST, ALT, ALKPHOS, BILITOT, PROT,  ALBUMIN in the last 168 hours. No results for input(s): LIPASE, AMYLASE in the last 168 hours. No results for input(s): AMMONIA in the last 168 hours. CBC: Recent Labs  Lab 08/14/20 0340 08/15/20 0015 08/16/20 0255 08/17/20 0219  WBC 10.6* 10.6* 12.1* 8.3  HGB 13.6 12.2 11.7* 10.8*  HCT 42.1 37.5 38.1 34.5*  MCV 87.0 88.2 91.6 91.8  PLT 226 206 199 194   Cardiac Enzymes: No results for input(s): CKTOTAL, CKMB, CKMBINDEX, TROPONINI in the last 168 hours. BNP: Invalid input(s): POCBNP CBG: Recent Labs  Lab 08/19/20 0804 08/19/20 1211 08/19/20 1635 08/19/20 2132 08/20/20 0719  GLUCAP 93 163* 114* 106* 90   D-Dimer No results for input(s): DDIMER in the last 72 hours. Hgb A1c No results for input(s): HGBA1C in the last 72 hours. Lipid Profile No results for input(s): CHOL, HDL, LDLCALC, TRIG, CHOLHDL, LDLDIRECT in the last 72 hours. Thyroid function studies No results for input(s): TSH, T4TOTAL, T3FREE, THYROIDAB in the last 72 hours.  Invalid input(s): FREET3 Anemia work up No results for input(s): VITAMINB12, FOLATE, FERRITIN, TIBC, IRON, RETICCTPCT in the last 72 hours. Urinalysis    Component Value Date/Time   COLORURINE YELLOW 05/03/2017 1554   APPEARANCEUR CLEAR 05/03/2017 1554   LABSPEC 1.014 05/03/2017 1554   PHURINE 5.0 05/03/2017 1554   GLUCOSEU NEGATIVE 05/03/2017 1554   HGBUR NEGATIVE 05/03/2017 1554   BILIRUBINUR NEGATIVE 05/03/2017 1554   KETONESUR NEGATIVE 05/03/2017 1554   PROTEINUR NEGATIVE 05/03/2017 1554   NITRITE NEGATIVE 05/03/2017 1554   LEUKOCYTESUR NEGATIVE 05/03/2017 1554   Sepsis Labs Invalid input(s): PROCALCITONIN,  WBC,  LACTICIDVEN Microbiology Recent Results (from the past 240 hour(s))  SARS CORONAVIRUS 2 (TAT 6-24 HRS) Nasopharyngeal Nasopharyngeal Swab     Status: None   Collection Time: 08/18/20 11:39 AM   Specimen: Nasopharyngeal Swab  Result Value Ref Range Status   SARS Coronavirus 2 NEGATIVE NEGATIVE Final    Comment:  (NOTE) SARS-CoV-2 target nucleic acids are NOT DETECTED.  The SARS-CoV-2 RNA is generally detectable in upper and lower respiratory specimens during the acute phase of infection. Negative results do not preclude SARS-CoV-2 infection, do not rule out co-infections with other pathogens, and should not be used as the sole basis for treatment or other patient management decisions. Negative results must be combined  with clinical observations, patient history, and epidemiological information. The expected result is Negative.  Fact Sheet for Patients: HairSlick.no  Fact Sheet for Healthcare Providers: quierodirigir.com  This test is not yet approved or cleared by the [place] FDA and  has been authorized for detection and/or diagnosis of SARS-CoV-2 by FDA under an Emergency Use Authorization (EUA). This EUA will remain  in effect (meaning this test can be used) for the duration of the COVID-19 declaration under Se ction 564(b)(1) of the Act, 21 U.S.C. section 360bbb-3(b)(1), unless the authorization is terminated or revoked sooner.  Performed at [facility], [address], [place], [place] [address]      Time coordinating discharge: 36 minutes.  SIGNED:   Kathlen Mody, MD  [facility] 08/20/2020, 10:58 AM

## 2020-08-20 NOTE — TOC Transition Note (Signed)
Transition of Care [facility]) - CM/SW Discharge Note   Patient Details  Name: Mariah White MRN: [id number] Date of Birth: 07/02/1950  [facility]) CM/SW Contact:  Terrial Rhodes, LCSWA Phone Number: 08/20/2020, 11:27 AM   Clinical Narrative:     Patient will DC to: [facility]  Anticipated DC date: 08/20/2020  Family notified: Mariah White   Transport by: Sharin Mons  ?  Per MD patient ready for DC to [facility] . RN, patient, patient's family, and facility notified of DC. CSW still has not heard back from APS, but no barriers to discharge. Discharge Summary sent to facility.  RN given number for report tele# [phone] RM# [id number].DC packet on chart. Ambulance transport requested for patient.  CSW signing off.    Final next level of care: Skilled Nursing Facility Barriers to Discharge: No Barriers Identified   Patient Goals and CMS Choice   CMS Medicare.gov Compare Post Acute Care list provided to:: Patient Represenative (must comment) (patients son Mariah White) Choice offered to / list presented to : Adult Children Mariah White)  Discharge Placement              Patient [facility] bed at: [facility] Patient to be transferred to facility by: [facility] Name of family member notified: Mariah White Patient and family notified of of transfer: 08/20/20  Discharge Plan and Services In-house Referral: Clinical Social Work                                   Social Determinants of Health (SDOH) Interventions     Readmission Risk Interventions No flowsheet data found.

## 2020-08-20 NOTE — TOC Progression Note (Signed)
Transition of Care [facility]) - Progression Note    Patient Details  Name: Mariah White MRN: [id number] Date of Birth: 04/11/1950  [facility]) CM/SW Contact  Terrial Rhodes, LCSWA Phone Number: 08/20/2020, 10:53 AM  Clinical Narrative:     CSW received call from patients insurance. Patients insurance has been approved. [facility] Health ID# [id number] Auth ID# [id number]. Insurance authorization has been approved for five days. Start date is 5/20. Next review date is 5/24. Patient has SN[facility] bed at [facility]. CSW will continue to follow and assist with discharge planning needs.    Expected Discharge Plan: Skilled Nursing Facility Barriers to Discharge: Continued Medical Work up  Expected Discharge Plan and Services Expected Discharge Plan: Skilled Nursing Facility In-house Referral: Clinical Social Work     Living arrangements for the past 2 months: Single Family Home                                       Social Determinants of Health (SDOH) Interventions    Readmission Risk Interventions No flowsheet data found.

## 2020-08-20 NOTE — Care Management Important Message (Signed)
Important Message  Patient Details  Name: Mariah White MRN: [id number] Date of Birth: 06-Aug-1950   Medicare Important Message Given:  Yes     Renie Ora 08/20/2020, 8:35 AM

## 2020-08-24 ENCOUNTER — Ambulatory Visit: Payer: Medicaid Other | Admitting: Cardiology

## 2020-08-24 ENCOUNTER — Encounter: Payer: Self-pay | Admitting: Cardiology

## 2020-08-24 DIAGNOSIS — E871 Hypo-osmolality and hyponatremia: Secondary | ICD-10-CM | POA: Diagnosis not present

## 2020-08-24 DIAGNOSIS — N390 Urinary tract infection, site not specified: Secondary | ICD-10-CM | POA: Diagnosis not present

## 2020-08-24 DIAGNOSIS — E119 Type 2 diabetes mellitus without complications: Secondary | ICD-10-CM | POA: Diagnosis not present

## 2020-08-24 DIAGNOSIS — I2510 Atherosclerotic heart disease of native coronary artery without angina pectoris: Secondary | ICD-10-CM | POA: Diagnosis not present

## 2020-08-24 DIAGNOSIS — I5033 Acute on chronic diastolic (congestive) heart failure: Secondary | ICD-10-CM | POA: Diagnosis not present

## 2020-08-24 DIAGNOSIS — J9610 Chronic respiratory failure, unspecified whether with hypoxia or hypercapnia: Secondary | ICD-10-CM | POA: Diagnosis not present

## 2020-08-24 DIAGNOSIS — N189 Chronic kidney disease, unspecified: Secondary | ICD-10-CM | POA: Diagnosis not present

## 2020-08-24 DIAGNOSIS — I214 Non-ST elevation (NSTEMI) myocardial infarction: Secondary | ICD-10-CM | POA: Diagnosis not present

## 2020-08-24 NOTE — Progress Notes (Incomplete)
Clinical Summary Mariah White is a 71 y.o.female seen today for follow up of the following medical problems.  1. SOB/ChronicdiasotlicHF - started 1 year ago. Can occur at rest or with activity. Can have some coughing or wheezing. Can have some chest tightness at times. Midchest, 4/10 in severity. Tightness can occur at rest or with activity. Can be positional. Lasts a few minutes - has had some recent LE edema. Started about 4 months. Started on lasix a few months ago by  - 08/2016 echo: LVEF >75%, abnormal diastolic function per report. Normal LA, normal RV. Regarding diastolic function E/A 0.9, lateral e' 9cm, E/e' 8, decel time not reported, no normal LA.   - admit 08/2020, found to have LVEF 30-35%, multiple WMAs at [facility] - transferred to [facility]. Cath showed no significant coronary disease - repeat echo few days later showed LVEF had normalized.    2. COPD - followed by pcp    3. History of CVA - she has been on ASA and plavixby another provider. Not for cardiac reason  4. HTN -compliant with meds   5.CKD III - followed by Dr Wolfgang Phoenix - has proteinuria but not on RAS blockers due to propensity of AKI and hyperkalemia       Past Medical History:  Diagnosis Date  . Anxiety   . Arthritis   . CHF (congestive heart failure) (HCC)   . Chronic back pain   . Chronic pain   . COPD (chronic obstructive pulmonary disease) (HCC)   . Coronary artery disease   . Depression   . GERD (gastroesophageal reflux disease)   . Hypertension   . Migraine   . Stroke [facility])      Allergies  Allergen Reactions  . Tape Other (See Comments)    SKIN IS THIN- WILL TEAR AND BRUISE EASILY!! PAPER TAPE IS TOLERATED.  Marland Kitchen Penicillins Itching    Has patient had a PCN reaction causing immediate rash, facial/tongue/throat swelling, SOB or lightheadedness with hypotension: Yes Has patient had a PCN reaction causing severe rash involving mucus membranes or skin necrosis:  No Has patient had a PCN reaction that required hospitalization: No Has patient had a PCN reaction occurring within the last 10 years: No If all of the above answers are "NO", then may proceed with Cephalosporin use.      Current Outpatient Medications  Medication Sig Dispense Refill  . albuterol (PROVENTIL HFA;VENTOLIN HFA) 108 (90 BASE) MCG/ACT inhaler Inhale 2 puffs into the lungs See admin instructions. Inhale 2 puffs into the lungs two to three times a day    . ALPRAZolam (XANAX) 0.25 MG tablet Take 1 tablet (0.25 mg total) by mouth 3 (three) times daily as needed for anxiety or sleep. 3 tablet 0  . ARIPiprazole (ABILIFY) 5 MG tablet Take 1 tablet (5 mg total) by mouth at bedtime. 90 tablet 2  . aspirin EC 81 MG tablet Take 81 mg by mouth in the morning.    . clopidogrel (PLAVIX) 75 MG tablet Take 75 mg by mouth in the morning.    . diclofenac sodium (VOLTAREN) 1 % GEL Apply 2 g topically every 4 (four) hours as needed (for pain).    Marland Kitchen diphenhydrAMINE (BENADRYL) 25 mg capsule Take 25 mg by mouth daily as needed for allergies.    . DULoxetine (CYMBALTA) 60 MG capsule Take 1 capsule (60 mg total) by mouth daily. (Patient taking differently: Take 60 mg by mouth in the morning.) 30 capsule 3  .  ipratropium-albuterol (DUONEB) 0.5-2.5 (3) MG/3ML SOLN Inhale 3 mLs into the lungs every 6 (six) hours as needed (for shortness of breath or wheezing).    Marland Kitchen JANUVIA 25 MG tablet Take 25 mg by mouth in the morning.    . magnesium oxide (MAG-OX) 400 (241.3 Mg) MG tablet TAKE 1 TABLET BY MOUTH TWICE DAILY (Patient taking differently: Take 400 mg by mouth in the morning and at bedtime.) 180 tablet 2  . metoprolol tartrate (LOPRESSOR) 25 MG tablet Take 0.5 tablets (12.5 mg total) by mouth 2 (two) times daily. 60 tablet 1  . nicotine (NICODERM CQ - DOSED IN MG/24 HOURS) 14 mg/24hr patch Place 1 patch (14 mg total) onto the skin daily. 28 patch 0  . OXYGEN Inhale 2 L/min into the lungs as needed (for  shortness of breath).    . rosuvastatin (CRESTOR) 40 MG tablet Take 1 tablet (40 mg total) by mouth daily. 30 tablet 1  . SPIRIVA HANDIHALER 18 MCG inhalation capsule Place 1 capsule into inhaler and inhale daily.    . Vitamin D, Ergocalciferol, (DRISDOL) 1.25 MG (50000 UNIT) CAPS capsule Take 50,000 Units by mouth every 7 (seven) days.     No current facility-administered medications for this visit.     Past Surgical History:  Procedure Laterality Date  . ABDOMINAL HYSTERECTOMY    . breast lump removed    . BREAST SURGERY    . CHOLECYSTECTOMY    . FOOT SURGERY    . LEFT HEART CATH AND CORONARY ANGIOGRAPHY N/A 08/16/2020   Procedure: LEFT HEART CATH AND CORONARY ANGIOGRAPHY;  Surgeon: Yvonne Kendall, MD;  Location: [facility] INVAS[facility];  Service: Cardiovascular;  Laterality: N/A;     Allergies  Allergen Reactions  . Tape Other (See Comments)    SKIN IS THIN- WILL TEAR AND BRUISE EASILY!! PAPER TAPE IS TOLERATED.  Marland Kitchen Penicillins Itching    Has patient had a PCN reaction causing immediate rash, facial/tongue/throat swelling, SOB or lightheadedness with hypotension: Yes Has patient had a PCN reaction causing severe rash involving mucus membranes or skin necrosis: No Has patient had a PCN reaction that required hospitalization: No Has patient had a PCN reaction occurring within the last 10 years: No If all of the above answers are "NO", then may proceed with Cephalosporin use.       Family History  Problem Relation Age of Onset  . Bipolar disorder Daughter   . Diabetes Mother   . Stroke Father   . Diabetes Sister   . Diabetes Brother   . Diabetes Brother   . Colon cancer Sister   . Colon cancer Sister   . Pancreatic cancer Brother   . Heart attack Brother      Social History Mariah White reports that she has been smoking cigarettes. She started smoking about 61 years ago. She has been smoking about 1.00 pack per day. She has never used smokeless tobacco. Mariah White  reports no history of alcohol use.   Review of Systems CONSTITUTIONAL: No weight loss, fever, chills, weakness or fatigue.  HEENT: Eyes: No visual loss, blurred vision, double vision or yellow sclerae.No hearing loss, sneezing, congestion, runny nose or sore throat.  SKIN: No rash or itching.  CARDIOVASCULAR:  RESPIRATORY: No shortness of breath, cough or sputum.  GASTROINTESTINAL: No anorexia, nausea, vomiting or diarrhea. No abdominal pain or blood.  GENITOURINARY: No burning on urination, no polyuria NEUROLOGICAL: No headache, dizziness, syncope, paralysis, ataxia, numbness or tingling in the extremities. No change in  bowel or bladder control.  MUSCULOSKELETAL: No muscle, back pain, joint pain or stiffness.  LYMPHATICS: No enlarged nodes. No history of splenectomy.  PSYCHIATRIC: No history of depression or anxiety.  ENDOCRINOLOGIC: No reports of sweating, cold or heat intolerance. No polyuria or polydipsia.  Marland Kitchen   Physical Examination There were no vitals filed for this visit. There were no vitals filed for this visit.  Gen: resting comfortably, no acute distress HEENT: no scleral icterus, pupils equal round and reactive, no palptable cervical adenopathy,  CV Resp: Clear to auscultation bilaterally GI: abdomen is soft, non-tender, non-distended, normal bowel sounds, no hepatosplenomegaly MSK: extremities are warm, no edema.  Skin: warm, no rash Neuro:  no focal deficits Psych: appropriate affect   Diagnostic Studies  08/12/20 echo [facility] Summary  1. Technically difficult study.  2. The left ventricle is mildly dilated in size with mildly to moderately  increased wall thickness.  3. The left ventricular systolic function is mildly to moderately decreased,  LVEF is visually estimated at 30-35%.  4. The left atrium is mildly to moderately dilated in size.  5. The right ventricle is normal in size, with normal systolic function.     08/16/20 echo [facility] IMPRESSIONS    1. Left ventricular ejection fraction, by estimation, is 65 to 70%. The  left ventricle has normal function. Left ventricular diastolic parameters  are consistent with Grade I diastolic dysfunction (impaired relaxation).  2. Right ventricular systolic function is normal. The right ventricular  size is normal.  3. Left atrial size was mildly dilated.  4. Trivial mitral valve regurgitation.  5. The aortic valve is normal in structure. Aortic valve regurgitation is  not visualized.  6. The inferior vena cava is normal in size with greater than 50%  respiratory variability, suggesting right atrial pressure of 3 mmHg.    08/2020 cath 1. Mild to moderate, non-obstructive coronary artery disease. 2. Normal left ventricular filling pressure     Assessment and Plan  - Careful diuretic dosing due to renal dysfnction - mild LE edema today but no pulmonary symptmos, continue current diuretic   2. HTN -at goal, continue current meds   3. Hyperlipidemia - request pcp labs, continue statin   EKG today shows NSR, on acute ischemic changes      Antoine Poche, M.D., F.A.C.C.

## 2020-08-30 DIAGNOSIS — I509 Heart failure, unspecified: Secondary | ICD-10-CM | POA: Diagnosis not present

## 2020-08-30 DIAGNOSIS — E7800 Pure hypercholesterolemia, unspecified: Secondary | ICD-10-CM | POA: Diagnosis not present

## 2020-08-30 DIAGNOSIS — J449 Chronic obstructive pulmonary disease, unspecified: Secondary | ICD-10-CM | POA: Diagnosis not present

## 2020-08-30 DIAGNOSIS — I2510 Atherosclerotic heart disease of native coronary artery without angina pectoris: Secondary | ICD-10-CM | POA: Diagnosis not present

## 2020-09-01 ENCOUNTER — Ambulatory Visit: Payer: Medicaid Other | Admitting: Internal Medicine

## 2020-09-02 DIAGNOSIS — R410 Disorientation, unspecified: Secondary | ICD-10-CM | POA: Diagnosis not present

## 2020-09-02 DIAGNOSIS — S81819A Laceration without foreign body, unspecified lower leg, initial encounter: Secondary | ICD-10-CM | POA: Diagnosis not present

## 2020-09-02 DIAGNOSIS — W19XXXD Unspecified fall, subsequent encounter: Secondary | ICD-10-CM | POA: Diagnosis not present

## 2020-09-05 DIAGNOSIS — N179 Acute kidney failure, unspecified: Secondary | ICD-10-CM | POA: Diagnosis not present

## 2020-09-05 DIAGNOSIS — E875 Hyperkalemia: Secondary | ICD-10-CM | POA: Diagnosis not present

## 2020-09-05 DIAGNOSIS — I5032 Chronic diastolic (congestive) heart failure: Secondary | ICD-10-CM | POA: Diagnosis not present

## 2020-09-05 DIAGNOSIS — Z66 Do not resuscitate: Secondary | ICD-10-CM | POA: Diagnosis not present

## 2020-09-05 DIAGNOSIS — M6281 Muscle weakness (generalized): Secondary | ICD-10-CM | POA: Diagnosis not present

## 2020-09-05 DIAGNOSIS — E43 Unspecified severe protein-calorie malnutrition: Secondary | ICD-10-CM | POA: Diagnosis not present

## 2020-09-05 DIAGNOSIS — Z7901 Long term (current) use of anticoagulants: Secondary | ICD-10-CM | POA: Diagnosis not present

## 2020-09-05 DIAGNOSIS — R6889 Other general symptoms and signs: Secondary | ICD-10-CM | POA: Diagnosis not present

## 2020-09-05 DIAGNOSIS — N3000 Acute cystitis without hematuria: Secondary | ICD-10-CM | POA: Diagnosis not present

## 2020-09-05 DIAGNOSIS — E1122 Type 2 diabetes mellitus with diabetic chronic kidney disease: Secondary | ICD-10-CM | POA: Diagnosis not present

## 2020-09-05 DIAGNOSIS — Z7982 Long term (current) use of aspirin: Secondary | ICD-10-CM | POA: Diagnosis not present

## 2020-09-05 DIAGNOSIS — F17210 Nicotine dependence, cigarettes, uncomplicated: Secondary | ICD-10-CM | POA: Diagnosis not present

## 2020-09-05 DIAGNOSIS — E871 Hypo-osmolality and hyponatremia: Secondary | ICD-10-CM | POA: Diagnosis not present

## 2020-09-05 DIAGNOSIS — Z794 Long term (current) use of insulin: Secondary | ICD-10-CM | POA: Diagnosis not present

## 2020-09-05 DIAGNOSIS — A419 Sepsis, unspecified organism: Secondary | ICD-10-CM | POA: Diagnosis not present

## 2020-09-05 DIAGNOSIS — Z743 Need for continuous supervision: Secondary | ICD-10-CM | POA: Diagnosis not present

## 2020-09-05 DIAGNOSIS — E089 Diabetes mellitus due to underlying condition without complications: Secondary | ICD-10-CM | POA: Diagnosis not present

## 2020-09-05 DIAGNOSIS — I130 Hypertensive heart and chronic kidney disease with heart failure and stage 1 through stage 4 chronic kidney disease, or unspecified chronic kidney disease: Secondary | ICD-10-CM | POA: Diagnosis not present

## 2020-09-05 DIAGNOSIS — I10 Essential (primary) hypertension: Secondary | ICD-10-CM | POA: Diagnosis not present

## 2020-09-05 DIAGNOSIS — G9341 Metabolic encephalopathy: Secondary | ICD-10-CM | POA: Diagnosis not present

## 2020-09-05 DIAGNOSIS — R059 Cough, unspecified: Secondary | ICD-10-CM | POA: Diagnosis not present

## 2020-09-05 DIAGNOSIS — N390 Urinary tract infection, site not specified: Secondary | ICD-10-CM | POA: Diagnosis not present

## 2020-09-05 DIAGNOSIS — I129 Hypertensive chronic kidney disease with stage 1 through stage 4 chronic kidney disease, or unspecified chronic kidney disease: Secondary | ICD-10-CM | POA: Diagnosis not present

## 2020-09-05 DIAGNOSIS — Z1629 Resistance to other single specified antibiotic: Secondary | ICD-10-CM | POA: Diagnosis not present

## 2020-09-05 DIAGNOSIS — I2510 Atherosclerotic heart disease of native coronary artery without angina pectoris: Secondary | ICD-10-CM | POA: Diagnosis not present

## 2020-09-05 DIAGNOSIS — R279 Unspecified lack of coordination: Secondary | ICD-10-CM | POA: Diagnosis not present

## 2020-09-05 DIAGNOSIS — Z7984 Long term (current) use of oral hypoglycemic drugs: Secondary | ICD-10-CM | POA: Diagnosis not present

## 2020-09-05 DIAGNOSIS — E872 Acidosis: Secondary | ICD-10-CM | POA: Diagnosis not present

## 2020-09-05 DIAGNOSIS — N3001 Acute cystitis with hematuria: Secondary | ICD-10-CM | POA: Diagnosis not present

## 2020-09-05 DIAGNOSIS — I499 Cardiac arrhythmia, unspecified: Secondary | ICD-10-CM | POA: Diagnosis not present

## 2020-09-05 DIAGNOSIS — G894 Chronic pain syndrome: Secondary | ICD-10-CM | POA: Diagnosis not present

## 2020-09-05 DIAGNOSIS — N183 Chronic kidney disease, stage 3 unspecified: Secondary | ICD-10-CM | POA: Diagnosis not present

## 2020-09-05 DIAGNOSIS — R5381 Other malaise: Secondary | ICD-10-CM | POA: Diagnosis not present

## 2020-09-05 DIAGNOSIS — E119 Type 2 diabetes mellitus without complications: Secondary | ICD-10-CM | POA: Diagnosis not present

## 2020-09-05 DIAGNOSIS — N189 Chronic kidney disease, unspecified: Secondary | ICD-10-CM | POA: Diagnosis not present

## 2020-09-05 DIAGNOSIS — Z20822 Contact with and (suspected) exposure to covid-19: Secondary | ICD-10-CM | POA: Diagnosis not present

## 2020-09-05 DIAGNOSIS — Z7401 Bed confinement status: Secondary | ICD-10-CM | POA: Diagnosis not present

## 2020-09-05 DIAGNOSIS — J449 Chronic obstructive pulmonary disease, unspecified: Secondary | ICD-10-CM | POA: Diagnosis not present

## 2020-09-05 DIAGNOSIS — R410 Disorientation, unspecified: Secondary | ICD-10-CM | POA: Diagnosis not present

## 2020-09-05 DIAGNOSIS — R509 Fever, unspecified: Secondary | ICD-10-CM | POA: Diagnosis not present

## 2020-09-05 DIAGNOSIS — E876 Hypokalemia: Secondary | ICD-10-CM | POA: Diagnosis not present

## 2020-09-05 DIAGNOSIS — E860 Dehydration: Secondary | ICD-10-CM | POA: Diagnosis not present

## 2020-09-05 DIAGNOSIS — Z79899 Other long term (current) drug therapy: Secondary | ICD-10-CM | POA: Diagnosis not present

## 2020-09-05 DIAGNOSIS — R0689 Other abnormalities of breathing: Secondary | ICD-10-CM | POA: Diagnosis not present

## 2020-09-05 DIAGNOSIS — Z7902 Long term (current) use of antithrombotics/antiplatelets: Secondary | ICD-10-CM | POA: Diagnosis not present

## 2020-09-11 DIAGNOSIS — I129 Hypertensive chronic kidney disease with stage 1 through stage 4 chronic kidney disease, or unspecified chronic kidney disease: Secondary | ICD-10-CM | POA: Diagnosis not present

## 2020-09-11 DIAGNOSIS — E860 Dehydration: Secondary | ICD-10-CM | POA: Diagnosis not present

## 2020-09-11 DIAGNOSIS — I2510 Atherosclerotic heart disease of native coronary artery without angina pectoris: Secondary | ICD-10-CM | POA: Diagnosis not present

## 2020-09-11 DIAGNOSIS — Z7902 Long term (current) use of antithrombotics/antiplatelets: Secondary | ICD-10-CM | POA: Diagnosis not present

## 2020-09-11 DIAGNOSIS — N390 Urinary tract infection, site not specified: Secondary | ICD-10-CM | POA: Diagnosis not present

## 2020-09-11 DIAGNOSIS — G9341 Metabolic encephalopathy: Secondary | ICD-10-CM | POA: Diagnosis not present

## 2020-09-11 DIAGNOSIS — E871 Hypo-osmolality and hyponatremia: Secondary | ICD-10-CM | POA: Diagnosis not present

## 2020-09-11 DIAGNOSIS — E1122 Type 2 diabetes mellitus with diabetic chronic kidney disease: Secondary | ICD-10-CM | POA: Diagnosis not present

## 2020-09-11 DIAGNOSIS — J449 Chronic obstructive pulmonary disease, unspecified: Secondary | ICD-10-CM | POA: Diagnosis not present

## 2020-09-11 DIAGNOSIS — N179 Acute kidney failure, unspecified: Secondary | ICD-10-CM | POA: Diagnosis not present

## 2020-09-11 DIAGNOSIS — Z7982 Long term (current) use of aspirin: Secondary | ICD-10-CM | POA: Diagnosis not present

## 2020-09-11 DIAGNOSIS — N183 Chronic kidney disease, stage 3 unspecified: Secondary | ICD-10-CM | POA: Diagnosis not present

## 2020-09-12 DIAGNOSIS — G9341 Metabolic encephalopathy: Secondary | ICD-10-CM | POA: Diagnosis not present

## 2020-09-12 DIAGNOSIS — J449 Chronic obstructive pulmonary disease, unspecified: Secondary | ICD-10-CM | POA: Diagnosis not present

## 2020-09-12 DIAGNOSIS — Z7902 Long term (current) use of antithrombotics/antiplatelets: Secondary | ICD-10-CM | POA: Diagnosis not present

## 2020-09-12 DIAGNOSIS — Z7982 Long term (current) use of aspirin: Secondary | ICD-10-CM | POA: Diagnosis not present

## 2020-09-12 DIAGNOSIS — Z1629 Resistance to other single specified antibiotic: Secondary | ICD-10-CM | POA: Diagnosis not present

## 2020-09-12 DIAGNOSIS — N179 Acute kidney failure, unspecified: Secondary | ICD-10-CM | POA: Diagnosis not present

## 2020-09-12 DIAGNOSIS — E860 Dehydration: Secondary | ICD-10-CM | POA: Diagnosis not present

## 2020-09-12 DIAGNOSIS — I129 Hypertensive chronic kidney disease with stage 1 through stage 4 chronic kidney disease, or unspecified chronic kidney disease: Secondary | ICD-10-CM | POA: Diagnosis not present

## 2020-09-12 DIAGNOSIS — I2510 Atherosclerotic heart disease of native coronary artery without angina pectoris: Secondary | ICD-10-CM | POA: Diagnosis not present

## 2020-09-12 DIAGNOSIS — N390 Urinary tract infection, site not specified: Secondary | ICD-10-CM | POA: Diagnosis not present

## 2020-09-12 DIAGNOSIS — N183 Chronic kidney disease, stage 3 unspecified: Secondary | ICD-10-CM | POA: Diagnosis not present

## 2020-09-12 DIAGNOSIS — E1122 Type 2 diabetes mellitus with diabetic chronic kidney disease: Secondary | ICD-10-CM | POA: Diagnosis not present

## 2020-09-18 DIAGNOSIS — E875 Hyperkalemia: Secondary | ICD-10-CM | POA: Diagnosis not present

## 2020-09-18 DIAGNOSIS — E871 Hypo-osmolality and hyponatremia: Secondary | ICD-10-CM | POA: Diagnosis not present

## 2020-09-18 DIAGNOSIS — G9341 Metabolic encephalopathy: Secondary | ICD-10-CM | POA: Diagnosis not present

## 2020-09-18 DIAGNOSIS — N179 Acute kidney failure, unspecified: Secondary | ICD-10-CM | POA: Diagnosis not present

## 2020-09-18 DIAGNOSIS — A419 Sepsis, unspecified organism: Secondary | ICD-10-CM | POA: Diagnosis not present

## 2020-09-18 DIAGNOSIS — N189 Chronic kidney disease, unspecified: Secondary | ICD-10-CM | POA: Diagnosis not present

## 2020-09-19 DIAGNOSIS — N179 Acute kidney failure, unspecified: Secondary | ICD-10-CM | POA: Diagnosis not present

## 2020-09-19 DIAGNOSIS — N189 Chronic kidney disease, unspecified: Secondary | ICD-10-CM | POA: Diagnosis not present

## 2020-09-19 DIAGNOSIS — G9341 Metabolic encephalopathy: Secondary | ICD-10-CM | POA: Diagnosis not present

## 2020-09-19 DIAGNOSIS — A419 Sepsis, unspecified organism: Secondary | ICD-10-CM | POA: Diagnosis not present

## 2020-09-19 DIAGNOSIS — E871 Hypo-osmolality and hyponatremia: Secondary | ICD-10-CM | POA: Diagnosis not present

## 2020-09-20 DIAGNOSIS — J449 Chronic obstructive pulmonary disease, unspecified: Secondary | ICD-10-CM | POA: Diagnosis not present

## 2020-09-23 DIAGNOSIS — A419 Sepsis, unspecified organism: Secondary | ICD-10-CM | POA: Diagnosis not present

## 2020-09-24 DIAGNOSIS — I129 Hypertensive chronic kidney disease with stage 1 through stage 4 chronic kidney disease, or unspecified chronic kidney disease: Secondary | ICD-10-CM | POA: Diagnosis not present

## 2020-09-24 DIAGNOSIS — E1122 Type 2 diabetes mellitus with diabetic chronic kidney disease: Secondary | ICD-10-CM | POA: Diagnosis not present

## 2020-09-24 DIAGNOSIS — N179 Acute kidney failure, unspecified: Secondary | ICD-10-CM | POA: Diagnosis not present

## 2020-09-24 DIAGNOSIS — Z794 Long term (current) use of insulin: Secondary | ICD-10-CM | POA: Diagnosis not present

## 2020-09-24 DIAGNOSIS — I2510 Atherosclerotic heart disease of native coronary artery without angina pectoris: Secondary | ICD-10-CM | POA: Diagnosis not present

## 2020-09-24 DIAGNOSIS — Z7984 Long term (current) use of oral hypoglycemic drugs: Secondary | ICD-10-CM | POA: Diagnosis not present

## 2020-09-24 DIAGNOSIS — N183 Chronic kidney disease, stage 3 unspecified: Secondary | ICD-10-CM | POA: Diagnosis not present

## 2020-09-24 DIAGNOSIS — Z79899 Other long term (current) drug therapy: Secondary | ICD-10-CM | POA: Diagnosis not present

## 2020-09-24 DIAGNOSIS — G894 Chronic pain syndrome: Secondary | ICD-10-CM | POA: Diagnosis not present

## 2020-09-25 DIAGNOSIS — N183 Chronic kidney disease, stage 3 unspecified: Secondary | ICD-10-CM | POA: Diagnosis not present

## 2020-09-25 DIAGNOSIS — E119 Type 2 diabetes mellitus without complications: Secondary | ICD-10-CM | POA: Diagnosis not present

## 2020-09-25 DIAGNOSIS — G894 Chronic pain syndrome: Secondary | ICD-10-CM | POA: Diagnosis not present

## 2020-09-25 DIAGNOSIS — Z79899 Other long term (current) drug therapy: Secondary | ICD-10-CM | POA: Diagnosis not present

## 2020-09-25 DIAGNOSIS — N179 Acute kidney failure, unspecified: Secondary | ICD-10-CM | POA: Diagnosis not present

## 2020-09-25 DIAGNOSIS — Z7984 Long term (current) use of oral hypoglycemic drugs: Secondary | ICD-10-CM | POA: Diagnosis not present

## 2020-09-25 DIAGNOSIS — Z7982 Long term (current) use of aspirin: Secondary | ICD-10-CM | POA: Diagnosis not present

## 2020-09-25 DIAGNOSIS — I2510 Atherosclerotic heart disease of native coronary artery without angina pectoris: Secondary | ICD-10-CM | POA: Diagnosis not present

## 2020-09-25 DIAGNOSIS — I129 Hypertensive chronic kidney disease with stage 1 through stage 4 chronic kidney disease, or unspecified chronic kidney disease: Secondary | ICD-10-CM | POA: Diagnosis not present

## 2020-09-25 DIAGNOSIS — J449 Chronic obstructive pulmonary disease, unspecified: Secondary | ICD-10-CM | POA: Diagnosis not present

## 2020-09-26 DIAGNOSIS — I10 Essential (primary) hypertension: Secondary | ICD-10-CM | POA: Diagnosis not present

## 2020-09-26 DIAGNOSIS — E119 Type 2 diabetes mellitus without complications: Secondary | ICD-10-CM | POA: Diagnosis not present

## 2020-09-26 DIAGNOSIS — R410 Disorientation, unspecified: Secondary | ICD-10-CM | POA: Diagnosis not present

## 2020-09-26 DIAGNOSIS — J449 Chronic obstructive pulmonary disease, unspecified: Secondary | ICD-10-CM | POA: Diagnosis not present

## 2020-10-04 DIAGNOSIS — I10 Essential (primary) hypertension: Secondary | ICD-10-CM | POA: Diagnosis not present

## 2020-10-04 DIAGNOSIS — E871 Hypo-osmolality and hyponatremia: Secondary | ICD-10-CM | POA: Diagnosis not present

## 2020-10-04 DIAGNOSIS — Z720 Tobacco use: Secondary | ICD-10-CM | POA: Diagnosis not present

## 2020-10-04 DIAGNOSIS — N390 Urinary tract infection, site not specified: Secondary | ICD-10-CM | POA: Diagnosis not present

## 2020-10-04 DIAGNOSIS — E785 Hyperlipidemia, unspecified: Secondary | ICD-10-CM | POA: Diagnosis not present

## 2020-10-04 DIAGNOSIS — A419 Sepsis, unspecified organism: Secondary | ICD-10-CM | POA: Diagnosis not present

## 2020-10-04 DIAGNOSIS — I2510 Atherosclerotic heart disease of native coronary artery without angina pectoris: Secondary | ICD-10-CM | POA: Diagnosis not present

## 2020-10-05 DIAGNOSIS — R278 Other lack of coordination: Secondary | ICD-10-CM | POA: Diagnosis not present

## 2020-10-05 DIAGNOSIS — M6281 Muscle weakness (generalized): Secondary | ICD-10-CM | POA: Diagnosis not present

## 2020-10-05 DIAGNOSIS — R262 Difficulty in walking, not elsewhere classified: Secondary | ICD-10-CM | POA: Diagnosis not present

## 2020-10-05 DIAGNOSIS — R1312 Dysphagia, oropharyngeal phase: Secondary | ICD-10-CM | POA: Diagnosis not present

## 2020-10-06 DIAGNOSIS — R278 Other lack of coordination: Secondary | ICD-10-CM | POA: Diagnosis not present

## 2020-10-06 DIAGNOSIS — R262 Difficulty in walking, not elsewhere classified: Secondary | ICD-10-CM | POA: Diagnosis not present

## 2020-10-06 DIAGNOSIS — M6281 Muscle weakness (generalized): Secondary | ICD-10-CM | POA: Diagnosis not present

## 2020-10-06 DIAGNOSIS — R1312 Dysphagia, oropharyngeal phase: Secondary | ICD-10-CM | POA: Diagnosis not present

## 2020-10-07 DIAGNOSIS — R262 Difficulty in walking, not elsewhere classified: Secondary | ICD-10-CM | POA: Diagnosis not present

## 2020-10-07 DIAGNOSIS — M6281 Muscle weakness (generalized): Secondary | ICD-10-CM | POA: Diagnosis not present

## 2020-10-07 DIAGNOSIS — R278 Other lack of coordination: Secondary | ICD-10-CM | POA: Diagnosis not present

## 2020-10-07 DIAGNOSIS — R1312 Dysphagia, oropharyngeal phase: Secondary | ICD-10-CM | POA: Diagnosis not present

## 2020-10-08 DIAGNOSIS — R1312 Dysphagia, oropharyngeal phase: Secondary | ICD-10-CM | POA: Diagnosis not present

## 2020-10-08 DIAGNOSIS — M6281 Muscle weakness (generalized): Secondary | ICD-10-CM | POA: Diagnosis not present

## 2020-10-08 DIAGNOSIS — R262 Difficulty in walking, not elsewhere classified: Secondary | ICD-10-CM | POA: Diagnosis not present

## 2020-10-08 DIAGNOSIS — R278 Other lack of coordination: Secondary | ICD-10-CM | POA: Diagnosis not present

## 2020-10-11 DIAGNOSIS — R1312 Dysphagia, oropharyngeal phase: Secondary | ICD-10-CM | POA: Diagnosis not present

## 2020-10-11 DIAGNOSIS — M6281 Muscle weakness (generalized): Secondary | ICD-10-CM | POA: Diagnosis not present

## 2020-10-11 DIAGNOSIS — R278 Other lack of coordination: Secondary | ICD-10-CM | POA: Diagnosis not present

## 2020-10-11 DIAGNOSIS — R262 Difficulty in walking, not elsewhere classified: Secondary | ICD-10-CM | POA: Diagnosis not present

## 2020-10-12 DIAGNOSIS — R278 Other lack of coordination: Secondary | ICD-10-CM | POA: Diagnosis not present

## 2020-10-12 DIAGNOSIS — R262 Difficulty in walking, not elsewhere classified: Secondary | ICD-10-CM | POA: Diagnosis not present

## 2020-10-12 DIAGNOSIS — R1312 Dysphagia, oropharyngeal phase: Secondary | ICD-10-CM | POA: Diagnosis not present

## 2020-10-12 DIAGNOSIS — M6281 Muscle weakness (generalized): Secondary | ICD-10-CM | POA: Diagnosis not present

## 2020-10-13 DIAGNOSIS — H524 Presbyopia: Secondary | ICD-10-CM | POA: Diagnosis not present

## 2020-10-13 DIAGNOSIS — R1312 Dysphagia, oropharyngeal phase: Secondary | ICD-10-CM | POA: Diagnosis not present

## 2020-10-13 DIAGNOSIS — Z961 Presence of intraocular lens: Secondary | ICD-10-CM | POA: Diagnosis not present

## 2020-10-13 DIAGNOSIS — H5015 Alternating exotropia: Secondary | ICD-10-CM | POA: Diagnosis not present

## 2020-10-13 DIAGNOSIS — R262 Difficulty in walking, not elsewhere classified: Secondary | ICD-10-CM | POA: Diagnosis not present

## 2020-10-13 DIAGNOSIS — R278 Other lack of coordination: Secondary | ICD-10-CM | POA: Diagnosis not present

## 2020-10-13 DIAGNOSIS — M6281 Muscle weakness (generalized): Secondary | ICD-10-CM | POA: Diagnosis not present

## 2020-10-13 DIAGNOSIS — E119 Type 2 diabetes mellitus without complications: Secondary | ICD-10-CM | POA: Diagnosis not present

## 2020-10-14 DIAGNOSIS — R1312 Dysphagia, oropharyngeal phase: Secondary | ICD-10-CM | POA: Diagnosis not present

## 2020-10-14 DIAGNOSIS — R278 Other lack of coordination: Secondary | ICD-10-CM | POA: Diagnosis not present

## 2020-10-14 DIAGNOSIS — R262 Difficulty in walking, not elsewhere classified: Secondary | ICD-10-CM | POA: Diagnosis not present

## 2020-10-14 DIAGNOSIS — M6281 Muscle weakness (generalized): Secondary | ICD-10-CM | POA: Diagnosis not present

## 2020-10-15 DIAGNOSIS — R262 Difficulty in walking, not elsewhere classified: Secondary | ICD-10-CM | POA: Diagnosis not present

## 2020-10-15 DIAGNOSIS — R278 Other lack of coordination: Secondary | ICD-10-CM | POA: Diagnosis not present

## 2020-10-15 DIAGNOSIS — H5213 Myopia, bilateral: Secondary | ICD-10-CM | POA: Diagnosis not present

## 2020-10-15 DIAGNOSIS — M6281 Muscle weakness (generalized): Secondary | ICD-10-CM | POA: Diagnosis not present

## 2020-10-15 DIAGNOSIS — R1312 Dysphagia, oropharyngeal phase: Secondary | ICD-10-CM | POA: Diagnosis not present

## 2020-10-16 DIAGNOSIS — R262 Difficulty in walking, not elsewhere classified: Secondary | ICD-10-CM | POA: Diagnosis not present

## 2020-10-16 DIAGNOSIS — R1312 Dysphagia, oropharyngeal phase: Secondary | ICD-10-CM | POA: Diagnosis not present

## 2020-10-16 DIAGNOSIS — R278 Other lack of coordination: Secondary | ICD-10-CM | POA: Diagnosis not present

## 2020-10-16 DIAGNOSIS — M6281 Muscle weakness (generalized): Secondary | ICD-10-CM | POA: Diagnosis not present

## 2020-10-17 DIAGNOSIS — M6281 Muscle weakness (generalized): Secondary | ICD-10-CM | POA: Diagnosis not present

## 2020-10-17 DIAGNOSIS — R278 Other lack of coordination: Secondary | ICD-10-CM | POA: Diagnosis not present

## 2020-10-17 DIAGNOSIS — R262 Difficulty in walking, not elsewhere classified: Secondary | ICD-10-CM | POA: Diagnosis not present

## 2020-10-17 DIAGNOSIS — R1312 Dysphagia, oropharyngeal phase: Secondary | ICD-10-CM | POA: Diagnosis not present

## 2020-10-18 DIAGNOSIS — R278 Other lack of coordination: Secondary | ICD-10-CM | POA: Diagnosis not present

## 2020-10-18 DIAGNOSIS — R1312 Dysphagia, oropharyngeal phase: Secondary | ICD-10-CM | POA: Diagnosis not present

## 2020-10-18 DIAGNOSIS — R262 Difficulty in walking, not elsewhere classified: Secondary | ICD-10-CM | POA: Diagnosis not present

## 2020-10-18 DIAGNOSIS — M6281 Muscle weakness (generalized): Secondary | ICD-10-CM | POA: Diagnosis not present

## 2020-10-19 DIAGNOSIS — R1312 Dysphagia, oropharyngeal phase: Secondary | ICD-10-CM | POA: Diagnosis not present

## 2020-10-19 DIAGNOSIS — M6281 Muscle weakness (generalized): Secondary | ICD-10-CM | POA: Diagnosis not present

## 2020-10-19 DIAGNOSIS — J449 Chronic obstructive pulmonary disease, unspecified: Secondary | ICD-10-CM | POA: Diagnosis not present

## 2020-10-19 DIAGNOSIS — R262 Difficulty in walking, not elsewhere classified: Secondary | ICD-10-CM | POA: Diagnosis not present

## 2020-10-19 DIAGNOSIS — R278 Other lack of coordination: Secondary | ICD-10-CM | POA: Diagnosis not present

## 2020-10-20 DIAGNOSIS — M6281 Muscle weakness (generalized): Secondary | ICD-10-CM | POA: Diagnosis not present

## 2020-10-20 DIAGNOSIS — J449 Chronic obstructive pulmonary disease, unspecified: Secondary | ICD-10-CM | POA: Diagnosis not present

## 2020-10-20 DIAGNOSIS — R1312 Dysphagia, oropharyngeal phase: Secondary | ICD-10-CM | POA: Diagnosis not present

## 2020-10-20 DIAGNOSIS — R278 Other lack of coordination: Secondary | ICD-10-CM | POA: Diagnosis not present

## 2020-10-20 DIAGNOSIS — R059 Cough, unspecified: Secondary | ICD-10-CM | POA: Diagnosis not present

## 2020-10-20 DIAGNOSIS — R262 Difficulty in walking, not elsewhere classified: Secondary | ICD-10-CM | POA: Diagnosis not present

## 2020-10-21 DIAGNOSIS — R1312 Dysphagia, oropharyngeal phase: Secondary | ICD-10-CM | POA: Diagnosis not present

## 2020-10-21 DIAGNOSIS — M6281 Muscle weakness (generalized): Secondary | ICD-10-CM | POA: Diagnosis not present

## 2020-10-21 DIAGNOSIS — R262 Difficulty in walking, not elsewhere classified: Secondary | ICD-10-CM | POA: Diagnosis not present

## 2020-10-21 DIAGNOSIS — R278 Other lack of coordination: Secondary | ICD-10-CM | POA: Diagnosis not present

## 2020-10-22 DIAGNOSIS — R262 Difficulty in walking, not elsewhere classified: Secondary | ICD-10-CM | POA: Diagnosis not present

## 2020-10-22 DIAGNOSIS — R278 Other lack of coordination: Secondary | ICD-10-CM | POA: Diagnosis not present

## 2020-10-22 DIAGNOSIS — M6281 Muscle weakness (generalized): Secondary | ICD-10-CM | POA: Diagnosis not present

## 2020-10-22 DIAGNOSIS — J449 Chronic obstructive pulmonary disease, unspecified: Secondary | ICD-10-CM | POA: Diagnosis not present

## 2020-10-22 DIAGNOSIS — R1312 Dysphagia, oropharyngeal phase: Secondary | ICD-10-CM | POA: Diagnosis not present

## 2020-10-23 DIAGNOSIS — R1312 Dysphagia, oropharyngeal phase: Secondary | ICD-10-CM | POA: Diagnosis not present

## 2020-10-23 DIAGNOSIS — R278 Other lack of coordination: Secondary | ICD-10-CM | POA: Diagnosis not present

## 2020-10-23 DIAGNOSIS — R262 Difficulty in walking, not elsewhere classified: Secondary | ICD-10-CM | POA: Diagnosis not present

## 2020-10-23 DIAGNOSIS — M6281 Muscle weakness (generalized): Secondary | ICD-10-CM | POA: Diagnosis not present

## 2020-10-25 DIAGNOSIS — R262 Difficulty in walking, not elsewhere classified: Secondary | ICD-10-CM | POA: Diagnosis not present

## 2020-10-25 DIAGNOSIS — R278 Other lack of coordination: Secondary | ICD-10-CM | POA: Diagnosis not present

## 2020-10-25 DIAGNOSIS — M6281 Muscle weakness (generalized): Secondary | ICD-10-CM | POA: Diagnosis not present

## 2020-10-25 DIAGNOSIS — R1312 Dysphagia, oropharyngeal phase: Secondary | ICD-10-CM | POA: Diagnosis not present

## 2020-10-26 DIAGNOSIS — R262 Difficulty in walking, not elsewhere classified: Secondary | ICD-10-CM | POA: Diagnosis not present

## 2020-10-26 DIAGNOSIS — R1312 Dysphagia, oropharyngeal phase: Secondary | ICD-10-CM | POA: Diagnosis not present

## 2020-10-26 DIAGNOSIS — N390 Urinary tract infection, site not specified: Secondary | ICD-10-CM | POA: Diagnosis not present

## 2020-10-26 DIAGNOSIS — R278 Other lack of coordination: Secondary | ICD-10-CM | POA: Diagnosis not present

## 2020-10-26 DIAGNOSIS — M6281 Muscle weakness (generalized): Secondary | ICD-10-CM | POA: Diagnosis not present

## 2020-10-27 DIAGNOSIS — B351 Tinea unguium: Secondary | ICD-10-CM | POA: Diagnosis not present

## 2020-10-27 DIAGNOSIS — R278 Other lack of coordination: Secondary | ICD-10-CM | POA: Diagnosis not present

## 2020-10-27 DIAGNOSIS — R262 Difficulty in walking, not elsewhere classified: Secondary | ICD-10-CM | POA: Diagnosis not present

## 2020-10-27 DIAGNOSIS — M6281 Muscle weakness (generalized): Secondary | ICD-10-CM | POA: Diagnosis not present

## 2020-10-27 DIAGNOSIS — R1312 Dysphagia, oropharyngeal phase: Secondary | ICD-10-CM | POA: Diagnosis not present

## 2020-10-27 DIAGNOSIS — E1159 Type 2 diabetes mellitus with other circulatory complications: Secondary | ICD-10-CM | POA: Diagnosis not present

## 2020-10-28 DIAGNOSIS — R1312 Dysphagia, oropharyngeal phase: Secondary | ICD-10-CM | POA: Diagnosis not present

## 2020-10-28 DIAGNOSIS — M6281 Muscle weakness (generalized): Secondary | ICD-10-CM | POA: Diagnosis not present

## 2020-10-28 DIAGNOSIS — R278 Other lack of coordination: Secondary | ICD-10-CM | POA: Diagnosis not present

## 2020-10-28 DIAGNOSIS — R262 Difficulty in walking, not elsewhere classified: Secondary | ICD-10-CM | POA: Diagnosis not present

## 2020-10-29 DIAGNOSIS — R278 Other lack of coordination: Secondary | ICD-10-CM | POA: Diagnosis not present

## 2020-10-29 DIAGNOSIS — R1312 Dysphagia, oropharyngeal phase: Secondary | ICD-10-CM | POA: Diagnosis not present

## 2020-10-29 DIAGNOSIS — R262 Difficulty in walking, not elsewhere classified: Secondary | ICD-10-CM | POA: Diagnosis not present

## 2020-10-29 DIAGNOSIS — M6281 Muscle weakness (generalized): Secondary | ICD-10-CM | POA: Diagnosis not present

## 2020-11-01 DIAGNOSIS — M6281 Muscle weakness (generalized): Secondary | ICD-10-CM | POA: Diagnosis not present

## 2020-11-01 DIAGNOSIS — R262 Difficulty in walking, not elsewhere classified: Secondary | ICD-10-CM | POA: Diagnosis not present

## 2020-11-01 DIAGNOSIS — J9610 Chronic respiratory failure, unspecified whether with hypoxia or hypercapnia: Secondary | ICD-10-CM | POA: Diagnosis not present

## 2020-11-01 DIAGNOSIS — R058 Other specified cough: Secondary | ICD-10-CM | POA: Diagnosis not present

## 2020-11-01 DIAGNOSIS — R278 Other lack of coordination: Secondary | ICD-10-CM | POA: Diagnosis not present

## 2020-11-01 DIAGNOSIS — R1312 Dysphagia, oropharyngeal phase: Secondary | ICD-10-CM | POA: Diagnosis not present

## 2020-11-02 DIAGNOSIS — R262 Difficulty in walking, not elsewhere classified: Secondary | ICD-10-CM | POA: Diagnosis not present

## 2020-11-02 DIAGNOSIS — M1712 Unilateral primary osteoarthritis, left knee: Secondary | ICD-10-CM | POA: Diagnosis not present

## 2020-11-02 DIAGNOSIS — R278 Other lack of coordination: Secondary | ICD-10-CM | POA: Diagnosis not present

## 2020-11-02 DIAGNOSIS — R1312 Dysphagia, oropharyngeal phase: Secondary | ICD-10-CM | POA: Diagnosis not present

## 2020-11-02 DIAGNOSIS — M6281 Muscle weakness (generalized): Secondary | ICD-10-CM | POA: Diagnosis not present

## 2020-11-03 DIAGNOSIS — R278 Other lack of coordination: Secondary | ICD-10-CM | POA: Diagnosis not present

## 2020-11-03 DIAGNOSIS — M6281 Muscle weakness (generalized): Secondary | ICD-10-CM | POA: Diagnosis not present

## 2020-11-03 DIAGNOSIS — R1312 Dysphagia, oropharyngeal phase: Secondary | ICD-10-CM | POA: Diagnosis not present

## 2020-11-03 DIAGNOSIS — R262 Difficulty in walking, not elsewhere classified: Secondary | ICD-10-CM | POA: Diagnosis not present

## 2020-11-04 DIAGNOSIS — R262 Difficulty in walking, not elsewhere classified: Secondary | ICD-10-CM | POA: Diagnosis not present

## 2020-11-04 DIAGNOSIS — M6281 Muscle weakness (generalized): Secondary | ICD-10-CM | POA: Diagnosis not present

## 2020-11-04 DIAGNOSIS — R278 Other lack of coordination: Secondary | ICD-10-CM | POA: Diagnosis not present

## 2020-11-04 DIAGNOSIS — R1312 Dysphagia, oropharyngeal phase: Secondary | ICD-10-CM | POA: Diagnosis not present

## 2020-11-05 DIAGNOSIS — R278 Other lack of coordination: Secondary | ICD-10-CM | POA: Diagnosis not present

## 2020-11-05 DIAGNOSIS — R1312 Dysphagia, oropharyngeal phase: Secondary | ICD-10-CM | POA: Diagnosis not present

## 2020-11-05 DIAGNOSIS — M6281 Muscle weakness (generalized): Secondary | ICD-10-CM | POA: Diagnosis not present

## 2020-11-05 DIAGNOSIS — R262 Difficulty in walking, not elsewhere classified: Secondary | ICD-10-CM | POA: Diagnosis not present

## 2020-11-08 DIAGNOSIS — Z720 Tobacco use: Secondary | ICD-10-CM | POA: Diagnosis not present

## 2020-11-08 DIAGNOSIS — E785 Hyperlipidemia, unspecified: Secondary | ICD-10-CM | POA: Diagnosis not present

## 2020-11-08 DIAGNOSIS — R278 Other lack of coordination: Secondary | ICD-10-CM | POA: Diagnosis not present

## 2020-11-08 DIAGNOSIS — I2510 Atherosclerotic heart disease of native coronary artery without angina pectoris: Secondary | ICD-10-CM | POA: Diagnosis not present

## 2020-11-08 DIAGNOSIS — R1312 Dysphagia, oropharyngeal phase: Secondary | ICD-10-CM | POA: Diagnosis not present

## 2020-11-08 DIAGNOSIS — I10 Essential (primary) hypertension: Secondary | ICD-10-CM | POA: Diagnosis not present

## 2020-11-08 DIAGNOSIS — M6281 Muscle weakness (generalized): Secondary | ICD-10-CM | POA: Diagnosis not present

## 2020-11-08 DIAGNOSIS — R262 Difficulty in walking, not elsewhere classified: Secondary | ICD-10-CM | POA: Diagnosis not present

## 2020-11-08 DIAGNOSIS — J449 Chronic obstructive pulmonary disease, unspecified: Secondary | ICD-10-CM | POA: Diagnosis not present

## 2020-11-08 DIAGNOSIS — I5030 Unspecified diastolic (congestive) heart failure: Secondary | ICD-10-CM | POA: Diagnosis not present

## 2020-11-08 DIAGNOSIS — G8929 Other chronic pain: Secondary | ICD-10-CM | POA: Diagnosis not present

## 2020-11-10 DIAGNOSIS — R1312 Dysphagia, oropharyngeal phase: Secondary | ICD-10-CM | POA: Diagnosis not present

## 2020-11-10 DIAGNOSIS — M6281 Muscle weakness (generalized): Secondary | ICD-10-CM | POA: Diagnosis not present

## 2020-11-10 DIAGNOSIS — R278 Other lack of coordination: Secondary | ICD-10-CM | POA: Diagnosis not present

## 2020-11-10 DIAGNOSIS — R262 Difficulty in walking, not elsewhere classified: Secondary | ICD-10-CM | POA: Diagnosis not present

## 2020-11-12 DIAGNOSIS — R262 Difficulty in walking, not elsewhere classified: Secondary | ICD-10-CM | POA: Diagnosis not present

## 2020-11-12 DIAGNOSIS — M6281 Muscle weakness (generalized): Secondary | ICD-10-CM | POA: Diagnosis not present

## 2020-11-12 DIAGNOSIS — R1312 Dysphagia, oropharyngeal phase: Secondary | ICD-10-CM | POA: Diagnosis not present

## 2020-11-12 DIAGNOSIS — R278 Other lack of coordination: Secondary | ICD-10-CM | POA: Diagnosis not present

## 2020-11-20 DIAGNOSIS — J449 Chronic obstructive pulmonary disease, unspecified: Secondary | ICD-10-CM | POA: Diagnosis not present

## 2020-11-22 DIAGNOSIS — R197 Diarrhea, unspecified: Secondary | ICD-10-CM | POA: Diagnosis not present

## 2020-11-23 DIAGNOSIS — L600 Ingrowing nail: Secondary | ICD-10-CM | POA: Diagnosis not present

## 2020-11-23 DIAGNOSIS — H1033 Unspecified acute conjunctivitis, bilateral: Secondary | ICD-10-CM | POA: Diagnosis not present

## 2020-12-01 DIAGNOSIS — I509 Heart failure, unspecified: Secondary | ICD-10-CM | POA: Diagnosis not present

## 2020-12-01 DIAGNOSIS — J449 Chronic obstructive pulmonary disease, unspecified: Secondary | ICD-10-CM | POA: Diagnosis not present

## 2020-12-01 DIAGNOSIS — E7800 Pure hypercholesterolemia, unspecified: Secondary | ICD-10-CM | POA: Diagnosis not present

## 2020-12-01 DIAGNOSIS — I2510 Atherosclerotic heart disease of native coronary artery without angina pectoris: Secondary | ICD-10-CM | POA: Diagnosis not present

## 2020-12-03 DIAGNOSIS — E785 Hyperlipidemia, unspecified: Secondary | ICD-10-CM | POA: Diagnosis not present

## 2020-12-03 DIAGNOSIS — I5030 Unspecified diastolic (congestive) heart failure: Secondary | ICD-10-CM | POA: Diagnosis not present

## 2020-12-03 DIAGNOSIS — N189 Chronic kidney disease, unspecified: Secondary | ICD-10-CM | POA: Diagnosis not present

## 2020-12-03 DIAGNOSIS — I2510 Atherosclerotic heart disease of native coronary artery without angina pectoris: Secondary | ICD-10-CM | POA: Diagnosis not present

## 2020-12-03 DIAGNOSIS — I10 Essential (primary) hypertension: Secondary | ICD-10-CM | POA: Diagnosis not present

## 2020-12-03 DIAGNOSIS — E119 Type 2 diabetes mellitus without complications: Secondary | ICD-10-CM | POA: Diagnosis not present

## 2020-12-07 DIAGNOSIS — J9611 Chronic respiratory failure with hypoxia: Secondary | ICD-10-CM | POA: Diagnosis not present

## 2020-12-07 DIAGNOSIS — Z66 Do not resuscitate: Secondary | ICD-10-CM | POA: Diagnosis not present

## 2020-12-07 DIAGNOSIS — B9789 Other viral agents as the cause of diseases classified elsewhere: Secondary | ICD-10-CM | POA: Diagnosis not present

## 2020-12-07 DIAGNOSIS — Z7984 Long term (current) use of oral hypoglycemic drugs: Secondary | ICD-10-CM | POA: Diagnosis not present

## 2020-12-07 DIAGNOSIS — J449 Chronic obstructive pulmonary disease, unspecified: Secondary | ICD-10-CM | POA: Diagnosis not present

## 2020-12-07 DIAGNOSIS — E875 Hyperkalemia: Secondary | ICD-10-CM | POA: Diagnosis not present

## 2020-12-07 DIAGNOSIS — K219 Gastro-esophageal reflux disease without esophagitis: Secondary | ICD-10-CM | POA: Diagnosis not present

## 2020-12-07 DIAGNOSIS — N179 Acute kidney failure, unspecified: Secondary | ICD-10-CM | POA: Diagnosis not present

## 2020-12-07 DIAGNOSIS — R197 Diarrhea, unspecified: Secondary | ICD-10-CM | POA: Diagnosis not present

## 2020-12-07 DIAGNOSIS — E785 Hyperlipidemia, unspecified: Secondary | ICD-10-CM | POA: Diagnosis not present

## 2020-12-07 DIAGNOSIS — Z720 Tobacco use: Secondary | ICD-10-CM | POA: Diagnosis not present

## 2020-12-07 DIAGNOSIS — I5033 Acute on chronic diastolic (congestive) heart failure: Secondary | ICD-10-CM | POA: Diagnosis not present

## 2020-12-07 DIAGNOSIS — R0902 Hypoxemia: Secondary | ICD-10-CM | POA: Diagnosis not present

## 2020-12-07 DIAGNOSIS — R6889 Other general symptoms and signs: Secondary | ICD-10-CM | POA: Diagnosis not present

## 2020-12-07 DIAGNOSIS — I252 Old myocardial infarction: Secondary | ICD-10-CM | POA: Diagnosis not present

## 2020-12-07 DIAGNOSIS — I5020 Unspecified systolic (congestive) heart failure: Secondary | ICD-10-CM | POA: Diagnosis not present

## 2020-12-07 DIAGNOSIS — I6389 Other cerebral infarction: Secondary | ICD-10-CM | POA: Diagnosis not present

## 2020-12-07 DIAGNOSIS — A419 Sepsis, unspecified organism: Secondary | ICD-10-CM | POA: Diagnosis not present

## 2020-12-07 DIAGNOSIS — E119 Type 2 diabetes mellitus without complications: Secondary | ICD-10-CM | POA: Diagnosis not present

## 2020-12-07 DIAGNOSIS — I130 Hypertensive heart and chronic kidney disease with heart failure and stage 1 through stage 4 chronic kidney disease, or unspecified chronic kidney disease: Secondary | ICD-10-CM | POA: Diagnosis not present

## 2020-12-07 DIAGNOSIS — Z743 Need for continuous supervision: Secondary | ICD-10-CM | POA: Diagnosis not present

## 2020-12-07 DIAGNOSIS — G4489 Other headache syndrome: Secondary | ICD-10-CM | POA: Diagnosis not present

## 2020-12-07 DIAGNOSIS — G9341 Metabolic encephalopathy: Secondary | ICD-10-CM | POA: Diagnosis not present

## 2020-12-07 DIAGNOSIS — I700 Atherosclerosis of aorta: Secondary | ICD-10-CM | POA: Diagnosis not present

## 2020-12-07 DIAGNOSIS — I959 Hypotension, unspecified: Secondary | ICD-10-CM | POA: Diagnosis not present

## 2020-12-07 DIAGNOSIS — N390 Urinary tract infection, site not specified: Secondary | ICD-10-CM | POA: Diagnosis not present

## 2020-12-07 DIAGNOSIS — M6281 Muscle weakness (generalized): Secondary | ICD-10-CM | POA: Diagnosis not present

## 2020-12-07 DIAGNOSIS — E1122 Type 2 diabetes mellitus with diabetic chronic kidney disease: Secondary | ICD-10-CM | POA: Diagnosis not present

## 2020-12-07 DIAGNOSIS — M1380 Other specified arthritis, unspecified site: Secondary | ICD-10-CM | POA: Diagnosis not present

## 2020-12-07 DIAGNOSIS — G43909 Migraine, unspecified, not intractable, without status migrainosus: Secondary | ICD-10-CM | POA: Diagnosis not present

## 2020-12-07 DIAGNOSIS — Z20822 Contact with and (suspected) exposure to covid-19: Secondary | ICD-10-CM | POA: Diagnosis not present

## 2020-12-07 DIAGNOSIS — R6510 Systemic inflammatory response syndrome (SIRS) of non-infectious origin without acute organ dysfunction: Secondary | ICD-10-CM | POA: Diagnosis not present

## 2020-12-07 DIAGNOSIS — I2510 Atherosclerotic heart disease of native coronary artery without angina pectoris: Secondary | ICD-10-CM | POA: Diagnosis not present

## 2020-12-07 DIAGNOSIS — E1140 Type 2 diabetes mellitus with diabetic neuropathy, unspecified: Secondary | ICD-10-CM | POA: Diagnosis not present

## 2020-12-07 DIAGNOSIS — I214 Non-ST elevation (NSTEMI) myocardial infarction: Secondary | ICD-10-CM | POA: Diagnosis not present

## 2020-12-07 DIAGNOSIS — R262 Difficulty in walking, not elsewhere classified: Secondary | ICD-10-CM | POA: Diagnosis not present

## 2020-12-07 DIAGNOSIS — N183 Chronic kidney disease, stage 3 unspecified: Secondary | ICD-10-CM | POA: Diagnosis not present

## 2020-12-07 DIAGNOSIS — Z9181 History of falling: Secondary | ICD-10-CM | POA: Diagnosis not present

## 2020-12-07 DIAGNOSIS — R1312 Dysphagia, oropharyngeal phase: Secondary | ICD-10-CM | POA: Diagnosis not present

## 2020-12-07 DIAGNOSIS — Z8673 Personal history of transient ischemic attack (TIA), and cerebral infarction without residual deficits: Secondary | ICD-10-CM | POA: Diagnosis not present

## 2020-12-07 DIAGNOSIS — D649 Anemia, unspecified: Secondary | ICD-10-CM | POA: Diagnosis not present

## 2020-12-07 DIAGNOSIS — G894 Chronic pain syndrome: Secondary | ICD-10-CM | POA: Diagnosis not present

## 2020-12-07 DIAGNOSIS — Z7902 Long term (current) use of antithrombotics/antiplatelets: Secondary | ICD-10-CM | POA: Diagnosis not present

## 2020-12-07 DIAGNOSIS — R404 Transient alteration of awareness: Secondary | ICD-10-CM | POA: Diagnosis not present

## 2020-12-07 DIAGNOSIS — I5022 Chronic systolic (congestive) heart failure: Secondary | ICD-10-CM | POA: Diagnosis not present

## 2020-12-07 DIAGNOSIS — E860 Dehydration: Secondary | ICD-10-CM | POA: Diagnosis not present

## 2020-12-07 DIAGNOSIS — F17210 Nicotine dependence, cigarettes, uncomplicated: Secondary | ICD-10-CM | POA: Diagnosis not present

## 2020-12-07 DIAGNOSIS — G4733 Obstructive sleep apnea (adult) (pediatric): Secondary | ICD-10-CM | POA: Diagnosis not present

## 2020-12-07 DIAGNOSIS — I5032 Chronic diastolic (congestive) heart failure: Secondary | ICD-10-CM | POA: Diagnosis not present

## 2020-12-07 DIAGNOSIS — I10 Essential (primary) hypertension: Secondary | ICD-10-CM | POA: Diagnosis not present

## 2020-12-07 DIAGNOSIS — N1832 Chronic kidney disease, stage 3b: Secondary | ICD-10-CM | POA: Diagnosis not present

## 2020-12-07 DIAGNOSIS — R410 Disorientation, unspecified: Secondary | ICD-10-CM | POA: Diagnosis not present

## 2020-12-07 DIAGNOSIS — Z7982 Long term (current) use of aspirin: Secondary | ICD-10-CM | POA: Diagnosis not present

## 2020-12-08 DIAGNOSIS — A419 Sepsis, unspecified organism: Secondary | ICD-10-CM | POA: Diagnosis not present

## 2020-12-08 DIAGNOSIS — N183 Chronic kidney disease, stage 3 unspecified: Secondary | ICD-10-CM | POA: Diagnosis not present

## 2020-12-08 DIAGNOSIS — I5020 Unspecified systolic (congestive) heart failure: Secondary | ICD-10-CM | POA: Diagnosis not present

## 2020-12-08 DIAGNOSIS — J449 Chronic obstructive pulmonary disease, unspecified: Secondary | ICD-10-CM | POA: Diagnosis not present

## 2020-12-08 DIAGNOSIS — N390 Urinary tract infection, site not specified: Secondary | ICD-10-CM | POA: Diagnosis not present

## 2020-12-08 DIAGNOSIS — B9789 Other viral agents as the cause of diseases classified elsewhere: Secondary | ICD-10-CM | POA: Diagnosis not present

## 2020-12-08 DIAGNOSIS — I130 Hypertensive heart and chronic kidney disease with heart failure and stage 1 through stage 4 chronic kidney disease, or unspecified chronic kidney disease: Secondary | ICD-10-CM | POA: Diagnosis not present

## 2020-12-08 DIAGNOSIS — I959 Hypotension, unspecified: Secondary | ICD-10-CM | POA: Diagnosis not present

## 2020-12-11 DIAGNOSIS — I959 Hypotension, unspecified: Secondary | ICD-10-CM | POA: Diagnosis not present

## 2020-12-11 DIAGNOSIS — I130 Hypertensive heart and chronic kidney disease with heart failure and stage 1 through stage 4 chronic kidney disease, or unspecified chronic kidney disease: Secondary | ICD-10-CM | POA: Diagnosis not present

## 2020-12-11 DIAGNOSIS — R197 Diarrhea, unspecified: Secondary | ICD-10-CM | POA: Diagnosis not present

## 2020-12-11 DIAGNOSIS — J449 Chronic obstructive pulmonary disease, unspecified: Secondary | ICD-10-CM | POA: Diagnosis not present

## 2020-12-11 DIAGNOSIS — A419 Sepsis, unspecified organism: Secondary | ICD-10-CM | POA: Diagnosis not present

## 2020-12-11 DIAGNOSIS — N183 Chronic kidney disease, stage 3 unspecified: Secondary | ICD-10-CM | POA: Diagnosis not present

## 2020-12-11 DIAGNOSIS — N390 Urinary tract infection, site not specified: Secondary | ICD-10-CM | POA: Diagnosis not present

## 2020-12-11 DIAGNOSIS — I5020 Unspecified systolic (congestive) heart failure: Secondary | ICD-10-CM | POA: Diagnosis not present

## 2020-12-11 DIAGNOSIS — B9789 Other viral agents as the cause of diseases classified elsewhere: Secondary | ICD-10-CM | POA: Diagnosis not present

## 2020-12-12 DIAGNOSIS — N390 Urinary tract infection, site not specified: Secondary | ICD-10-CM | POA: Diagnosis not present

## 2020-12-12 DIAGNOSIS — I5020 Unspecified systolic (congestive) heart failure: Secondary | ICD-10-CM | POA: Diagnosis not present

## 2020-12-12 DIAGNOSIS — J449 Chronic obstructive pulmonary disease, unspecified: Secondary | ICD-10-CM | POA: Diagnosis not present

## 2020-12-12 DIAGNOSIS — I959 Hypotension, unspecified: Secondary | ICD-10-CM | POA: Diagnosis not present

## 2020-12-12 DIAGNOSIS — I130 Hypertensive heart and chronic kidney disease with heart failure and stage 1 through stage 4 chronic kidney disease, or unspecified chronic kidney disease: Secondary | ICD-10-CM | POA: Diagnosis not present

## 2020-12-12 DIAGNOSIS — A419 Sepsis, unspecified organism: Secondary | ICD-10-CM | POA: Diagnosis not present

## 2020-12-12 DIAGNOSIS — B9789 Other viral agents as the cause of diseases classified elsewhere: Secondary | ICD-10-CM | POA: Diagnosis not present

## 2020-12-12 DIAGNOSIS — R197 Diarrhea, unspecified: Secondary | ICD-10-CM | POA: Diagnosis not present

## 2020-12-12 DIAGNOSIS — N183 Chronic kidney disease, stage 3 unspecified: Secondary | ICD-10-CM | POA: Diagnosis not present

## 2020-12-14 DIAGNOSIS — R1312 Dysphagia, oropharyngeal phase: Secondary | ICD-10-CM | POA: Diagnosis not present

## 2020-12-14 DIAGNOSIS — N1832 Chronic kidney disease, stage 3b: Secondary | ICD-10-CM | POA: Diagnosis not present

## 2020-12-14 DIAGNOSIS — I6389 Other cerebral infarction: Secondary | ICD-10-CM | POA: Diagnosis not present

## 2020-12-14 DIAGNOSIS — Z743 Need for continuous supervision: Secondary | ICD-10-CM | POA: Diagnosis not present

## 2020-12-14 DIAGNOSIS — N390 Urinary tract infection, site not specified: Secondary | ICD-10-CM | POA: Diagnosis not present

## 2020-12-14 DIAGNOSIS — R6510 Systemic inflammatory response syndrome (SIRS) of non-infectious origin without acute organ dysfunction: Secondary | ICD-10-CM | POA: Diagnosis not present

## 2020-12-14 DIAGNOSIS — I2510 Atherosclerotic heart disease of native coronary artery without angina pectoris: Secondary | ICD-10-CM | POA: Diagnosis not present

## 2020-12-14 DIAGNOSIS — M1380 Other specified arthritis, unspecified site: Secondary | ICD-10-CM | POA: Diagnosis not present

## 2020-12-14 DIAGNOSIS — I214 Non-ST elevation (NSTEMI) myocardial infarction: Secondary | ICD-10-CM | POA: Diagnosis not present

## 2020-12-14 DIAGNOSIS — G894 Chronic pain syndrome: Secondary | ICD-10-CM | POA: Diagnosis not present

## 2020-12-14 DIAGNOSIS — G43909 Migraine, unspecified, not intractable, without status migrainosus: Secondary | ICD-10-CM | POA: Diagnosis not present

## 2020-12-14 DIAGNOSIS — J9611 Chronic respiratory failure with hypoxia: Secondary | ICD-10-CM | POA: Diagnosis not present

## 2020-12-14 DIAGNOSIS — E119 Type 2 diabetes mellitus without complications: Secondary | ICD-10-CM | POA: Diagnosis not present

## 2020-12-14 DIAGNOSIS — R6889 Other general symptoms and signs: Secondary | ICD-10-CM | POA: Diagnosis not present

## 2020-12-14 DIAGNOSIS — N189 Chronic kidney disease, unspecified: Secondary | ICD-10-CM | POA: Diagnosis not present

## 2020-12-14 DIAGNOSIS — E785 Hyperlipidemia, unspecified: Secondary | ICD-10-CM | POA: Diagnosis not present

## 2020-12-14 DIAGNOSIS — D649 Anemia, unspecified: Secondary | ICD-10-CM | POA: Diagnosis not present

## 2020-12-14 DIAGNOSIS — R262 Difficulty in walking, not elsewhere classified: Secondary | ICD-10-CM | POA: Diagnosis not present

## 2020-12-14 DIAGNOSIS — Z9181 History of falling: Secondary | ICD-10-CM | POA: Diagnosis not present

## 2020-12-14 DIAGNOSIS — R404 Transient alteration of awareness: Secondary | ICD-10-CM | POA: Diagnosis not present

## 2020-12-14 DIAGNOSIS — E1140 Type 2 diabetes mellitus with diabetic neuropathy, unspecified: Secondary | ICD-10-CM | POA: Diagnosis not present

## 2020-12-14 DIAGNOSIS — Z720 Tobacco use: Secondary | ICD-10-CM | POA: Diagnosis not present

## 2020-12-14 DIAGNOSIS — K219 Gastro-esophageal reflux disease without esophagitis: Secondary | ICD-10-CM | POA: Diagnosis not present

## 2020-12-14 DIAGNOSIS — J449 Chronic obstructive pulmonary disease, unspecified: Secondary | ICD-10-CM | POA: Diagnosis not present

## 2020-12-14 DIAGNOSIS — I5033 Acute on chronic diastolic (congestive) heart failure: Secondary | ICD-10-CM | POA: Diagnosis not present

## 2020-12-14 DIAGNOSIS — I10 Essential (primary) hypertension: Secondary | ICD-10-CM | POA: Diagnosis not present

## 2020-12-14 DIAGNOSIS — M6281 Muscle weakness (generalized): Secondary | ICD-10-CM | POA: Diagnosis not present

## 2020-12-14 NOTE — Progress Notes (Signed)
This encounter was created in error - please disregard.

## 2020-12-16 DIAGNOSIS — R6510 Systemic inflammatory response syndrome (SIRS) of non-infectious origin without acute organ dysfunction: Secondary | ICD-10-CM | POA: Diagnosis not present

## 2020-12-16 DIAGNOSIS — E119 Type 2 diabetes mellitus without complications: Secondary | ICD-10-CM | POA: Diagnosis not present

## 2020-12-16 DIAGNOSIS — D649 Anemia, unspecified: Secondary | ICD-10-CM | POA: Diagnosis not present

## 2020-12-16 DIAGNOSIS — N189 Chronic kidney disease, unspecified: Secondary | ICD-10-CM | POA: Diagnosis not present

## 2020-12-16 DIAGNOSIS — J449 Chronic obstructive pulmonary disease, unspecified: Secondary | ICD-10-CM | POA: Diagnosis not present

## 2020-12-16 DIAGNOSIS — I2510 Atherosclerotic heart disease of native coronary artery without angina pectoris: Secondary | ICD-10-CM | POA: Diagnosis not present

## 2020-12-27 DIAGNOSIS — H5789 Other specified disorders of eye and adnexa: Secondary | ICD-10-CM | POA: Diagnosis not present

## 2020-12-28 DIAGNOSIS — R062 Wheezing: Secondary | ICD-10-CM | POA: Diagnosis not present

## 2020-12-29 DIAGNOSIS — R918 Other nonspecific abnormal finding of lung field: Secondary | ICD-10-CM | POA: Diagnosis not present

## 2021-01-11 DIAGNOSIS — R918 Other nonspecific abnormal finding of lung field: Secondary | ICD-10-CM | POA: Diagnosis not present

## 2021-01-11 DIAGNOSIS — I517 Cardiomegaly: Secondary | ICD-10-CM | POA: Diagnosis not present

## 2021-01-13 DIAGNOSIS — I10 Essential (primary) hypertension: Secondary | ICD-10-CM | POA: Diagnosis not present

## 2021-01-13 DIAGNOSIS — J449 Chronic obstructive pulmonary disease, unspecified: Secondary | ICD-10-CM | POA: Diagnosis not present

## 2021-01-13 DIAGNOSIS — R309 Painful micturition, unspecified: Secondary | ICD-10-CM | POA: Diagnosis not present

## 2021-01-13 DIAGNOSIS — I5030 Unspecified diastolic (congestive) heart failure: Secondary | ICD-10-CM | POA: Diagnosis not present

## 2021-01-14 DIAGNOSIS — E785 Hyperlipidemia, unspecified: Secondary | ICD-10-CM | POA: Diagnosis not present

## 2021-01-14 DIAGNOSIS — I10 Essential (primary) hypertension: Secondary | ICD-10-CM | POA: Diagnosis not present

## 2021-01-14 DIAGNOSIS — E119 Type 2 diabetes mellitus without complications: Secondary | ICD-10-CM | POA: Diagnosis not present

## 2021-01-14 DIAGNOSIS — I5030 Unspecified diastolic (congestive) heart failure: Secondary | ICD-10-CM | POA: Diagnosis not present

## 2021-01-14 DIAGNOSIS — N189 Chronic kidney disease, unspecified: Secondary | ICD-10-CM | POA: Diagnosis not present

## 2021-01-18 DIAGNOSIS — B351 Tinea unguium: Secondary | ICD-10-CM | POA: Diagnosis not present

## 2021-01-18 DIAGNOSIS — E1159 Type 2 diabetes mellitus with other circulatory complications: Secondary | ICD-10-CM | POA: Diagnosis not present

## 2021-01-19 DIAGNOSIS — H903 Sensorineural hearing loss, bilateral: Secondary | ICD-10-CM | POA: Diagnosis not present

## 2021-01-19 DIAGNOSIS — R42 Dizziness and giddiness: Secondary | ICD-10-CM | POA: Diagnosis not present

## 2021-01-19 DIAGNOSIS — H9311 Tinnitus, right ear: Secondary | ICD-10-CM | POA: Diagnosis not present

## 2021-01-20 DIAGNOSIS — J449 Chronic obstructive pulmonary disease, unspecified: Secondary | ICD-10-CM | POA: Diagnosis not present

## 2021-01-26 DIAGNOSIS — E119 Type 2 diabetes mellitus without complications: Secondary | ICD-10-CM | POA: Diagnosis not present

## 2021-01-27 DIAGNOSIS — N390 Urinary tract infection, site not specified: Secondary | ICD-10-CM | POA: Diagnosis not present

## 2021-01-31 DIAGNOSIS — H109 Unspecified conjunctivitis: Secondary | ICD-10-CM | POA: Diagnosis not present

## 2021-02-01 DIAGNOSIS — Z23 Encounter for immunization: Secondary | ICD-10-CM | POA: Diagnosis not present

## 2021-02-18 DIAGNOSIS — M25562 Pain in left knee: Secondary | ICD-10-CM | POA: Diagnosis not present

## 2021-02-18 DIAGNOSIS — M1712 Unilateral primary osteoarthritis, left knee: Secondary | ICD-10-CM | POA: Diagnosis not present

## 2021-02-20 DIAGNOSIS — J449 Chronic obstructive pulmonary disease, unspecified: Secondary | ICD-10-CM | POA: Diagnosis not present

## 2021-02-23 DIAGNOSIS — G8929 Other chronic pain: Secondary | ICD-10-CM | POA: Diagnosis not present

## 2021-02-23 DIAGNOSIS — I10 Essential (primary) hypertension: Secondary | ICD-10-CM | POA: Diagnosis not present

## 2021-02-23 DIAGNOSIS — J449 Chronic obstructive pulmonary disease, unspecified: Secondary | ICD-10-CM | POA: Diagnosis not present

## 2021-02-23 DIAGNOSIS — E119 Type 2 diabetes mellitus without complications: Secondary | ICD-10-CM | POA: Diagnosis not present

## 2021-02-23 DIAGNOSIS — I2510 Atherosclerotic heart disease of native coronary artery without angina pectoris: Secondary | ICD-10-CM | POA: Diagnosis not present

## 2021-02-23 DIAGNOSIS — E785 Hyperlipidemia, unspecified: Secondary | ICD-10-CM | POA: Diagnosis not present

## 2021-02-23 DIAGNOSIS — N189 Chronic kidney disease, unspecified: Secondary | ICD-10-CM | POA: Diagnosis not present

## 2021-03-01 DIAGNOSIS — H524 Presbyopia: Secondary | ICD-10-CM | POA: Diagnosis not present

## 2021-03-08 DIAGNOSIS — G8929 Other chronic pain: Secondary | ICD-10-CM | POA: Diagnosis not present

## 2021-03-18 DIAGNOSIS — R197 Diarrhea, unspecified: Secondary | ICD-10-CM | POA: Diagnosis not present

## 2021-03-19 DIAGNOSIS — R197 Diarrhea, unspecified: Secondary | ICD-10-CM | POA: Diagnosis not present

## 2021-03-22 DIAGNOSIS — J449 Chronic obstructive pulmonary disease, unspecified: Secondary | ICD-10-CM | POA: Diagnosis not present

## 2021-03-23 DIAGNOSIS — B351 Tinea unguium: Secondary | ICD-10-CM | POA: Diagnosis not present

## 2021-03-23 DIAGNOSIS — E1159 Type 2 diabetes mellitus with other circulatory complications: Secondary | ICD-10-CM | POA: Diagnosis not present
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
## 2021-05-17 ENCOUNTER — Telehealth (HOSPITAL_COMMUNITY): Payer: Self-pay | Admitting: Psychiatry

## 2021-06-29 ENCOUNTER — Ambulatory Visit (INDEPENDENT_AMBULATORY_CARE_PROVIDER_SITE_OTHER): Payer: Medicare Other | Admitting: Urology

## 2021-06-29 ENCOUNTER — Encounter: Payer: Self-pay | Admitting: Urology

## 2021-06-29 VITALS — BP 87/66 | HR 77

## 2021-06-29 DIAGNOSIS — N390 Urinary tract infection, site not specified: Secondary | ICD-10-CM | POA: Diagnosis not present

## 2021-06-29 LAB — BLADDER SCAN AMB NON-IMAGING: Scan Result: 3

## 2021-06-29 NOTE — Progress Notes (Signed)
? ?Assessment: ?1. Recurrent urinary tract infection   ? ? ?Plan: ?Request all recent culture results from [facility] for review. ?Please obtain urinalysis urinalysis for UA and culture.  Fax results to [phone] ?Once I have had a chance to review her recent culture results as well as the repeat urinalysis and culture, I will make recommendations for management and follow-up. ? ?Chief Complaint:  ?Chief Complaint  ?Patient presents with  ? Recurrent UTI  ? ? ?History of Present Illness: ? ?Mariah White is a 71 y.o. year old female who is seen in consultation from [facility] for evaluation of frequent UTIs.  No provider notes available for review today.  The patient reports a history of frequent UTIs for the past 6 months.  She has symptoms of frequency and dysuria.  No fevers, chills, or flank pain.  No gross hematuria.  She reports improvement in her symptoms while on antibiotic therapy with recurrence of her symptoms shortly after completing antibiotics.  No recent urinalysis or urine culture results available for review.  She is not currently on antibiotic therapy. ? ?Urine culture results: ?9/22 >100 K mixed flora ?6/22 >100 K Enterococcus ?6/22 >100 K Citrobacter ?5/22 >100 K Klebsiella ? ?CT abdomen and pelvis from 11/21 showed no renal or ureteral calculi, no renal mass, and no evidence of obstruction. ? ? ?Past Medical History:  ?Past Medical History:  ?Diagnosis Date  ? Anxiety   ? Arthritis   ? CHF (congestive heart failure) ([place])   ? Chronic back pain   ? Chronic pain   ? COPD (chronic obstructive pulmonary disease) ([place])   ? Coronary artery disease   ? Depression   ? GERD (gastroesophageal reflux disease)   ? Hypertension   ? Migraine   ? Stroke [facility])   ? ? ?Past Surgical History:  ?Past Surgical History:  ?Procedure Laterality Date  ? ABDOMINAL HYSTERECTOMY    ? breast lump removed    ? BREAST SURGERY    ? CHOLECYSTECTOMY    ? FOOT SURGERY    ? LEFT HEART  CATH AND CORONARY ANGIOGRAPHY N/A 08/16/2020  ? Procedure: LEFT HEART CATH AND CORONARY ANGIOGRAPHY;  Surgeon: Nelva Bush, MD;  Location: [facility] CV [facility];  Service: Cardiovascular;  Laterality: N/A;  ? ? ?Allergies:  ?Allergies  ?Allergen Reactions  ? Other   ?  Other reaction(s): Other (See Comments) ?SKIN IS THIN- WILL TEAR AND BRUISE EASILY!! PAPER TAPE IS TOLERATED.  ? Tape Other (See Comments)  ?  SKIN IS THIN- WILL TEAR AND BRUISE EASILY!! PAPER TAPE IS TOLERATED.  ? Penicillins Itching  ?  Has patient had a PCN reaction causing immediate rash, facial/tongue/throat swelling, SOB or lightheadedness with hypotension: Yes ?Has patient had a PCN reaction causing severe rash involving mucus membranes or skin necrosis: No ?Has patient had a PCN reaction that required hospitalization: No ?Has patient had a PCN reaction occurring within the last 10 years: No ?If all of the above answers are "NO", then may proceed with Cephalosporin use. ?  ? ? ?Family History:  ?Family History  ?Problem Relation Age of Onset  ? Bipolar disorder Daughter   ? Diabetes Mother   ? Stroke Father   ? Diabetes Sister   ? Diabetes Brother   ? Diabetes Brother   ? Colon cancer Sister   ? Colon cancer Sister   ? Pancreatic cancer Brother   ? Heart attack Brother   ? ? ?Social  History:  ?Social History  ? ?Tobacco Use  ? Smoking status: Every Day  ?  Packs/day: 1.00  ?  Types: Cigarettes  ?  Start date: 01/18/1959  ? Smokeless tobacco: Never  ? Tobacco comments:  ?  1 pack per day   ?Vaping Use  ? Vaping Use: Never used  ?Substance Use Topics  ? Alcohol use: No  ? Drug use: No  ? ? ?Review of symptoms:  ?Constitutional:  Negative for unexplained weight loss, night sweats, fever, chills ?ENT:  Negative for nose bleeds, sinus pain, painful swallowing ?CV:  Negative for chest pain, shortness of breath, exercise intolerance, palpitations, loss of consciousness ?Resp:  Negative for cough, wheezing, shortness of breath ?GI:  Negative for  nausea, vomiting, diarrhea, bloody stools ?GU:  Positives noted in HPI; otherwise negative for gross hematuria,  urinary incontinence ?Neuro:  Negative for seizures, poor balance, limb weakness, slurred speech ?Psych:  Negative for lack of energy, depression, anxiety ?Endocrine:  Negative for polydipsia, polyuria, symptoms of hypoglycemia (dizziness, hunger, sweating) ?Hematologic:  Negative for anemia, purpura, petechia, prolonged or excessive bleeding, use of anticoagulants  ?Allergic:  Negative for difficulty breathing or choking as a result of exposure to anything; no shellfish allergy; no allergic response (rash/itch) to materials, foods ? ?Physical exam: ?BP (!) 87/66   Pulse 77  ?GENERAL APPEARANCE:  Well appearing, well developed, well nourished, NAD ?HEENT: Atraumatic, Normocephalic, oropharynx clear. ?NECK: Supple without lymphadenopathy or thyromegaly. ?LUNGS: Clear to auscultation bilaterally. ?HEART: Regular Rate and Rhythm without murmurs, gallops, or rubs. ?ABDOMEN: Soft, non-tender, No Masses. ?EXTREMITIES: Without clubbing, cyanosis, or edema. ?NEUROLOGIC:  Alert and oriented x 3, in wheelchair, CN II-XII grossly intact.  ?MENTAL STATUS:  Appropriate. ?BACK:  Non-tender to palpation.  No CVAT ?SKIN:  Warm, dry and intact.   ? ?Results: ?U/A:  specimen contaminated with stool ? ?PVR:  3 ml ? ?

## 2021-09-09 ENCOUNTER — Ambulatory Visit: Payer: Medicare Other | Admitting: Urology

## 2021-09-09 NOTE — Progress Notes (Deleted)
Assessment: 1. Recurrent urinary tract infection      Plan: Request all recent culture results from [facility] for review. Please obtain urinalysis urinalysis for UA and culture.  Fax results to [phone] Once I have had a chance to review her recent culture results as well as the repeat urinalysis and culture, I will make recommendations for management and follow-up.  Chief Complaint:  No chief complaint on file.   History of Present Illness:  Mariah White is a 71 y.o. year old female who is seen for further evaluation of frequent UTIs.  At her initial visit in March 2023, she reported a history of frequent UTIs for the past 6 months.  She has symptoms of frequency and dysuria.  No fevers, chills, or flank pain.  No gross hematuria.  She reported improvement in her symptoms while on antibiotic therapy with recurrence of her symptoms shortly after completing antibiotics.  No recent urinalysis or urine culture results available for review.  She was not on antibiotic therapy at the time of her visit in March 2023.  Urine culture results: 1/23 >100K E. Coli 10/22 >100K ESBL E. coli 9/22 >100 K mixed flora 7/22 >100 K mixed flora 6/22 >100 K Enterococcus 6/22 >100 K Citrobacter 5/22 >100 K Klebsiella  CT abdomen and pelvis from 11/21 showed no renal or ureteral calculi, no renal mass, and no evidence of obstruction.  Portions of the above documentation were copied from a prior visit for review purposes only.   Past Medical History:  Past Medical History:  Diagnosis Date   Anxiety    Arthritis    CHF (congestive heart failure) (HCC)    Chronic back pain    Chronic pain    COPD (chronic obstructive pulmonary disease) (HCC)    Coronary artery disease    Depression    GERD (gastroesophageal reflux disease)    Hypertension    Migraine    Stroke [facility])     Past Surgical History:  Past Surgical History:  Procedure Laterality Date   ABDOMINAL  HYSTERECTOMY     breast lump removed     BREAST SURGERY     CHOLECYSTECTOMY     FOOT SURGERY     LEFT HEART CATH AND CORONARY ANGIOGRAPHY N/A 08/16/2020   Procedure: LEFT HEART CATH AND CORONARY ANGIOGRAPHY;  Surgeon: Yvonne Kendall, MD;  Location: [facility];  Service: Cardiovascular;  Laterality: N/A;    Allergies:  Allergies  Allergen Reactions   Other     Other reaction(s): Other (See Comments) SKIN IS THIN- WILL TEAR AND BRUISE EASILY!! PAPER TAPE IS TOLERATED.   Tape Other (See Comments)    SKIN IS THIN- WILL TEAR AND BRUISE EASILY!! PAPER TAPE IS TOLERATED.   Penicillins Itching    Has patient had a PCN reaction causing immediate rash, facial/tongue/throat swelling, SOB or lightheadedness with hypotension: Yes Has patient had a PCN reaction causing severe rash involving mucus membranes or skin necrosis: No Has patient had a PCN reaction that required hospitalization: No Has patient had a PCN reaction occurring within the last 10 years: No If all of the above answers are "NO", then may proceed with Cephalosporin use.     Family History:  Family History  Problem Relation Age of Onset   Bipolar disorder Daughter    Diabetes Mother    Stroke Father    Diabetes Sister    Diabetes Brother    Diabetes Brother    Colon cancer  Sister    Colon cancer Sister    Pancreatic cancer Brother    Heart attack Brother     Social History:  Social History   Tobacco Use   Smoking status: Every Day    Packs/day: 1.00    Types: Cigarettes    Start date: 01/18/1959   Smokeless tobacco: Never   Tobacco comments:    1 pack per day   Vaping Use   Vaping Use: Never used  Substance Use Topics   Alcohol use: No   Drug use: No    ROS: Constitutional:  Negative for fever, chills, weight loss CV: Negative for chest pain, previous MI, hypertension Respiratory:  Negative for shortness of breath, wheezing, sleep apnea, frequent cough GI:  Negative for nausea, vomiting,  bloody stool, GERD  Physical exam: There were no vitals taken for this visit. GENERAL APPEARANCE:  Well appearing, well developed, well nourished, NAD HEENT:  Atraumatic, normocephalic, oropharynx clear NECK:  Supple without lymphadenopathy or thyromegaly ABDOMEN:  Soft, non-tender, no masses EXTREMITIES:  Moves all extremities well, without clubbing, cyanosis, or edema NEUROLOGIC:  Alert and oriented x 3, normal gait, CN II-XII grossly intact MENTAL STATUS:  appropriate BACK:  Non-tender to palpation, No CVAT SKIN:  Warm, dry, and intact  Results: U/A:

## 2022-03-20 ENCOUNTER — Encounter: Payer: Self-pay | Admitting: Gastroenterology

## 2022-03-20 ENCOUNTER — Telehealth: Payer: Self-pay | Admitting: *Deleted

## 2022-03-20 ENCOUNTER — Ambulatory Visit (INDEPENDENT_AMBULATORY_CARE_PROVIDER_SITE_OTHER): Payer: Medicare Other | Admitting: Gastroenterology

## 2022-03-20 VITALS — BP 107/70 | HR 66 | Temp 97.6°F | Ht 61.5 in | Wt 122.0 lb

## 2022-03-20 DIAGNOSIS — A09 Infectious gastroenteritis and colitis, unspecified: Secondary | ICD-10-CM

## 2022-03-20 DIAGNOSIS — R1319 Other dysphagia: Secondary | ICD-10-CM | POA: Diagnosis not present

## 2022-03-20 NOTE — Telephone Encounter (Signed)
LM on vm at [facility] to call back to schedule EGD/ED ASA 3 with Dr.Rourkl

## 2022-03-20 NOTE — Patient Instructions (Signed)
Plan for upper endoscopy to evaluate dysphagia.  Collect stool for GI profile.

## 2022-03-20 NOTE — Progress Notes (Signed)
GI Office Note    Referring Provider: [facility], [facility] Primary Care Physician:  Charlynne Pander, MD  Primary Gastroenterologist: Roetta Sessions, MD   Chief Complaint   Chief Complaint  Patient presents with   Dysphagia    Food gets hung in throat      History of Present Illness   Mariah White is a 71 y.o. female presenting today at the request of provider at [facility] for further evaluation of dysphagia.   Patient states she has esophagus stretched years ago in [place]. I can't locate records. States it helped. Currently she has been having trouble swallowing bread, crackers, meat. She states at times the food gets lodged. Sometimes she has to bring it up to get relief. Recent episode on chicken. She has chosen to lay off of meat for now. No heartburn. She states she has been having diarrhea for weeks. BM 3 per day. Taking imodium as needed. She states she is currently being treated for a UTI.   Medications   Current Outpatient Medications  Medication Sig Dispense Refill   acetaminophen (TYLENOL) 650 MG CR tablet Take 650 mg by mouth every 4 (four) hours as needed for pain.     ACIDOPHILUS LACTOBACILLUS PO Take 1 tablet by mouth 2 (two) times daily.     ARIPiprazole (ABILIFY) 5 MG tablet Take 1 tablet (5 mg total) by mouth at bedtime. 90 tablet 2   atorvastatin (LIPITOR) 40 MG tablet Take 40 mg by mouth daily.     clopidogrel (PLAVIX) 75 MG tablet Take 75 mg by mouth in the morning.     Cranberry 450 MG TABS Take 1 tablet by mouth 2 (two) times daily.     DULoxetine (CYMBALTA) 60 MG capsule Take 1 capsule (60 mg total) by mouth daily. (Patient taking differently: Take 60 mg by mouth in the morning.) 30 capsule 3   gabapentin (NEURONTIN) 100 MG capsule Take 200 mg by mouth 3 (three) times daily.     INCRUSE ELLIPTA 62.5 MCG/ACT AEPB Inhale 1 puff into the lungs daily.     loperamide (IMODIUM) 2 MG capsule Take 2 mg by mouth as needed for diarrhea or loose stools.      metoprolol tartrate (LOPRESSOR) 25 MG tablet Take 0.5 tablets (12.5 mg total) by mouth 2 (two) times daily. 60 tablet 1   omeprazole (PRILOSEC) 20 MG capsule Take 20 mg by mouth daily.     oxyCODONE-acetaminophen (PERCOCET/ROXICET) 5-325 MG tablet Take 1 tablet by mouth 3 (three) times daily as needed.     OXYGEN Inhale 2 L/min into the lungs as needed (for shortness of breath).     phenazopyridine (PYRIDIUM) 100 MG tablet Take 100 mg by mouth 3 (three) times daily as needed for pain.     polyvinyl alcohol (LIQUIFILM TEARS) 1.4 % ophthalmic solution Place 2 drops into both eyes 3 (three) times daily as needed for dry eyes.     promethazine (PHENERGAN) 25 MG tablet Take 25 mg by mouth every 4 (four) hours as needed for nausea or vomiting.     senna (SENOKOT) 8.6 MG TABS tablet Take 1 tablet by mouth.     sitaGLIPtin (JANUVIA) 100 MG tablet Take 100 mg by mouth in the morning.     Vitamin D, Ergocalciferol, (DRISDOL) 1.25 MG (50000 UNIT) CAPS capsule Take 50,000 Units by mouth every 7 (seven) days.     No current facility-administered medications for this visit.    Allergies   Allergies  as of 03/20/2022 - Review Complete 03/20/2022  Allergen Reaction Noted   Other  08/14/2020   Tape Other (See Comments) 08/14/2020   Penicillins Itching 07/01/2014    Past Medical History   Past Medical History:  Diagnosis Date   Anxiety    Arthritis    CHF (congestive heart failure) (HCC)    Chronic back pain    Chronic pain    COPD (chronic obstructive pulmonary disease) (HCC)    Coronary artery disease    Depression    GERD (gastroesophageal reflux disease)    Hypertension    Migraine    Stroke [facility])     Past Surgical History   Past Surgical History:  Procedure Laterality Date   ABDOMINAL HYSTERECTOMY     breast lump removed     BREAST SURGERY     CHOLECYSTECTOMY     FOOT SURGERY     LEFT HEART CATH AND CORONARY ANGIOGRAPHY N/A 08/16/2020   Procedure: LEFT HEART CATH AND CORONARY  ANGIOGRAPHY;  Surgeon: Yvonne Kendall, MD;  Location: [facility];  Service: Cardiovascular;  Laterality: N/A;    Past Family History   Family History  Problem Relation Age of Onset   Bipolar disorder Daughter    Diabetes Mother    Stroke Father    Diabetes Sister    Diabetes Brother    Diabetes Brother    Colon cancer Sister    Colon cancer Sister    Pancreatic cancer Brother    Heart attack Brother     Past Social History   Social History   Socioeconomic History   Marital status: Single    Spouse name: Not on file   Number of children: Not on file   Years of education: Not on file   Highest education level: Not on file  Occupational History   Not on file  Tobacco Use   Smoking status: Former    Packs/day: 1.00    Years: 50.00    Total pack years: 50.00    Types: Cigarettes    Start date: 01/18/1959   Smokeless tobacco: Never   Tobacco comments:    1 pack per day   Vaping Use   Vaping Use: Never used  Substance and Sexual Activity   Alcohol use: No   Drug use: No   Sexual activity: Not Currently  Other Topics Concern   Not on file  Social History Narrative   Not on file   Social Determinants of Health   Financial Resource Strain: Not on file  Food Insecurity: Not on file  Transportation Needs: Not on file  Physical Activity: Not on file  Stress: Not on file  Social Connections: Not on file  Intimate Partner Violence: Not on file    Review of Systems   General: Negative for anorexia, weight loss, fever, chills, fatigue, weakness. Eyes: Negative for vision changes.  ENT: Negative for hoarseness,  nasal congestion. See hpi CV: Negative for chest pain, angina, palpitations, dyspnea on exertion, peripheral edema.  Respiratory: Negative for dyspnea at rest, dyspnea on exertion, cough, sputum, wheezing.  GI: See history of present illness. GU:  Negative for dysuria, hematuria, urinary incontinence, urinary frequency, nocturnal urination.  MS:  Negative for joint pain, low back pain.  Derm: Negative for rash or itching.  Neuro: Negative for weakness, abnormal sensation, seizure, frequent headaches, memory loss,  confusion.  Psych: Negative for anxiety, depression, suicidal ideation, hallucinations.  Endo: Negative for unusual weight change.  Heme: Negative for bruising  or bleeding. Allergy: Negative for rash or hives.  Physical Exam   BP 107/70 (BP Location: Left Arm, Patient Position: Sitting, Cuff Size: Normal)   Pulse 66   Temp 97.6 F (36.4 C) (Oral)   Ht 5' 1.5" (1.562 m)   Wt 122 lb (55.3 kg) Comment: facility reported, unable to weigh  SpO2 95%   BMI 22.68 kg/m    General: Well-nourished, well-developed in no acute distress.  Head: Normocephalic, atraumatic.   Eyes: Conjunctiva pink, no icterus. Mouth: Oropharyngeal mucosa moist and pink , no lesions erythema or exudate. Neck: Supple without thyromegaly, masses, or lymphadenopathy.  Lungs: Clear to auscultation bilaterally.  Heart: Regular rate and rhythm, no murmurs rubs or gallops.  Abdomen: Bowel sounds are normal,  nondistended, no hepatosplenomegaly or masses,  no abdominal bruits or hernia, no rebound or guarding.  Difficult to exam in wheelchair. Mild lower abdominal tenderness.  Rectal: not performed Extremities: No lower extremity edema. No clubbing or deformities.  Neuro: Alert and oriented x 4 , grossly normal neurologically.  Skin: Warm and dry, no rash or jaundice.   Psych: Alert and cooperative, normal mood and affect.  Labs   01/2022: H/H 10.1/32.1 (chronic stable), creatiine 0.9, Tbili 0.4, AP 125, AST 20, ALT 12, INR 1.09  Imaging Studies   No results found.  Assessment   Dysphagia: solid food dysphagia. Likely esophageal stricture. Describes near complete food impactions.   Diarrhea: reports daily diarrhea for several weeks. Describes recent antibiotics for UTI.    PLAN   GI profile. EGD/ED with Dr. Jena Gauss. ASA 3.  I have  discussed the risks, alternatives, benefits with regards to but not limited to the risk of reaction to medication, bleeding, infection, perforation and the patient is agreeable to proceed. Written consent to be obtained.    Leanna Battles. Melvyn Neth, MHS, PA-C [facility]

## 2022-04-28 ENCOUNTER — Encounter: Payer: Self-pay | Admitting: *Deleted

## 2022-04-28 ENCOUNTER — Telehealth: Payer: Self-pay | Admitting: *Deleted

## 2022-04-28 NOTE — Telephone Encounter (Signed)
Attempted to call pt for second time, unable to complete call, will mail letter to pt

## 2022-07-11 ENCOUNTER — Ambulatory Visit (INDEPENDENT_AMBULATORY_CARE_PROVIDER_SITE_OTHER): Payer: Medicare Other | Admitting: Gastroenterology

## 2022-07-11 ENCOUNTER — Encounter (INDEPENDENT_AMBULATORY_CARE_PROVIDER_SITE_OTHER): Payer: Self-pay | Admitting: Internal Medicine

## 2022-07-11 ENCOUNTER — Telehealth (INDEPENDENT_AMBULATORY_CARE_PROVIDER_SITE_OTHER): Payer: Self-pay | Admitting: Internal Medicine

## 2022-07-11 ENCOUNTER — Encounter: Payer: Self-pay | Admitting: Gastroenterology

## 2022-07-11 VITALS — BP 117/71 | HR 75 | Temp 97.9°F

## 2022-07-11 DIAGNOSIS — R1319 Other dysphagia: Secondary | ICD-10-CM

## 2022-07-11 DIAGNOSIS — R197 Diarrhea, unspecified: Secondary | ICD-10-CM

## 2022-07-11 NOTE — Telephone Encounter (Signed)
PA approved via [facility] for EGD and Dilation  Notification or Prior Authorization is not required for the requested services You are not required to submit a notification/prior authorization based on the information provided. The number above acknowledges your inquiry and our response. Please write this number down and refer to it for future inquiries. If you still wish to submit your request for review, please select the Continue with Submission button below. Decision ID #: [id number]

## 2022-07-11 NOTE — Patient Instructions (Addendum)
We are going to schedule you for an upper endoscopy with dilation in the near future with Dr. Jena Gauss.   Continue to chew food adequately prior to swallowing.  Please cut meats and other foods into small bites and alternate with sips of liquids.  Patient has complaints of diarrhea, stating she is having about 3 loose stools per day.  Please stop senna for 1 week to see if this improves loose stools.  If loose stools continue that she may continue to use Imodium as needed. If loose stools continue will check CBC, CMP, TSH, fecal elastase.  Would recommend stopping increasing famotidine from 20 mg once daily to 20 mg twice daily.  It was a pleasure to see you today. I want to create trusting relationships with patients. If you receive a survey regarding your visit,  I greatly appreciate you taking time to fill this out on paper or through your MyChart. I value your feedback.  Brooke Bonito, MSN, FNP-BC, AGACNP-BC [facility]

## 2022-07-11 NOTE — H&P (View-Only) (Signed)
 GI Office Note    Referring Provider: Blass, Joel, MD Primary Care Physician:  Blass, Joel, MD Primary Gastroenterologist: Robert M.Rourk, MD  Date:  07/11/2022  ID:  Mariah White, DOB 11/26/1950, MRN [id number]   Chief Complaint   Chief Complaint  Patient presents with   Dysphagia    Referred for dysphagia. Reports she gets choked on solid foods. Can swallow liquids and pills.     History of Present Illness  Mariah White is a 72 y.o. female with a history of anxiety, COPD, CAD, depression, GERD, HTN, migraines, stroke, and CHF presenting today with complaint of dysphagia.  Last office visit 03/20/22.  Reported history of esophageal stretching years prior in [place].  Unable to help records.  Noted some improvement after that.  Recently had been having trouble swallowing bread, crackers, and meats, feeling like food gets lodged in her throat.  Also with diarrhea for few weeks usually 3 bowel movements per day.  Taking Imodium as needed.  Also being treated for UTI.  Advised to check GI profile to assess for infectious cause of diarrhea.  Scheduled for EGD/ED with Dr. Rourk.  Our staff was unable to get in touch with facility to arrange for EGD/ED.  Today: Dysphagia -having trouble primarily with solids.  At times medications feel like they get stuck.  No issues with liquids.  Reports a few episodes of food getting stuck and feel like she is choking and she has needed to have helped.    Current Outpatient Medications  Medication Sig Dispense Refill   acetaminophen (TYLENOL) 650 MG CR tablet Take 650 mg by mouth every 4 (four) hours as needed for pain.     ACIDOPHILUS LACTOBACILLUS PO Take 1 tablet by mouth 2 (two) times daily.     ARIPiprazole (ABILIFY) 5 MG tablet Take 1 tablet (5 mg total) by mouth at bedtime. 90 tablet 2   atorvastatin (LIPITOR) 40 MG tablet Take 40 mg by mouth daily.     cholecalciferol (VITAMIN D3) 25 MCG (1000 UNIT) tablet Take 1,000 Units by mouth  daily. 2 every day     clopidogrel (PLAVIX) 75 MG tablet Take 75 mg by mouth in the morning.     Cranberry 450 MG TABS Take 1 tablet by mouth 2 (two) times daily.     DULoxetine (CYMBALTA) 60 MG capsule Take 1 capsule (60 mg total) by mouth daily. (Patient taking differently: Take 60 mg by mouth in the morning.) 30 capsule 3   famotidine (PEPCID) 10 MG tablet Take 10 mg by mouth. 2 qam     gabapentin (NEURONTIN) 100 MG capsule Take 200 mg by mouth 3 (three) times daily.     INCRUSE ELLIPTA 62.5 MCG/ACT AEPB Inhale 1 puff into the lungs daily.     loperamide (IMODIUM) 2 MG capsule Take 2 mg by mouth as needed for diarrhea or loose stools.     metoprolol tartrate (LOPRESSOR) 25 MG tablet Take 0.5 tablets (12.5 mg total) by mouth 2 (two) times daily. 60 tablet 1   ondansetron (ZOFRAN) 4 MG tablet Take 4 mg by mouth every 8 (eight) hours as needed for nausea or vomiting.     oxyCODONE-acetaminophen (PERCOCET/ROXICET) 5-325 MG tablet Take 1 tablet by mouth 3 (three) times daily as needed.     OXYGEN Inhale 2 L/min into the lungs as needed (for shortness of breath).     phenazopyridine (PYRIDIUM) 100 MG tablet Take 100 mg by mouth 3 (three) times daily as needed   for pain.     polyvinyl alcohol (LIQUIFILM TEARS) 1.4 % ophthalmic solution Place 2 drops into both eyes 3 (three) times daily as needed for dry eyes.     senna (SENOKOT) 8.6 MG TABS tablet Take 1 tablet by mouth.     sitaGLIPtin (JANUVIA) 100 MG tablet Take 100 mg by mouth in the morning.     omeprazole (PRILOSEC) 20 MG capsule Take 20 mg by mouth daily. (Patient not taking: Reported on 07/11/2022)     promethazine (PHENERGAN) 25 MG tablet Take 25 mg by mouth every 4 (four) hours as needed for nausea or vomiting. (Patient not taking: Reported on 07/11/2022)     No current facility-administered medications for this visit.    Past Medical History:  Diagnosis Date   Anxiety    Arthritis    CHF (congestive heart failure)    Chronic back pain     Chronic pain    COPD (chronic obstructive pulmonary disease)    Coronary artery disease    Depression    GERD (gastroesophageal reflux disease)    Hypertension    Migraine    Stroke     Past Surgical History:  Procedure Laterality Date   ABDOMINAL HYSTERECTOMY     breast lump removed     BREAST SURGERY     CHOLECYSTECTOMY     FOOT SURGERY     LEFT HEART CATH AND CORONARY ANGIOGRAPHY N/A 08/16/2020   Procedure: LEFT HEART CATH AND CORONARY ANGIOGRAPHY;  Surgeon: End, Christopher, MD;  Location: [facility];  Service: Cardiovascular;  Laterality: N/A;    Family History  Problem Relation Age of Onset   Bipolar disorder Daughter    Diabetes Mother    Stroke Father    Diabetes Sister    Diabetes Brother    Diabetes Brother    Colon cancer Sister    Colon cancer Sister    Pancreatic cancer Brother    Heart attack Brother     Allergies as of 07/11/2022 - Review Complete 07/11/2022  Allergen Reaction Noted   Other  08/14/2020   Tape Other (See Comments) 08/14/2020   Penicillins Itching 07/01/2014    Social History   Socioeconomic History   Marital status: Single    Spouse name: Not on file   Number of children: Not on file   Years of education: Not on file   Highest education level: Not on file  Occupational History   Not on file  Tobacco Use   Smoking status: Former    Packs/day: 1.00    Years: 50.00    Additional pack years: 0.00    Total pack years: 50.00    Types: Cigarettes    Start date: 01/18/1959    Passive exposure: Past   Smokeless tobacco: Never   Tobacco comments:    1 pack per day   Vaping Use   Vaping Use: Never used  Substance and Sexual Activity   Alcohol use: No   Drug use: No   Sexual activity: Not Currently  Other Topics Concern   Not on file  Social History Narrative   Not on file   Social Determinants of Health   Financial Resource Strain: Not on file  Food Insecurity: Not on file  Transportation Needs: Not on file   Physical Activity: Not on file  Stress: Not on file  Social Connections: Not on file     Review of Systems   Gen: Denies fever, chills, anorexia. Denies fatigue, weakness, weight   loss.  CV: Denies chest pain, palpitations, syncope, peripheral edema, and claudication. Resp: Denies dyspnea at rest, cough, wheezing, coughing up blood, and pleurisy. GI: See HPI Derm: Denies rash, itching, dry skin Psych: Denies depression, anxiety, memory loss, confusion. No homicidal or suicidal ideation.  Heme: Denies bruising, bleeding, and enlarged lymph nodes.   Physical Exam   BP 117/71 (BP Location: Left Arm, Patient Position: Sitting, Cuff Size: Normal)   Pulse 75   Temp 97.9 F (36.6 C) (Oral)   General:   Alert and oriented. No distress noted. Pleasant and cooperative.  Head:  Normocephalic and atraumatic. Eyes:  Conjuctiva clear without scleral icterus. Mouth:  Oral mucosa pink and moist. Good dentition. No lesions. Lungs:  Clear to auscultation bilaterally. No wheezes, rales, or rhonchi. No distress.  Heart:  S1, S2 present without murmurs appreciated.  Abdomen:  +BS, soft, non-distended.  Mild TTP to epigastrium.  No rebound or guarding. No HSM or masses noted. Rectal: deferred Msk:  Symmetrical without gross deformities. Normal posture. Extremities:  Without edema. Neurologic:  Alert and  oriented x4 Psych:  Alert and cooperative. Normal mood and affect.   Assessment  Mariah White is a 72 y.o. female with a history of anxiety, COPD, CAD, depression, GERD, HTN, migraines, stroke, and CHF presenting today with complaint of dysphagia.  Dysphagia: Ongoing complaints of solid food dysphagia.  Previously attempted to get scheduled for upper endoscopy with dilation at last office visit but was unable to get in touch with facility to schedule.  Patient continuing to have issues.  Has to cut her food into small bites and still at times has trouble.  Reports a few choking episodes in the  past.  We discussed ongoing dysphagia precautions and will attempt to reschedule her upper endoscopy with dilation.  Diarrhea: Reports a history of 6 months of intermittent diarrhea.  At times may have 3 loose bowel movements per day.  Does have a history of taking antibiotics but none recently.  Per review of her MAR it appears she has been getting Senokot daily.  Advised to stop this for 1 week and monitor for ongoing loose stools.  She may use Imodium as needed.  If loose stools continued despite stopping Senokot we will plan to check CBC, CMP, TSH, fecal elastase.  PLAN   Proceed with upper endoscopy with esophageal dilation with propofol by Dr. Rourk in near future: the risks, benefits, and alternatives have been discussed with the patient in detail. The patient states understanding and desires to proceed. ASA 3 Hold Januvia for 3 days prior  Continue famotidine 20 mg, increase to twice daily. Continue to chew foods adequately prior to swallowing, alternating with small bites and sips of liquids. Stop senna for 1 week.  If loose stools continue will check CBC, CMP, TSH, fecal elastase. Continue Imodium as needed. Follow-up in 3 months, sooner if needed    Mariah Bierlein, MSN, FNP-BC, AGACNP-BC [facility] 

## 2022-07-11 NOTE — Progress Notes (Signed)
GI Office Note    Referring Provider: Charlynne Pander, MD Primary Care Physician:  Charlynne Pander, MD Primary Gastroenterologist: Gerrit Friends.Rourk, MD  Date:  07/11/2022  ID:  Mariah White, DOB 07-Oct-1950, MRN [id number]   Chief Complaint   Chief Complaint  Patient presents with   Dysphagia    Referred for dysphagia. Reports she gets choked on solid foods. Can swallow liquids and pills.     History of Present Illness  Mariah White is a 72 y.o. female with a history of anxiety, COPD, CAD, depression, GERD, HTN, migraines, stroke, and CHF presenting today with complaint of dysphagia.  Last office visit 03/20/22.  Reported history of esophageal stretching years prior in [place].  Unable to help records.  Noted some improvement after that.  Recently had been having trouble swallowing bread, crackers, and meats, feeling like food gets lodged in her throat.  Also with diarrhea for few weeks usually 3 bowel movements per day.  Taking Imodium as needed.  Also being treated for UTI.  Advised to check GI profile to assess for infectious cause of diarrhea.  Scheduled for EGD/ED with Dr. Jena Gauss.  Our staff was unable to get in touch with facility to arrange for EGD/ED.  Today: Dysphagia -having trouble primarily with solids.  At times medications feel like they get stuck.  No issues with liquids.  Reports a few episodes of food getting stuck and feel like she is choking and she has needed to have helped.    Current Outpatient Medications  Medication Sig Dispense Refill   acetaminophen (TYLENOL) 650 MG CR tablet Take 650 mg by mouth every 4 (four) hours as needed for pain.     ACIDOPHILUS LACTOBACILLUS PO Take 1 tablet by mouth 2 (two) times daily.     ARIPiprazole (ABILIFY) 5 MG tablet Take 1 tablet (5 mg total) by mouth at bedtime. 90 tablet 2   atorvastatin (LIPITOR) 40 MG tablet Take 40 mg by mouth daily.     cholecalciferol (VITAMIN D3) 25 MCG (1000 UNIT) tablet Take 1,000 Units by mouth  daily. 2 every day     clopidogrel (PLAVIX) 75 MG tablet Take 75 mg by mouth in the morning.     Cranberry 450 MG TABS Take 1 tablet by mouth 2 (two) times daily.     DULoxetine (CYMBALTA) 60 MG capsule Take 1 capsule (60 mg total) by mouth daily. (Patient taking differently: Take 60 mg by mouth in the morning.) 30 capsule 3   famotidine (PEPCID) 10 MG tablet Take 10 mg by mouth. 2 qam     gabapentin (NEURONTIN) 100 MG capsule Take 200 mg by mouth 3 (three) times daily.     INCRUSE ELLIPTA 62.5 MCG/ACT AEPB Inhale 1 puff into the lungs daily.     loperamide (IMODIUM) 2 MG capsule Take 2 mg by mouth as needed for diarrhea or loose stools.     metoprolol tartrate (LOPRESSOR) 25 MG tablet Take 0.5 tablets (12.5 mg total) by mouth 2 (two) times daily. 60 tablet 1   ondansetron (ZOFRAN) 4 MG tablet Take 4 mg by mouth every 8 (eight) hours as needed for nausea or vomiting.     oxyCODONE-acetaminophen (PERCOCET/ROXICET) 5-325 MG tablet Take 1 tablet by mouth 3 (three) times daily as needed.     OXYGEN Inhale 2 L/min into the lungs as needed (for shortness of breath).     phenazopyridine (PYRIDIUM) 100 MG tablet Take 100 mg by mouth 3 (three) times daily as needed  for pain.     polyvinyl alcohol (LIQUIFILM TEARS) 1.4 % ophthalmic solution Place 2 drops into both eyes 3 (three) times daily as needed for dry eyes.     senna (SENOKOT) 8.6 MG TABS tablet Take 1 tablet by mouth.     sitaGLIPtin (JANUVIA) 100 MG tablet Take 100 mg by mouth in the morning.     omeprazole (PRILOSEC) 20 MG capsule Take 20 mg by mouth daily. (Patient not taking: Reported on 07/11/2022)     promethazine (PHENERGAN) 25 MG tablet Take 25 mg by mouth every 4 (four) hours as needed for nausea or vomiting. (Patient not taking: Reported on 07/11/2022)     No current facility-administered medications for this visit.    Past Medical History:  Diagnosis Date   Anxiety    Arthritis    CHF (congestive heart failure)    Chronic back pain     Chronic pain    COPD (chronic obstructive pulmonary disease)    Coronary artery disease    Depression    GERD (gastroesophageal reflux disease)    Hypertension    Migraine    Stroke     Past Surgical History:  Procedure Laterality Date   ABDOMINAL HYSTERECTOMY     breast lump removed     BREAST SURGERY     CHOLECYSTECTOMY     FOOT SURGERY     LEFT HEART CATH AND CORONARY ANGIOGRAPHY N/A 08/16/2020   Procedure: LEFT HEART CATH AND CORONARY ANGIOGRAPHY;  Surgeon: Yvonne Kendall, MD;  Location: [facility];  Service: Cardiovascular;  Laterality: N/A;    Family History  Problem Relation Age of Onset   Bipolar disorder Daughter    Diabetes Mother    Stroke Father    Diabetes Sister    Diabetes Brother    Diabetes Brother    Colon cancer Sister    Colon cancer Sister    Pancreatic cancer Brother    Heart attack Brother     Allergies as of 07/11/2022 - Review Complete 07/11/2022  Allergen Reaction Noted   Other  08/14/2020   Tape Other (See Comments) 08/14/2020   Penicillins Itching 07/01/2014    Social History   Socioeconomic History   Marital status: Single    Spouse name: Not on file   Number of children: Not on file   Years of education: Not on file   Highest education level: Not on file  Occupational History   Not on file  Tobacco Use   Smoking status: Former    Packs/day: 1.00    Years: 50.00    Additional pack years: 0.00    Total pack years: 50.00    Types: Cigarettes    Start date: 01/18/1959    Passive exposure: Past   Smokeless tobacco: Never   Tobacco comments:    1 pack per day   Vaping Use   Vaping Use: Never used  Substance and Sexual Activity   Alcohol use: No   Drug use: No   Sexual activity: Not Currently  Other Topics Concern   Not on file  Social History Narrative   Not on file   Social Determinants of Health   Financial Resource Strain: Not on file  Food Insecurity: Not on file  Transportation Needs: Not on file   Physical Activity: Not on file  Stress: Not on file  Social Connections: Not on file     Review of Systems   Gen: Denies fever, chills, anorexia. Denies fatigue, weakness, weight  loss.  CV: Denies chest pain, palpitations, syncope, peripheral edema, and claudication. Resp: Denies dyspnea at rest, cough, wheezing, coughing up blood, and pleurisy. GI: See HPI Derm: Denies rash, itching, dry skin Psych: Denies depression, anxiety, memory loss, confusion. No homicidal or suicidal ideation.  Heme: Denies bruising, bleeding, and enlarged lymph nodes.   Physical Exam   BP 117/71 (BP Location: Left Arm, Patient Position: Sitting, Cuff Size: Normal)   Pulse 75   Temp 97.9 F (36.6 C) (Oral)   General:   Alert and oriented. No distress noted. Pleasant and cooperative.  Head:  Normocephalic and atraumatic. Eyes:  Conjuctiva clear without scleral icterus. Mouth:  Oral mucosa pink and moist. Good dentition. No lesions. Lungs:  Clear to auscultation bilaterally. No wheezes, rales, or rhonchi. No distress.  Heart:  S1, S2 present without murmurs appreciated.  Abdomen:  +BS, soft, non-distended.  Mild TTP to epigastrium.  No rebound or guarding. No HSM or masses noted. Rectal: deferred Msk:  Symmetrical without gross deformities. Normal posture. Extremities:  Without edema. Neurologic:  Alert and  oriented x4 Psych:  Alert and cooperative. Normal mood and affect.   Assessment  Mariah ColtMary A Zettel is a 72 y.o. female with a history of anxiety, COPD, CAD, depression, GERD, HTN, migraines, stroke, and CHF presenting today with complaint of dysphagia.  Dysphagia: Ongoing complaints of solid food dysphagia.  Previously attempted to get scheduled for upper endoscopy with dilation at last office visit but was unable to get in touch with facility to schedule.  Patient continuing to have issues.  Has to cut her food into small bites and still at times has trouble.  Reports a few choking episodes in the  past.  We discussed ongoing dysphagia precautions and will attempt to reschedule her upper endoscopy with dilation.  Diarrhea: Reports a history of 6 months of intermittent diarrhea.  At times may have 3 loose bowel movements per day.  Does have a history of taking antibiotics but none recently.  Per review of her MAR it appears she has been getting Senokot daily.  Advised to stop this for 1 week and monitor for ongoing loose stools.  She may use Imodium as needed.  If loose stools continued despite stopping Senokot we will plan to check CBC, CMP, TSH, fecal elastase.  PLAN   Proceed with upper endoscopy with esophageal dilation with propofol by Dr. Jena Gaussourk in near future: the risks, benefits, and alternatives have been discussed with the patient in detail. The patient states understanding and desires to proceed. ASA 3 Hold Januvia for 3 days prior  Continue famotidine 20 mg, increase to twice daily. Continue to chew foods adequately prior to swallowing, alternating with small bites and sips of liquids. Stop senna for 1 week.  If loose stools continue will check CBC, CMP, TSH, fecal elastase. Continue Imodium as needed. Follow-up in 3 months, sooner if needed    Brooke Bonitoourtney Rosaire Cueto, MSN, FNP-BC, AGACNP-BC [facility]

## 2022-07-26 ENCOUNTER — Encounter (INDEPENDENT_AMBULATORY_CARE_PROVIDER_SITE_OTHER): Payer: Self-pay

## 2022-07-26 NOTE — Telephone Encounter (Signed)
Fax pre op appt information to [employer] number [phone]

## 2022-07-27 NOTE — Patient Instructions (Signed)
Mariah White  07/27/2022     @   Your procedure is scheduled on  08/02/2022.   Report to [facility] at  0745  A.M.   Call this number if you have problems the morning of surgery:  [phone]  If you experience any cold or flu symptoms such as cough, fever, chills, shortness of breath, etc. between now and your scheduled surgery, please notify us at the above number.   Remember:  Follow the diet instructions given to you by the office.               Your last dose of januvia should be on 07/28/2022.          Use your nebulizer and your inhaler before you come and bring your rescue inhaler with you.      Take these medicines the morning of surgery with A SIP OF WATER           cymbalta, pepcid, gabapentin metoprolol, zofran, oxycodone(if needed).     Do not wear jewelry, make-up or nail polish.  Do not wear lotions, powders, or perfumes, or deodorant.  Do not shave 48 hours prior to surgery.  Men may shave face and neck.  Do not bring valuables to the hospital.  [facility] is not responsible for any belongings or valuables.  Contacts, dentures or bridgework may not be worn into surgery.  Leave your suitcase in the car.  After surgery it may be brought to your room.  For patients admitted to the hospital, discharge time will be determined by your treatment team.  Patients discharged the day of surgery will not be allowed to drive home and must have someone with them for 24 hours.    Special instructions:   DO NOT smoke tobacco or vape for 24 hours before your procedure.  Please read over the following fact sheets that you were given. Anesthesia Post-op Instructions and Care and Recovery After Surgery      Upper Endoscopy, Adult, Care After After the procedure, it is common to have a sore throat. It is also common to have: Mild stomach pain or discomfort. Bloating. Nausea. Follow these instructions at home: The instructions below  may help you care for yourself at home. Your health care provider may give you more instructions. If you have questions, ask your health care provider. If you were given a sedative during the procedure, it can affect you for several hours. Do not drive or operate machinery until your health care provider says that it is safe. If you will be going home right after the procedure, plan to have a responsible adult: Take you home from the hospital or clinic. You will not be allowed to drive. Care for you for the time you are told. Follow instructions from your health care provider about what you may eat and drink. Return to your normal activities as told by your health care provider. Ask your health care provider what activities are safe for you. Take over-the-counter and prescription medicines only as told by your health care provider. Contact a health care provider if you: Have a sore throat that lasts longer than one day. Have trouble swallowing. Have a fever. Get help right away if you: Vomit blood or your vomit looks like coffee grounds. Have bloody, black, or tarry stools. Have a very bad sore throat or you cannot swallow. Have difficulty breathing or very bad pain in your chest or  abdomen. These symptoms may be an emergency. Get help right away. Call 911. Do not wait to see if the symptoms will go away. Do not drive yourself to the hospital. Summary After the procedure, it is common to have a sore throat, mild stomach discomfort, bloating, and nausea. If you were given a sedative during the procedure, it can affect you for several hours. Do not drive until your health care provider says that it is safe. Follow instructions from your health care provider about what you may eat and drink. Return to your normal activities as told by your health care provider. This information is not intended to replace advice given to you by your health care provider. Make sure you discuss any questions you  have with your health care provider. Document Revised: 06/29/2021 Document Reviewed: 06/29/2021 Elsevier Patient Education  2023 [employer] Esophageal Dilatation Esophageal dilatation, also called esophageal dilation, is a procedure to widen or open a blocked or narrowed part of the esophagus. The esophagus is the part of the body that moves food and liquid from the mouth to the stomach. You may need this procedure if: You have a buildup of scar tissue in your esophagus that makes it difficult, painful, or impossible to swallow. This can be caused by gastroesophageal reflux disease (GERD). You have cancer of the esophagus. There is a problem with how food moves through your esophagus. In some cases, you may need this procedure repeated at a later time to dilate the esophagus gradually. Tell a health care provider about: Any allergies you have. All medicines you are taking, including vitamins, herbs, eye drops, creams, and over-the-counter medicines. Any problems you or family members have had with anesthetic medicines. Any blood disorders you have. Any surgeries you have had. Any medical conditions you have. Any antibiotic medicines you are required to take before dental procedures. Whether you are pregnant or may be pregnant. What are the risks? Generally, this is a safe procedure. However, problems may occur, including: Bleeding due to a tear in the lining of the esophagus. A hole, or perforation, in the esophagus. What happens before the procedure? Ask your health care provider about: Changing or stopping your regular medicines. This is especially important if you are taking diabetes medicines or blood thinners. Taking medicines such as aspirin and ibuprofen. These medicines can thin your blood. Do not take these medicines unless your health care provider tells you to take them. Taking over-the-counter medicines, vitamins, herbs, and supplements. Follow instructions from your health  care provider about eating or drinking restrictions. Plan to have a responsible adult take you home from the hospital or clinic. Plan to have a responsible adult care for you for the time you are told after you leave the hospital or clinic. This is important. What happens during the procedure? You may be given a medicine to help you relax (sedative). A numbing medicine may be sprayed into the back of your throat, or you may gargle the medicine. Your health care provider may perform the dilatation using various surgical instruments, such as: Simple dilators. This instrument is carefully placed in the esophagus to stretch it. Guided wire bougies. This involves using an endoscope to insert a wire into the esophagus. A dilator is passed over this wire to enlarge the esophagus. Then the wire is removed. Balloon dilators. An endoscope with a small balloon is inserted into the esophagus. The balloon is inflated to stretch the esophagus and open it up. The procedure may vary among health  care providers and hospitals. What can I expect after the procedure? Your blood pressure, heart rate, breathing rate, and blood oxygen level will be monitored until you leave the hospital or clinic. Your throat may feel slightly sore and numb. This will get better over time. You will not be allowed to eat or drink until your throat is no longer numb. When you are able to drink, urinate, and sit on the edge of the bed without nausea or dizziness, you may be able to return home. Follow these instructions at home: Take over-the-counter and prescription medicines only as told by your health care provider. If you were given a sedative during the procedure, it can affect you for several hours. Do not drive or operate machinery until your health care provider says that it is safe. Plan to have a responsible adult care for you for the time you are told. This is important. Follow instructions from your health care provider about  any eating or drinking restrictions. Do not use any products that contain nicotine or tobacco, such as cigarettes, e-cigarettes, and chewing tobacco. If you need help quitting, ask your health care provider. Keep all follow-up visits. This is important. Contact a health care provider if: You have a fever. You have pain that is not relieved by medicine. Get help right away if: You have chest pain. You have trouble breathing. You have trouble swallowing. You vomit blood. You have black, tarry, or bloody stools. These symptoms may represent a serious problem that is an emergency. Do not wait to see if the symptoms will go away. Get medical help right away. Call your local emergency services (911 in the [place] Do not drive yourself to the hospital. Summary Esophageal dilatation, also called esophageal dilation, is a procedure to widen or open a blocked or narrowed part of the esophagus. Plan to have a responsible adult take you home from the hospital or clinic. For this procedure, a numbing medicine may be sprayed into the back of your throat, or you may gargle the medicine. Do not drive or operate machinery until your health care provider says that it is safe. This information is not intended to replace advice given to you by your health care provider. Make sure you discuss any questions you have with your health care provider. Document Revised: 08/06/2019 Document Reviewed: 08/06/2019 Elsevier Patient Education  2023 Elsevier Inc. Monitored Anesthesia Care, Care After The following information offers guidance on how to care for yourself after your procedure. Your health care provider may also give you more specific instructions. If you have problems or questions, contact your health care provider. What can I expect after the procedure? After the procedure, it is common to have: Tiredness. Little or no memory about what happened during or after the procedure. Impaired judgment when it comes  to making decisions. Nausea or vomiting. Some trouble with balance. Follow these instructions at home: For the time period you were told by your health care provider:  Rest. Do not participate in activities where you could fall or become injured. Do not drive or use machinery. Do not drink alcohol. Do not take sleeping pills or medicines that cause drowsiness. Do not make important decisions or sign legal documents. Do not take care of children on your own. Medicines Take over-the-counter and prescription medicines only as told by your health care provider. If you were prescribed antibiotics, take them as told by your health care provider. Do not stop using the antibiotic even if you start  to feel better. Eating and drinking Follow instructions from your health care provider about what you may eat and drink. Drink enough fluid to keep your urine pale yellow. If you vomit: Drink clear fluids slowly and in small amounts as you are able. Clear fluids include water, ice chips, low-calorie sports drinks, and fruit juice that has water added to it (diluted fruit juice). Eat light and bland foods in small amounts as you are able. These foods include bananas, applesauce, rice, lean meats, toast, and crackers. General instructions  Have a responsible adult stay with you for the time you are told. It is important to have someone help care for you until you are awake and alert. If you have sleep apnea, surgery and some medicines can increase your risk for breathing problems. Follow instructions from your health care provider about wearing your sleep device: When you are sleeping. This includes during daytime naps. While taking prescription pain medicines, sleeping medicines, or medicines that make you drowsy. Do not use any products that contain nicotine or tobacco. These products include cigarettes, chewing tobacco, and vaping devices, such as e-cigarettes. If you need help quitting, ask your  health care provider. Contact a health care provider if: You feel nauseous or vomit every time you eat or drink. You feel light-headed. You are still sleepy or having trouble with balance after 24 hours. You get a rash. You have a fever. You have redness or swelling around the IV site. Get help right away if: You have trouble breathing. You have new confusion after you get home. These symptoms may be an emergency. Get help right away. Call 911. Do not wait to see if the symptoms will go away. Do not drive yourself to the hospital. This information is not intended to replace advice given to you by your health care provider. Make sure you discuss any questions you have with your health care provider. Document Revised: 08/15/2021 Document Reviewed: 08/15/2021 Elsevier Patient Education  2023 [employer].

## 2022-07-31 ENCOUNTER — Encounter (HOSPITAL_COMMUNITY): Payer: Self-pay

## 2022-07-31 ENCOUNTER — Encounter (HOSPITAL_COMMUNITY)
Admission: RE | Admit: 2022-07-31 | Discharge: 2022-07-31 | Disposition: A | Discharge status: Home / Self Care | Payer: Medicare Other | Source: Ambulatory Visit | Attending: Internal Medicine | Admitting: Internal Medicine

## 2022-07-31 DIAGNOSIS — I10 Essential (primary) hypertension: Secondary | ICD-10-CM

## 2022-07-31 HISTORY — DX: Pure hypercholesterolemia, unspecified: E78.00

## 2022-07-31 HISTORY — DX: Type 2 diabetes mellitus without complications: E11.9

## 2022-07-31 HISTORY — DX: Acute myocardial infarction, unspecified: I21.9

## 2022-07-31 HISTORY — DX: Chronic kidney disease, unspecified: N18.9

## 2022-08-01 ENCOUNTER — Encounter (HOSPITAL_COMMUNITY): Payer: Self-pay

## 2022-08-02 ENCOUNTER — Telehealth: Payer: Self-pay

## 2022-08-02 ENCOUNTER — Ambulatory Visit (HOSPITAL_COMMUNITY): Payer: Medicare Other | Admitting: Certified Registered Nurse Anesthetist

## 2022-08-02 ENCOUNTER — Encounter (HOSPITAL_COMMUNITY)
Admission: RE | Disposition: A | Discharge status: Skilled Nursing Facility | Payer: Self-pay | Source: Home / Self Care | Attending: Internal Medicine

## 2022-08-02 ENCOUNTER — Ambulatory Visit (HOSPITAL_COMMUNITY)
Admission: RE | Admit: 2022-08-02 | Discharge: 2022-08-02 | Disposition: A | Discharge status: Skilled Nursing Facility | Payer: Medicare Other | Attending: Internal Medicine | Admitting: Internal Medicine

## 2022-08-02 ENCOUNTER — Ambulatory Visit (HOSPITAL_BASED_OUTPATIENT_CLINIC_OR_DEPARTMENT_OTHER): Payer: Medicare Other | Admitting: Certified Registered Nurse Anesthetist

## 2022-08-02 ENCOUNTER — Encounter (HOSPITAL_COMMUNITY): Payer: Self-pay | Admitting: Internal Medicine

## 2022-08-02 DIAGNOSIS — I2510 Atherosclerotic heart disease of native coronary artery without angina pectoris: Secondary | ICD-10-CM | POA: Diagnosis not present

## 2022-08-02 DIAGNOSIS — K449 Diaphragmatic hernia without obstruction or gangrene: Secondary | ICD-10-CM | POA: Diagnosis not present

## 2022-08-02 DIAGNOSIS — I509 Heart failure, unspecified: Secondary | ICD-10-CM

## 2022-08-02 DIAGNOSIS — I110 Hypertensive heart disease with heart failure: Secondary | ICD-10-CM | POA: Diagnosis not present

## 2022-08-02 DIAGNOSIS — K222 Esophageal obstruction: Secondary | ICD-10-CM | POA: Insufficient documentation

## 2022-08-02 DIAGNOSIS — J449 Chronic obstructive pulmonary disease, unspecified: Secondary | ICD-10-CM

## 2022-08-02 DIAGNOSIS — R1310 Dysphagia, unspecified: Secondary | ICD-10-CM | POA: Diagnosis not present

## 2022-08-02 DIAGNOSIS — Z800 Family history of malignant neoplasm of digestive organs: Secondary | ICD-10-CM | POA: Insufficient documentation

## 2022-08-02 DIAGNOSIS — Z87891 Personal history of nicotine dependence: Secondary | ICD-10-CM

## 2022-08-02 DIAGNOSIS — I10 Essential (primary) hypertension: Secondary | ICD-10-CM

## 2022-08-02 HISTORY — PX: MALONEY DILATION: SHX5535

## 2022-08-02 HISTORY — PX: ESOPHAGOGASTRODUODENOSCOPY (EGD) WITH PROPOFOL: SHX5813

## 2022-08-02 LAB — GLUCOSE, CAPILLARY
Glucose-Capillary: 73 mg/dL (ref 70–99)
Glucose-Capillary: 121 mg/dL — ABNORMAL HIGH (ref 70–99)
Glucose-Capillary: 96 mg/dL (ref 70–99)

## 2022-08-02 SURGERY — ESOPHAGOGASTRODUODENOSCOPY (EGD) WITH PROPOFOL
Anesthesia: General

## 2022-08-02 MED ORDER — STERILE WATER FOR IRRIGATION IR SOLN
Status: DC | PRN
  Administered 2022-08-02: 50 mL

## 2022-08-02 MED ORDER — PROPOFOL 500 MG/50ML IV EMUL
INTRAVENOUS | Status: AC
  Filled 2022-08-02: qty 50

## 2022-08-02 MED ORDER — DEXTROSE 50 % IV SOLN
INTRAVENOUS | Status: AC
  Administered 2022-08-02: 25 mL via INTRAVENOUS
  Filled 2022-08-02: qty 50

## 2022-08-02 MED ORDER — LACTATED RINGERS IV SOLN: INTRAVENOUS | Status: DC

## 2022-08-02 MED ORDER — PROPOFOL 10 MG/ML IV BOLUS
INTRAVENOUS | Status: DC | PRN
  Administered 2022-08-02: 60 mg via INTRAVENOUS

## 2022-08-02 MED ORDER — LIDOCAINE 2% (20 MG/ML) 5 ML SYRINGE
INTRAMUSCULAR | Status: DC | PRN
  Administered 2022-08-02: 50 mg via INTRAVENOUS

## 2022-08-02 MED ORDER — DEXTROSE 50 % IV SOLN: 25.0000 mL | Freq: Once | INTRAVENOUS | Status: AC

## 2022-08-02 MED ORDER — PANTOPRAZOLE SODIUM 40 MG PO TBEC: 40.0000 mg | DELAYED_RELEASE_TABLET | Freq: Every day | ORAL | 11 refills | Status: DC

## 2022-08-02 MED ORDER — PROPOFOL 500 MG/50ML IV EMUL
INTRAVENOUS | Status: DC | PRN
  Administered 2022-08-02: 180 ug/kg/min via INTRAVENOUS

## 2022-08-02 NOTE — Telephone Encounter (Signed)
-----   Message from Corbin Ade, MD sent at 08/02/2022 10:30 AM EDT -----   Needs new prescription.  Protonix 40 mg pill dispense 30 with 11 refills.  Take 40 mg once daily 30 minutes  BEFORE SUPPER

## 2022-08-02 NOTE — Telephone Encounter (Signed)
Rx sent to pharmacy on file.

## 2022-08-02 NOTE — Anesthesia Preprocedure Evaluation (Signed)
Anesthesia Evaluation  Patient identified by MRN, date of birth, ID band Patient awake    Reviewed: Allergy & Precautions, H&P , NPO status , Patient's Chart, lab work & pertinent test results, reviewed documented beta blocker date and time   Airway Mallampati: II  TM Distance: >3 FB Neck ROM: full    Dental no notable dental hx.    Pulmonary neg pulmonary ROS, pneumonia, COPD, former smoker   Pulmonary exam normal breath sounds clear to auscultation       Cardiovascular Exercise Tolerance: Good hypertension, + CAD, + Past MI and +CHF  negative cardio ROS  Rhythm:regular Rate:Normal     Neuro/Psych  Headaches PSYCHIATRIC DISORDERS Anxiety Depression    CVA negative neurological ROS  negative psych ROS   GI/Hepatic negative GI ROS, Neg liver ROS,GERD  ,,  Endo/Other  negative endocrine ROSdiabetes    Renal/GU Renal diseasenegative Renal ROS  negative genitourinary   Musculoskeletal   Abdominal   Peds  Hematology negative hematology ROS (+)   Anesthesia Other Findings   Reproductive/Obstetrics negative OB ROS                             Anesthesia Physical Anesthesia Plan  ASA: 3  Anesthesia Plan: General   Post-op Pain Management:    Induction:   PONV Risk Score and Plan: Propofol infusion  Airway Management Planned:   Additional Equipment:   Intra-op Plan:   Post-operative Plan:   Informed Consent: I have reviewed the patients History and Physical, chart, labs and discussed the procedure including the risks, benefits and alternatives for the proposed anesthesia with the patient or authorized representative who has indicated his/her understanding and acceptance.     Dental Advisory Given  Plan Discussed with: CRNA  Anesthesia Plan Comments:        Anesthesia Quick Evaluation

## 2022-08-02 NOTE — Op Note (Signed)
[facility] Patient Name: Mariah White Procedure Date: 08/02/2022 9:44 AM MRN: [id number] Date of Birth: November 30, 1950 Attending MD: Gennette Pac , MD, [id number] CSN: [id number] Age: 72 Admit Type: Outpatient Procedure:                Upper GI endoscopy Indications:              Dysphagia Providers:                Gennette Pac, MD, Buel Ream. Thomasena Edis RN, RN,                            Lennice Sites Technician, Technician Referring MD:              Medicines:                Propofol per Anesthesia Complications:            No immediate complications. Estimated Blood Loss:     Estimated blood loss was minimal. Procedure:                Pre-Anesthesia Assessment:                           - Prior to the procedure, a History and Physical                            was performed, and patient medications and                            allergies were reviewed. The patient's tolerance of                            previous anesthesia was also reviewed. The risks                            and benefits of the procedure and the sedation                            options and risks were discussed with the patient.                            All questions were answered, and informed consent                            was obtained. Prior Anticoagulants: The patient has                            taken no anticoagulant or antiplatelet agents. ASA                            Grade Assessment: III - A patient with severe                            systemic disease. After reviewing the risks and  benefits, the patient was deemed in satisfactory                            condition to undergo the procedure.                           After obtaining informed consent, the endoscope was                            passed under direct vision. Throughout the                            procedure, the patient's blood pressure, pulse, and                             oxygen saturations were monitored continuously. The                            GIF-H190 ([id number]) scope was introduced through the                            mouth, and advanced to the second part of duodenum.                            The upper GI endoscopy was accomplished without                            difficulty. The patient tolerated the procedure                            well. Scope In: 10:03:35 AM Scope Out: 10:12:17 AM Total Procedure Duration: 0 hours 8 minutes 42 seconds  Findings:      A moderate Schatzki ring was found at the gastroesophageal junction. No       esophagitis no tumor. No Barrett's epithelium. Stomach empty.      A small hiatal hernia was present. Normal-appearing gastric mucosa.       Patent pylorus.      The duodenal bulb and second portion of the duodenum were normal.       Biopsies were taken with a cold forceps for histology. The scope was       withdrawn. Dilation was performed with a Maloney dilator with no       resistance at 54 Fr. The dilation site was examined following endoscope       reinsertion and showed no change. Estimated blood loss was minimal. The       scope was withdrawn. Dilation was performed with a Maloney dilator with       mild resistance at 56 Fr. The dilation site was examined following       endoscope reinsertion and showed moderate mucosal disruption. Estimated       blood loss was minimal. Impression:               - Moderate Schatzki ring. Dilated as described                           -  Small hiatal hernia.                           - Normal duodenal bulb and second portion of the                            duodenum. Biopsied. Moderate Sedation:      Moderate (conscious) sedation was personally administered by an       anesthesia professional. The following parameters were monitored: oxygen       saturation, heart rate, blood pressure, respiratory rate, EKG, adequacy       of pulmonary ventilation, and response to  care. Recommendation:           - Patient has a contact number available for                            emergencies. The signs and symptoms of potential                            delayed complications were discussed with the                            patient. Return to normal activities tomorrow.                            Written discharge instructions were provided to the                            patient.                           - Advance diet as tolerated. Begin Protonix 40 mg                            once daily 30 minutes before supper. New                            prescription provided through the office. Procedure Code(s):        --- Professional ---                           [phone], Esophagogastroduodenoscopy, flexible,                            transoral; with biopsy, single or multiple                           43450, Dilation of esophagus, by unguided sound or                            bougie, single or multiple passes Diagnosis Code(s):        --- Professional ---                           K22.2, Esophageal obstruction  K44.9, Diaphragmatic hernia without obstruction or                            gangrene                           R13.10, Dysphagia, unspecified CPT copyright 2022 [employer] Association. All rights reserved. The codes documented in this report are preliminary and upon coder review may  be revised to meet current compliance requirements. Gerrit Friends. Lavelle Akel, MD Gennette Pac, MD 08/02/2022 16:10:96 AM This report has been signed electronically. Number of Addenda: 0

## 2022-08-02 NOTE — Interval H&P Note (Signed)
History and Physical Interval Note:  08/02/2022 9:53 AM  Mariah White  has presented today for surgery, with the diagnosis of dysphagia.  The various methods of treatment have been discussed with the patient and family. After consideration of risks, benefits and other options for treatment, the patient has consented to  Procedure(s) with comments: ESOPHAGOGASTRODUODENOSCOPY (EGD) WITH PROPOFOL (N/A) - 10:00am;ASA 3 MALONEY DILATION (N/A) - 10:00am;ASA 3 as a surgical intervention.  The patient's history has been reviewed, patient examined, no change in status, stable for surgery.  I have reviewed the patient's chart and labs.  Questions were answered to the patient's satisfaction.     Mariah White   no change.  EGD with esophageal dilation as feasible/appropriate per plan.  The risks, benefits, limitations, alternatives and imponderables have been reviewed with the patient. Potential for esophageal dilation, biopsy, etc. have also been reviewed.  Questions have been answered. All parties agreeable.

## 2022-08-02 NOTE — Discharge Instructions (Signed)
EGD Discharge instructions Please read the instructions outlined below and refer to this sheet in the next few weeks. These discharge instructions provide you with general information on caring for yourself after you leave the hospital. Your doctor may also give you specific instructions. While your treatment has been planned according to the most current medical practices available, unavoidable complications occasionally occur. If you have any problems or questions after discharge, please call your doctor. ACTIVITY You may resume your regular activity but move at a slower pace for the next 24 hours.  Take frequent rest periods for the next 24 hours.  Walking will help expel (get rid of) the air and reduce the bloated feeling in your abdomen.  No driving for 24 hours (because of the anesthesia (medicine) used during the test).  You may shower.  Do not sign any important legal documents or operate any machinery for 24 hours (because of the anesthesia used during the test).  NUTRITION Drink plenty of fluids.  You may resume your normal diet.  Begin with a light meal and progress to your normal diet.  Avoid alcoholic beverages for 24 hours or as instructed by your caregiver.  MEDICATIONS You may resume your normal medications unless your caregiver tells you otherwise.  WHAT YOU CAN EXPECT TODAY You may experience abdominal discomfort such as a feeling of fullness or "gas" pains.  FOLLOW-UP Your doctor will discuss the results of your test with you.  SEEK IMMEDIATE MEDICAL ATTENTION IF ANY OF THE FOLLOWING OCCUR: Excessive nausea (feeling sick to your stomach) and/or vomiting.  Severe abdominal pain and distention (swelling).  Trouble swallowing.  Temperature over 101 F (37.8 C).  Rectal bleeding or vomiting of blood.       Schatzki's ring dilated today.  This is a benign lesion from acid reflux   need to start on Protonix 40 mg pill taken daily 30 minutes before supper.  New  prescription  called in through my office.    Return to the office as needed.    At patient request, called Donivan Scull at [phone] rolled to voicemail.  Left a message.

## 2022-08-02 NOTE — Transfer of Care (Signed)
Immediate Anesthesia Transfer of Care Note  Patient: Mariah White  Procedure(s) Performed: ESOPHAGOGASTRODUODENOSCOPY (EGD) WITH PROPOFOL MALONEY DILATION  Patient Location: Short Stay  Anesthesia Type:General  Level of Consciousness: drowsy, patient cooperative, and responds to stimulation  Airway & Oxygen Therapy: Patient Spontanous Breathing and Patient connected to nasal cannula oxygen  Post-op Assessment: Report given to RN, Post -op Vital signs reviewed and stable, Patient moving all extremities X 4, and Patient able to stick tongue midline  Post vital signs: Reviewed  Last Vitals:  Vitals Value Taken Time  BP 93/49   Temp 97.6   Pulse 86   Resp 18   SpO2 98     Last Pain:  Vitals:   08/02/22 0959  TempSrc:   PainSc: 0-No pain         Complications: No notable events documented.

## 2022-08-04 NOTE — Anesthesia Postprocedure Evaluation (Signed)
Anesthesia Post Note  Patient: Kobie A Deoliveira  Procedure(s) Performed: ESOPHAGOGASTRODUODENOSCOPY (EGD) WITH PROPOFOL MALONEY DILATION  Patient location during evaluation: Phase II Anesthesia Type: General Level of consciousness: awake Pain management: pain level controlled Vital Signs Assessment: post-procedure vital signs reviewed and stable Respiratory status: spontaneous breathing and respiratory function stable Cardiovascular status: blood pressure returned to baseline and stable Postop Assessment: no headache and no apparent nausea or vomiting Anesthetic complications: no Comments: Late entry   No notable events documented.   Last Vitals:  Vitals:   08/02/22 1029 08/02/22 1030  BP: 95/65 101/64  Pulse:    Resp:    Temp:    SpO2:  98%    Last Pain:  Vitals:   08/02/22 1027  TempSrc:   PainSc: 0-No pain                 Windell Norfolk

## 2022-08-09 ENCOUNTER — Encounter (HOSPITAL_COMMUNITY): Payer: Self-pay | Admitting: Internal Medicine

## 2022-10-09 NOTE — Progress Notes (Unsigned)
GI Office Note    Referring Provider: Charlynne Pander, MD Primary Care Physician:  Charlynne Pander, MD Primary Gastroenterologist: Gerrit Friends.Rourk, MD  Date:  10/10/2022  ID:  Mariah White, DOB 1950-09-22, MRN [id number]   Chief Complaint   Chief Complaint  Patient presents with   Follow-up    Follow up    History of Present Illness  Mariah White is a 72 y.o. female with a history of anxiety, COPD, CAD, depression, GERD, HTN, migraines, stroke, and CHF presenting today with complaint of dysphagia presenting today for follow-up.  Office visit 07/11/22. Having issues with dysphagia - solid and medications. Dysphagia precautions reviewed. Scheduled for EGD/ED. Increase famotidine to BID. Continue imodium as needed. Stop senna. Check labs.   EGD 08/02/22: - moderate Schatzki ring s/p dilation - Small hiatal hernia.  -Start pantoprazole 40 daily before dinner.  Today:  Swallowing improves since EGD.   Has some lower abdominal pain but not really any upper abdominal pain. Has been having some diarrhea and then she has burning that occurs in the groin because her stool comes up the front.  She states she also has burning with urination. She states she takes a brown pill that starts with a "C" that helps with the abdominal pain. She says her nurse gives her that medication to help with kidney spasms (likely cranberry tablets).  She denies any melena or brbpr.   She states she has been having "right many" loose stools daily, likely 4-6 daily sometimes more. Denies any sickness or fevers. She has had some intermittent nausea. No shortness of breath. Has some rare chest pain.   She thinks she recently had a UTI recently and was given antibiotics.   Per review of chart patient had fluconazole prescribed in late May/early June.  Had received levofloxacin in January/February and nitrofurantoin in April.  Also received combined nystatin/triamcinolone cream in June.  Current Outpatient Medications   Medication Sig Dispense Refill   acetaminophen (TYLENOL) 650 MG CR tablet Take 650 mg by mouth every 4 (four) hours as needed for pain.     ACIDOPHILUS LACTOBACILLUS PO Take 1 tablet by mouth 2 (two) times daily.     ascorbic acid (VITAMIN C) 500 MG tablet Take 500 mg by mouth daily.     atorvastatin (LIPITOR) 40 MG tablet Take 40 mg by mouth daily.     cholecalciferol (VITAMIN D3) 25 MCG (1000 UNIT) tablet Take 2,000 Units by mouth daily.     clopidogrel (PLAVIX) 75 MG tablet Take 75 mg by mouth in the morning.     Cranberry 450 MG TABS Take 450 mg by mouth 2 (two) times daily.     DULoxetine (CYMBALTA) 60 MG capsule Take 1 capsule (60 mg total) by mouth daily. 30 capsule 3   famotidine (PEPCID) 10 MG tablet Take 20 mg by mouth 2 (two) times daily.     gabapentin (NEURONTIN) 100 MG capsule Take 200 mg by mouth 2 (two) times daily.     INCRUSE ELLIPTA 62.5 MCG/ACT AEPB Inhale 1 puff into the lungs daily.     loperamide (IMODIUM) 2 MG capsule Take 2 mg by mouth as needed for diarrhea or loose stools. Take 2 tablets after the first loose stool, then 1 tablet after subsequent loose stools, 4 tablets max in 24 hours.     Menthol, Topical Analgesic, (BIOFREEZE) 4 % GEL Apply 1 Application topically every 8 (eight) hours as needed (pain).     metoprolol succinate (TOPROL-XL) 25  MG 24 hr tablet Take 25 mg by mouth daily.     Multiple Vitamin (MULTIVITAMIN WITH MINERALS) TABS tablet Take 1 tablet by mouth daily.     Nutritional Supplements (PROMOD PO) Take 30 mLs by mouth in the morning and at bedtime.     ondansetron (ZOFRAN) 4 MG tablet Take 4 mg by mouth every 8 (eight) hours as needed for nausea or vomiting.     oxyCODONE-acetaminophen (PERCOCET/ROXICET) 5-325 MG tablet Take 1 tablet by mouth every 12 (twelve) hours as needed for severe pain.     pantoprazole (PROTONIX) 40 MG tablet Take 1 tablet (40 mg total) by mouth daily. 30 tablet 11   phenazopyridine (PYRIDIUM) 100 MG tablet Take 100 mg by  mouth every 8 (eight) hours as needed for pain.     polyvinyl alcohol (LIQUIFILM TEARS) 1.4 % ophthalmic solution Place 2 drops into both eyes in the morning, at noon, and at bedtime.     senna (SENOKOT) 8.6 MG TABS tablet Take 2 tablets by mouth at bedtime.     sitaGLIPtin (JANUVIA) 100 MG tablet Take 100 mg by mouth in the morning.     zinc gluconate 50 MG tablet Take 50 mg by mouth daily.     ARIPiprazole (ABILIFY) 5 MG tablet Take 1 tablet (5 mg total) by mouth at bedtime. 90 tablet 2   ipratropium-albuterol (DUONEB) 0.5-2.5 (3) MG/3ML SOLN Take 3 mLs by nebulization every 6 (six) hours as needed (cough/congestion). (Patient not taking: Reported on 10/10/2022)     OXYGEN Inhale 2 L/min into the lungs as needed (for shortness of breath). (Patient not taking: Reported on 10/10/2022)     No current facility-administered medications for this visit.    Past Medical History:  Diagnosis Date   Anxiety    Arthritis    CHF (congestive heart failure) (HCC)    Chronic back pain    Chronic kidney disease    Chronic pain    COPD (chronic obstructive pulmonary disease) (HCC)    Coronary artery disease    Depression    Diabetes mellitus without complication (HCC)    GERD (gastroesophageal reflux disease)    High cholesterol    Hypertension    Migraine    Myocardial infarction (HCC)    Stroke [facility])     Past Surgical History:  Procedure Laterality Date   ABDOMINAL HYSTERECTOMY     breast lump removed     BREAST SURGERY     CHOLECYSTECTOMY     ESOPHAGOGASTRODUODENOSCOPY (EGD) WITH PROPOFOL N/A 08/02/2022   Procedure: ESOPHAGOGASTRODUODENOSCOPY (EGD) WITH PROPOFOL;  Surgeon: Corbin Ade, MD;  Location: [facility];  Service: Endoscopy;  Laterality: N/A;  10:00am;ASA 3   FOOT SURGERY     LEFT HEART CATH AND CORONARY ANGIOGRAPHY N/A 08/16/2020   Procedure: LEFT HEART CATH AND CORONARY ANGIOGRAPHY;  Surgeon: Yvonne Kendall, MD;  Location: [facility] INVASIVE CV LAB;  Service: Cardiovascular;   Laterality: N/A;   MALONEY DILATION N/A 08/02/2022   Procedure: Elease Hashimoto DILATION;  Surgeon: Corbin Ade, MD;  Location: [facility];  Service: Endoscopy;  Laterality: N/A;  10:00am;ASA 3    Family History  Problem Relation Age of Onset   Bipolar disorder Daughter    Diabetes Mother    Stroke Father    Diabetes Sister    Diabetes Brother    Diabetes Brother    Colon cancer Sister    Colon cancer Sister    Pancreatic cancer Brother    Heart attack Brother  Allergies as of 10/10/2022 - Review Complete 10/10/2022  Allergen Reaction Noted   Tape Other (See Comments) 08/14/2020   Penicillins Itching 07/01/2014    Social History   Socioeconomic History   Marital status: Single    Spouse name: Not on file   Number of children: Not on file   Years of education: Not on file   Highest education level: Not on file  Occupational History   Not on file  Tobacco Use   Smoking status: Former    Packs/day: 1.00    Years: 50.00    Additional pack years: 0.00    Total pack years: 50.00    Types: Cigarettes    Start date: 01/18/1959    Passive exposure: Past   Smokeless tobacco: Never   Tobacco comments:    1 pack per day   Vaping Use   Vaping Use: Never used  Substance and Sexual Activity   Alcohol use: No   Drug use: No   Sexual activity: Not Currently  Other Topics Concern   Not on file  Social History Narrative   Not on file   Social Determinants of Health   Financial Resource Strain: Not on file  Food Insecurity: Not on file  Transportation Needs: Not on file  Physical Activity: Not on file  Stress: Not on file  Social Connections: Not on file     Review of Systems   Gen: Denies fever, chills, anorexia. Denies fatigue, weakness, weight loss.  CV: Denies chest pain, palpitations, syncope, peripheral edema, and claudication. Resp: Denies dyspnea at rest, cough, wheezing, coughing up blood, and pleurisy. GI: See HPI Derm: Denies rash, itching, dry  skin Psych: Denies depression, anxiety, memory loss, confusion. No homicidal or suicidal ideation.  Heme: Denies bruising, bleeding, and enlarged lymph nodes.   Physical Exam   BP 106/67 (BP Location: Right Arm, Patient Position: Sitting, Cuff Size: Normal)   Pulse 69   Temp 97.6 F (36.4 C) (Temporal)   Wt 114 lb (51.7 kg)   SpO2 93%   BMI 20.85 kg/m   General:   Alert and oriented. No distress noted. Pleasant and cooperative.  Head:  Normocephalic and atraumatic. Eyes:  Conjuctiva clear without scleral icterus. Mouth:  Oral mucosa pink and moist. Good dentition. No lesions. Lungs:  Clear to auscultation bilaterally. No wheezes, rales, or rhonchi. No distress.  Heart:  S1, S2 present without murmurs appreciated.  Abdomen:  +BS, soft, non-tender and non-distended. No rebound or guarding. No HSM or masses noted. Rectal: deferred Skin: observed groin bilaterally and she has had some mild erythema noted but with presence of white cream that appears to be acting as a barrier.  Msk:  Symmetrical without gross deformities. Normal posture. Extremities:  Without edema. Neurologic:  Alert and  oriented x4 Psych:  Alert and cooperative. Normal mood and affect.   Assessment  Mariah White is a 72 y.o. female with a history of anxiety, COPD, CAD, depression, GERD, HTN, migraines, stroke, and CHF presenting today for follow-up  GERD, dysphagia: Dysphagia resolved since EGD with dilation.  Denies any current issues with acid reflux.  Has experienced some rare nausea at times but overall very well-controlled on once daily pantoprazole.  Intermittent Diarrhea, lower abdominal pain: She has been experiencing intermittent diarrhea since prior to December 2023.  At that time she was having about 3 bowel movements per day and taking Imodium as needed.  During that time she was also recently being treated for UTI and receiving  antibiotics.  Per review of her recent medications it also appears that  patient has received multiple different antibiotics over the last several months presumably for UTIs and has also been treated for a fungal infection with Diflucan.  Currently she is having anywhere between 4-6 bowel movements that are loose in nature daily although per her med list it appears that she is continued on Senokot nightly given history of constipation.  For now we will stop Senokot and check labs and she can continue to use Imodium as needed.  If diarrhea does not improve with stopping softener/laxative we may consider stool studies including fecal elastase.  For now she will continue on daily acidophilus.  For her lower abdominal pain I suspect this is related to her bowel habits however given this has been occurring for a while we will evaluate with a CT scan.  PLAN   CT A/P with contrast CBC, CMP, TSH Continue pantoprazole 40 mg once daily Continue to use nystatin/triamcinolone cream to groin area as needed Change senna from nightly to as needed if no bowel movement in 24 hours. May continue to give Imodium as needed for more than 3 loose stools per day Will consider fecal elastase or other stool studies if diarrhea continues despite stopping Senokot. Continue acidophilus daily. Follow-up in 2 months    Brooke Bonito, MSN, FNP-BC, AGACNP-BC [facility]

## 2022-10-10 ENCOUNTER — Telehealth: Payer: Self-pay | Admitting: *Deleted

## 2022-10-10 ENCOUNTER — Encounter: Payer: Self-pay | Admitting: Gastroenterology

## 2022-10-10 ENCOUNTER — Ambulatory Visit: Payer: Medicare Other | Admitting: Gastroenterology

## 2022-10-10 VITALS — BP 106/67 | HR 69 | Temp 97.6°F | Wt 114.0 lb

## 2022-10-10 DIAGNOSIS — R1030 Lower abdominal pain, unspecified: Secondary | ICD-10-CM

## 2022-10-10 DIAGNOSIS — R1319 Other dysphagia: Secondary | ICD-10-CM

## 2022-10-10 DIAGNOSIS — K219 Gastro-esophageal reflux disease without esophagitis: Secondary | ICD-10-CM

## 2022-10-10 DIAGNOSIS — R197 Diarrhea, unspecified: Secondary | ICD-10-CM

## 2022-10-10 NOTE — Patient Instructions (Signed)
Continue once daily pantoprazole.  For diarrhea: Change Senokot from nightly to as needed if no bowel movement in 24 hours May continue to give Imodium as needed more than 3 loose bowel movements daily  She has been complaining of lower abdominal pain since her last visit and has not improved therefore we will evaluate with a CT scan of the abdomen pelvis with contrast.  Our office will work on scheduling this and notify facility of date and time.  I am ordering some blood work for the patient to have completed as well.  If diarrhea continues despite stopping Senokot then I would recommend stool studies and checking a fecal elastase.  Will plan to follow-up in 2 months.  It was a pleasure to see you today. I want to create trusting relationships with patients. If you receive a survey regarding your visit,  I greatly appreciate you taking time to fill this out on paper or through your MyChart. I value your feedback.  Brooke Bonito, MSN, FNP-BC, AGACNP-BC [facility]

## 2022-10-10 NOTE — Telephone Encounter (Signed)
Called facility and was transferred to scheduler. No answer.  CT scheduled for 7/22, arrival 10:15am to start drinking the oral contrast x 2 hrs before scan starts, npo 4 hrs prior to arrival

## 2022-10-11 NOTE — Telephone Encounter (Signed)
Amy Freida Busman at [facility] called and is aware of CT appt details. Nothing further needed

## 2022-10-19 ENCOUNTER — Ambulatory Visit (INDEPENDENT_AMBULATORY_CARE_PROVIDER_SITE_OTHER): Payer: Medicare Other | Admitting: Obstetrics & Gynecology

## 2022-10-19 ENCOUNTER — Encounter: Payer: Self-pay | Admitting: Obstetrics & Gynecology

## 2022-10-19 VITALS — BP 118/67 | HR 68

## 2022-10-19 DIAGNOSIS — N763 Subacute and chronic vulvitis: Secondary | ICD-10-CM | POA: Diagnosis not present

## 2022-10-19 DIAGNOSIS — N823 Fistula of vagina to large intestine: Secondary | ICD-10-CM

## 2022-10-19 MED ORDER — GERHARDT'S BUTT CREAM: TOPICAL_CREAM | CUTANEOUS | Status: DC | PRN

## 2022-10-19 NOTE — Progress Notes (Signed)
Chief Complaint  Patient presents with   Gynecologic Exam    Pain in vaginal area,       72 y.o. G2P2 No LMP recorded. Patient has had a hysterectomy. The current method of family planning is status post hysterectomy.  Outpatient Encounter Medications as of 10/19/2022  Medication Sig   acetaminophen (TYLENOL) 650 MG CR tablet Take 650 mg by mouth every 4 (four) hours as needed for pain.   ACIDOPHILUS LACTOBACILLUS PO Take 1 tablet by mouth 2 (two) times daily.   ARIPiprazole (ABILIFY) 5 MG tablet Take 1 tablet (5 mg total) by mouth at bedtime.   ascorbic acid (VITAMIN C) 500 MG tablet Take 500 mg by mouth daily.   atorvastatin (LIPITOR) 40 MG tablet Take 40 mg by mouth daily.   cholecalciferol (VITAMIN D3) 25 MCG (1000 UNIT) tablet Take 2,000 Units by mouth daily.   clopidogrel (PLAVIX) 75 MG tablet Take 75 mg by mouth in the morning.   Cranberry 450 MG TABS Take 450 mg by mouth 2 (two) times daily.   DULoxetine (CYMBALTA) 60 MG capsule Take 1 capsule (60 mg total) by mouth daily.   famotidine (PEPCID) 10 MG tablet Take 20 mg by mouth 2 (two) times daily.   ferrous sulfate 324 MG TBEC Take 324 mg by mouth.   gabapentin (NEURONTIN) 100 MG capsule Take 200 mg by mouth 2 (two) times daily.   INCRUSE ELLIPTA 62.5 MCG/ACT AEPB Inhale 1 puff into the lungs daily.   ipratropium-albuterol (DUONEB) 0.5-2.5 (3) MG/3ML SOLN Take 3 mLs by nebulization every 6 (six) hours as needed (cough/congestion).   loperamide (IMODIUM) 2 MG capsule Take 2 mg by mouth as needed for diarrhea or loose stools. Take 2 tablets after the first loose stool, then 1 tablet after subsequent loose stools, 4 tablets max in 24 hours.   Menthol, Topical Analgesic, (BIOFREEZE) 4 % GEL Apply 1 Application topically every 8 (eight) hours as needed (pain).   metoprolol succinate (TOPROL-XL) 25 MG 24 hr tablet Take 25 mg by mouth daily.   Multiple Vitamin (MULTIVITAMIN WITH MINERALS) TABS tablet Take 1 tablet by mouth  daily.   Nutritional Supplements (PROMOD PO) Take 30 mLs by mouth in the morning and at bedtime.   ondansetron (ZOFRAN) 4 MG tablet Take 4 mg by mouth every 8 (eight) hours as needed for nausea or vomiting.   oxyCODONE-acetaminophen (PERCOCET/ROXICET) 5-325 MG tablet Take 1 tablet by mouth every 12 (twelve) hours as needed for severe pain.   pantoprazole (PROTONIX) 40 MG tablet Take 1 tablet (40 mg total) by mouth daily.   phenazopyridine (PYRIDIUM) 100 MG tablet Take 100 mg by mouth every 8 (eight) hours as needed for pain.   polyvinyl alcohol (LIQUIFILM TEARS) 1.4 % ophthalmic solution Place 2 drops into both eyes in the morning, at noon, and at bedtime.   senna (SENOKOT) 8.6 MG TABS tablet Take 2 tablets by mouth at bedtime.   sitaGLIPtin (JANUVIA) 100 MG tablet Take 100 mg by mouth in the morning.   zinc gluconate 50 MG tablet Take 50 mg by mouth daily.   OXYGEN Inhale 2 L/min into the lungs as needed (for shortness of breath). (Patient not taking: Reported on 10/19/2022)   Facility-Administered Encounter Medications as of 10/19/2022  Medication   Gerhardt's butt cream    Subjective Pt is complaining of vaginal pain for about 6 months She is in a [facility] in [place] She has a pad constantly and voids and  has BM into the pad  Past Medical History:  Diagnosis Date   Anxiety    Arthritis    CHF (congestive heart failure) (HCC)    Chronic back pain    Chronic kidney disease    Chronic pain    COPD (chronic obstructive pulmonary disease) (HCC)    Coronary artery disease    Depression    Diabetes mellitus without complication (HCC)    GERD (gastroesophageal reflux disease)    High cholesterol    Hypertension    Migraine    Myocardial infarction (HCC)    Stroke [facility])     Past Surgical History:  Procedure Laterality Date   ABDOMINAL HYSTERECTOMY     breast lump removed     BREAST SURGERY     CHOLECYSTECTOMY     ESOPHAGOGASTRODUODENOSCOPY (EGD) WITH PROPOFOL N/A 08/02/2022    Procedure: ESOPHAGOGASTRODUODENOSCOPY (EGD) WITH PROPOFOL;  Surgeon: Corbin Ade, MD;  Location: [facility];  Service: Endoscopy;  Laterality: N/A;  10:00am;ASA 3   FOOT SURGERY     LEFT HEART CATH AND CORONARY ANGIOGRAPHY N/A 08/16/2020   Procedure: LEFT HEART CATH AND CORONARY ANGIOGRAPHY;  Surgeon: Yvonne Kendall, MD;  Location: [facility] INVAS[facility] CV [facility];  Service: Cardiovascular;  Laterality: N/A;   MALONEY DILATION N/A 08/02/2022   Procedure: Elease Hashimoto DILATION;  Surgeon: Corbin Ade, MD;  Location: [facility];  Service: Endoscopy;  Laterality: N/A;  10:00am;ASA 3    OB History     Gravida  2   Para  2   Term      Preterm      AB      Living  2      SAB      IAB      Ectopic      Multiple      Live Births              Allergies  Allergen Reactions   Tape Other (See Comments)    SKIN IS THIN- WILL TEAR AND BRUISE EASILY!! PAPER TAPE IS TOLERATED.   Penicillins Itching         Social History   Socioeconomic History   Marital status: Single    Spouse name: Not on file   Number of children: Not on file   Years of education: Not on file   Highest education level: Not on file  Occupational History   Not on file  Tobacco Use   Smoking status: Former    Current packs/day: 1.00    Average packs/day: 1 pack/day for 63.8 years (63.8 ttl pk-yrs)    Types: Cigarettes    Start date: 01/18/1959    Passive exposure: Past   Smokeless tobacco: Never   Tobacco comments:    1 pack per day   Vaping Use   Vaping status: Never Used  Substance and Sexual Activity   Alcohol use: No   Drug use: No   Sexual activity: Not Currently  Other Topics Concern   Not on file  Social History Narrative   Not on file   Social Determinants of Health   Financial Resource Strain: Low Risk  (10/19/2022)   Overall Financial Resource Strain (CARDIA)    Difficulty of Paying Living Expenses: Not hard at all  Food Insecurity: Unknown (10/19/2022)   Hunger Vital Sign     Worried About Running Out of Food in the Last Year: Never true    Ran Out of Food in the Last Year: Patient declined  Transportation Needs: No Transportation Needs (10/19/2022)   PRAPARE - Administrator, Civil Service (Medical): No    Lack of Transportation (Non-Medical): No  Physical Activity: Insufficiently Active (10/19/2022)   Exercise Vital Sign    Days of Exercise per Week: 1 day    Minutes of Exercise per Session: 30 min  Stress: No Stress Concern Present (10/19/2022)   [employer] of Occupational Health - Occupational Stress Questionnaire    Feeling of Stress : Not at all  Social Connections: Moderately Integrated (10/19/2022)   Social Connection and Isolation Panel [NHANES]    Frequency of Communication with Friends and Family: Three times a week    Frequency of Social Gatherings with Friends and Family: Three times a week    Attends Religious Services: 1 to 4 times per year    Active Member of Clubs or Organizations: No    Attends Engineer, structural: 1 to 4 times per year    Marital Status: Separated    Family History  Problem Relation Age of Onset   Bipolar disorder Daughter    Diabetes Mother    Stroke Father    Diabetes Sister    Diabetes Brother    Diabetes Brother    Colon cancer Sister    Colon cancer Sister    Pancreatic cancer Brother    Heart attack Brother     Medications:       Current Outpatient Medications:    acetaminophen (TYLENOL) 650 MG CR tablet, Take 650 mg by mouth every 4 (four) hours as needed for pain., Disp: , Rfl:    ACIDOPHILUS LACTOBACILLUS PO, Take 1 tablet by mouth 2 (two) times daily., Disp: , Rfl:    ARIPiprazole (ABILIFY) 5 MG tablet, Take 1 tablet (5 mg total) by mouth at bedtime., Disp: 90 tablet, Rfl: 2   ascorbic acid (VITAMIN C) 500 MG tablet, Take 500 mg by mouth daily., Disp: , Rfl:    atorvastatin (LIPITOR) 40 MG tablet, Take 40 mg by mouth daily., Disp: , Rfl:    cholecalciferol (VITAMIN D3) 25 MCG  (1000 UNIT) tablet, Take 2,000 Units by mouth daily., Disp: , Rfl:    clopidogrel (PLAVIX) 75 MG tablet, Take 75 mg by mouth in the morning., Disp: , Rfl:    Cranberry 450 MG TABS, Take 450 mg by mouth 2 (two) times daily., Disp: , Rfl:    DULoxetine (CYMBALTA) 60 MG capsule, Take 1 capsule (60 mg total) by mouth daily., Disp: 30 capsule, Rfl: 3   famotidine (PEPCID) 10 MG tablet, Take 20 mg by mouth 2 (two) times daily., Disp: , Rfl:    ferrous sulfate 324 MG TBEC, Take 324 mg by mouth., Disp: , Rfl:    gabapentin (NEURONTIN) 100 MG capsule, Take 200 mg by mouth 2 (two) times daily., Disp: , Rfl:    INCRUSE ELLIPTA 62.5 MCG/ACT AEPB, Inhale 1 puff into the lungs daily., Disp: , Rfl:    ipratropium-albuterol (DUONEB) 0.5-2.5 (3) MG/3ML SOLN, Take 3 mLs by nebulization every 6 (six) hours as needed (cough/congestion)., Disp: , Rfl:    loperamide (IMODIUM) 2 MG capsule, Take 2 mg by mouth as needed for diarrhea or loose stools. Take 2 tablets after the first loose stool, then 1 tablet after subsequent loose stools, 4 tablets max in 24 hours., Disp: , Rfl:    Menthol, Topical Analgesic, (BIOFREEZE) 4 % GEL, Apply 1 Application topically every 8 (eight) hours as needed (pain)., Disp: , Rfl:  metoprolol succinate (TOPROL-XL) 25 MG 24 hr tablet, Take 25 mg by mouth daily., Disp: , Rfl:    Multiple Vitamin (MULTIVITAMIN WITH MINERALS) TABS tablet, Take 1 tablet by mouth daily., Disp: , Rfl:    Nutritional Supplements (PROMOD PO), Take 30 mLs by mouth in the morning and at bedtime., Disp: , Rfl:    ondansetron (ZOFRAN) 4 MG tablet, Take 4 mg by mouth every 8 (eight) hours as needed for nausea or vomiting., Disp: , Rfl:    oxyCODONE-acetaminophen (PERCOCET/ROXICET) 5-325 MG tablet, Take 1 tablet by mouth every 12 (twelve) hours as needed for severe pain., Disp: , Rfl:    pantoprazole (PROTONIX) 40 MG tablet, Take 1 tablet (40 mg total) by mouth daily., Disp: 30 tablet, Rfl: 11   phenazopyridine (PYRIDIUM)  100 MG tablet, Take 100 mg by mouth every 8 (eight) hours as needed for pain., Disp: , Rfl:    polyvinyl alcohol (LIQUIFILM TEARS) 1.4 % ophthalmic solution, Place 2 drops into both eyes in the morning, at noon, and at bedtime., Disp: , Rfl:    senna (SENOKOT) 8.6 MG TABS tablet, Take 2 tablets by mouth at bedtime., Disp: , Rfl:    sitaGLIPtin (JANUVIA) 100 MG tablet, Take 100 mg by mouth in the morning., Disp: , Rfl:    zinc gluconate 50 MG tablet, Take 50 mg by mouth daily., Disp: , Rfl:    OXYGEN, Inhale 2 L/min into the lungs as needed (for shortness of breath). (Patient not taking: Reported on 10/19/2022), Disp: , Rfl:   Current Facility-Administered Medications:    Gerhardt's butt cream, , Topical, PRN,   Objective Blood pressure 118/67, pulse 68.  I removed her pad and she had a depends full of poop She had stool in the vagina and I tried the best I could to differentiate between a fistula and just pressure related pushing of stool into the vagina but could not tell for sure  She does give a significant history of diverticulitis which required hospitalization in the past which makes me suspcious it could be  Serendipitously she is having a CT of abdomen and pelvis 10/23/22 with contrast which should answer the question of fistula  Pertinent ROS No burning with urination, frequency or urgency No nausea, vomiting or diarrhea Nor fever chills or other constitutional symptoms   Labs or studies     Impression + Management Plan: Diagnoses this Encounter::   ICD-10-CM   1. 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     Follow up Return if symptoms worsen or fail to improve.

## 2022-10-23 ENCOUNTER — Ambulatory Visit (HOSPITAL_COMMUNITY)
Admission: RE | Admit: 2022-10-23 | Discharge: 2022-10-23 | Disposition: A | Discharge status: Home / Self Care | Payer: Medicare Other | Source: Ambulatory Visit | Attending: Gastroenterology | Admitting: Gastroenterology

## 2022-10-23 DIAGNOSIS — R1030 Lower abdominal pain, unspecified: Secondary | ICD-10-CM | POA: Insufficient documentation

## 2022-10-23 LAB — POCT I-STAT CREATININE: Creatinine, Ser: 1.1 mg/dL — ABNORMAL HIGH (ref 0.44–1.00)

## 2022-10-23 MED ORDER — IOHEXOL 300 MG/ML  SOLN
100.0000 mL | Freq: Once | INTRAMUSCULAR | Status: AC | PRN
  Administered 2022-10-23: 100 mL via INTRAVENOUS

## 2022-11-02 DEATH — deceased
